# Patient Record
Sex: Female | Born: 1980 | Race: White | Hispanic: No | Marital: Married | Smoking: Never smoker
Health system: Southern US, Community
[De-identification: ages and names within clinical notes are randomized; demographics above are authoritative.]

## PROBLEM LIST (undated history)

## (undated) DIAGNOSIS — F32A Depression, unspecified: Secondary | ICD-10-CM

## (undated) DIAGNOSIS — Z1589 Genetic susceptibility to other disease: Secondary | ICD-10-CM

## (undated) DIAGNOSIS — R112 Nausea with vomiting, unspecified: Secondary | ICD-10-CM

## (undated) DIAGNOSIS — O321XX0 Maternal care for breech presentation, not applicable or unspecified: Secondary | ICD-10-CM

## (undated) DIAGNOSIS — Z9889 Other specified postprocedural states: Secondary | ICD-10-CM

## (undated) DIAGNOSIS — F329 Major depressive disorder, single episode, unspecified: Secondary | ICD-10-CM

## (undated) DIAGNOSIS — K649 Unspecified hemorrhoids: Secondary | ICD-10-CM

## (undated) DIAGNOSIS — J45909 Unspecified asthma, uncomplicated: Secondary | ICD-10-CM

## (undated) DIAGNOSIS — Z8619 Personal history of other infectious and parasitic diseases: Secondary | ICD-10-CM

## (undated) DIAGNOSIS — F419 Anxiety disorder, unspecified: Secondary | ICD-10-CM

## (undated) DIAGNOSIS — Z803 Family history of malignant neoplasm of breast: Secondary | ICD-10-CM

## (undated) HISTORY — DX: Family history of malignant neoplasm of breast: Z80.3

## (undated) HISTORY — PX: WISDOM TOOTH EXTRACTION: SHX21

## (undated) HISTORY — PX: COLONOSCOPY: SHX174

## (undated) HISTORY — DX: Genetic susceptibility to other disease: Z15.89

## (undated) HISTORY — PX: MOLE REMOVAL: SHX2046

---

## 2000-08-06 HISTORY — PX: CYSTECTOMY: SUR359

## 2001-06-27 ENCOUNTER — Encounter: Payer: Self-pay | Admitting: Emergency Medicine

## 2001-06-28 ENCOUNTER — Inpatient Hospital Stay (HOSPITAL_COMMUNITY): Admission: EM | Admit: 2001-06-28 | Discharge: 2001-06-29 | Payer: Self-pay | Admitting: Emergency Medicine

## 2001-07-04 ENCOUNTER — Encounter: Payer: Self-pay | Admitting: Gastroenterology

## 2001-07-04 ENCOUNTER — Ambulatory Visit (HOSPITAL_COMMUNITY): Admission: RE | Admit: 2001-07-04 | Discharge: 2001-07-04 | Payer: Self-pay | Admitting: Gastroenterology

## 2001-10-13 ENCOUNTER — Ambulatory Visit (HOSPITAL_COMMUNITY): Admission: EM | Admit: 2001-10-13 | Discharge: 2001-10-13 | Payer: Self-pay | Admitting: General Surgery

## 2005-01-28 ENCOUNTER — Ambulatory Visit (HOSPITAL_COMMUNITY): Admission: RE | Admit: 2005-01-28 | Discharge: 2005-01-28 | Payer: Self-pay | Admitting: Family Medicine

## 2005-01-28 IMAGING — CT CT ABDOMEN W/ CM
1 of 4 series · 14 of 32 positions shown, 19 images · IV contrast (APPLIED)
Comparison: none

CLINICAL DATA: abdominal and pelvic pain
 ABDOMEN CT WITH CONTRAST:
TECHNIQUE: Multidetector CT imaging of the abdomen was performed following the standard protocol during bolus administration of intravenous contrast.
 Contrast:  125 cc Omnipaque 300
TECHNIQUE: Multidetector CT imaging of the pelvis was performed following the standard protocol during bolus administration of intravenous contrast.
 The appendix is seen on images # 50 through # 55 and is normal.  A small amount of free fluid is seen layering in the pelvis.  The uterus and ovaries are within normal limits.  The bladder is within normal limits.  Negative abnormal adenopathy.

[Series 2: abd_pel 5.0 b40f st · axial · 0.62mm/px · z∈[-464,-114]mm · 14 of 80 slices shown, 19 images]
[im 5/80  soft-tissue]
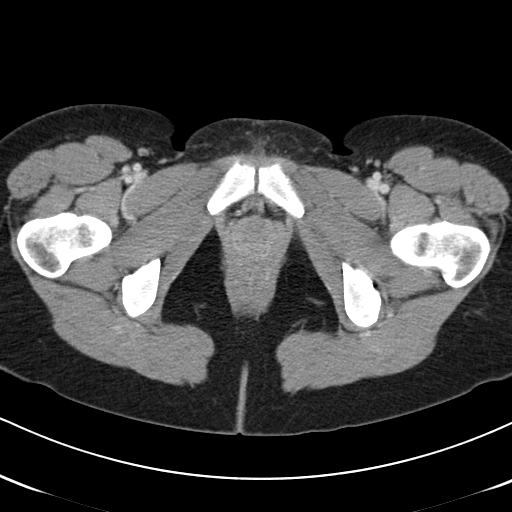
[im 5/80  bone]
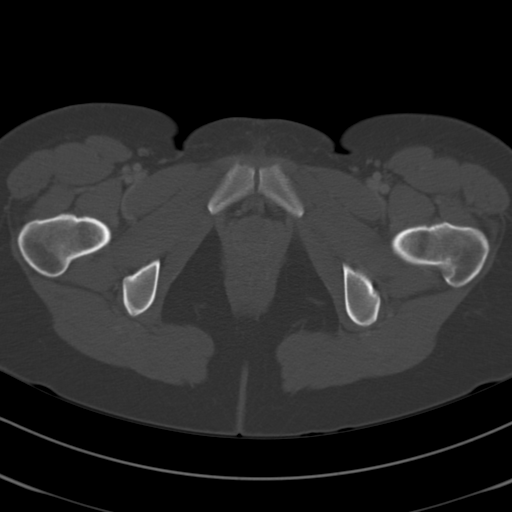
[im 9/80  soft-tissue]
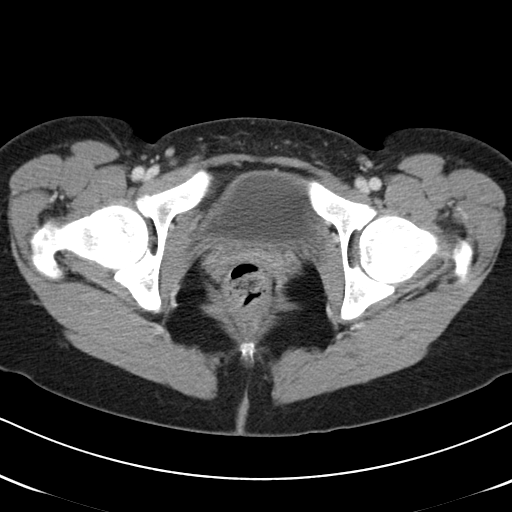
[im 18/80  soft-tissue]
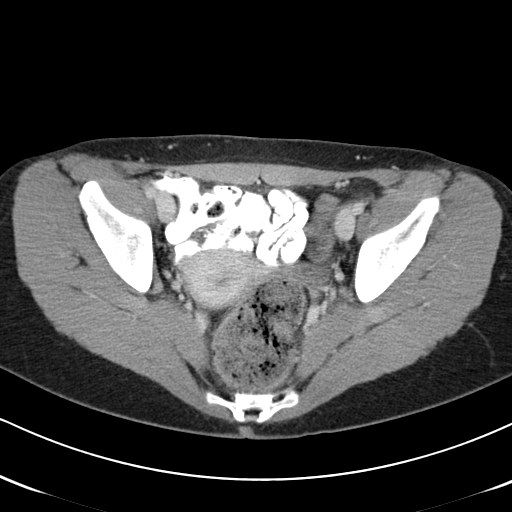
[im 22/80  soft-tissue]
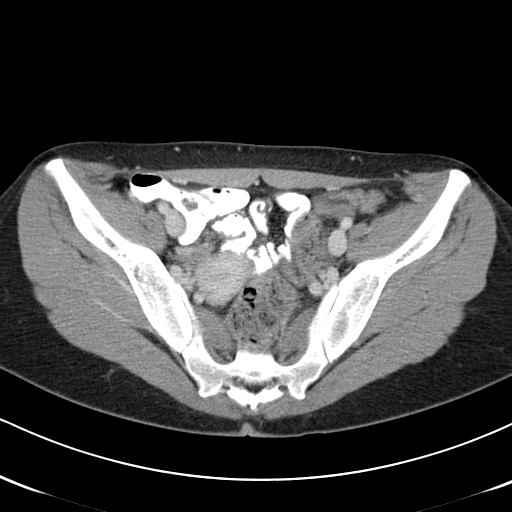
[im 27/80  soft-tissue]
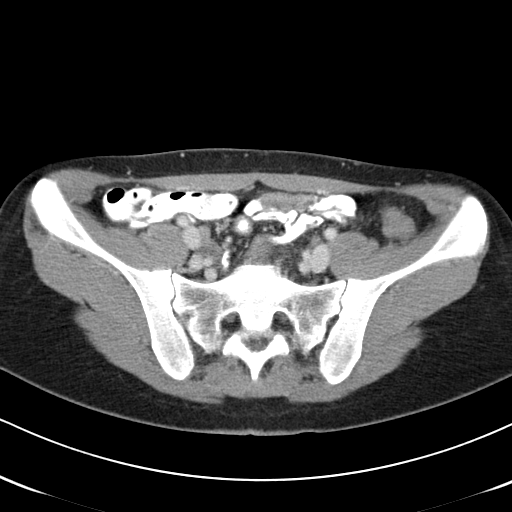
[im 36/80  soft-tissue]
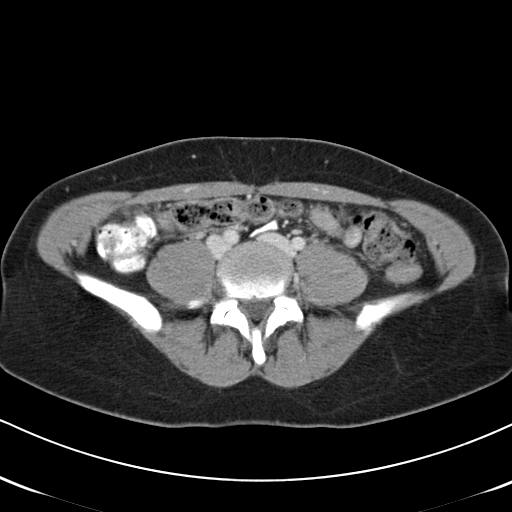
[im 40/80  soft-tissue]
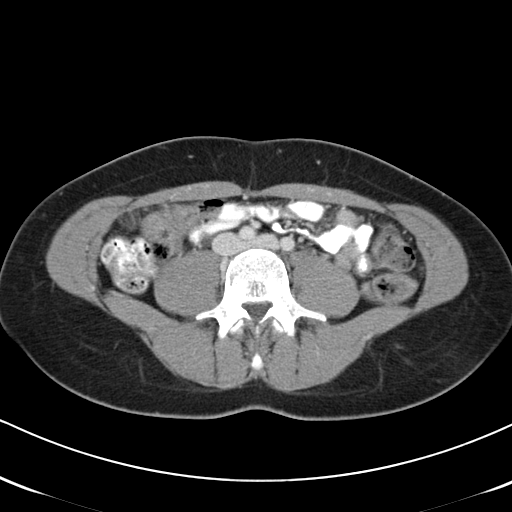
[im 44/80  soft-tissue]
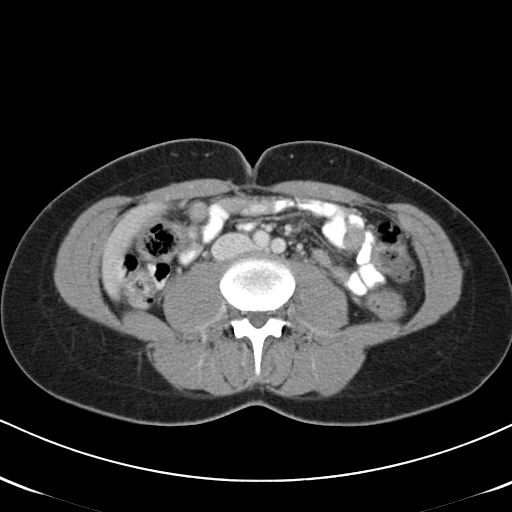
[im 53/80  soft-tissue]
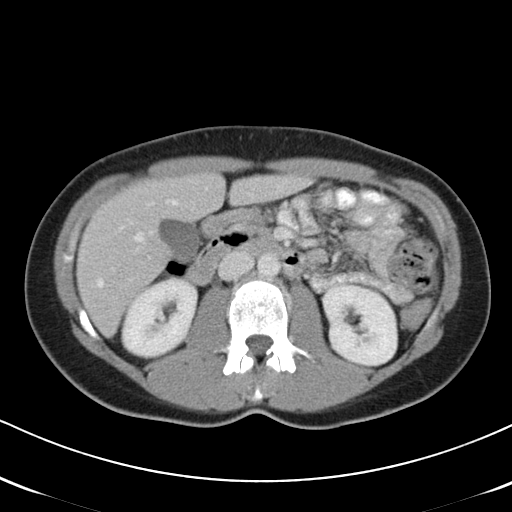
[im 53/80  bone]
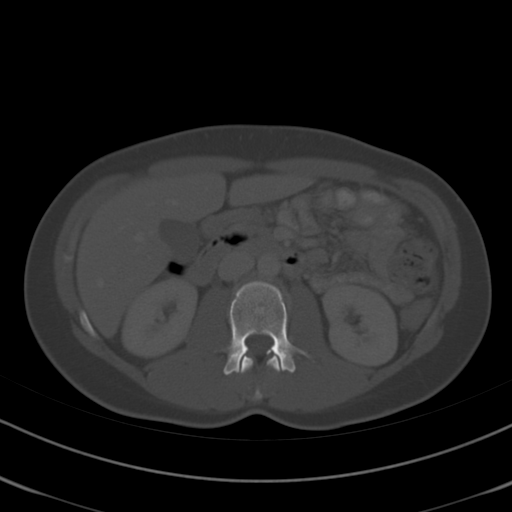
[im 58/80  soft-tissue]
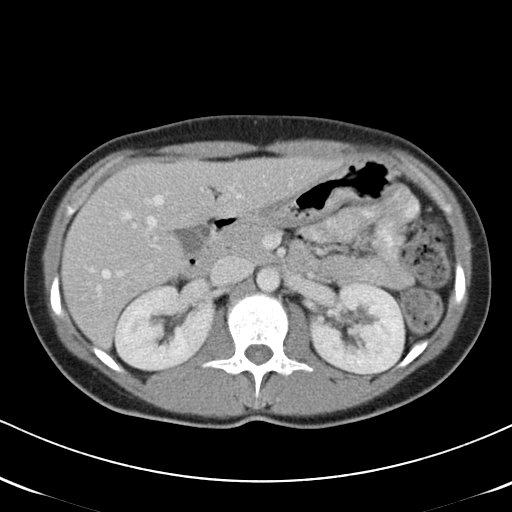
[im 62/80  soft-tissue]
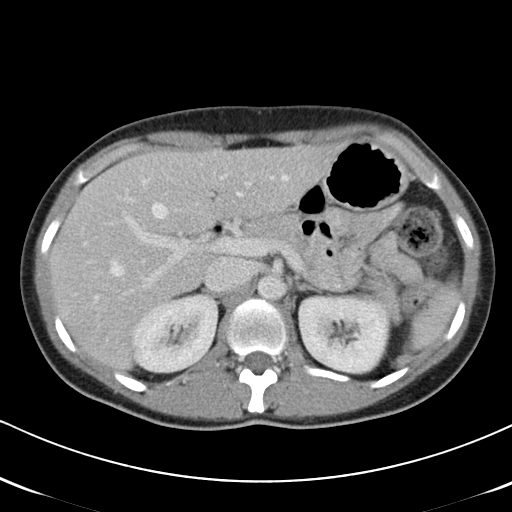
[im 62/80  lung]
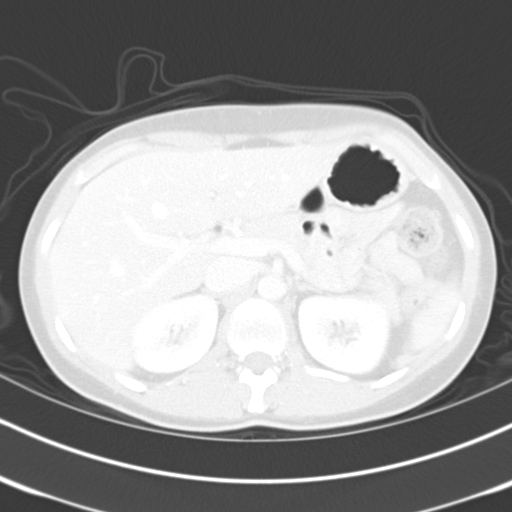
[im 66/80  lung]
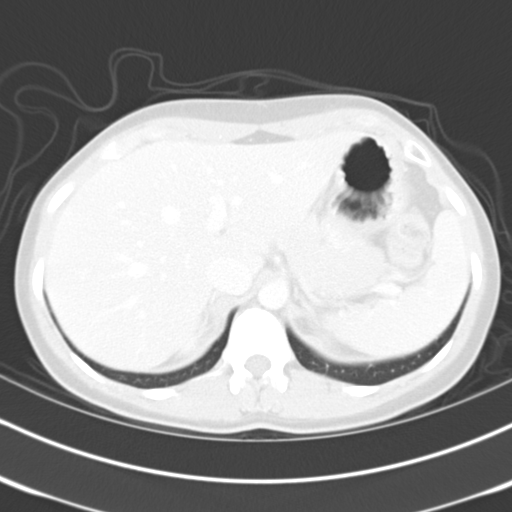
[im 71/80  soft-tissue]
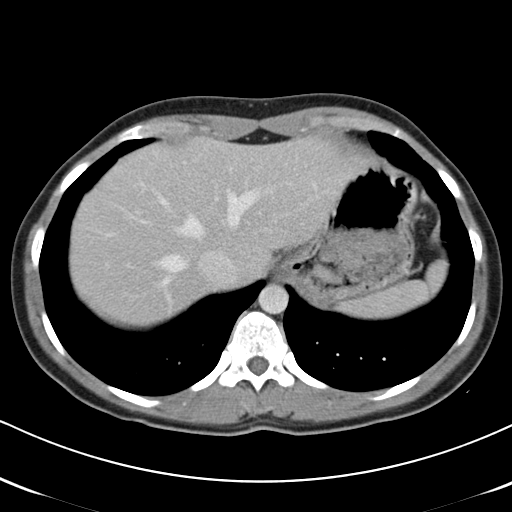
[im 71/80  lung]
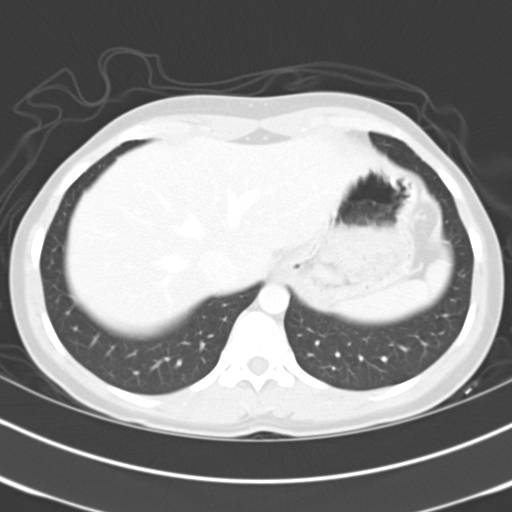
[im 75/80  soft-tissue]
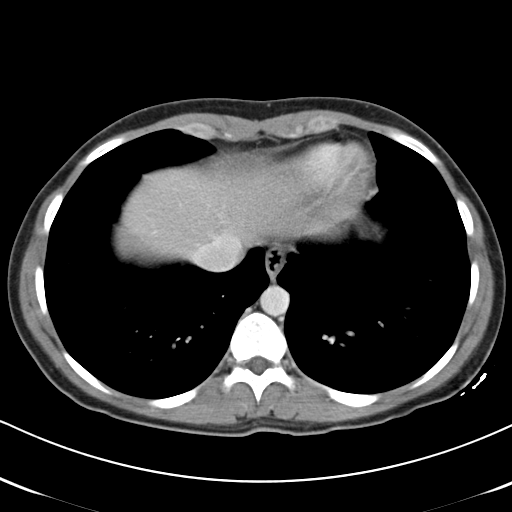
[im 75/80  lung]
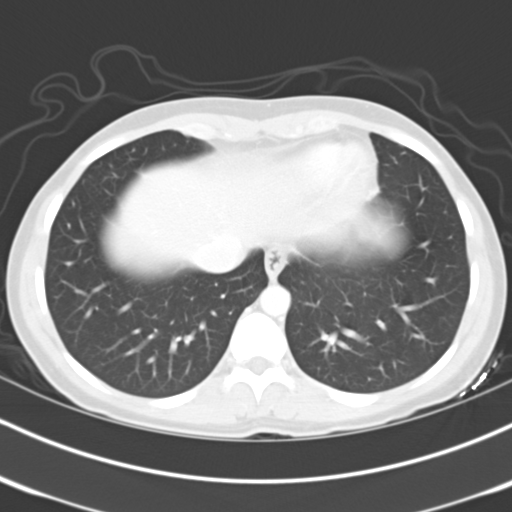

[14 of 32 positions shown; findings below may reference images not displayed]

FINDINGS: The liver, gallbladder, spleen, pancreas, adrenal glands, and kidneys are within normal limits.  Negative free fluid.  Negative abnormal adenopathy.  There is stranding in the fat adjacent to a segment of the transverse colon in the right side of the abdomen.  There is also a focal area of wall thickening.  See images # 41, # 42 and # 43.
IMPRESSION: Focal inflammation of a segment of the proximal transverse colon.  Consider infectious or inflammatory etiology such as infectious colitis, or inflammatory bowel disease.  Less likely would be ischemia and neoplasm.  Because there is a possibility of neoplasm, followup CT is recommended once symptoms resolve.   An additional consideration is gastroepiploic appendagitis. 
 PELVIS CT WITH CONTRAST:
IMPRESSION: Normal appendix.  Trace amount of free fluid which may simply be physiologic.

## 2005-02-13 ENCOUNTER — Other Ambulatory Visit: Admission: RE | Admit: 2005-02-13 | Discharge: 2005-02-13 | Payer: Self-pay | Admitting: *Deleted

## 2006-03-05 ENCOUNTER — Other Ambulatory Visit: Admission: RE | Admit: 2006-03-05 | Discharge: 2006-03-05 | Payer: Self-pay | Admitting: *Deleted

## 2006-10-18 IMAGING — CT CT HEAD W/O CM
1 of 2 series · 13 of 30 positions shown, 17 images · non-contrast
Comparison: NONE

CLINICAL DATA: Frequent headaches times 6 weeks. 

CT HEAD WITHOUT AND WITH INTRAVENOUS CONTRAST
TECHNIQUE: Axial 5-mm slice thicknesses were obtained through 
the head before the administration of intravenous contrast.  Axial 
5-mm thick slices were obtained through the posterior fossa and 
dual 8s were obtained through the remaining portion of the head 
following the administration of contrast.

[Series 2: without contrast · axial · non-contrast · 0.44mm/px · z∈[+96,+233]mm · 13 of 32 slices shown, 17 images]
[im 3/32  brain]
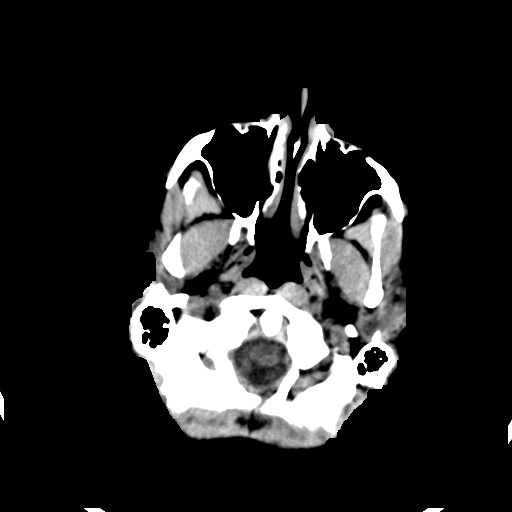
[im 3/32  bone]
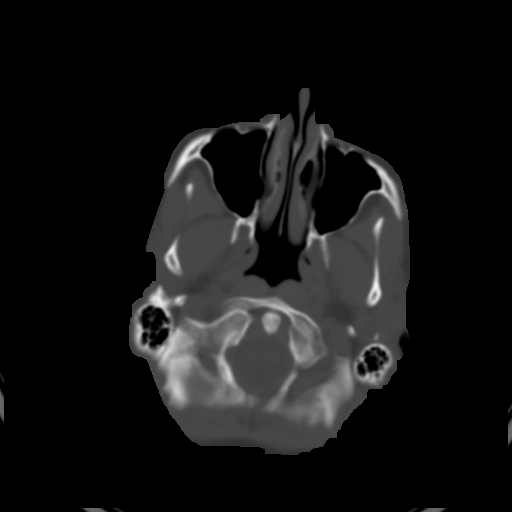
[im 5/32  brain]
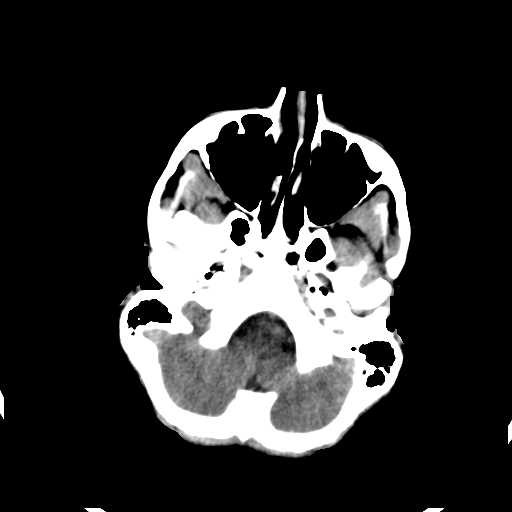
[im 7/32  brain]
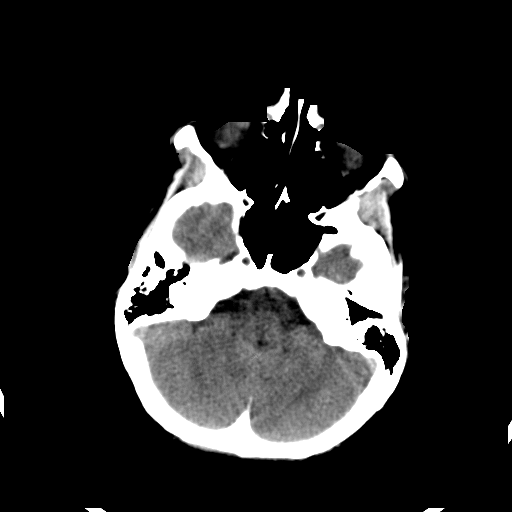
[im 9/32  brain]
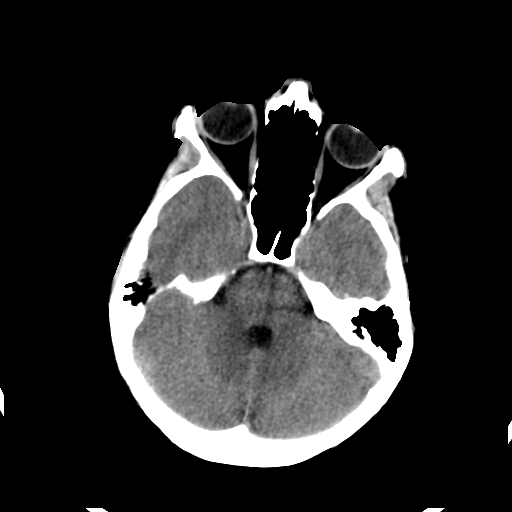
[im 12/32  brain]
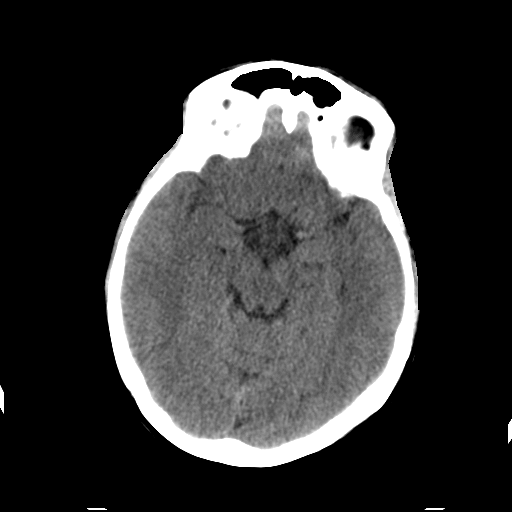
[im 12/32  bone]
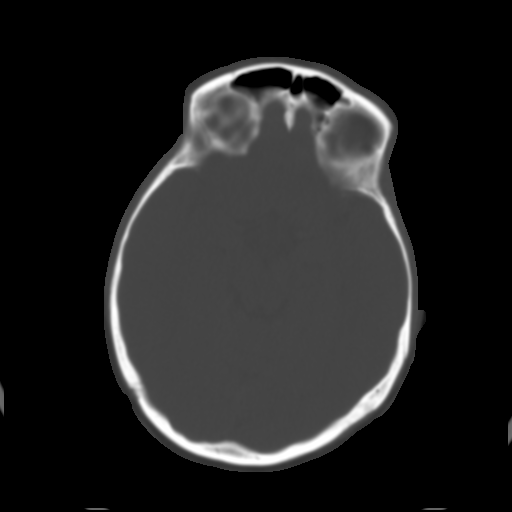
[im 14/32  brain]
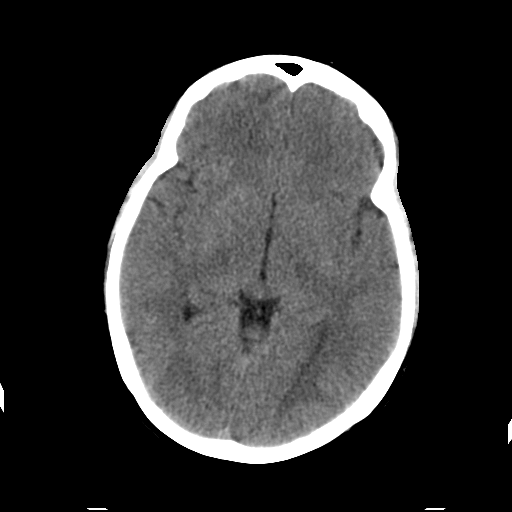
[im 16/32  brain]
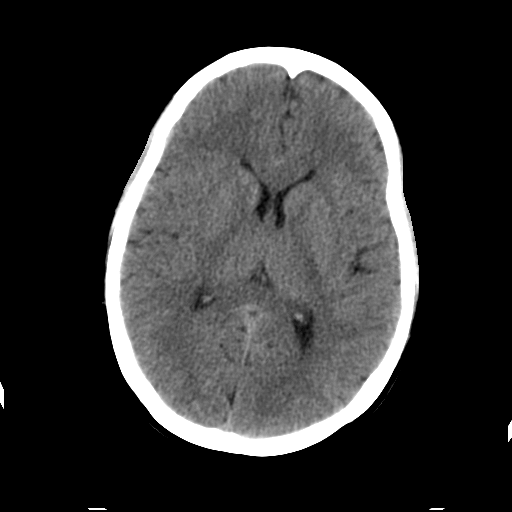
[im 18/32  brain]
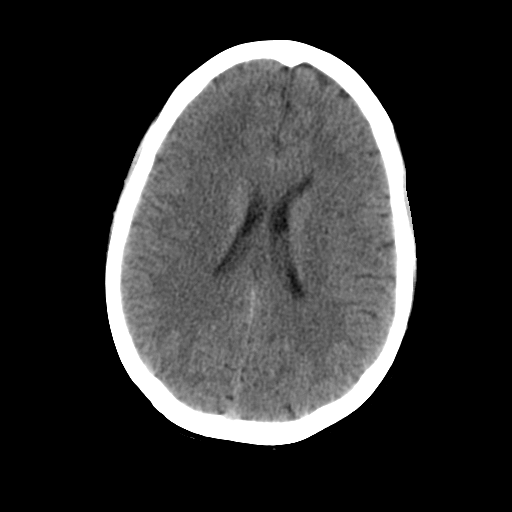
[im 20/32  brain]
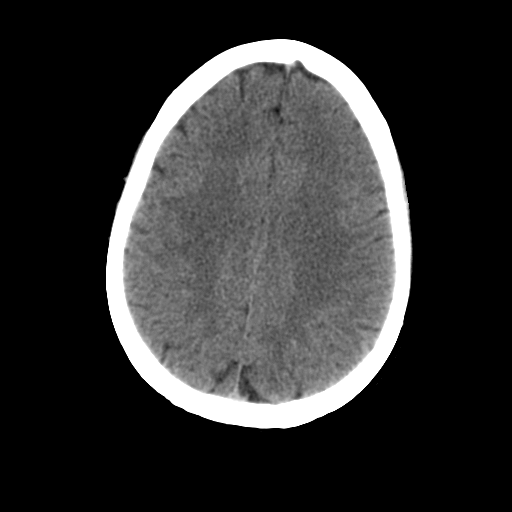
[im 20/32  bone]
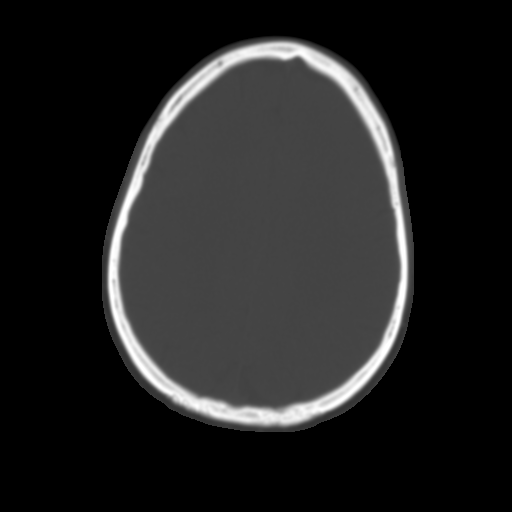
[im 23/32  brain]
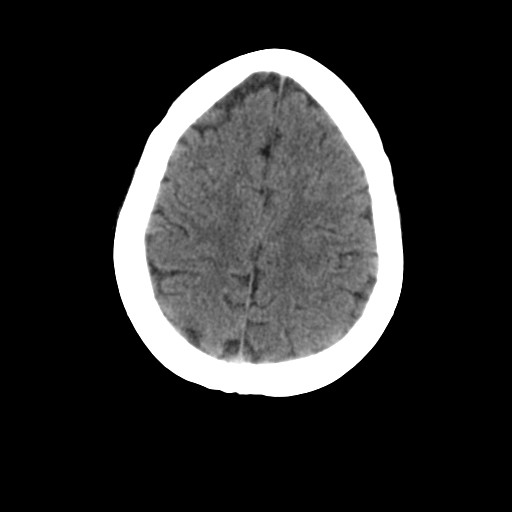
[im 25/32  brain]
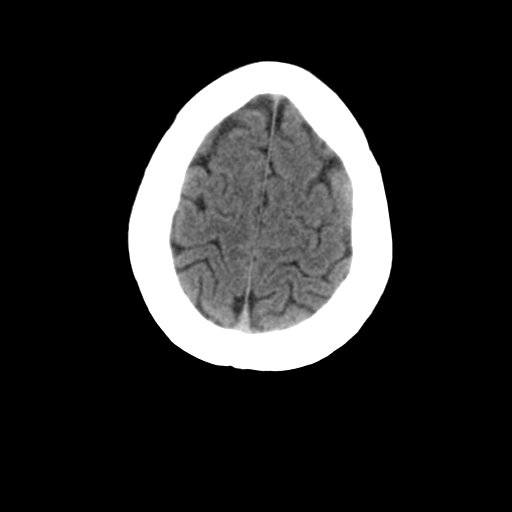
[im 27/32  brain]
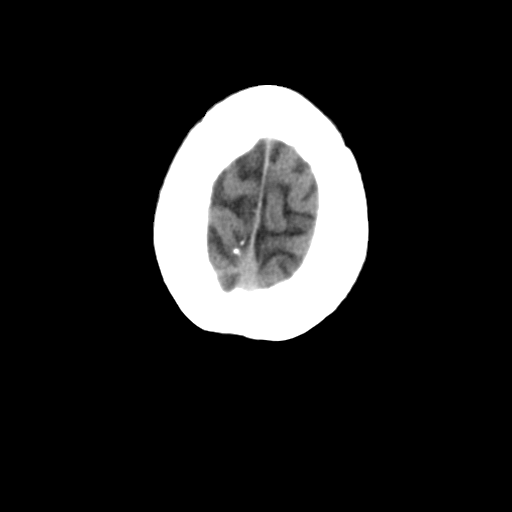
[im 29/32  brain]
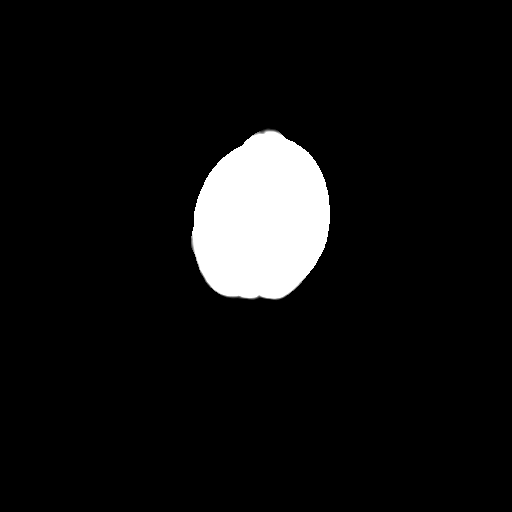
[im 29/32  bone]
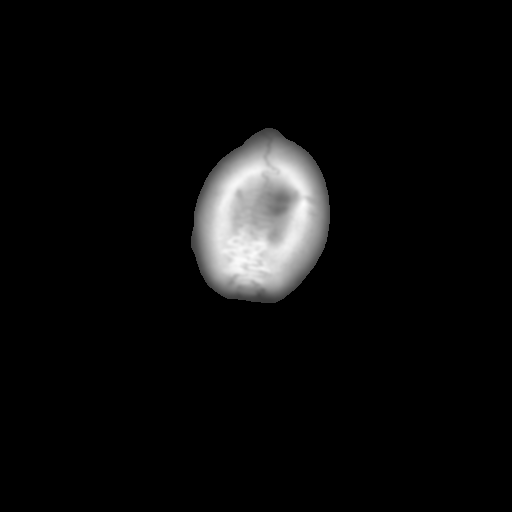

[13 of 30 positions shown; findings below may reference images not displayed]

FINDINGS: The visualized portions of the paranasal sinuses, 
orbits and mastoids are unremarkable in appearance. The fourth, 
third and both lateral ventricles are identified.  There is no 
evidence of midline shift or mass effect infratentorially or 
supratentorially.  No areas of abnormal radiolucency or 
radiodensity are identified. No areas of abnormal enhancement are 
noted following contrast infusion. 

electronically reviewed on [DATE] Dict Date: [DATE]  Tran 
Date: [DATE] DAS  JLM

## 2011-05-30 ENCOUNTER — Ambulatory Visit: Payer: Self-pay | Attending: Family Medicine | Admitting: Physical Therapy

## 2011-05-30 DIAGNOSIS — M2560 Stiffness of unspecified joint, not elsewhere classified: Secondary | ICD-10-CM | POA: Insufficient documentation

## 2011-05-30 DIAGNOSIS — M545 Low back pain, unspecified: Secondary | ICD-10-CM | POA: Insufficient documentation

## 2011-05-30 DIAGNOSIS — M542 Cervicalgia: Secondary | ICD-10-CM | POA: Insufficient documentation

## 2011-05-30 DIAGNOSIS — IMO0001 Reserved for inherently not codable concepts without codable children: Secondary | ICD-10-CM | POA: Insufficient documentation

## 2011-06-04 ENCOUNTER — Ambulatory Visit: Payer: Self-pay | Admitting: Physical Therapy

## 2011-06-05 ENCOUNTER — Encounter: Payer: Self-pay | Admitting: Physical Therapy

## 2011-06-05 ENCOUNTER — Ambulatory Visit: Payer: Self-pay | Admitting: Physical Therapy

## 2011-06-07 ENCOUNTER — Ambulatory Visit: Payer: BC Managed Care – PPO | Attending: Family Medicine | Admitting: Physical Therapy

## 2011-06-07 DIAGNOSIS — M545 Low back pain, unspecified: Secondary | ICD-10-CM | POA: Insufficient documentation

## 2011-06-07 DIAGNOSIS — M542 Cervicalgia: Secondary | ICD-10-CM | POA: Insufficient documentation

## 2011-06-07 DIAGNOSIS — M2560 Stiffness of unspecified joint, not elsewhere classified: Secondary | ICD-10-CM | POA: Insufficient documentation

## 2011-06-07 DIAGNOSIS — IMO0001 Reserved for inherently not codable concepts without codable children: Secondary | ICD-10-CM | POA: Insufficient documentation

## 2011-06-11 ENCOUNTER — Ambulatory Visit: Payer: BC Managed Care – PPO | Admitting: Physical Therapy

## 2011-06-12 ENCOUNTER — Ambulatory Visit: Payer: BC Managed Care – PPO | Admitting: Physical Therapy

## 2011-06-14 ENCOUNTER — Ambulatory Visit: Payer: BC Managed Care – PPO | Admitting: Physical Therapy

## 2011-06-18 ENCOUNTER — Ambulatory Visit: Payer: BC Managed Care – PPO | Admitting: Physical Therapy

## 2011-06-19 ENCOUNTER — Ambulatory Visit: Payer: BC Managed Care – PPO | Admitting: Physical Therapy

## 2011-06-21 ENCOUNTER — Ambulatory Visit: Payer: BC Managed Care – PPO | Admitting: Physical Therapy

## 2011-06-25 ENCOUNTER — Ambulatory Visit: Payer: BC Managed Care – PPO | Admitting: Physical Therapy

## 2011-06-26 ENCOUNTER — Ambulatory Visit: Payer: BC Managed Care – PPO | Admitting: Physical Therapy

## 2011-07-04 ENCOUNTER — Ambulatory Visit: Payer: BC Managed Care – PPO | Admitting: Physical Therapy

## 2011-07-06 ENCOUNTER — Encounter: Payer: BC Managed Care – PPO | Admitting: Physical Therapy

## 2011-07-09 ENCOUNTER — Ambulatory Visit: Payer: BC Managed Care – PPO | Attending: Family Medicine | Admitting: Physical Therapy

## 2011-07-09 DIAGNOSIS — IMO0001 Reserved for inherently not codable concepts without codable children: Secondary | ICD-10-CM | POA: Insufficient documentation

## 2011-07-09 DIAGNOSIS — M545 Low back pain, unspecified: Secondary | ICD-10-CM | POA: Insufficient documentation

## 2011-07-09 DIAGNOSIS — M542 Cervicalgia: Secondary | ICD-10-CM | POA: Insufficient documentation

## 2011-07-09 DIAGNOSIS — M2560 Stiffness of unspecified joint, not elsewhere classified: Secondary | ICD-10-CM | POA: Insufficient documentation

## 2011-07-11 ENCOUNTER — Ambulatory Visit: Payer: BC Managed Care – PPO | Admitting: Physical Therapy

## 2011-09-03 ENCOUNTER — Ambulatory Visit: Payer: BC Managed Care – PPO | Attending: Family Medicine | Admitting: Physical Therapy

## 2011-09-03 DIAGNOSIS — M545 Low back pain, unspecified: Secondary | ICD-10-CM | POA: Insufficient documentation

## 2011-09-03 DIAGNOSIS — M542 Cervicalgia: Secondary | ICD-10-CM | POA: Insufficient documentation

## 2011-09-03 DIAGNOSIS — IMO0001 Reserved for inherently not codable concepts without codable children: Secondary | ICD-10-CM | POA: Insufficient documentation

## 2011-09-03 DIAGNOSIS — M2560 Stiffness of unspecified joint, not elsewhere classified: Secondary | ICD-10-CM | POA: Insufficient documentation

## 2011-09-06 ENCOUNTER — Ambulatory Visit: Payer: BC Managed Care – PPO | Admitting: Physical Therapy

## 2011-09-11 ENCOUNTER — Ambulatory Visit: Payer: BC Managed Care – PPO | Attending: Family Medicine | Admitting: Physical Therapy

## 2011-09-11 DIAGNOSIS — M2560 Stiffness of unspecified joint, not elsewhere classified: Secondary | ICD-10-CM | POA: Insufficient documentation

## 2011-09-11 DIAGNOSIS — M542 Cervicalgia: Secondary | ICD-10-CM | POA: Insufficient documentation

## 2011-09-11 DIAGNOSIS — M545 Low back pain, unspecified: Secondary | ICD-10-CM | POA: Insufficient documentation

## 2011-09-11 DIAGNOSIS — IMO0001 Reserved for inherently not codable concepts without codable children: Secondary | ICD-10-CM | POA: Insufficient documentation

## 2011-09-13 ENCOUNTER — Encounter: Payer: BC Managed Care – PPO | Admitting: Physical Therapy

## 2011-09-14 ENCOUNTER — Ambulatory Visit: Payer: BC Managed Care – PPO | Admitting: Physical Therapy

## 2011-09-18 ENCOUNTER — Ambulatory Visit: Payer: BC Managed Care – PPO | Admitting: Physical Therapy

## 2011-09-20 ENCOUNTER — Ambulatory Visit: Payer: BC Managed Care – PPO | Admitting: Physical Therapy

## 2011-09-25 ENCOUNTER — Ambulatory Visit: Payer: BC Managed Care – PPO | Admitting: Physical Therapy

## 2011-09-27 ENCOUNTER — Ambulatory Visit: Payer: BC Managed Care – PPO | Admitting: Physical Therapy

## 2012-02-06 ENCOUNTER — Other Ambulatory Visit: Payer: Self-pay | Admitting: Family Medicine

## 2012-02-06 ENCOUNTER — Other Ambulatory Visit (HOSPITAL_COMMUNITY)
Admission: RE | Admit: 2012-02-06 | Discharge: 2012-02-06 | Disposition: A | Discharge status: Home / Self Care | Payer: BC Managed Care – PPO | Source: Ambulatory Visit | Attending: Family Medicine | Admitting: Family Medicine

## 2012-02-06 DIAGNOSIS — Z1159 Encounter for screening for other viral diseases: Secondary | ICD-10-CM | POA: Insufficient documentation

## 2012-02-06 DIAGNOSIS — Z124 Encounter for screening for malignant neoplasm of cervix: Secondary | ICD-10-CM | POA: Insufficient documentation

## 2014-06-16 ENCOUNTER — Ambulatory Visit: Payer: BC Managed Care – PPO | Attending: Family Medicine

## 2014-06-16 DIAGNOSIS — R5381 Other malaise: Secondary | ICD-10-CM | POA: Diagnosis not present

## 2014-06-16 DIAGNOSIS — M542 Cervicalgia: Secondary | ICD-10-CM | POA: Insufficient documentation

## 2014-06-16 DIAGNOSIS — M797 Fibromyalgia: Secondary | ICD-10-CM | POA: Insufficient documentation

## 2014-06-18 ENCOUNTER — Ambulatory Visit: Payer: BC Managed Care – PPO

## 2014-06-18 DIAGNOSIS — M797 Fibromyalgia: Secondary | ICD-10-CM | POA: Diagnosis not present

## 2014-06-21 ENCOUNTER — Ambulatory Visit: Payer: BC Managed Care – PPO

## 2014-06-21 DIAGNOSIS — M797 Fibromyalgia: Secondary | ICD-10-CM | POA: Diagnosis not present

## 2014-06-23 ENCOUNTER — Ambulatory Visit: Payer: BC Managed Care – PPO | Admitting: Physical Therapy

## 2014-06-23 DIAGNOSIS — M797 Fibromyalgia: Secondary | ICD-10-CM | POA: Diagnosis not present

## 2014-06-28 ENCOUNTER — Ambulatory Visit: Payer: BC Managed Care – PPO

## 2014-06-28 DIAGNOSIS — M797 Fibromyalgia: Secondary | ICD-10-CM | POA: Diagnosis not present

## 2014-07-05 ENCOUNTER — Ambulatory Visit: Payer: BC Managed Care – PPO

## 2014-07-05 DIAGNOSIS — M797 Fibromyalgia: Secondary | ICD-10-CM | POA: Diagnosis not present

## 2014-07-07 ENCOUNTER — Ambulatory Visit: Payer: BC Managed Care – PPO | Attending: Family Medicine

## 2014-07-07 DIAGNOSIS — M542 Cervicalgia: Secondary | ICD-10-CM | POA: Insufficient documentation

## 2014-07-07 DIAGNOSIS — R5381 Other malaise: Secondary | ICD-10-CM | POA: Diagnosis not present

## 2014-07-07 DIAGNOSIS — M797 Fibromyalgia: Secondary | ICD-10-CM | POA: Insufficient documentation

## 2014-07-12 ENCOUNTER — Ambulatory Visit: Payer: BC Managed Care – PPO | Admitting: Physical Therapy

## 2014-07-12 DIAGNOSIS — M797 Fibromyalgia: Secondary | ICD-10-CM | POA: Diagnosis not present

## 2014-07-14 ENCOUNTER — Ambulatory Visit: Payer: BC Managed Care – PPO

## 2014-07-14 DIAGNOSIS — M797 Fibromyalgia: Secondary | ICD-10-CM | POA: Diagnosis not present

## 2014-07-19 ENCOUNTER — Ambulatory Visit: Payer: BC Managed Care – PPO | Admitting: Physical Therapy

## 2014-07-19 DIAGNOSIS — M797 Fibromyalgia: Secondary | ICD-10-CM | POA: Diagnosis not present

## 2014-07-21 ENCOUNTER — Ambulatory Visit: Payer: BC Managed Care – PPO

## 2014-07-21 DIAGNOSIS — M797 Fibromyalgia: Secondary | ICD-10-CM | POA: Diagnosis not present

## 2014-07-26 ENCOUNTER — Ambulatory Visit: Payer: BC Managed Care – PPO

## 2014-07-26 DIAGNOSIS — M797 Fibromyalgia: Secondary | ICD-10-CM | POA: Diagnosis not present

## 2014-07-28 ENCOUNTER — Ambulatory Visit: Payer: BC Managed Care – PPO

## 2014-07-28 DIAGNOSIS — M797 Fibromyalgia: Secondary | ICD-10-CM | POA: Diagnosis not present

## 2014-08-02 ENCOUNTER — Ambulatory Visit: Payer: BC Managed Care – PPO | Admitting: Physical Therapy

## 2014-08-02 DIAGNOSIS — M797 Fibromyalgia: Secondary | ICD-10-CM | POA: Diagnosis not present

## 2014-08-04 ENCOUNTER — Ambulatory Visit: Payer: BC Managed Care – PPO

## 2014-08-04 DIAGNOSIS — M797 Fibromyalgia: Secondary | ICD-10-CM | POA: Diagnosis not present

## 2015-07-06 LAB — OB RESULTS CONSOLE HEPATITIS B SURFACE ANTIGEN: Hepatitis B Surface Ag: NEGATIVE

## 2015-07-06 LAB — OB RESULTS CONSOLE ANTIBODY SCREEN: ANTIBODY SCREEN: NEGATIVE

## 2015-07-06 LAB — OB RESULTS CONSOLE ABO/RH: RH TYPE: NEGATIVE

## 2015-07-06 LAB — OB RESULTS CONSOLE GC/CHLAMYDIA
Chlamydia: NEGATIVE
GC PROBE AMP, GENITAL: NEGATIVE

## 2015-07-06 LAB — OB RESULTS CONSOLE RUBELLA ANTIBODY, IGM: Rubella: IMMUNE

## 2015-07-06 LAB — OB RESULTS CONSOLE HIV ANTIBODY (ROUTINE TESTING): HIV: NONREACTIVE

## 2015-07-06 LAB — OB RESULTS CONSOLE RPR: RPR: NONREACTIVE

## 2015-12-28 LAB — OB RESULTS CONSOLE GBS: GBS: NEGATIVE

## 2016-01-13 ENCOUNTER — Encounter (HOSPITAL_COMMUNITY): Payer: Self-pay

## 2016-01-16 ENCOUNTER — Other Ambulatory Visit: Payer: Self-pay | Admitting: Obstetrics & Gynecology

## 2016-01-17 ENCOUNTER — Encounter (HOSPITAL_COMMUNITY)
Admission: RE | Admit: 2016-01-17 | Discharge: 2016-01-17 | Disposition: A | Discharge status: Home / Self Care | Payer: BLUE CROSS/BLUE SHIELD | Source: Ambulatory Visit | Attending: Obstetrics & Gynecology | Admitting: Obstetrics & Gynecology

## 2016-01-17 ENCOUNTER — Encounter (INDEPENDENT_AMBULATORY_CARE_PROVIDER_SITE_OTHER): Payer: Self-pay

## 2016-01-17 HISTORY — DX: Unspecified asthma, uncomplicated: J45.909

## 2016-01-17 HISTORY — DX: Personal history of other infectious and parasitic diseases: Z86.19

## 2016-01-17 HISTORY — DX: Major depressive disorder, single episode, unspecified: F32.9

## 2016-01-17 HISTORY — DX: Depression, unspecified: F32.A

## 2016-01-17 LAB — TYPE AND SCREEN
ABO/RH(D): O NEG
Antibody Screen: NEGATIVE

## 2016-01-17 LAB — CBC
HEMATOCRIT: 35.6 % — AB (ref 36.0–46.0)
HEMOGLOBIN: 12.3 g/dL (ref 12.0–15.0)
MCH: 32.1 pg (ref 26.0–34.0)
MCHC: 34.6 g/dL (ref 30.0–36.0)
MCV: 93 fL (ref 78.0–100.0)
PLATELETS: 300 10*3/uL (ref 150–400)
RBC: 3.83 MIL/uL — ABNORMAL LOW (ref 3.87–5.11)
RDW: 14.2 % (ref 11.5–15.5)
WBC: 15 10*3/uL — ABNORMAL HIGH (ref 4.0–10.5)

## 2016-01-17 LAB — ABO/RH: ABO/RH(D): O NEG

## 2016-01-17 NOTE — Patient Instructions (Addendum)
20 Roe CoombsJennifer R Santoni  01/17/2016   Your procedure is scheduled on:  01/18/2016  [facility]30 [facility].  Pick up the phone at the desk and dial [phone].   Call this number if you have problems the morning of surgery: [phone]   Remember:   Do not eat food:After Midnight.  Do not drink clear liquids: 4 Hours before arrival.  Take these medicines the morning of surgery with A SIP OF WATER: Singulair, Please bring your inhaler with you.   Do not wear jewelry, make-up or nail polish.  Do not wear lotions, powders, or perfumes. You may wear deodorant.  Do not shave 48 hours prior to surgery.  Do not bring valuables to the hospital.  [facility] not   responsible for any belongings or valuables brought to the hospital.  Contacts, dentures or bridgework may not be worn into surgery.  Leave suitcase in the car. After surgery it may be brought to your room.  For patients admitted to the hospital, checkout time is 11:00 AM the day of              discharge.   Patients discharged the day of surgery will not be allowed to drive             home.  Name and phone number of your driver: na  Special Instructions:   Shower using CHG 2 nights before surgery and the night before surgery.  If you shower the day of surgery use CHG.  Use special wash - you have one bottle of CHG for all showers.  You should use approximately 1/3 of the bottle for each shower.   Please read over the following fact sheets that you were given:   Surgical Site Infection Prevention

## 2016-01-18 ENCOUNTER — Inpatient Hospital Stay (HOSPITAL_COMMUNITY): Payer: BLUE CROSS/BLUE SHIELD | Admitting: Anesthesiology

## 2016-01-18 ENCOUNTER — Encounter (HOSPITAL_COMMUNITY)
Admission: AD | Disposition: A | Discharge status: Home / Self Care | Payer: Self-pay | Source: Ambulatory Visit | Attending: Obstetrics & Gynecology

## 2016-01-18 ENCOUNTER — Encounter (HOSPITAL_COMMUNITY): Payer: Self-pay

## 2016-01-18 ENCOUNTER — Inpatient Hospital Stay (HOSPITAL_COMMUNITY)
Admission: AD | Admit: 2016-01-18 | Discharge: 2016-01-21 | DRG: 765 | Disposition: A | Discharge status: Home / Self Care | Payer: BLUE CROSS/BLUE SHIELD | Source: Ambulatory Visit | Attending: Obstetrics & Gynecology | Admitting: Obstetrics & Gynecology

## 2016-01-18 DIAGNOSIS — O26893 Other specified pregnancy related conditions, third trimester: Secondary | ICD-10-CM | POA: Diagnosis present

## 2016-01-18 DIAGNOSIS — Z6791 Unspecified blood type, Rh negative: Secondary | ICD-10-CM

## 2016-01-18 DIAGNOSIS — O321XX0 Maternal care for breech presentation, not applicable or unspecified: Principal | ICD-10-CM | POA: Diagnosis present

## 2016-01-18 DIAGNOSIS — O9081 Anemia of the puerperium: Secondary | ICD-10-CM | POA: Diagnosis not present

## 2016-01-18 DIAGNOSIS — F329 Major depressive disorder, single episode, unspecified: Secondary | ICD-10-CM | POA: Diagnosis present

## 2016-01-18 DIAGNOSIS — D62 Acute posthemorrhagic anemia: Secondary | ICD-10-CM | POA: Diagnosis not present

## 2016-01-18 DIAGNOSIS — J45909 Unspecified asthma, uncomplicated: Secondary | ICD-10-CM | POA: Diagnosis present

## 2016-01-18 DIAGNOSIS — Z8249 Family history of ischemic heart disease and other diseases of the circulatory system: Secondary | ICD-10-CM | POA: Diagnosis not present

## 2016-01-18 DIAGNOSIS — Z9889 Other specified postprocedural states: Secondary | ICD-10-CM

## 2016-01-18 DIAGNOSIS — O9952 Diseases of the respiratory system complicating childbirth: Secondary | ICD-10-CM | POA: Diagnosis present

## 2016-01-18 DIAGNOSIS — Z3A39 39 weeks gestation of pregnancy: Secondary | ICD-10-CM | POA: Diagnosis not present

## 2016-01-18 HISTORY — DX: Other specified postprocedural states: R11.2

## 2016-01-18 HISTORY — DX: Other specified postprocedural states: Z98.890

## 2016-01-18 LAB — RPR: RPR: NONREACTIVE

## 2016-01-18 SURGERY — Surgical Case
Anesthesia: Spinal

## 2016-01-18 MED ORDER — MONTELUKAST SODIUM 10 MG PO TABS
10.0000 mg | ORAL_TABLET | Freq: Every day | ORAL | Status: DC
  Administered 2016-01-19 – 2016-01-20 (×3): 10 mg via ORAL
  Filled 2016-01-18 (×4): qty 1

## 2016-01-18 MED ORDER — SOD CITRATE-CITRIC ACID 500-334 MG/5ML PO SOLN
ORAL | Status: AC
  Filled 2016-01-18: qty 15

## 2016-01-18 MED ORDER — LORATADINE 10 MG PO TABS
10.0000 mg | ORAL_TABLET | Freq: Every day | ORAL | Status: DC
  Administered 2016-01-19 – 2016-01-20 (×3): 10 mg via ORAL
  Filled 2016-01-18 (×4): qty 1

## 2016-01-18 MED ORDER — FLUTICASONE PROPIONATE 50 MCG/ACT NA SUSP
2.0000 | Freq: Every day | NASAL | Status: DC
  Administered 2016-01-19 – 2016-01-20 (×3): 2 via NASAL
  Filled 2016-01-18: qty 16

## 2016-01-18 MED ORDER — KETOROLAC TROMETHAMINE 30 MG/ML IJ SOLN
INTRAMUSCULAR | Status: AC
  Filled 2016-01-18: qty 1

## 2016-01-18 MED ORDER — MOMETASONE FURO-FORMOTEROL FUM 100-5 MCG/ACT IN AERO
2.0000 | INHALATION_SPRAY | Freq: Two times a day (BID) | RESPIRATORY_TRACT | Status: DC
  Administered 2016-01-19 – 2016-01-21 (×6): 2 via RESPIRATORY_TRACT
  Filled 2016-01-18: qty 8.8

## 2016-01-18 MED ORDER — ONDANSETRON HCL 4 MG/2ML IJ SOLN
INTRAMUSCULAR | Status: AC
  Filled 2016-01-18: qty 2

## 2016-01-18 MED ORDER — WITCH HAZEL-GLYCERIN EX PADS: 1.0000 "application " | MEDICATED_PAD | CUTANEOUS | Status: DC | PRN

## 2016-01-18 MED ORDER — MORPHINE SULFATE (PF) 0.5 MG/ML IJ SOLN
INTRAMUSCULAR | Status: AC
  Filled 2016-01-18: qty 10

## 2016-01-18 MED ORDER — BUPIVACAINE HCL 0.25 % IJ SOLN
INTRAMUSCULAR | Status: DC | PRN
  Administered 2016-01-18: 10 mL

## 2016-01-18 MED ORDER — ZOLPIDEM TARTRATE 5 MG PO TABS: 5.0000 mg | ORAL_TABLET | Freq: Every evening | ORAL | Status: DC | PRN

## 2016-01-18 MED ORDER — LACTATED RINGERS IV SOLN
INTRAVENOUS | Status: DC | PRN
  Administered 2016-01-18: 15:00:00 via INTRAVENOUS

## 2016-01-18 MED ORDER — PHENYLEPHRINE 40 MCG/ML (10ML) SYRINGE FOR IV PUSH (FOR BLOOD PRESSURE SUPPORT)
PREFILLED_SYRINGE | INTRAVENOUS | Status: AC
  Filled 2016-01-18: qty 10

## 2016-01-18 MED ORDER — NALOXONE HCL 0.4 MG/ML IJ SOLN: 0.4000 mg | INTRAMUSCULAR | Status: DC | PRN

## 2016-01-18 MED ORDER — TETANUS-DIPHTH-ACELL PERTUSSIS 5-2.5-18.5 LF-MCG/0.5 IM SUSP: 0.5000 mL | Freq: Once | INTRAMUSCULAR | Status: DC

## 2016-01-18 MED ORDER — PROMETHAZINE HCL 25 MG/ML IJ SOLN: 6.2500 mg | INTRAMUSCULAR | Status: DC | PRN

## 2016-01-18 MED ORDER — OXYTOCIN 10 UNIT/ML IJ SOLN
INTRAMUSCULAR | Status: AC
  Filled 2016-01-18: qty 4

## 2016-01-18 MED ORDER — SENNOSIDES-DOCUSATE SODIUM 8.6-50 MG PO TABS
2.0000 | ORAL_TABLET | ORAL | Status: DC
  Administered 2016-01-18 – 2016-01-21 (×3): 2 via ORAL
  Filled 2016-01-18 (×3): qty 2

## 2016-01-18 MED ORDER — SCOPOLAMINE 1 MG/3DAYS TD PT72
1.0000 | MEDICATED_PATCH | Freq: Once | TRANSDERMAL | Status: DC
  Administered 2016-01-18: 1.5 mg via TRANSDERMAL

## 2016-01-18 MED ORDER — MEPERIDINE HCL 25 MG/ML IJ SOLN: 6.2500 mg | INTRAMUSCULAR | Status: DC | PRN

## 2016-01-18 MED ORDER — MENTHOL 3 MG MT LOZG: 1.0000 | LOZENGE | OROMUCOSAL | Status: DC | PRN

## 2016-01-18 MED ORDER — BUPIVACAINE IN DEXTROSE 0.75-8.25 % IT SOLN
INTRATHECAL | Status: DC | PRN
  Administered 2016-01-18: 1.6 mL via INTRATHECAL

## 2016-01-18 MED ORDER — FENTANYL CITRATE (PF) 100 MCG/2ML IJ SOLN
INTRAMUSCULAR | Status: AC
  Filled 2016-01-18: qty 2

## 2016-01-18 MED ORDER — ONDANSETRON HCL 4 MG/2ML IJ SOLN: 4.0000 mg | Freq: Three times a day (TID) | INTRAMUSCULAR | Status: DC | PRN

## 2016-01-18 MED ORDER — ACETAMINOPHEN 325 MG PO TABS
650.0000 mg | ORAL_TABLET | ORAL | Status: DC | PRN
  Administered 2016-01-19 – 2016-01-21 (×8): 650 mg via ORAL
  Filled 2016-01-18 (×8): qty 2

## 2016-01-18 MED ORDER — MAGNESIUM HYDROXIDE 400 MG/5ML PO SUSP: 30.0000 mL | ORAL | Status: DC | PRN

## 2016-01-18 MED ORDER — NALBUPHINE HCL 10 MG/ML IJ SOLN: 5.0000 mg | INTRAMUSCULAR | Status: DC | PRN

## 2016-01-18 MED ORDER — DEXAMETHASONE SODIUM PHOSPHATE 4 MG/ML IJ SOLN
INTRAMUSCULAR | Status: AC
  Filled 2016-01-18: qty 1

## 2016-01-18 MED ORDER — FENTANYL CITRATE (PF) 100 MCG/2ML IJ SOLN
25.0000 ug | INTRAMUSCULAR | Status: DC | PRN
  Administered 2016-01-18: 25 ug via INTRAVENOUS

## 2016-01-18 MED ORDER — EPHEDRINE SULFATE 50 MG/ML IJ SOLN
INTRAMUSCULAR | Status: DC | PRN
  Administered 2016-01-18 (×2): 10 mg via INTRAVENOUS

## 2016-01-18 MED ORDER — NALBUPHINE HCL 10 MG/ML IJ SOLN: 5.0000 mg | Freq: Once | INTRAMUSCULAR | Status: DC | PRN

## 2016-01-18 MED ORDER — OXYCODONE HCL 5 MG PO TABS
5.0000 mg | ORAL_TABLET | ORAL | Status: DC | PRN
  Administered 2016-01-19 (×2): 5 mg via ORAL
  Filled 2016-01-18 (×2): qty 1

## 2016-01-18 MED ORDER — DIBUCAINE 1 % RE OINT: 1.0000 "application " | TOPICAL_OINTMENT | RECTAL | Status: DC | PRN

## 2016-01-18 MED ORDER — METOCLOPRAMIDE HCL 5 MG/ML IJ SOLN
INTRAMUSCULAR | Status: DC | PRN
  Administered 2016-01-18: 10 mg via INTRAVENOUS

## 2016-01-18 MED ORDER — ACETAMINOPHEN 500 MG PO TABS
1000.0000 mg | ORAL_TABLET | Freq: Four times a day (QID) | ORAL | Status: AC
  Administered 2016-01-18 – 2016-01-19 (×3): 1000 mg via ORAL
  Filled 2016-01-18 (×3): qty 2

## 2016-01-18 MED ORDER — PHENYLEPHRINE HCL 10 MG/ML IJ SOLN
INTRAMUSCULAR | Status: DC | PRN
  Administered 2016-01-18 (×2): 120 ug via INTRAVENOUS
  Administered 2016-01-18: 40 ug via INTRAVENOUS
  Administered 2016-01-18 (×3): 80 ug via INTRAVENOUS

## 2016-01-18 MED ORDER — SOD CITRATE-CITRIC ACID 500-334 MG/5ML PO SOLN
30.0000 mL | Freq: Once | ORAL | Status: AC
  Administered 2016-01-18: 30 mL via ORAL

## 2016-01-18 MED ORDER — LACTATED RINGERS IV SOLN
INTRAVENOUS | Status: DC
  Administered 2016-01-18: 23:00:00 via INTRAVENOUS

## 2016-01-18 MED ORDER — KETOROLAC TROMETHAMINE 30 MG/ML IJ SOLN
30.0000 mg | Freq: Four times a day (QID) | INTRAMUSCULAR | Status: DC | PRN
  Administered 2016-01-18: 30 mg via INTRAMUSCULAR

## 2016-01-18 MED ORDER — SIMETHICONE 80 MG PO CHEW: 80.0000 mg | CHEWABLE_TABLET | ORAL | Status: DC | PRN

## 2016-01-18 MED ORDER — SIMETHICONE 80 MG PO CHEW
80.0000 mg | CHEWABLE_TABLET | ORAL | Status: DC
  Administered 2016-01-18 – 2016-01-21 (×3): 80 mg via ORAL
  Filled 2016-01-18 (×3): qty 1

## 2016-01-18 MED ORDER — KETOROLAC TROMETHAMINE 30 MG/ML IJ SOLN: 30.0000 mg | Freq: Four times a day (QID) | INTRAMUSCULAR | Status: DC | PRN

## 2016-01-18 MED ORDER — MORPHINE SULFATE (PF) 0.5 MG/ML IJ SOLN
INTRAMUSCULAR | Status: DC | PRN
  Administered 2016-01-18: .2 mg via INTRATHECAL

## 2016-01-18 MED ORDER — OXYTOCIN 40 UNITS IN LACTATED RINGERS INFUSION - SIMPLE MED: 2.5000 [IU]/h | INTRAVENOUS | Status: DC

## 2016-01-18 MED ORDER — SCOPOLAMINE 1 MG/3DAYS TD PT72
MEDICATED_PATCH | TRANSDERMAL | Status: AC
  Filled 2016-01-18: qty 1

## 2016-01-18 MED ORDER — BUPIVACAINE HCL (PF) 0.25 % IJ SOLN
INTRAMUSCULAR | Status: AC
  Filled 2016-01-18: qty 20

## 2016-01-18 MED ORDER — OXYTOCIN 40 UNITS IN LACTATED RINGERS INFUSION - SIMPLE MED
INTRAVENOUS | Status: DC | PRN
  Administered 2016-01-18: 40 [IU] via INTRAVENOUS

## 2016-01-18 MED ORDER — PHENYLEPHRINE 8 MG IN D5W 100 ML (0.08MG/ML) PREMIX OPTIME
INJECTION | INTRAVENOUS | Status: AC
  Filled 2016-01-18: qty 100

## 2016-01-18 MED ORDER — IBUPROFEN 600 MG PO TABS
600.0000 mg | ORAL_TABLET | Freq: Four times a day (QID) | ORAL | Status: DC
  Administered 2016-01-18 – 2016-01-19 (×2): 600 mg via ORAL
  Filled 2016-01-18 (×2): qty 1

## 2016-01-18 MED ORDER — SIMETHICONE 80 MG PO CHEW
80.0000 mg | CHEWABLE_TABLET | Freq: Three times a day (TID) | ORAL | Status: DC
  Administered 2016-01-19 – 2016-01-21 (×8): 80 mg via ORAL
  Filled 2016-01-18 (×8): qty 1

## 2016-01-18 MED ORDER — ONDANSETRON HCL 4 MG/2ML IJ SOLN
INTRAMUSCULAR | Status: DC | PRN
  Administered 2016-01-18: 4 mg via INTRAVENOUS

## 2016-01-18 MED ORDER — CEFAZOLIN SODIUM-DEXTROSE 2-4 GM/100ML-% IV SOLN
2.0000 g | INTRAVENOUS | Status: AC
  Administered 2016-01-18: 2 g via INTRAVENOUS

## 2016-01-18 MED ORDER — NALOXONE HCL 2 MG/2ML IJ SOSY
1.0000 ug/kg/h | PREFILLED_SYRINGE | INTRAVENOUS | Status: DC | PRN
  Filled 2016-01-18: qty 2

## 2016-01-18 MED ORDER — PRENATAL MULTIVITAMIN CH
1.0000 | ORAL_TABLET | Freq: Every day | ORAL | Status: DC
  Administered 2016-01-19 – 2016-01-21 (×3): 1 via ORAL
  Filled 2016-01-18 (×3): qty 1

## 2016-01-18 MED ORDER — COCONUT OIL OIL
1.0000 "application " | TOPICAL_OIL | Status: DC | PRN
  Administered 2016-01-21: 1 via TOPICAL
  Filled 2016-01-18: qty 120

## 2016-01-18 MED ORDER — DIPHENHYDRAMINE HCL 25 MG PO CAPS: 25.0000 mg | ORAL_CAPSULE | Freq: Four times a day (QID) | ORAL | Status: DC | PRN

## 2016-01-18 MED ORDER — FENTANYL CITRATE (PF) 100 MCG/2ML IJ SOLN
INTRAMUSCULAR | Status: DC | PRN
  Administered 2016-01-18: 10 ug via INTRATHECAL

## 2016-01-18 MED ORDER — FERROUS SULFATE 325 (65 FE) MG PO TABS
325.0000 mg | ORAL_TABLET | Freq: Two times a day (BID) | ORAL | Status: DC
  Administered 2016-01-19: 325 mg via ORAL
  Filled 2016-01-18: qty 1

## 2016-01-18 MED ORDER — DIPHENHYDRAMINE HCL 50 MG/ML IJ SOLN: 12.5000 mg | INTRAMUSCULAR | Status: DC | PRN

## 2016-01-18 MED ORDER — DEXAMETHASONE SODIUM PHOSPHATE 4 MG/ML IJ SOLN
INTRAMUSCULAR | Status: DC | PRN
  Administered 2016-01-18: 4 mg via INTRAVENOUS

## 2016-01-18 MED ORDER — LACTATED RINGERS IV SOLN
INTRAVENOUS | Status: DC
  Administered 2016-01-18 (×3): via INTRAVENOUS

## 2016-01-18 MED ORDER — PHENYLEPHRINE 8 MG IN D5W 100 ML (0.08MG/ML) PREMIX OPTIME
INJECTION | INTRAVENOUS | Status: DC | PRN
  Administered 2016-01-18: 60 ug/min via INTRAVENOUS

## 2016-01-18 MED ORDER — DIPHENHYDRAMINE HCL 25 MG PO CAPS: 25.0000 mg | ORAL_CAPSULE | ORAL | Status: DC | PRN

## 2016-01-18 MED ORDER — SODIUM CHLORIDE 0.9% FLUSH: 3.0000 mL | INTRAVENOUS | Status: DC | PRN

## 2016-01-18 MED ORDER — METOCLOPRAMIDE HCL 5 MG/ML IJ SOLN
INTRAMUSCULAR | Status: AC
  Filled 2016-01-18: qty 2

## 2016-01-18 SURGICAL SUPPLY — 42 items
ADH SKN CLS LQ APL DERMABOND (GAUZE/BANDAGES/DRESSINGS) ×1
CLAMP CORD UMBIL (MISCELLANEOUS) IMPLANT
CLOTH BEACON ORANGE TIMEOUT ST (SAFETY) ×3 IMPLANT
CONTAINER PREFILL 10% NBF 15ML (MISCELLANEOUS) IMPLANT
DERMABOND ADHESIVE PROPEN (GAUZE/BANDAGES/DRESSINGS) ×2
DERMABOND ADVANCED .7 DNX6 (GAUZE/BANDAGES/DRESSINGS) IMPLANT
DRSG OPSITE POSTOP 4X10 (GAUZE/BANDAGES/DRESSINGS) ×3 IMPLANT
DURAPREP 26ML APPLICATOR (WOUND CARE) ×3 IMPLANT
ELECT REM PT RETURN 9FT ADLT (ELECTROSURGICAL) ×3
ELECTRODE REM PT RTRN 9FT ADLT (ELECTROSURGICAL) ×1 IMPLANT
EXTRACTOR VACUUM M CUP 4 TUBE (SUCTIONS) IMPLANT
EXTRACTOR VACUUM M CUP 4' TUBE (SUCTIONS)
GLOVE BIO SURGEON STRL SZ 6.5 (GLOVE) ×2 IMPLANT
GLOVE BIO SURGEONS STRL SZ 6.5 (GLOVE) ×1
GLOVE BIOGEL PI IND STRL 7.0 (GLOVE) ×2 IMPLANT
GLOVE BIOGEL PI INDICATOR 7.0 (GLOVE) ×4
GOWN STRL REUS W/TWL LRG LVL3 (GOWN DISPOSABLE) ×6 IMPLANT
KIT ABG SYR 3ML LUER SLIP (SYRINGE) IMPLANT
LIQUID BAND (GAUZE/BANDAGES/DRESSINGS) IMPLANT
NDL HYPO 25X5/8 SAFETYGLIDE (NEEDLE) IMPLANT
NEEDLE HYPO 22GX1.5 SAFETY (NEEDLE) ×3 IMPLANT
NEEDLE HYPO 25X5/8 SAFETYGLIDE (NEEDLE) IMPLANT
PACK C SECTION WH (CUSTOM PROCEDURE TRAY) ×3 IMPLANT
PAD OB MATERNITY 4.3X12.25 (PERSONAL CARE ITEMS) ×3 IMPLANT
RTRCTR C-SECT PINK 25CM LRG (MISCELLANEOUS) ×3 IMPLANT
SUT MNCRL AB 3-0 PS2 27 (SUTURE) IMPLANT
SUT MON AB 4-0 PS1 27 (SUTURE) IMPLANT
SUT PLAIN 0 NONE (SUTURE) IMPLANT
SUT PLAIN 2 0 (SUTURE) ×3
SUT PLAIN ABS 2-0 CT1 27XMFL (SUTURE) ×1 IMPLANT
SUT VIC AB 0 CT1 27 (SUTURE) ×6
SUT VIC AB 0 CT1 27XBRD ANBCTR (SUTURE) ×2 IMPLANT
SUT VIC AB 0 CTX 36 (SUTURE) ×6
SUT VIC AB 0 CTX36XBRD ANBCTRL (SUTURE) ×2 IMPLANT
SUT VIC AB 2-0 CT1 27 (SUTURE) ×3
SUT VIC AB 2-0 CT1 TAPERPNT 27 (SUTURE) ×1 IMPLANT
SUT VIC AB 3-0 SH 27 (SUTURE)
SUT VIC AB 3-0 SH 27X BRD (SUTURE) IMPLANT
SUT VIC AB 4-0 PS2 27 (SUTURE) IMPLANT
SYR CONTROL 10ML LL (SYRINGE) ×3 IMPLANT
TOWEL OR 17X24 6PK STRL BLUE (TOWEL DISPOSABLE) ×3 IMPLANT
TRAY FOLEY CATH SILVER 14FR (SET/KITS/TRAYS/PACK) ×3 IMPLANT

## 2016-01-18 NOTE — Op Note (Signed)
Preoperative diagnosis: Intrauterine pregnancy at 39 weeks                                            Breech presentation  Post operative diagnosis: Same  Anesthesia: Spinal  Anesthesiologist: Dr. Collene SchlichterStephen E. Turk  Procedure: Primary low transverse cesarean section  Surgeon: Dr. Genia DelMarie-Lyne Shamyah Stantz  Assistant: Denton Meekolita Dawson   Estimated blood loss: 700 cc  Procedure:  After being informed of the planned procedure and possible complications including bleeding, infection, injury to other organs, informed consent is obtained. The patient is taken to OR #7 and given spinal anesthesia without complication. She is placed in the dorsal decubitus position with the pelvis tilted to the left. She is then prepped and draped in a sterile fashion. A Foley catheter is inserted in her bladder.  After assessing adequate level of anesthesia, we infiltrate the suprapubic area with 20 cc of Marcaine 0.25 and perform a Pfannenstiel incision which is brought down sharply to the fascia. The fascia is entered in a low transverse fashion. Linea alba is dissected. Peritoneum is entered in a midline fashion. An [name] retractor is easily positioned. Visceral peritoneum is entered in a low transverse fashion allowing us to safely retract bladder by developing a bladder flap.  The myometrium is then entered in a low transverse fashion; first with knife and then extended bluntly. Amniotic fluid is clear. We assist the birth of a female  infant in complete breech presentation. Mouth and nose are suctioned. The baby is delivered. The cord is clamped and sectioned. The baby is given to the neonatologist present in the room.  10 cc of blood is drawn from the umbilical vein.The placenta is allowed to deliver spontaneously. It is complete and the cord has 3 vessels. Uterine revision is negative.  We proceed with closure of the myometrium in 2 layers: First with a running locked suture of 0 Vicryl, then with a Lembert suture of  0 Vicryl imbricating the first one. Hemostasis is completed with cauterization on peritoneal edges.  Both paracolic gutters are cleaned. Both tubes and ovaries are assessed and normal. The pelvis is profusely irrigated with warm saline to confirm a satisfactory hemostasis.  Retractors and sponges are removed. Under fascia hemostasis is completed with cauterization.  The parietal peritoneum is closed with a running suture of Vicryl 2-0.  The fascia is then closed with 2 running sutures of 0 Vicryl meeting midline. The wound is irrigated with warm saline and hemostasis is completed with cauterization.  The adipose tissue is approximated with Plain 2-0. The skin is closed with a subcuticular suture of 3-0 Monocryl and Dermabond.  A Honeycomb dressing is applied.  Instrument and sponge count is complete x2. Estimated blood loss is 700 cc.  The procedure is well tolerated by the patient who is taken to recovery room in a well and stable condition.  female baby was born at 15:13 and received an Apgar of 8  at 1 minute and 9 at 5 minutes. Weight was pending.   Specimen: Placenta sent to [facility]   Kathleene Bergemann,MARIE-LYNE MD 6/14/20174:01 PM

## 2016-01-18 NOTE — Progress Notes (Signed)
Mom states she was taking medications for allergies nightly at home.  No home meds ordered.  Notified Dr. Seymour BarsLavoie and was told to "reorder all home meds."

## 2016-01-18 NOTE — Anesthesia Preprocedure Evaluation (Addendum)
Anesthesia Evaluation  Patient identified by MRN, date of birth, ID band Patient awake    Reviewed: Allergy & Precautions, NPO status , Patient's Chart, lab work & pertinent test results  History of Anesthesia Complications (+) PONV and history of anesthetic complications  Airway Mallampati: II  TM Distance: >3 FB Neck ROM: Full    Dental  (+) Teeth Intact, Dental Advisory Given   Pulmonary asthma ,    Pulmonary exam normal breath sounds clear to auscultation       Cardiovascular Exercise Tolerance: Good negative cardio ROS Normal cardiovascular exam Rhythm:Regular Rate:Normal     Neuro/Psych PSYCHIATRIC DISORDERS Depression negative neurological ROS     GI/Hepatic negative GI ROS, Neg liver ROS,   Endo/Other  negative endocrine ROSObesity   Renal/GU negative Renal ROS     Musculoskeletal negative musculoskeletal ROS (+)   Abdominal   Peds  Hematology negative hematology ROS (+) Plt 300k   Anesthesia Other Findings Day of surgery medications reviewed with the patient.  Reproductive/Obstetrics (+) Pregnancy                            Anesthesia Physical Anesthesia Plan  ASA: II  Anesthesia Plan: Spinal   Post-op Pain Management:    Induction:   Airway Management Planned:   Additional Equipment:   Intra-op Plan:   Post-operative Plan:   Informed Consent: I have reviewed the patients History and Physical, chart, labs and discussed the procedure including the risks, benefits and alternatives for the proposed anesthesia with the patient or authorized representative who has indicated his/her understanding and acceptance.   Dental advisory given  Plan Discussed with: CRNA, Anesthesiologist and Surgeon  Anesthesia Plan Comments: (Discussed risks and benefits of and differences between spinal and general. Discussed risks of spinal including headache, backache, failure, bleeding,  infection, and nerve damage. Patient consents to spinal. Questions answered. Coagulation studies and platelet count acceptable.)        Anesthesia Quick Evaluation

## 2016-01-18 NOTE — H&P (Signed)
Roe CoombsJennifer R Fitzmaurice is an [age] y.o. female G1P0  Term pregnancy with breech presentation  Pertinent Gynecological History:  Blood transfusions: none Sexually transmitted diseases: no past history Last pap: normal  OB History: G1P0   Menstrual History:  Patient's last menstrual period was 04/20/2015.    Past Medical History  Diagnosis Date  . Depression   . Asthma   . Hx of varicella   . PONV (postoperative nausea and vomiting)     "little nauseated"    Past Surgical History  Procedure Laterality Date  . Wisdom tooth extraction    . Cystectomy  2002    Family History  Problem Relation Age of Onset  . Cancer Maternal Grandmother     stomach  . Hypothyroidism Paternal Grandmother   . Atrial fibrillation Paternal Grandmother   . Angina Paternal Grandfather   . Hypertension Paternal Grandfather   . Hyperlipidemia Paternal Grandfather     Social History:  reports that she has never smoked. She has never used smokeless tobacco. She reports that she does not drink alcohol or use illicit drugs.  Allergies: No Known Allergies  Prescriptions prior to admission  Medication Sig Dispense Refill Last Dose  . acetaminophen (TYLENOL) 500 MG tablet Take 1,000 mg by mouth every 6 (six) hours as needed for moderate pain.   01/18/2016 at 0800  . budesonide (RHINOCORT ALLERGY) 32 MCG/ACT nasal spray Place 2 sprays into both nostrils daily.   01/17/2016 at Unknown time  . cetirizine (ZYRTEC) 10 MG tablet Take 10 mg by mouth daily.   01/17/2016 at Unknown time  . docusate sodium (COLACE) 100 MG capsule Take 100-200 mg by mouth daily as needed for mild constipation.   01/17/2016 at Unknown time  . doxylamine, Sleep, (UNISOM) 25 MG tablet Take 25 mg by mouth at bedtime as needed for sleep.   01/17/2016 at Unknown time  . lansoprazole (PREVACID) 15 MG capsule Take 15 mg by mouth daily at 12 noon.   01/18/2016 at 0900  . levalbuterol (XOPENEX HFA) 45 MCG/ACT inhaler Inhale 2 puffs into the lungs  every 6 (six) hours as needed for wheezing.   Past Month at Unknown time  . mometasone-formoterol (DULERA) 100-5 MCG/ACT AERO Inhale 2 puffs into the lungs 2 (two) times daily.   01/18/2016 at 1000  . montelukast (SINGULAIR) 10 MG tablet Take 10 mg by mouth at bedtime.   01/17/2016 at Unknown time  . NON FORMULARY Allergy shots once every 2 weeks   Past Week at Unknown time  . Prenatal Vit-Fe Fumarate-FA (PRENATAL MULTIVITAMIN) TABS tablet Take 3 tablets by mouth daily at 12 noon.   01/17/2016 at Unknown time  . hydrOXYzine (ATARAX/VISTARIL) 25 MG tablet Take 1-3 tablets by mouth daily as needed for itching.   3 Unknown at Unknown time    ROS Neg except a pubic sebaceous gland abcess.  Culture done.  Blood pressure 141/82, pulse 95, temperature 98.2 F (36.8 C), temperature source Oral, resp. rate 16, last menstrual period 04/20/2015, SpO2 100 %. Physical Exam   Breech presentation    Assessment/Plan: Term pregnancy with breech presentation for primary C/S.  Surgery and risks reviewed. Pubic sebaceous gland abcess.  Culture done.  Keileigh Vahey,MARIE-LYNE 01/18/2016, 2:29 PM

## 2016-01-18 NOTE — Anesthesia Procedure Notes (Signed)
Spinal Patient location during procedure: OR Staffing Anesthesiologist: Cecile HearingURK, Andi Mahaffy EDWARD Performed by: anesthesiologist  Preanesthetic Checklist Completed: patient identified, surgical consent, pre-op evaluation, timeout performed, IV checked, risks and benefits discussed and monitors and equipment checked Spinal Block Patient position: sitting Prep: site prepped and draped and DuraPrep Patient monitoring: continuous pulse ox and blood pressure Approach: midline Location: L3-4 Needle Needle type: Pencan  Needle gauge: 25 G Assessment Sensory level: T4 Additional Notes Functioning IV was confirmed and monitors were applied. Sterile prep and drape, including hand hygiene, mask and sterile gloves were used. The patient was positioned and the spine was prepped. The skin was anesthetized with lidocaine.  Free flow of clear CSF was obtained prior to injecting local anesthetic into the CSF.  The spinal needle aspirated freely following injection.  The needle was carefully withdrawn.  The patient tolerated the procedure well. Consent was obtained prior to procedure with all questions answered and concerns addressed. Risks including but not limited to bleeding, infection, nerve damage, paralysis, failed block, inadequate analgesia, allergic reaction, high spinal, itching and headache were discussed and the patient wished to proceed.   Arrie AranStephen Delton Stelle, MD

## 2016-01-18 NOTE — Transfer of Care (Signed)
Immediate Anesthesia Transfer of Care Note  Patient: Adrienne CoombsJennifer R Knoth  Procedure(s) Performed: Procedure(s) with comments: Primary CESAREAN SECTION (N/A) - EDD: 01/25/16  Patient Location: PACU  Anesthesia Type:Spinal  Level of Consciousness: awake, alert  and oriented  Airway & Oxygen Therapy: Patient Spontanous Breathing  Post-op Assessment: Report given to RN and Post -op Vital signs reviewed and stable  Post vital signs: Reviewed and stable  Last Vitals:  Filed Vitals:   01/18/16 1344  BP: 141/82  Pulse: 95  Temp: 36.8 C  Resp: 16    Last Pain: There were no vitals filed for this visit.    Patients Stated Pain Goal: 3 (01/18/16 1344)  Complications: No apparent anesthesia complications

## 2016-01-19 LAB — CBC
HEMATOCRIT: 28.1 % — AB (ref 36.0–46.0)
Hemoglobin: 9.7 g/dL — ABNORMAL LOW (ref 12.0–15.0)
MCH: 32 pg (ref 26.0–34.0)
MCHC: 34.5 g/dL (ref 30.0–36.0)
MCV: 92.7 fL (ref 78.0–100.0)
Platelets: 272 10*3/uL (ref 150–400)
RBC: 3.03 MIL/uL — ABNORMAL LOW (ref 3.87–5.11)
RDW: 14.2 % (ref 11.5–15.5)
WBC: 16.1 10*3/uL — ABNORMAL HIGH (ref 4.0–10.5)

## 2016-01-19 MED ORDER — OXYCODONE HCL 5 MG PO TABS
5.0000 mg | ORAL_TABLET | ORAL | Status: DC | PRN
  Administered 2016-01-19 – 2016-01-21 (×17): 5 mg via ORAL
  Filled 2016-01-19 (×17): qty 1

## 2016-01-19 MED ORDER — MAGNESIUM OXIDE 400 (241.3 MG) MG PO TABS
400.0000 mg | ORAL_TABLET | Freq: Every day | ORAL | Status: DC
  Administered 2016-01-20 – 2016-01-21 (×2): 400 mg via ORAL
  Filled 2016-01-19 (×4): qty 1

## 2016-01-19 MED ORDER — IBUPROFEN 800 MG PO TABS
800.0000 mg | ORAL_TABLET | Freq: Three times a day (TID) | ORAL | Status: DC
  Administered 2016-01-19 – 2016-01-21 (×7): 800 mg via ORAL
  Filled 2016-01-19 (×7): qty 1

## 2016-01-19 MED ORDER — FERROUS SULFATE 325 (65 FE) MG PO TABS
325.0000 mg | ORAL_TABLET | Freq: Every day | ORAL | Status: DC
  Administered 2016-01-20 – 2016-01-21 (×2): 325 mg via ORAL
  Filled 2016-01-19 (×2): qty 1

## 2016-01-19 NOTE — Progress Notes (Signed)
POSTOPERATIVE DAY # 1 S/P CS - scheduled for breech presentation  S:         Reports feeling really sore with incision pain today             Tolerating po intake / no nausea / no vomiting / + flatus / no BM             Bleeding is light             Pain controlled with motrin and percocet - wearing off prior to next dose             Up ad lib / ambulatory/ voiding QS  Newborn breast feeding  / female  O:  VS: BP 122/72 mmHg  Pulse 93  Temp(Src) 98.2 F (36.8 C) (Oral)  Resp 18  SpO2 96%  LMP 04/20/2015  Breastfeeding? Unknown   LABS:               Recent Labs  01/17/16 1145 01/19/16 0516  WBC 15.0* 16.1*  HGB 12.3 9.7*  PLT 300 272               Bloodtype: --/--/O NEG, O NEG (06/13 1145) / newborn RH negative - Rhophylac not indicated  Rubella: Immune (11/30 0000)                                             I&O: Intake/Output      06/14 0701 - 06/15 0700 06/15 0701 - 06/16 0700   P.O. 600    I.V. 3400    Total Intake 4000     Urine 3400 600   Blood 700    Total Output 4100 600   Net -100 -600                     Physical Exam:             Alert and Oriented X3  Lungs: Clear and unlabored  Heart: regular rate and rhythm / no mumurs  Abdomen: soft, non-tender, non-distended             Fundus: firm, non-tender, U-1             Dressing intact honeycomb              Incision:  approximated with suture / no erythema / no ecchymosis / no drainage  Perineum: intact  Lochia: light  Extremities: trace edema, no calf pain or tenderness / SCD in place  A:        POD # 1 S/P CS-breech             Mild ABL anemia            RH negative mother and infant  P:        Routine postoperative care              Rhophylac NOT indicated             Advance activity today with shower planned / adjust analgesia             Marlinda MikeBAILEY, TANYA CNM, MSN, [facility]ACNM 01/19/2016, 9:04 AM

## 2016-01-19 NOTE — Anesthesia Postprocedure Evaluation (Signed)
Anesthesia Post Note  Patient: Adrienne Williams  Procedure(s) Performed: Procedure(s) (LRB): Primary CESAREAN SECTION (N/A)  Patient location during evaluation: Mother Baby Anesthesia Type: Spinal Level of consciousness: awake, awake and alert, oriented and patient cooperative Pain management: pain level controlled Vital Signs Assessment: post-procedure vital signs reviewed and stable Respiratory status: spontaneous breathing, nonlabored ventilation and respiratory function stable Cardiovascular status: stable Postop Assessment: no headache, no backache, patient able to bend at knees and no signs of nausea or vomiting Anesthetic complications: no     Last Vitals:  Filed Vitals:   01/19/16 0030 01/19/16 0449  BP: 109/66 122/72  Pulse: 92 93  Temp: 37 C 36.8 C  Resp: 18 18    Last Pain:  Filed Vitals:   01/19/16 0738  PainSc: 6    Pain Goal: Patients Stated Pain Goal: 0 (01/18/16 1630)               Adrienne Williams

## 2016-01-19 NOTE — Progress Notes (Signed)
LCSW attempted to see MOB for consult: hx of depression/anxiety per her report. Unable to see due to MOB and baby being attended to by provider.  Will attempt to see later today and if not will see prior to DC. No concerns or psycho-social stresses at this time noted or seen in chart review.  Chart has been reviewed.  Will follow up.  Willetta York LCSW, MSW Clinical Social Work: [facility] [employer] [phone] 

## 2016-01-19 NOTE — Clinical Social Work Maternal (Signed)
CLINICAL SOCIAL WORK MATERNAL/CHILD NOTE  Patient Details  Name: Adrienne Williams MRN: [id number] Date of Birth: 03/31/1981  Date: 01/19/2016  Clinical Social Worker Initiating Note:  N , LCSW MSWDate/ Time Initiated: 01/19/16/1026   Child's Name: Adrienne Williams   Legal Guardian: Mother   Need for Interpreter: None   Date of Referral: 01/19/16   Reason for Referral: Other (Comment) (Self report: hx of anxiety/depression)   Referral Source: Physician   Address:    Phone number:     Household Members: Spouse   Natural Supports (not living in the home): Community, Extended Family, Immediate Family   Professional Supports:  None reported  Employment:Part-time   Type of Work:   did not disclose  Education: High school graduate   Financial Resources:Private Insurance   Other Resources:   will give education regarding PPD and mood stability along with support groups and community involvement  Cultural/Religious Considerations Which May Impact Care: None   Strengths: Ability to meet basic needs , Compliance with medical plan , Home prepared for child    Risk Factors/Current Problems: Mental Health Concerns  (anxiety/depression )   Cognitive State: Alert , Linear Thinking , Insightful , Goal Oriented    Mood/Affect: Happy , Calm    CSW Assessment:LCSW met with MOB and FOB at the bedside. Explained role and reason for consult. MOB was receptive and understanding and reports yes she does have hx of anxiety/depression, but well controlled. Reports she was doing really well and was not on any medications. Patient reports her mood has been stable throughout pregnancy. She does have a Psychiatrist who she is active and has an appointment with in 5 weeks. FOB is very supportive and attentive. Reports he has three weeks off from work on paternity leave in which he gets to be with mom and baby.   LCSW discussed risk  factors and education given to MOB regarding signs and symptoms of PPD along with mood and red flags for dad to watch for. Mom processed her attachment with baby and doing better than she expected. LCSW normalized feelings and discussed possibility of good and Williams days while adjusting to this new way of life and time with newborn. MOB reports she is very tired today, but excited about her new baby and family of three.   No concerns at this time for MOB and PPD. She will be given education information regarding PPD and support group information. Discussed positive self care with sleep, eating, and support along with excise once she is medically released and able to participate. MOB voiced no questions or concerns at this time. Will follow back up with MOB prior to DC to check in and give resources and answer any other questions that she may have or concerns.  CSW Plan/Description: No Further Intervention Required/No Barriers to Discharge  Education material regarding PPD Support groups in community offered as well.   ,  N, LCSW 01/19/2016, 10:39 AM                CLINICAL SOCIAL WORK MATERNAL/CHILD NOTE  Patient Details  Name: [name] MRN: [id number] Date of Birth: [birth year]  Date:  01/19/2016  Clinical Social Worker Initiating Note:  Lilly Cove, LCSW MSW Date/ Time Initiated:  01/19/16/1026     Child's Name:  Adrienne Williams   Legal Guardian:  Mother   Need for Interpreter:  None   Date of Referral:  01/19/16     Reason for Referral:  Other (Comment) (Self report: hx of anxiety/depression)   Referral Source:  Physician   Address:     Phone number:      Household Members:  Spouse   Natural Supports (not living in the home):  Community, Extended Family, Immediate Family   Professional Supports:   None reported  Employment: Part-time   Type of Work:   did not disclose  EducationConsulting civil engineer Resources:  Multimedia programmer   Other Resources:    will give education regarding PPD and mood stability along with support groups and community involvement  Cultural/Religious Considerations Which May Impact Care:  None   Strengths:  Ability to meet basic needs , Compliance with medical plan , Home prepared for child    Risk Factors/Current Problems:  Mental Health Concerns  (anxiety/depression )   Cognitive State:  Alert , Linear Thinking , Insightful , Goal Oriented    Mood/Affect:  Happy , Calm    CSW Assessment: LCSW met with MOB and FOB at the bedside. Explained role and reason for consult.  MOB was receptive and understanding and reports yes she does have hx of anxiety/depression, but well controlled.  Reports she was doing really well and was not on any medications. Patient reports her mood has been stable throughout pregnancy. She does have a Psychiatrist who she is active and has an appointment with in 5 weeks.  FOB is very supportive and attentive. Reports he has three weeks off from work on paternity leave in which he gets to be with mom and baby.   LCSW discussed risk factors  and education given to [facility] regarding signs and symptoms of PPD along with mood and red flags for dad to watch for. Mom processed her attachment with baby and doing better than she expected. LCSW normalized feelings and discussed possibility of good and Williams days while adjusting to this new way of life and time with newborn.  MOB reports she is very tired today, but excited about her new baby and family of three.   No concerns at this time for MOB and PPD. She will be given education information regarding PPD and support group information.  Discussed positive self care with sleep, eating, and support along with excise once she is medically released and able to participate. MOB voiced no questions or concerns at this time. Will follow back up with MOB prior to DC to check in and give resources and answer any other questions that she may have or concerns.  CSW Plan/Description:  No Further Intervention Required/No Barriers to Discharge  Education material regarding PPD Support groups in community offered as well.   Lilly Cove, LCSW 01/19/2016, 10:39 AM

## 2016-01-19 NOTE — Lactation Note (Signed)
This note was copied from a baby's chart. Lactation Consultation Note  Patient Name: Girl Adrienne KaufmannJennifer Byrnes ZOXWR'UToday's Date: 01/19/2016 Reason for consult: Follow-up assessment Baby at 25 hr of life. Parents are concerned because baby is sleepy. Mom has large pendulous breast that are compressible. Mom prefers football position. Demonstrated how to "tea cup" the areola and guide baby to breast. Baby has a wide open mouth and strong suck. Mom was complaining of pinching while baby was latched. Had her take baby off and the nipple appeared normal. Baby was able to suck on a gloved finger well, extended tongue past gum line, had nice peristolic tongue movement, and good lateralization of tongue. Encouraged parents to keep practicing and call for support as needed.   Maternal Data    Feeding Feeding Type: Breast Fed  LATCH Score/Interventions Latch: Repeated attempts needed to sustain latch, nipple held in mouth throughout feeding, stimulation needed to elicit sucking reflex. Intervention(s): Adjust position;Assist with latch;Breast compression  Audible Swallowing: A few with stimulation Intervention(s): Skin to skin;Hand expression;Alternate breast massage  Type of Nipple: Everted at rest and after stimulation  Comfort (Breast/Nipple): Filling, red/small blisters or bruises, mild/mod discomfort  Problem noted: Mild/Moderate discomfort Interventions (Mild/moderate discomfort): Hand expression  Hold (Positioning): Full assist, staff holds infant at breast Intervention(s): Support Pillows;Position options  LATCH Score: 5  Lactation Tools Discussed/Used     Consult Status Consult Status: Follow-up Date: 01/20/16 Follow-up type: In-patient    Rulon Eisenmengerlizabeth E Dontrail Blackwell 01/19/2016, 5:03 PM

## 2016-01-19 NOTE — Lactation Note (Signed)
This note was copied from a baby's chart. Lactation Consultation Note New mom c/s, nose very stuffy and swollen in appearance. Mom in some pain, breathing slightly fast d/dt pain. Mom has received pain medication. Mom holding baby swaddled. States last BF at 2230. Hasn't been interested since. Baby has been very spitty. FOB attentive at bedside. Mom has DEBP at home. Plans to BF for 8 weeks then return to work, and pump/bottle/breast feed. Mom encouraged to feed baby 8-12 times/24 hours and with feeding cues. Hand expression taught w/colostrum noted. Referred to Baby and Me Book in Breastfeeding section Pg. 22-23 for position options and Proper latch demonstration. Educated about newborn behavior, STS, I&O. Cluster feeding, supply and demand. WH/LC brochure given w/resources, support groups and LC services. Patient Name: Adrienne Maryelizabeth KaufmannJennifer Williams BJYNW'GToday's Date: 01/19/2016 Reason for consult: Initial assessment   Maternal Data Has patient been taught Hand Expression?: Yes Does the patient have breastfeeding experience prior to this delivery?: No  Feeding    LATCH Score/Interventions       Type of Nipple: Everted at rest and after stimulation  Comfort (Breast/Nipple): Soft / non-tender     Intervention(s): Breastfeeding basics reviewed;Support Pillows;Position options;Skin to skin     Lactation Tools Discussed/Used     Consult Status Consult Status: Follow-up Date: 01/19/16 Follow-up type: In-patient    Adrienne DancerCARVER, Adrienne Williams 01/19/2016, 3:54 AM

## 2016-01-19 NOTE — Anesthesia Postprocedure Evaluation (Signed)
Anesthesia Post Note  Patient: Adrienne Williams  Procedure(s) Performed: Procedure(s) (LRB): Primary CESAREAN SECTION (N/A)  Patient location during evaluation: PACU Anesthesia Type: Spinal Level of consciousness: oriented and awake and alert Pain management: pain level controlled Vital Signs Assessment: post-procedure vital signs reviewed and stable Respiratory status: spontaneous breathing, respiratory function stable and patient connected to nasal cannula oxygen Cardiovascular status: blood pressure returned to baseline and stable Postop Assessment: no headache, no backache, patient able to bend at knees, spinal receding, adequate PO intake and no signs of nausea or vomiting Anesthetic complications: no    Last Vitals:  Filed Vitals:   01/19/16 0030 01/19/16 0449  BP: 109/66 122/72  Pulse: 92 93  Temp: 37 C 36.8 C  Resp: 18 18    Last Pain:  Filed Vitals:   01/19/16 0613  PainSc: 5                  Cecile HearingStephen Edward Martice Doty

## 2016-01-19 NOTE — Addendum Note (Signed)
Addendum  created 01/19/16 0804 by Yolonda KidaAlison L Laurissa Cowper, CRNA   Modules edited: Clinical Notes   Clinical Notes:  File: [id number]

## 2016-01-20 LAB — AEROBIC CULTURE W GRAM STAIN (SUPERFICIAL SPECIMEN)

## 2016-01-20 NOTE — Lactation Note (Signed)
This note was copied from a baby's chart. Lactation Consultation Note Follow up visit at 51 hours of age. Baby has had 10 feedings since midnight last night.  MOm reports some minimal soreness on left nipple without trauma noted.  Baby is latched on right breast, but has stopped sucking.  Baby fell asleep and was placed on moms chest.  Babys arms are relaxed and baby is content, helped mom identify this as a good feeding.  MOm to call for assist as needed to make sure baby has wide open mouth with deep latch to prevent further soreness on left nipple.    Patient Name: Adrienne Maryelizabeth KaufmannJennifer Castrejon UXLKG'MToday's Date: 01/20/2016 Reason for consult: Follow-up assessment   Maternal Data    Feeding Feeding Type: Breast Fed Length of feed: 10 min  LATCH Score/Interventions                      Lactation Tools Discussed/Used     Consult Status Consult Status: Follow-up Date: 01/21/16 Follow-up type: In-patient    Adrienne Williams, Adrienne Williams 01/20/2016, 6:35 PM

## 2016-01-20 NOTE — Progress Notes (Signed)
Patient ID: Roe CoombsJennifer R Williams, female   DOB: 09-25-1980, 35 y.o.   MRN: [id number] POD # 1  Subjective: Pt reports feeling tired, but well, and sore / Pain controlled with ibuprofen and percocet Tolerating po/ Foley d/c'ed and voiding without problems/ No n/v/Flatus pos Activity: change activity as tolerated Bleeding is light Newborn info: Female Feeding: breast   Objective: VS: Blood pressure 119/75, pulse 95, temperature 98.4 F (36.9 C), temperature source Oral, resp. rate 18, last menstrual period 04/20/2015, SpO2 96 %, unknown if currently breastfeeding.    Intake/Output Summary (Last 24 hours) at 01/20/16 1032 Last data filed at 01/19/16 1211  Gross per 24 hour  Intake      0 ml  Output    350 ml  Net   -350 ml      Recent Labs  01/17/16 1145 01/19/16 0516  WBC 15.0* 16.1*  HGB 12.3 9.7*  HCT 35.6* 28.1*  PLT 300 272    Blood type: O NEG / Infant RH Neg Rubella: Immune (11/30 0000)    Physical Exam:  General: alert, cooperative and no distress CV: Regular rate and rhythm Resp: clear Abdomen: soft, nontender, normal bowel sounds Incision: Covered with Tegaderm and honeycomb dressing; well approximated. Uterine Fundus: firm, below umbilicus, nontender Lochia: minimal Ext: edema +1 and Homans sign is negative, no sign of DVT    A/P: POD # 1 G1P1001 S/P Primary C/Section d/t breech Doing well Continue routine post op orders   Signed: Demetrius RevelFISHER,Nashla Althoff K, MSN, [facility]NP 01/20/2016, 10:32 AM

## 2016-01-20 NOTE — Progress Notes (Signed)
Pt sleeping when I took over for [facility] RN on M[facility]. Pt had ambulated at 1500. Pt called out for Oxy IR at 1600. Had last dose at 1245. Was in another pt room. Took Oxy IR at 1615. Order was for q3hr. PRN.  Pt crying and writhing in pain and demanded to know why dose was not given at 3:45. I told her she was asleep when I rounded. I was in another pt room. Offered to call MD and ask for 10mg  oxy IR for pain levels of 7. Pt husband said he did not want me to do this. I instructed patient only she could rate her pain not anyone else. She then said she felt 10mg  oxy IR would be too much even q4hrs. I wrote on board when she could have pain meds and scheduled motrin. Asked pt to let me know if she wanted me to call for higher dose. She has kpad for incision.

## 2016-01-21 MED ORDER — MAGNESIUM OXIDE 400 (241.3 MG) MG PO TABS: 400.0000 mg | ORAL_TABLET | Freq: Every day | ORAL | Status: AC

## 2016-01-21 MED ORDER — CEFUROXIME AXETIL 500 MG PO TABS
500.0000 mg | ORAL_TABLET | Freq: Two times a day (BID) | ORAL | Status: DC
Start: 2016-01-21 — End: 2018-05-09

## 2016-01-21 MED ORDER — IBUPROFEN 800 MG PO TABS: 800.0000 mg | ORAL_TABLET | Freq: Three times a day (TID) | ORAL | Status: DC

## 2016-01-21 MED ORDER — OXYCODONE HCL 5 MG PO TABS
5.0000 mg | ORAL_TABLET | ORAL | Status: DC | PRN
Start: 2016-01-21 — End: 2018-05-09

## 2016-01-21 NOTE — Progress Notes (Signed)
POSTOPERATIVE DAY # 4 S/P scheduled CS breech   S:         Reports feeling sore             Tolerating po intake / no nausea / no vomiting / + flatus / no BM             Bleeding is light             Pain controlled with Motrin, Tylenol, Oxycodone             Up ad lib / ambulatory/ voiding QS  Newborn breast feeding   O:  VS: BP 116/76 mmHg  Pulse 87  Temp(Src) 98 F (36.7 C) (Oral)  Resp 18  SpO2 97%  LMP 04/20/2015  Breastfeeding? Unknown   LABS:               Recent Labs  01/19/16 0516  WBC 16.1*  HGB 9.7*  PLT 272                    Physical Exam:             Alert and Oriented X3  Lungs: Clear and unlabored  Heart: regular rate and rhythm / no mumurs  Abdomen: soft, non-tender, non-distended             Fundus: firm, non-tender, U-1             Dressing intact honeycomb              Incision:  approximated with suture / no erythema / no ecchymosis / no drainage  Perineum: labial abscess - (+) staph culture positive  Lochia: light  Extremities: 1+ pedal edema, no calf pain or tenderness, negative Homans  A:        POD # 3 S/P CS            Labial abscess  P:        Routine postoperative care              DC home             ABX course for abscess   Marlinda MikeBAILEY, Vernadette Stutsman CNM, MSN, [facility] 01/21/2016, 8:29 AM

## 2016-01-21 NOTE — Discharge Summary (Signed)
POSTOPERATIVE DISCHARGE SUMMARY:  Patient ID: Adrienne Williams MRN: [id number] DOB/AGE: Dec 16, 1980 34 y.o.  Admit date: 01/18/2016 Admission Diagnoses: breech presentation  Discharge date:  01/21/2016 Discharge Diagnoses: POD 3 s/p primary cesarean section for breech presentation  Prenatal history: G1P1001   EDC : 01/25/2016, by Last Menstrual Period  [facility]  Primary provider : Arita Missawson CNM Prenatal course complicated by breech  Prenatal Labs: ABO, Rh: --/--/O NEG, O NEG (06/13 1145) / Rhophylac given Antibody: NEG (06/13 1145) Rubella: Immune (11/30 0000)   RPR: Non Reactive (06/13 1145)  HBsAg: Negative (11/30 0000)  HIV: Non-reactive (11/30 0000)  GBS: Negative (05/24 0000)   Medical / Surgical History :  Past medical history:  Past Medical History  Diagnosis Date  . Depression   . Asthma   . Hx of varicella   . PONV (postoperative nausea and vomiting)     "little nauseated"  . Postpartum care following cesarean delivery (6/14) 01/18/2016    Past surgical history:  Past Surgical History  Procedure Laterality Date  . Wisdom tooth extraction    . Cystectomy  2002  . Cesarean section N/A 01/18/2016    Procedure: Primary CESAREAN SECTION;  Surgeon: Genia DelMarie-Lyne Lavoie, MD;  [facility];  Service: Obstetrics;  Laterality: N/A;  EDD: 01/25/16    Family History:  Family History  Problem Relation Age of Onset  . Cancer Maternal Grandmother     stomach  . Hypothyroidism Paternal Grandmother   . Atrial fibrillation Paternal Grandmother   . Angina Paternal Grandfather   . Hypertension Paternal Grandfather   . Hyperlipidemia Paternal Grandfather     Social History:  reports that she has never smoked. She has never used smokeless tobacco. She reports that she does not drink alcohol or use illicit drugs.  Allergies: Review of patient's allergies indicates no known allergies.   Current Medications at time of  admission:  Prior to Admission medications   Medication Sig Start Date End Date Taking? Authorizing Provider  acetaminophen (TYLENOL) 500 MG tablet Take 1,000 mg by mouth every 6 (six) hours as needed for moderate pain.   Yes Historical Provider, MD  budesonide (RHINOCORT ALLERGY) 32 MCG/ACT nasal spray Place 2 sprays into both nostrils daily.   Yes Historical Provider, MD  cetirizine (ZYRTEC) 10 MG tablet Take 10 mg by mouth daily.   Yes Historical Provider, MD  docusate sodium (COLACE) 100 MG capsule Take 100-200 mg by mouth daily as needed for mild constipation.   Yes Historical Provider, MD  doxylamine, Sleep, (UNISOM) 25 MG tablet Take 25 mg by mouth at bedtime as needed for sleep.   Yes Historical Provider, MD  lansoprazole (PREVACID) 15 MG capsule Take 15 mg by mouth daily at 12 noon.   Yes Historical Provider, MD  levalbuterol [facility](XOPENEX HFA) 45 MCG/ACT inhaler Inhale 2 puffs into the lungs every 6 (six) hours as needed for wheezing.   Yes Historical Provider, MD  mometasone-formoterol (DULERA) 100-5 MCG/ACT AERO Inhale 2 puffs into the lungs 2 (two) times daily.   Yes Historical Provider, MD  montelukast (SINGULAIR) 10 MG tablet Take 10 mg by mouth at bedtime.   Yes Historical Provider, MD  NON FORMULARY Allergy shots once every 2 weeks   Yes Historical Provider, MD  Prenatal Vit-Fe Fumarate-FA (PRENATAL MULTIVITAMIN) TABS tablet Take 3 tablets by mouth daily at 12 noon.   Yes Historical Provider, MD   Procedures: Cesarean section delivery on 01/17/2016 with delivery of  viable  newborn by Dr Seymour Bars   See operative report for further details APGAR (1 MIN): 8   APGAR (5 MINS): 9    Postoperative / postpartum course:  Uncomplicated with discharge on POD 3  Discharge Instructions:  Discharged Condition: stable  Activity: pelvic rest and postoperative restrictions x 2   Diet: routine  Medications:    Medication List    TAKE these medications        acetaminophen 500 MG tablet   Commonly known as:  TYLENOL  Take 1,000 mg by mouth every 6 (six) hours as needed for moderate pain.     cefUROXime 500 MG tablet  Commonly known as:  CEFTIN  Take 1 tablet (500 mg total) by mouth 2 (two) times daily with a meal.     cetirizine 10 MG tablet  Commonly known as:  ZYRTEC  Take 10 mg by mouth daily.     docusate sodium 100 MG capsule  Commonly known as:  COLACE  Take 100-200 mg by mouth daily as needed for mild constipation.     doxylamine (Sleep) 25 MG tablet  Commonly known as:  UNISOM  Take 25 mg by mouth at bedtime as needed for sleep.     hydrOXYzine 25 MG tablet  Commonly known as:  ATARAX/VISTARIL  Take 1-3 tablets by mouth daily as needed for itching.     ibuprofen 800 MG tablet  Commonly known as:  ADVIL,MOTRIN  Take 1 tablet (800 mg total) by mouth every 8 (eight) hours.     lansoprazole 15 MG capsule  Commonly known as:  PREVACID  Take 15 mg by mouth daily at 12 noon.     levalbuterol 45 MCG/ACT inhaler  Commonly known as:  XOPENEX HFA  Inhale 2 puffs into the lungs every 6 (six) hours as needed for wheezing.     magnesium oxide 400 (241.3 Mg) MG tablet  Commonly known as:  MAG-OX  Take 1 tablet (400 mg total) by mouth daily.     mometasone-formoterol 100-5 MCG/ACT Aero  Commonly known as:  DULERA  Inhale 2 puffs into the lungs 2 (two) times daily.     montelukast 10 MG tablet  Commonly known as:  SINGULAIR  Take 10 mg by mouth at bedtime.     NON FORMULARY  Allergy shots once every 2 weeks     oxyCODONE 5 MG immediate release tablet  Commonly known as:  Oxy IR/ROXICODONE  Take 1 tablet (5 mg total) by mouth every 3 (three) hours as needed (pain scale 4-7).     prenatal multivitamin Tabs tablet  Take 3 tablets by mouth daily at 12 noon.     RHINOCORT ALLERGY 32 MCG/ACT nasal spray  Generic drug:  budesonide  Place 2 sprays into both nostrils daily.        Wound Care: keep clean and dry / remove honeycomb POD 5 Postpartum  Instructions: [facility] discharge booklet - instructions reviewed  Discharge to: Home  Follow up :  [facility] in 6 weeks for routine postpartum visit with Raelyn Mora CNM                Signed: Marlinda Mike CNM, MSN, [facility] 01/21/2016, 8:47 AM

## 2016-01-21 NOTE — Lactation Note (Signed)
This note was copied from a baby's chart. Lactation Consultation Note  10.5% weight loss. Mother hand expressed good flow of colostrum and then latched baby in football hold on R side. Assisted with achieving a deeper latch and compressing breast.  Mother's nipples are tender. Occasionally hearing clicking w/ feeding.  Increased depth and clicking stopped.  Did note w/ oral assessment posterior frenulum tightness. Told Peds MD about clicking when feeding. Encouraged applying ebm after feedings and requested [name] RN to bring mother coconut oil. Baby sustained latch for 15 min.  Demonstrated how to massage breast to keep baby active. Mother had recently pumped 7 ml of colostrum.  She hand expressed an additional 1 ml. Demonstrated how to finger syringe feed with teachback.  Baby took 8 ml. Recommend mother prepump on L side before latching to help evert nipple more.  Mother latched baby on L side with minimal positioning assistance. Encouraged post pumping 4-6x day for 10-15 with DEBP and giving pumped breastmilk to baby after next feeding. Suggested cup feeding or slow flow bottle feeding when volume increases.  Parents stated they would supplement w/ slow flow wide based nipple. Stools are green and seedy.  Baby is sleepy during feeding. OP appointment 6/21. Reviewed engorgement care and monitoring voids/stools. Mom encouraged to feed baby 8-12 times/24 hours and with feeding cues.  Continue to supplement with breastmilk until OP appointment.    Patient Name: Adrienne Williams YNWGN'FToday's Date: 01/21/2016 Reason for consult: Infant weight loss   Maternal Data    Feeding Feeding Type: Breast Fed Length of feed: 15 min  LATCH Score/Interventions Latch: Repeated attempts needed to sustain latch, nipple held in mouth throughout feeding, stimulation needed to elicit sucking reflex.  Audible Swallowing: A few with stimulation  Type of Nipple: Everted at rest and after  stimulation  Comfort (Breast/Nipple): Filling, red/small blisters or bruises, mild/mod discomfort  Problem noted: Mild/Moderate discomfort Interventions (Mild/moderate discomfort): Hand expression  Hold (Positioning): No assistance needed to correctly position infant at breast.  LATCH Score: 7  Lactation Tools Discussed/Used     Consult Status Consult Status: Follow-up Date: 01/25/16 Follow-up type: Out-patient    Dahlia ByesBerkelhammer, [name] 01/21/2016, 10:43 AM

## 2016-01-25 ENCOUNTER — Ambulatory Visit (HOSPITAL_COMMUNITY)
Admit: 2016-01-25 | Discharge: 2016-01-25 | Disposition: A | Discharge status: Home / Self Care | Payer: BLUE CROSS/BLUE SHIELD | Attending: Certified Nurse Midwife | Admitting: Certified Nurse Midwife

## 2016-01-25 NOTE — Lactation Note (Signed)
Lactation Consult  Mother's reason for visit: per mom , latch help , help w/ sleepy baby . Baby making occasional clicking noise w/ latch. Visit Type: feeding assessment  Appointment Notes:  Per LC making the LC O/P appt. Mother working on the latch depth, clicking heard on and off when breast feeding  Posterior frenulum tightness. Pedis aware, wt loss 10.5 %, supplementing with moms milk  Consult:  Initial Lactation Consultant:  Adrienne Williams, Adrienne Williams  ________________________________________________________________________ Baby's Name: Adrienne Williams Date of Birth: 01/18/2016 Pediatrician:Dr. Dahlia ByesElizabeth Williams  Gender: female Gestational Age: 3089w0d (At Birth) Birth Weight: 7 lb 10.9 oz (3485 g) Weight at Discharge: Weight: 6 lb 14.1 oz (3120 g)Date of Discharge: [facility] Nussa[facility]iled Weights   01/18/16 2305 01/19/16 2322 01/21/16 0140  Weight: 7 lb 9.5 oz (3445 g) 7 lb 2.8 oz (3255 g) 6 lb 14.1 oz (3120 g)   Last weight taken from location outside of [facility]: 6/18 - 6-14.5 oz . wt. location outside of [facility]:[id number]} Weight today:3270 g , 7.3.4 oz   ____________________________________________________________________  Mother's Name: Adrienne Williams Type of delivery:  C/section  Breastfeeding Experience:1st baby  Maternal Medical Conditions:  Asthma, anxiety, depression, no risk for milk supply  Maternal Medications:  Per mom - motrin, Tylenol , oxycodone, antibiotics ( almost finished for a cyst near the incision)  Ceftin , Singulair, Rhinocort, inhaler , stool softener.   ________________________________________________________________________  Breastfeeding History (Post Discharge)  Frequency of breastfeeding: try every 3 hours  Duration of feeding:  15 -20 mins , and baby  Gets sleepy   Supplementing: per mom @ 1st when D/c due to weight loss , stopped when weight increased and milk came in   Pumping: per mom Spectra - DEBP , per mom  works great , and also a hand pump  Every 3 hours if needed - 3=4 oz   Infant Intake and Output Assessment  Voids: 5-6 24 hrs.  Color:  Clear yellow Stools: 6 n 24 hrs.  Color:  Green and Yellow  ________________________________________________________________________  Maternal Breast Assessment  Breast:  Full Nipple:  Erect Pain level:  0 Pain interventions:  Expressed breast milk  _______________________________________________________________________ Feeding Assessment/Evaluation  Initial feeding assessment:  Infant's oral assessment:  Variance  Positioning:  Football Right breast  LATCH documentation:  Latch:  2 = Grasps breast easily, tongue down, lips flanged, rhythmical sucking.  Audible swallowing:  2 = Spontaneous and intermittent  Type of nipple:  2 = Everted at rest and after stimulation  Comfort (Breast/Nipple):  1 = Filling, red/small blisters or bruises, mild/mod discomfort  Hold (Positioning):  1 = Assistance needed to correctly position infant at breast and maintain latch  LATCH score: 8   Attached assessment:  Shallow  Lips flanged:  Yes.    Lips untucked:  No.  Suck assessment:  Nutritive  Tools:  None  Instructed on use and cleaning of tool:  No.  Pre-feed weight: 3270 g , 7-3.4 oz  Post-feed weight:  3330 g , 7-5.4 oz  Amount transferred: 60 ml  Amount supplemented: none needed   Additional Feeding Assessment -   Infant's oral assessment:  Variance  Positioning:  Cross cradle Left breast  LATCH documentation:  Latch:  1 = Repeated attempts needed to sustain latch, nipple held in mouth throughout feeding, stimulation needed to elicit sucking reflex.  Audible swallowing:  2 = Spontaneous and intermittent  Type of nipple:  2 = Everted at rest and after stimulation  Comfort (Breast/Nipple):  1 = Filling, red/small blisters or bruises, mild/mod discomfort  Hold (Positioning):  1 = Assistance needed to correctly position infant at breast and  maintain latch  LATCH score:  7   Attached assessment:  Shallow @ 1st , re- latched and improved to 9   Lips flanged:  Yes.    Lips untucked:  No., eased chin down   Suck assessment:  Nutritive  Tools:  None  Instructed on use and cleaning of tool:  No.   Wet diaper changed by dad . And re-weight   Pre-feed weight:  3312 g , 7-4.8 oz  Post-feed weight:  3346 g , 7-6.0 oz  Amount transferred:  34 ml  Amount supplemented: none needed    Total amount pumped post feed: none needed , baby softened both breast, mom comfortable   Total amount transferred: 94 ml - small amount of spitting  Total supplement given:  None   Lactation Impression: Mom is very motivated to make breast feeding work for baby Adrienne Williams  And has been working hard with dad's support.  Baby weight today was a gain - 7-3.4 oz  Baby transferred 94 ml off the breast ( fed on both breast )  LC assisted mom to obtain the depth , no clicking noted. Baby fed 10 -15 mins both breast , and sustained latch well  Once the depth at the breast was obtained. Upper lip stretched well at the [facility], and the posterior frenulum didn't seem to poss a problem  With transferring milk at this time.  Dimpling in cheek noted. Once the depth achieved no dimpling in cheek noted.  Per mom dad gives occasional bottle of EBM and baby takes greater than 2 oz - Dr. Manson Williams slow flow premie  LC recommended due to baby  already being introduced to a bottle - make sure if giving one mom pumps and  When baby receives a bottle - tickle upper lip and wait for wide open mouth.  Lactation Plan of Care:  Praised mom for her efforts and dad being so supportive  Feedings- with cues - every 2 1/2 - 3 hours  Growth spurts 7-10 days , 3 weeks , 6 weeks, etc, clustering is normal  Reminders:  If breast are Full - hand express or use hand pump - express 20 ml off 1st breast  Latch - work on getting Adrienne Williams to open wide  Breast compressions w/ latch until  swallows / then intermittent  Full Breast = Good Sign  If breast greater than full heading towards tight - firm or hard =Engorgement Ice  15 -20 mins  And then express off - latch baby.

## 2017-11-14 LAB — OB RESULTS CONSOLE ABO/RH: RH Type: NEGATIVE

## 2017-11-14 LAB — OB RESULTS CONSOLE HEPATITIS B SURFACE ANTIGEN: Hepatitis B Surface Ag: NEGATIVE

## 2017-11-14 LAB — OB RESULTS CONSOLE RUBELLA ANTIBODY, IGM: Rubella: IMMUNE

## 2017-11-14 LAB — OB RESULTS CONSOLE ANTIBODY SCREEN: ANTIBODY SCREEN: NEGATIVE

## 2017-11-14 LAB — OB RESULTS CONSOLE RPR: RPR: NONREACTIVE

## 2017-11-14 LAB — OB RESULTS CONSOLE GC/CHLAMYDIA
CHLAMYDIA, DNA PROBE: NEGATIVE
GC PROBE AMP, GENITAL: NEGATIVE

## 2017-11-14 LAB — OB RESULTS CONSOLE HIV ANTIBODY (ROUTINE TESTING): HIV: NONREACTIVE

## 2018-02-27 ENCOUNTER — Other Ambulatory Visit: Payer: Self-pay | Admitting: Obstetrics and Gynecology

## 2018-02-27 DIAGNOSIS — R1013 Epigastric pain: Secondary | ICD-10-CM

## 2018-02-28 ENCOUNTER — Ambulatory Visit (HOSPITAL_COMMUNITY)
Admission: RE | Admit: 2018-02-28 | Discharge: 2018-02-28 | Disposition: A | Discharge status: Home / Self Care | Payer: Managed Care, Other (non HMO) | Source: Ambulatory Visit | Attending: Obstetrics and Gynecology | Admitting: Obstetrics and Gynecology

## 2018-02-28 DIAGNOSIS — R1013 Epigastric pain: Secondary | ICD-10-CM | POA: Diagnosis present

## 2018-02-28 IMAGING — US US ABDOMEN LIMITED
1 series · 14 of 25 positions shown · non-contrast
Comparison: CT abdomen and pelvis [DATE]

CLINICAL DATA: Epigastric region pain

EXAM:
ULTRASOUND ABDOMEN LIMITED RIGHT UPPER QUADRANT

[Series 1: us abdomen limited · 14 of 66 slices shown]
[im 1/66]
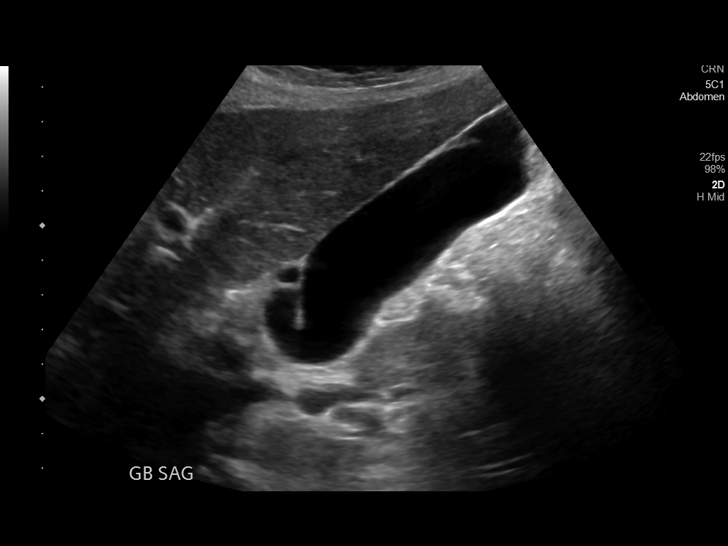
[im 6/66]
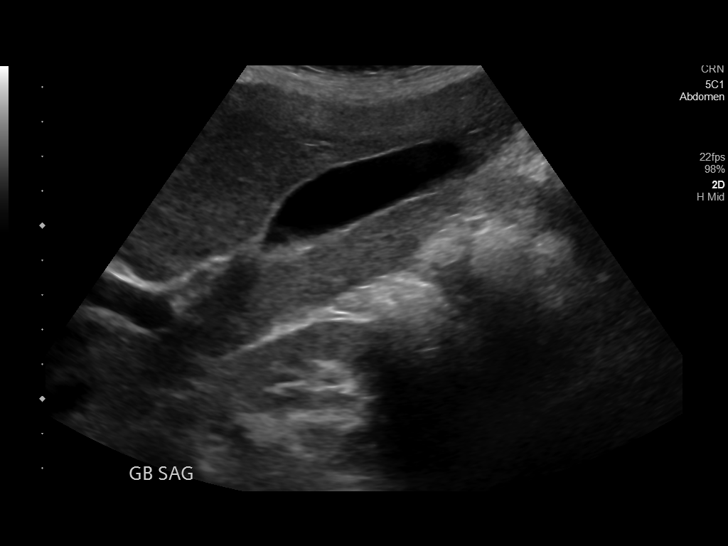
[im 11/66]
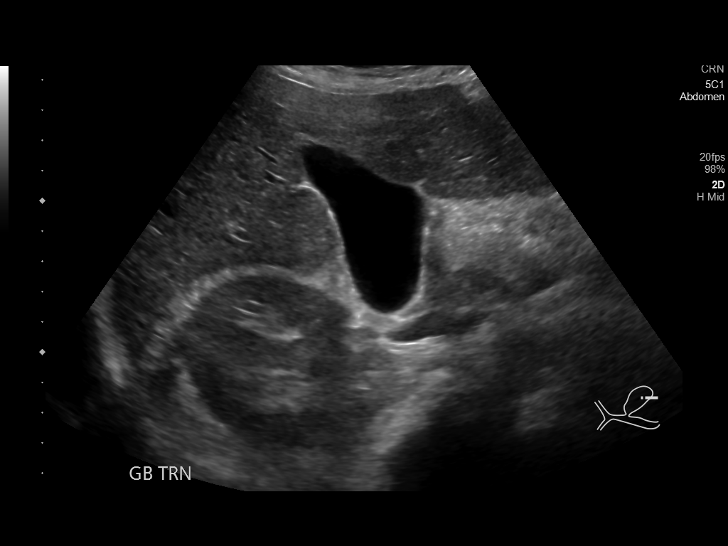
[im 17/66]
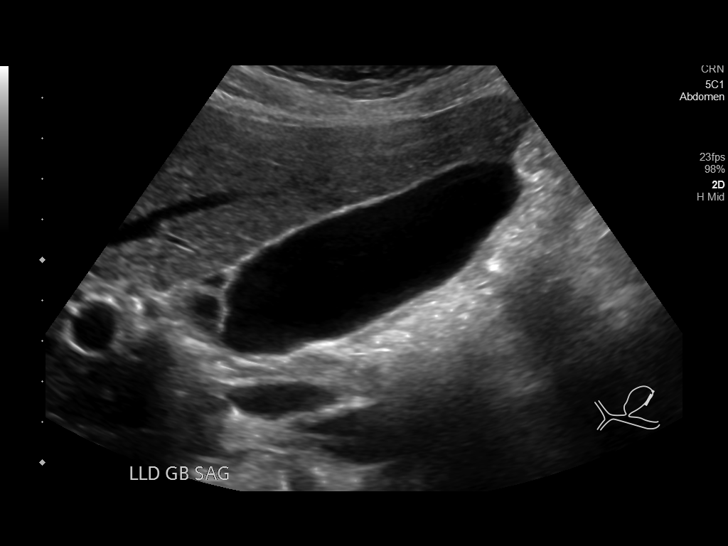
[im 22/66]
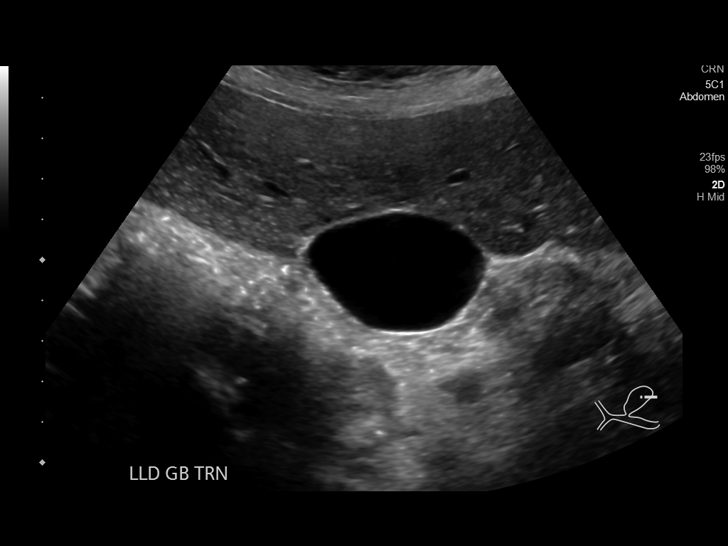
[im 25/66]
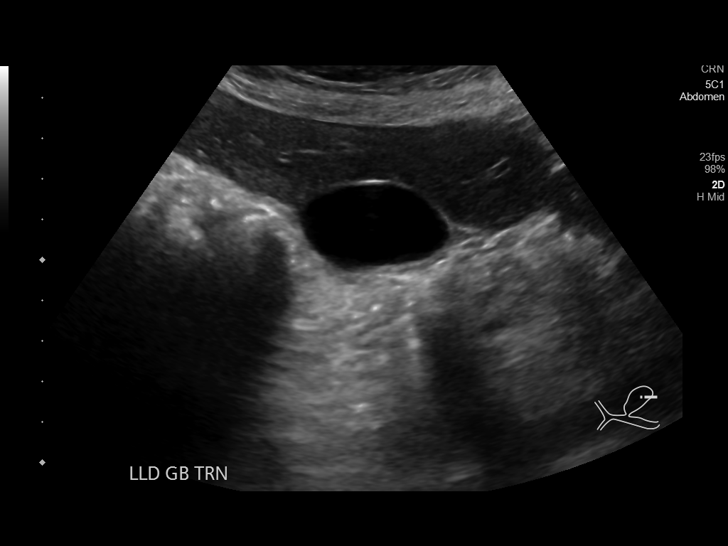
[im 30/66]
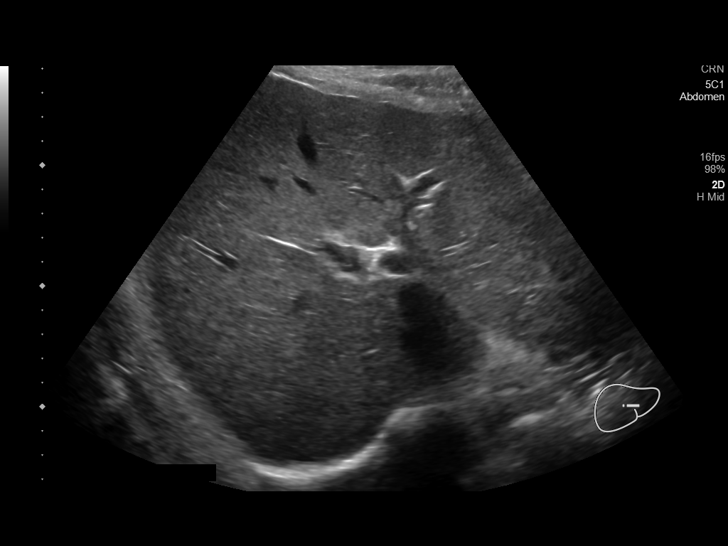
[im 36/66]
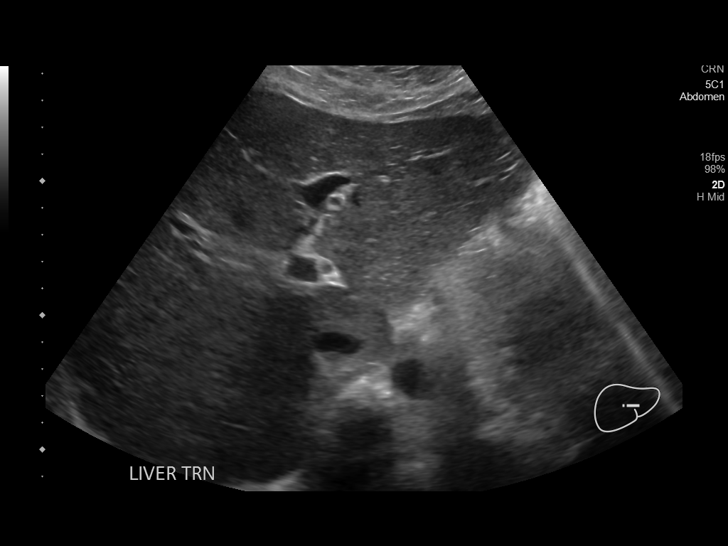
[im 41/66]
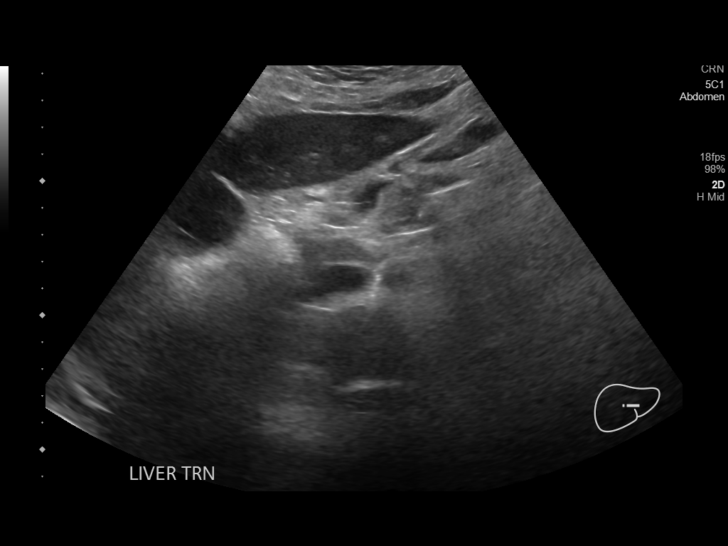
[im 44/66]
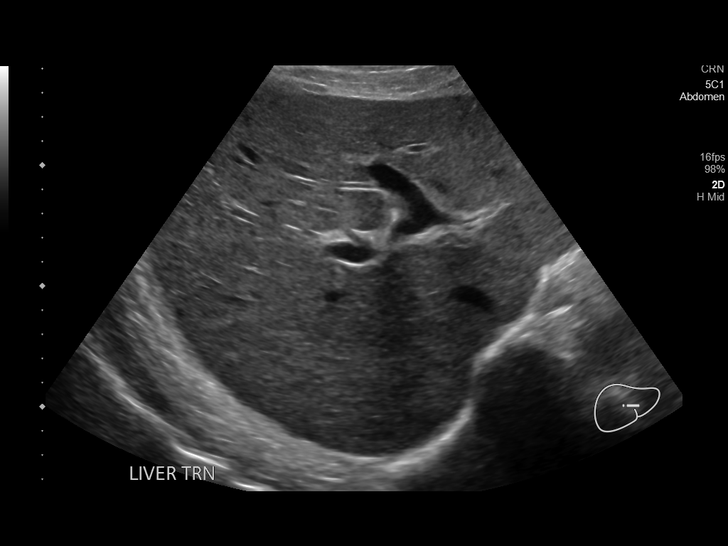
[im 49/66]
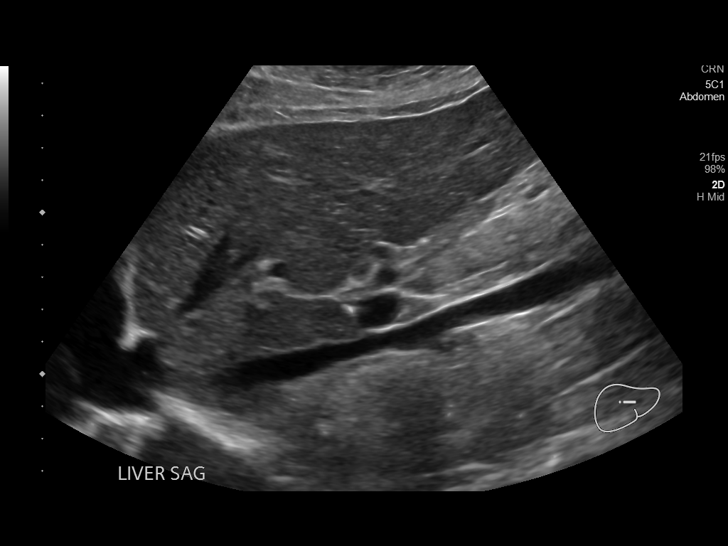
[im 55/66]
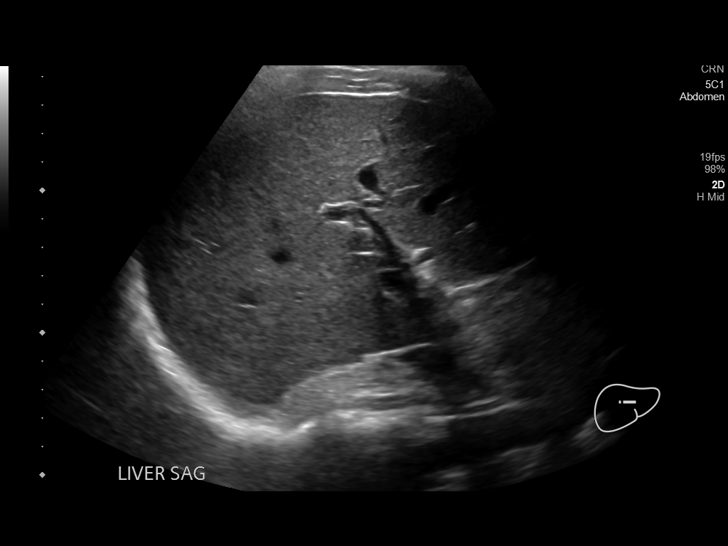
[im 60/66]
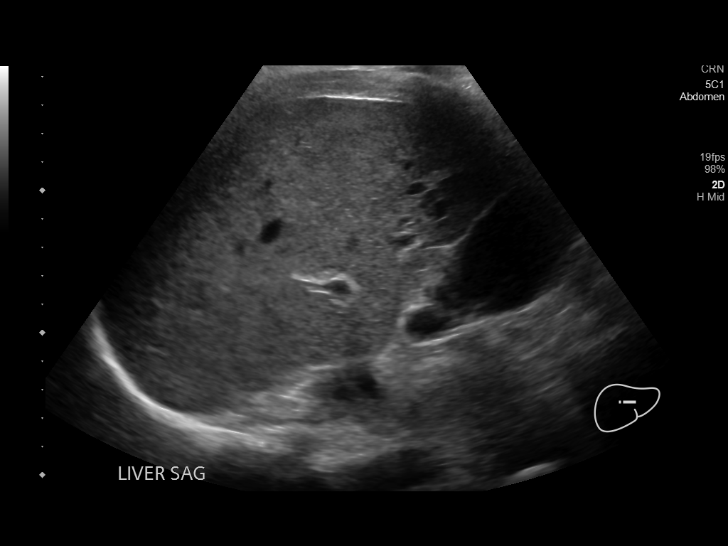
[im 66/66]
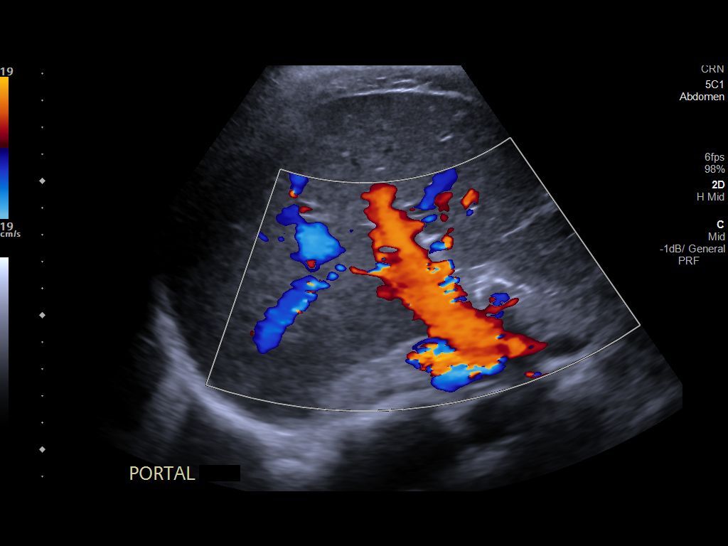

[14 of 25 positions shown; findings below may reference images not displayed]

FINDINGS: Gallbladder:

No gallstones or wall thickening visualized. There is no
pericholecystic fluid. No sonographic Murphy sign noted by
sonographer.

Common bile duct:

Diameter: 2 mm. No intrahepatic or extrahepatic biliary duct
dilatation.

Liver:

No focal lesion identified. Within normal limits in parenchymal
echogenicity. Portal vein is patent on color Doppler imaging with
normal direction of blood flow towards the liver.
IMPRESSION: Study within normal limits.

## 2018-05-09 ENCOUNTER — Encounter (HOSPITAL_COMMUNITY): Payer: Self-pay | Admitting: *Deleted

## 2018-05-09 ENCOUNTER — Inpatient Hospital Stay (HOSPITAL_COMMUNITY)
Admission: AD | Admit: 2018-05-09 | Discharge: 2018-05-09 | Disposition: A | Discharge status: Home / Self Care | Payer: Managed Care, Other (non HMO) | Source: Ambulatory Visit | Attending: Obstetrics | Admitting: Obstetrics

## 2018-05-09 DIAGNOSIS — F329 Major depressive disorder, single episode, unspecified: Secondary | ICD-10-CM | POA: Diagnosis not present

## 2018-05-09 DIAGNOSIS — O99343 Other mental disorders complicating pregnancy, third trimester: Secondary | ICD-10-CM | POA: Diagnosis not present

## 2018-05-09 DIAGNOSIS — J45909 Unspecified asthma, uncomplicated: Secondary | ICD-10-CM | POA: Insufficient documentation

## 2018-05-09 DIAGNOSIS — O26893 Other specified pregnancy related conditions, third trimester: Secondary | ICD-10-CM | POA: Insufficient documentation

## 2018-05-09 DIAGNOSIS — Z8249 Family history of ischemic heart disease and other diseases of the circulatory system: Secondary | ICD-10-CM | POA: Insufficient documentation

## 2018-05-09 DIAGNOSIS — O99513 Diseases of the respiratory system complicating pregnancy, third trimester: Secondary | ICD-10-CM | POA: Diagnosis not present

## 2018-05-09 DIAGNOSIS — R030 Elevated blood-pressure reading, without diagnosis of hypertension: Secondary | ICD-10-CM | POA: Insufficient documentation

## 2018-05-09 DIAGNOSIS — Z3A35 35 weeks gestation of pregnancy: Secondary | ICD-10-CM | POA: Diagnosis not present

## 2018-05-09 DIAGNOSIS — Z791 Long term (current) use of non-steroidal anti-inflammatories (NSAID): Secondary | ICD-10-CM | POA: Insufficient documentation

## 2018-05-09 DIAGNOSIS — Z79899 Other long term (current) drug therapy: Secondary | ICD-10-CM | POA: Insufficient documentation

## 2018-05-09 LAB — CBC
HCT: 34.5 % — ABNORMAL LOW (ref 36.0–46.0)
HEMOGLOBIN: 12 g/dL (ref 12.0–15.0)
MCH: 32.5 pg (ref 26.0–34.0)
MCHC: 34.8 g/dL (ref 30.0–36.0)
MCV: 93.5 fL (ref 78.0–100.0)
PLATELETS: 293 10*3/uL (ref 150–400)
RBC: 3.69 MIL/uL — AB (ref 3.87–5.11)
RDW: 13.5 % (ref 11.5–15.5)
WBC: 13.2 10*3/uL — AB (ref 4.0–10.5)

## 2018-05-09 LAB — COMPREHENSIVE METABOLIC PANEL
ALK PHOS: 102 U/L (ref 38–126)
ALT: 22 U/L (ref 0–44)
ANION GAP: 8 (ref 5–15)
AST: 19 U/L (ref 15–41)
Albumin: 2.8 g/dL — ABNORMAL LOW (ref 3.5–5.0)
BUN: 13 mg/dL (ref 6–20)
CALCIUM: 8.7 mg/dL — AB (ref 8.9–10.3)
CO2: 21 mmol/L — ABNORMAL LOW (ref 22–32)
Chloride: 105 mmol/L (ref 98–111)
Creatinine, Ser: 0.37 mg/dL — ABNORMAL LOW (ref 0.44–1.00)
GFR calc Af Amer: >60 mL/min (ref >60)
GFR calc non Af Amer: >60 mL/min (ref >60)
Glucose, Bld: 104 mg/dL — ABNORMAL HIGH (ref 70–99)
Potassium: 4.2 mmol/L (ref 3.5–5.1)
Sodium: 134 mmol/L — ABNORMAL LOW (ref 135–145)
Total Bilirubin: 0.4 mg/dL (ref 0.3–1.2)
Total Protein: 6.7 g/dL (ref 6.5–8.1)

## 2018-05-09 LAB — PROTEIN / CREATININE RATIO, URINE
Creatinine, Urine: 21 mg/dL
Total Protein, Urine: <6 mg/dL

## 2018-05-09 LAB — URINALYSIS, ROUTINE W REFLEX MICROSCOPIC
BILIRUBIN URINE: NEGATIVE
Glucose, UA: NEGATIVE mg/dL
Hgb urine dipstick: NEGATIVE
KETONES UR: NEGATIVE mg/dL
LEUKOCYTES UA: NEGATIVE
NITRITE: NEGATIVE
Protein, ur: NEGATIVE mg/dL
Specific Gravity, Urine: 1.006 (ref 1.005–1.030)
pH: 6 (ref 5.0–8.0)

## 2018-05-09 NOTE — MAU Note (Signed)
Pt reports she usually has low B/P 115/75. Today she stared having a headache and felt her feet were swelling. Took her B/P 130/80. Waited a few hours felt a little better and headache was gone relaxed and b/p was up more 141/87.

## 2018-05-09 NOTE — Discharge Instructions (Signed)
How to Take Your Blood Pressure You can take your blood pressure at home with a machine. You may need to check your blood pressure at home:  To check if you have high blood pressure (hypertension).  To check your blood pressure over time.  To make sure your blood pressure medicine is working.  Supplies needed: You will need a blood pressure machine, or monitor. You can buy one at a drugstore or online. When choosing one:  Choose one with an arm cuff.  Choose one that wraps around your upper arm. Only one finger should fit between your arm and the cuff.  Do not choose one that measures your blood pressure from your wrist or finger.  Your doctor can suggest a monitor. How to prepare Avoid these things for 30 minutes before checking your blood pressure:  Drinking caffeine.  Drinking alcohol.  Eating.  Smoking.  Exercising.  Five minutes before checking your blood pressure:  Pee.  Sit in a dining chair. Avoid sitting in a soft couch or armchair.  Be quiet. Do not talk.  How to take your blood pressure Follow the instructions that came with your machine. If you have a digital blood pressure monitor, these may be the instructions: 1. Sit up straight. 2. Place your feet on the floor. Do not cross your ankles or legs. 3. Rest your left arm at the level of your heart. You may rest it on a table, desk, or chair. 4. Pull up your shirt sleeve. 5. Wrap the blood pressure cuff around the upper part of your left arm. The cuff should be 1 inch (2.5 cm) above your elbow. It is best to wrap the cuff around bare skin. 6. Fit the cuff snugly around your arm. You should be able to place only one finger between the cuff and your arm. 7. Put the cord inside the groove of your elbow. 8. Press the power button. 9. Sit quietly while the cuff fills with air and loses air. 10. Write down the numbers on the screen. 11. Wait 2-3 minutes and then repeat steps 1-10.  What do the numbers  mean? Two numbers make up your blood pressure. The first number is called systolic pressure. The second is called diastolic pressure. An example of a blood pressure reading is "120 over 80" (or 120/80). If you are an adult and do not have a medical condition, use this guide to find out if your blood pressure is normal: Normal  First number: below 120.  Second number: below 80. Elevated  First number: 120-129.  Second number: below 80. Hypertension stage 1  First number: 130-139.  Second number: 80-89. Hypertension stage 2  First number: 140 or above.  Second number: 90 or above. Your blood pressure is above normal even if only the top or bottom number is above normal. Follow these instructions at home:  Check your blood pressure as often as your doctor tells you to.  Take your monitor to your next doctor's appointment. Your doctor will: ? Make sure you are using it correctly. ? Make sure it is working right.  Make sure you understand what your blood pressure numbers should be.  Tell your doctor if your medicines are causing side effects. Contact a doctor if:  Your blood pressure keeps being high. Get help right away if:  Your first blood pressure number is higher than 180.  Your second blood pressure number is higher than 120. This information is not intended to replace advice given   to you by your health care provider. Make sure you discuss any questions you have with your health care provider. Document Released: 07/05/2008 Document Revised: 06/20/2016 Document Reviewed: 12/30/2015 Elsevier Interactive Patient Education  2018 Elsevier Inc.  

## 2018-05-09 NOTE — MAU Provider Note (Addendum)
Patient Adrienne Williams is a 37 y.o. G2P1001 At [redacted]w[redacted]d here with complaints because her blood pressure was elevated when she was at work today. She denies decreased fetal movements, LOF,  RUQ pain, severe headache, vaginal bleeding or other ob-gyn complaints.  History     CSN: [id number]  Arrival date and time: 05/09/18 2057   None     No chief complaint on file.  Hypertension  This is a new problem. The current episode started today. Pertinent negatives include no blurred vision or shortness of breath.  Patient checked her blood pressure today at work at 245 pm and it was 132/83. Her normal blood pressure is usually 110/70s. She then checked it at 724 pm at work again and it was 141/87. At that point, she decided to call her provider at [facility], who told her that she should come in to be seen. She denies any other signs of pre-eclampsia. She says that she had elevated pressures in her last pregnancy (126/82, which was higher than normal for her), but did not meet the criteria for gestational hypertension or pre-e. She has not had any high blood pressures between pregnancies.   OB History    Gravida  2   Para  1   Term  1   Preterm      AB      Living  1     SAB      TAB      Ectopic      Multiple  0   Live Births  1           Past Medical History:  Diagnosis Date  . Asthma   . Depression   . Hx of varicella   . PONV (postoperative nausea and vomiting)    "little nauseated"  . Postpartum care following cesarean delivery (6/14) 01/18/2016    Past Surgical History:  Procedure Laterality Date  . CESAREAN SECTION N/A 01/18/2016   Procedure: Primary CESAREAN SECTION;  Surgeon: Genia Del, MD;  [facility]: [facility];  Service: Obstetrics;  Laterality: N/A;  EDD: 01/25/16  . CYSTECTOMY  2002  . WISDOM TOOTH EXTRACTION      Family History  Problem Relation Age of Onset  . Cancer Maternal Grandmother        stomach  . Hypothyroidism Paternal  Grandmother   . Atrial fibrillation Paternal Grandmother   . Angina Paternal Grandfather   . Hypertension Paternal Grandfather   . Hyperlipidemia Paternal Grandfather     Social History   Tobacco Use  . Smoking status: Never Smoker  . Smokeless tobacco: Never Used  Substance Use Topics  . Alcohol use: No  . Drug use: No    Allergies: No Known Allergies  Medications Prior to Admission  Medication Sig Dispense Refill Last Dose  . acetaminophen (TYLENOL) 500 MG tablet Take 1,000 mg by mouth every 6 (six) hours as needed for moderate pain.   01/18/2016 at 0800  . budesonide (RHINOCORT ALLERGY) 32 MCG/ACT nasal spray Place 2 sprays into both nostrils daily.   01/17/2016 at Unknown time  . cefUROXime (CEFTIN) 500 MG tablet Take 1 tablet (500 mg total) by mouth 2 (two) times daily with a meal. 14 tablet 0   . cetirizine (ZYRTEC) 10 MG tablet Take 10 mg by mouth daily.   01/17/2016 at Unknown time  . docusate sodium (COLACE) 100 MG capsule Take 100-200 mg by mouth daily as needed for mild constipation.   01/17/2016 at Unknown time  .  doxylamine, Sleep, (UNISOM) 25 MG tablet Take 25 mg by mouth at bedtime as needed for sleep.   01/17/2016 at Unknown time  . hydrOXYzine (ATARAX/VISTARIL) 25 MG tablet Take 1-3 tablets by mouth daily as needed for itching.   3 Unknown at Unknown time  . ibuprofen (ADVIL,MOTRIN) 800 MG tablet Take 1 tablet (800 mg total) by mouth every 8 (eight) hours. 30 tablet 0   . lansoprazole (PREVACID) 15 MG capsule Take 15 mg by mouth daily at 12 noon.   01/18/2016 at 0900  . levalbuterol (XOPENEX HFA) 45 MCG/ACT inhaler Inhale 2 puffs into the lungs every 6 (six) hours as needed for wheezing.   Past Month at Unknown time  . magnesium oxide (MAG-OX) 400 (241.3 Mg) MG tablet Take 1 tablet (400 mg total) by mouth daily. 30 tablet 4   . mometasone-formoterol (DULERA) 100-5 MCG/ACT AERO Inhale 2 puffs into the lungs 2 (two) times daily.   01/18/2016 at 1000  . montelukast (SINGULAIR)  10 MG tablet Take 10 mg by mouth at bedtime.   01/17/2016 at Unknown time  . NON FORMULARY Allergy shots once every 2 weeks   Past Week at Unknown time  . oxyCODONE (OXY IR/ROXICODONE) 5 MG immediate release tablet Take 1 tablet (5 mg total) by mouth every 3 (three) hours as needed (pain scale 4-7). 30 tablet 0   . Prenatal Vit-Fe Fumarate-FA (PRENATAL MULTIVITAMIN) TABS tablet Take 3 tablets by mouth daily at 12 noon.   01/17/2016 at Unknown time    Review of Systems  Constitutional: Negative.   HENT: Negative.   Eyes: Negative.  Negative for blurred vision.  Respiratory: Negative for shortness of breath.   Genitourinary: Negative.   Musculoskeletal: Negative.   Neurological: Negative.   Hematological: Negative.   Psychiatric/Behavioral: Negative.    Physical Exam   Blood pressure 119/74, pulse 90, temperature 98.1 F (36.7 C), temperature source Oral, resp. rate 18, height 5\' 3"  (1.6 m), weight 74.8 kg, unknown if currently breastfeeding.  Physical Exam  Constitutional: She is oriented to person, place, and time. She appears well-developed and well-nourished.  HENT:  Head: Normocephalic.  Neck: Normal range of motion.  Respiratory: Effort normal.  GI: Soft.  Musculoskeletal: Normal range of motion.  Neurological: She is alert and oriented to person, place, and time.  Skin: Skin is warm and dry.  Psychiatric: She has a normal mood and affect.    MAU Course  Procedures  MDM -NST: 130 bpm, mod var, present acel, negative decels.  -labs reviewed, no s/s or evidence of pre-e.  -BP have been normal in MAU Assessment and Plan   1. Transient elevated blood pressure    2. Patient stable for discharge; reviewed warning signs and when to return to MAU.   3. Patient verbalized understanding; will keep ob appt on Monday, October 7.   Charlesetta Garibaldi Khyrin Trevathan 05/09/2018, 11:05 PM

## 2018-05-23 ENCOUNTER — Other Ambulatory Visit: Payer: Self-pay | Admitting: Obstetrics and Gynecology

## 2018-05-26 ENCOUNTER — Encounter (HOSPITAL_COMMUNITY): Payer: Self-pay

## 2018-05-29 ENCOUNTER — Inpatient Hospital Stay (HOSPITAL_COMMUNITY)
Admission: AD | Admit: 2018-05-29 | Discharge: 2018-05-31 | DRG: 788 | Disposition: A | Discharge status: Home / Self Care | Payer: Managed Care, Other (non HMO) | Attending: Obstetrics & Gynecology | Admitting: Obstetrics & Gynecology

## 2018-05-29 ENCOUNTER — Encounter (HOSPITAL_COMMUNITY): Payer: Self-pay | Admitting: *Deleted

## 2018-05-29 ENCOUNTER — Inpatient Hospital Stay (HOSPITAL_COMMUNITY): Payer: Managed Care, Other (non HMO) | Admitting: Anesthesiology

## 2018-05-29 ENCOUNTER — Encounter (HOSPITAL_COMMUNITY)
Admission: AD | Disposition: A | Discharge status: Home / Self Care | Payer: Self-pay | Source: Home / Self Care | Attending: Obstetrics & Gynecology

## 2018-05-29 ENCOUNTER — Other Ambulatory Visit: Payer: Self-pay

## 2018-05-29 DIAGNOSIS — F329 Major depressive disorder, single episode, unspecified: Secondary | ICD-10-CM | POA: Diagnosis present

## 2018-05-29 DIAGNOSIS — F419 Anxiety disorder, unspecified: Secondary | ICD-10-CM | POA: Diagnosis present

## 2018-05-29 DIAGNOSIS — O9952 Diseases of the respiratory system complicating childbirth: Secondary | ICD-10-CM | POA: Diagnosis present

## 2018-05-29 DIAGNOSIS — Z3A38 38 weeks gestation of pregnancy: Secondary | ICD-10-CM

## 2018-05-29 DIAGNOSIS — O34219 Maternal care for unspecified type scar from previous cesarean delivery: Secondary | ICD-10-CM | POA: Diagnosis present

## 2018-05-29 DIAGNOSIS — O321XX0 Maternal care for breech presentation, not applicable or unspecified: Principal | ICD-10-CM | POA: Diagnosis present

## 2018-05-29 DIAGNOSIS — O26899 Other specified pregnancy related conditions, unspecified trimester: Secondary | ICD-10-CM

## 2018-05-29 DIAGNOSIS — Z6791 Unspecified blood type, Rh negative: Secondary | ICD-10-CM

## 2018-05-29 DIAGNOSIS — O26893 Other specified pregnancy related conditions, third trimester: Secondary | ICD-10-CM | POA: Diagnosis present

## 2018-05-29 DIAGNOSIS — O34211 Maternal care for low transverse scar from previous cesarean delivery: Secondary | ICD-10-CM | POA: Diagnosis present

## 2018-05-29 DIAGNOSIS — J45909 Unspecified asthma, uncomplicated: Secondary | ICD-10-CM | POA: Diagnosis present

## 2018-05-29 DIAGNOSIS — Z98891 History of uterine scar from previous surgery: Secondary | ICD-10-CM

## 2018-05-29 DIAGNOSIS — O43123 Velamentous insertion of umbilical cord, third trimester: Secondary | ICD-10-CM | POA: Diagnosis present

## 2018-05-29 DIAGNOSIS — O99344 Other mental disorders complicating childbirth: Secondary | ICD-10-CM | POA: Diagnosis present

## 2018-05-29 DIAGNOSIS — O9081 Anemia of the puerperium: Secondary | ICD-10-CM | POA: Diagnosis not present

## 2018-05-29 DIAGNOSIS — F32A Depression, unspecified: Secondary | ICD-10-CM | POA: Diagnosis present

## 2018-05-29 HISTORY — DX: Maternal care for breech presentation, not applicable or unspecified: O32.1XX0

## 2018-05-29 LAB — CBC WITH DIFFERENTIAL/PLATELET
BASOS ABS: 0 10*3/uL (ref 0.0–0.1)
BASOS PCT: 0 %
EOS ABS: 0.1 10*3/uL (ref 0.0–0.5)
EOS PCT: 1 %
HCT: 35 % — ABNORMAL LOW (ref 36.0–46.0)
Hemoglobin: 12.1 g/dL (ref 12.0–15.0)
LYMPHS ABS: 3.4 10*3/uL (ref 0.7–4.0)
Lymphocytes Relative: 26 %
MCH: 32.4 pg (ref 26.0–34.0)
MCHC: 34.6 g/dL (ref 30.0–36.0)
MCV: 93.8 fL (ref 80.0–100.0)
Monocytes Absolute: 0.6 10*3/uL (ref 0.1–1.0)
Monocytes Relative: 4 %
NRBC: 0 % (ref 0.0–0.2)
Neutro Abs: 9.1 10*3/uL — ABNORMAL HIGH (ref 1.7–7.7)
Neutrophils Relative %: 69 %
PLATELETS: 272 10*3/uL (ref 150–400)
RBC: 3.73 MIL/uL — ABNORMAL LOW (ref 3.87–5.11)
RDW: 14 % (ref 11.5–15.5)
WBC: 13.1 10*3/uL — ABNORMAL HIGH (ref 4.0–10.5)

## 2018-05-29 LAB — CBC
HEMATOCRIT: 32.8 % — AB (ref 36.0–46.0)
Hemoglobin: 11.2 g/dL — ABNORMAL LOW (ref 12.0–15.0)
MCH: 32.1 pg (ref 26.0–34.0)
MCHC: 34.1 g/dL (ref 30.0–36.0)
MCV: 94 fL (ref 80.0–100.0)
Platelets: 252 10*3/uL (ref 150–400)
RBC: 3.49 MIL/uL — ABNORMAL LOW (ref 3.87–5.11)
RDW: 13.9 % (ref 11.5–15.5)
WBC: 18.5 10*3/uL — ABNORMAL HIGH (ref 4.0–10.5)
nRBC: 0 % (ref 0.0–0.2)

## 2018-05-29 LAB — POCT FERN TEST: POCT Fern Test: POSITIVE

## 2018-05-29 LAB — RPR: RPR: NONREACTIVE

## 2018-05-29 SURGERY — Surgical Case
Anesthesia: Spinal

## 2018-05-29 MED ORDER — ACETAMINOPHEN 325 MG PO TABS: 650.0000 mg | ORAL_TABLET | ORAL | Status: DC | PRN

## 2018-05-29 MED ORDER — PRENATAL MULTIVITAMIN CH
1.0000 | ORAL_TABLET | Freq: Every day | ORAL | Status: DC
  Administered 2018-05-29 – 2018-05-31 (×3): 1 via ORAL
  Filled 2018-05-29 (×3): qty 1

## 2018-05-29 MED ORDER — OXYCODONE HCL 5 MG PO TABS
10.0000 mg | ORAL_TABLET | ORAL | Status: DC | PRN
  Filled 2018-05-29: qty 2

## 2018-05-29 MED ORDER — TETANUS-DIPHTH-ACELL PERTUSSIS 5-2.5-18.5 LF-MCG/0.5 IM SUSP: 0.5000 mL | Freq: Once | INTRAMUSCULAR | Status: DC

## 2018-05-29 MED ORDER — NALOXONE HCL 4 MG/10ML IJ SOLN
1.0000 ug/kg/h | INTRAVENOUS | Status: DC | PRN
  Filled 2018-05-29: qty 5

## 2018-05-29 MED ORDER — SCOPOLAMINE 1 MG/3DAYS TD PT72
MEDICATED_PATCH | TRANSDERMAL | Status: AC
  Filled 2018-05-29: qty 1

## 2018-05-29 MED ORDER — SCOPOLAMINE 1 MG/3DAYS TD PT72
MEDICATED_PATCH | TRANSDERMAL | Status: DC | PRN
  Administered 2018-05-29: 1 via TRANSDERMAL

## 2018-05-29 MED ORDER — COCONUT OIL OIL: 1.0000 "application " | TOPICAL_OIL | Status: DC | PRN

## 2018-05-29 MED ORDER — KETOROLAC TROMETHAMINE 30 MG/ML IJ SOLN
INTRAMUSCULAR | Status: AC
  Filled 2018-05-29: qty 1

## 2018-05-29 MED ORDER — OXYCODONE HCL 5 MG PO TABS
5.0000 mg | ORAL_TABLET | ORAL | Status: DC | PRN
  Administered 2018-05-29: 5 mg via ORAL
  Filled 2018-05-29: qty 1

## 2018-05-29 MED ORDER — DIBUCAINE 1 % RE OINT: 1.0000 "application " | TOPICAL_OINTMENT | RECTAL | Status: DC | PRN

## 2018-05-29 MED ORDER — SENNOSIDES-DOCUSATE SODIUM 8.6-50 MG PO TABS
2.0000 | ORAL_TABLET | ORAL | Status: DC
  Administered 2018-05-30 (×2): 2 via ORAL
  Filled 2018-05-29 (×2): qty 2

## 2018-05-29 MED ORDER — HYDROMORPHONE HCL 1 MG/ML IJ SOLN
0.2500 mg | INTRAMUSCULAR | Status: DC | PRN
  Administered 2018-05-29: 0.5 mg via INTRAVENOUS

## 2018-05-29 MED ORDER — BUPIVACAINE IN DEXTROSE 0.75-8.25 % IT SOLN
INTRATHECAL | Status: DC | PRN
  Administered 2018-05-29: 1.5 mL via INTRATHECAL

## 2018-05-29 MED ORDER — NALOXONE HCL 0.4 MG/ML IJ SOLN: 0.4000 mg | INTRAMUSCULAR | Status: DC | PRN

## 2018-05-29 MED ORDER — OXYCODONE HCL 5 MG PO TABS
ORAL_TABLET | ORAL | Status: AC
  Filled 2018-05-29: qty 1

## 2018-05-29 MED ORDER — MORPHINE SULFATE (PF) 0.5 MG/ML IJ SOLN
INTRAMUSCULAR | Status: DC | PRN
  Administered 2018-05-29: .15 mg via INTRATHECAL

## 2018-05-29 MED ORDER — LORATADINE 10 MG PO TABS
10.0000 mg | ORAL_TABLET | Freq: Every day | ORAL | Status: DC
  Administered 2018-05-29 – 2018-05-30 (×2): 10 mg via ORAL
  Filled 2018-05-29 (×2): qty 1

## 2018-05-29 MED ORDER — MEPERIDINE HCL 25 MG/ML IJ SOLN: 6.2500 mg | INTRAMUSCULAR | Status: DC | PRN

## 2018-05-29 MED ORDER — PHENYLEPHRINE 8 MG IN D5W 100 ML (0.08MG/ML) PREMIX OPTIME
INJECTION | INTRAVENOUS | Status: DC | PRN
  Administered 2018-05-29: 60 ug/min via INTRAVENOUS

## 2018-05-29 MED ORDER — BUPROPION HCL ER (XL) 300 MG PO TB24
300.0000 mg | ORAL_TABLET | Freq: Every day | ORAL | Status: DC
  Administered 2018-05-29 – 2018-05-31 (×3): 300 mg via ORAL
  Filled 2018-05-29 (×4): qty 1

## 2018-05-29 MED ORDER — IBUPROFEN 600 MG PO TABS
600.0000 mg | ORAL_TABLET | Freq: Four times a day (QID) | ORAL | Status: DC
  Administered 2018-05-29: 600 mg via ORAL
  Filled 2018-05-29: qty 1

## 2018-05-29 MED ORDER — SIMETHICONE 80 MG PO CHEW
80.0000 mg | CHEWABLE_TABLET | Freq: Three times a day (TID) | ORAL | Status: DC
  Administered 2018-05-29 – 2018-05-31 (×6): 80 mg via ORAL
  Filled 2018-05-29 (×6): qty 1

## 2018-05-29 MED ORDER — PANTOPRAZOLE SODIUM 20 MG PO TBEC
20.0000 mg | DELAYED_RELEASE_TABLET | Freq: Every day | ORAL | Status: DC
  Filled 2018-05-29 (×4): qty 1

## 2018-05-29 MED ORDER — SCOPOLAMINE 1 MG/3DAYS TD PT72: 1.0000 | MEDICATED_PATCH | Freq: Once | TRANSDERMAL | Status: DC

## 2018-05-29 MED ORDER — SODIUM CHLORIDE 0.9 % IR SOLN
Status: DC | PRN
  Administered 2018-05-29: 1

## 2018-05-29 MED ORDER — SIMETHICONE 80 MG PO CHEW
80.0000 mg | CHEWABLE_TABLET | ORAL | Status: DC
  Administered 2018-05-30 (×2): 80 mg via ORAL
  Filled 2018-05-29 (×2): qty 1

## 2018-05-29 MED ORDER — OXYCODONE HCL 5 MG PO TABS
5.0000 mg | ORAL_TABLET | Freq: Once | ORAL | Status: AC | PRN
  Administered 2018-05-29: 5 mg via ORAL

## 2018-05-29 MED ORDER — MORPHINE SULFATE (PF) 0.5 MG/ML IJ SOLN
INTRAMUSCULAR | Status: AC
  Filled 2018-05-29: qty 10

## 2018-05-29 MED ORDER — SOD CITRATE-CITRIC ACID 500-334 MG/5ML PO SOLN
30.0000 mL | Freq: Once | ORAL | Status: AC
  Administered 2018-05-29: 30 mL via ORAL
  Filled 2018-05-29: qty 15

## 2018-05-29 MED ORDER — OXYCODONE HCL 5 MG/5ML PO SOLN: 5.0000 mg | Freq: Once | ORAL | Status: AC | PRN

## 2018-05-29 MED ORDER — SIMETHICONE 80 MG PO CHEW: 80.0000 mg | CHEWABLE_TABLET | ORAL | Status: DC | PRN

## 2018-05-29 MED ORDER — FENTANYL CITRATE (PF) 100 MCG/2ML IJ SOLN
INTRAMUSCULAR | Status: DC | PRN
  Administered 2018-05-29: 15 ug via INTRATHECAL

## 2018-05-29 MED ORDER — IBUPROFEN 800 MG PO TABS
800.0000 mg | ORAL_TABLET | Freq: Four times a day (QID) | ORAL | Status: DC
  Administered 2018-05-29 – 2018-05-31 (×8): 800 mg via ORAL
  Filled 2018-05-29 (×8): qty 1

## 2018-05-29 MED ORDER — ACETAMINOPHEN 500 MG PO TABS
1000.0000 mg | ORAL_TABLET | Freq: Four times a day (QID) | ORAL | Status: DC | PRN
  Administered 2018-05-29 – 2018-05-31 (×8): 1000 mg via ORAL
  Filled 2018-05-29 (×8): qty 2

## 2018-05-29 MED ORDER — FENTANYL CITRATE (PF) 100 MCG/2ML IJ SOLN
INTRAMUSCULAR | Status: AC
  Filled 2018-05-29: qty 2

## 2018-05-29 MED ORDER — POLYETHYLENE GLYCOL 3350 17 G PO PACK
17.0000 g | PACK | Freq: Every day | ORAL | Status: DC | PRN
  Filled 2018-05-29: qty 1

## 2018-05-29 MED ORDER — PHENYLEPHRINE 8 MG IN D5W 100 ML (0.08MG/ML) PREMIX OPTIME
INJECTION | INTRAVENOUS | Status: AC
  Filled 2018-05-29: qty 100

## 2018-05-29 MED ORDER — DEXAMETHASONE SODIUM PHOSPHATE 10 MG/ML IJ SOLN
INTRAMUSCULAR | Status: DC | PRN
  Administered 2018-05-29: 10 mg via INTRAVENOUS

## 2018-05-29 MED ORDER — DOCUSATE SODIUM 100 MG PO CAPS
200.0000 mg | ORAL_CAPSULE | Freq: Every day | ORAL | Status: DC
  Administered 2018-05-29 – 2018-05-31 (×3): 200 mg via ORAL
  Filled 2018-05-29 (×3): qty 2

## 2018-05-29 MED ORDER — DEXAMETHASONE SODIUM PHOSPHATE 10 MG/ML IJ SOLN
INTRAMUSCULAR | Status: AC
  Filled 2018-05-29: qty 1

## 2018-05-29 MED ORDER — NALBUPHINE HCL 10 MG/ML IJ SOLN: 5.0000 mg | INTRAMUSCULAR | Status: DC | PRN

## 2018-05-29 MED ORDER — LACTATED RINGERS IV SOLN
INTRAVENOUS | Status: DC
  Administered 2018-05-29: 13:00:00 via INTRAVENOUS

## 2018-05-29 MED ORDER — MENTHOL 3 MG MT LOZG: 1.0000 | LOZENGE | OROMUCOSAL | Status: DC | PRN

## 2018-05-29 MED ORDER — FAMOTIDINE IN NACL 20-0.9 MG/50ML-% IV SOLN
20.0000 mg | Freq: Once | INTRAVENOUS | Status: AC
  Administered 2018-05-29: 20 mg via INTRAVENOUS
  Filled 2018-05-29: qty 50

## 2018-05-29 MED ORDER — ONDANSETRON HCL 4 MG/2ML IJ SOLN
INTRAMUSCULAR | Status: AC
  Filled 2018-05-29: qty 2

## 2018-05-29 MED ORDER — MONTELUKAST SODIUM 10 MG PO TABS
10.0000 mg | ORAL_TABLET | Freq: Every day | ORAL | Status: DC
  Administered 2018-05-29 – 2018-05-30 (×2): 10 mg via ORAL
  Filled 2018-05-29 (×3): qty 1

## 2018-05-29 MED ORDER — ZOLPIDEM TARTRATE 5 MG PO TABS: 5.0000 mg | ORAL_TABLET | Freq: Every evening | ORAL | Status: DC | PRN

## 2018-05-29 MED ORDER — SODIUM CHLORIDE 0.9% FLUSH: 3.0000 mL | INTRAVENOUS | Status: DC | PRN

## 2018-05-29 MED ORDER — NALBUPHINE HCL 10 MG/ML IJ SOLN: 5.0000 mg | Freq: Once | INTRAMUSCULAR | Status: DC | PRN

## 2018-05-29 MED ORDER — KETOROLAC TROMETHAMINE 30 MG/ML IJ SOLN
30.0000 mg | Freq: Once | INTRAMUSCULAR | Status: AC
  Administered 2018-05-29: 30 mg via INTRAVENOUS

## 2018-05-29 MED ORDER — LACTATED RINGERS IV SOLN
INTRAVENOUS | Status: DC
  Administered 2018-05-29 (×3): via INTRAVENOUS

## 2018-05-29 MED ORDER — OXYTOCIN 40 UNITS IN LACTATED RINGERS INFUSION - SIMPLE MED
2.5000 [IU]/h | INTRAVENOUS | Status: AC
  Administered 2018-05-29: 2.5 [IU]/h via INTRAVENOUS

## 2018-05-29 MED ORDER — LEVALBUTEROL HCL 1.25 MG/0.5ML IN NEBU
1.2500 mg | INHALATION_SOLUTION | Freq: Four times a day (QID) | RESPIRATORY_TRACT | Status: DC | PRN
  Filled 2018-05-29: qty 0.5

## 2018-05-29 MED ORDER — DOXYLAMINE SUCCINATE (SLEEP) 25 MG PO TABS
12.5000 mg | ORAL_TABLET | Freq: Every evening | ORAL | Status: DC | PRN
  Administered 2018-05-29: 12.5 mg via ORAL
  Filled 2018-05-29 (×3): qty 1

## 2018-05-29 MED ORDER — OXYCODONE HCL 5 MG PO TABS
5.0000 mg | ORAL_TABLET | ORAL | Status: DC | PRN
  Administered 2018-05-29 – 2018-05-31 (×9): 5 mg via ORAL
  Filled 2018-05-29 (×9): qty 1

## 2018-05-29 MED ORDER — HYDROMORPHONE HCL 1 MG/ML IJ SOLN
INTRAMUSCULAR | Status: AC
  Filled 2018-05-29: qty 0.5

## 2018-05-29 MED ORDER — WITCH HAZEL-GLYCERIN EX PADS: 1.0000 "application " | MEDICATED_PAD | CUTANEOUS | Status: DC | PRN

## 2018-05-29 MED ORDER — ONDANSETRON HCL 4 MG/2ML IJ SOLN
INTRAMUSCULAR | Status: DC | PRN
  Administered 2018-05-29: 4 mg via INTRAVENOUS

## 2018-05-29 MED ORDER — OXYTOCIN 10 UNIT/ML IJ SOLN
INTRAVENOUS | Status: DC | PRN
  Administered 2018-05-29: 40 [IU] via INTRAVENOUS

## 2018-05-29 MED ORDER — PROMETHAZINE HCL 25 MG/ML IJ SOLN: 6.2500 mg | INTRAMUSCULAR | Status: DC | PRN

## 2018-05-29 MED ORDER — BUDESONIDE-FORMOTEROL FUMARATE 160-4.5 MCG/ACT IN AERO
2.0000 | INHALATION_SPRAY | Freq: Two times a day (BID) | RESPIRATORY_TRACT | Status: DC
  Administered 2018-05-29 – 2018-05-31 (×5): 2 via RESPIRATORY_TRACT

## 2018-05-29 MED ORDER — OXYTOCIN 40 UNITS IN LACTATED RINGERS INFUSION - SIMPLE MED
INTRAVENOUS | Status: AC
  Filled 2018-05-29: qty 1000

## 2018-05-29 MED ORDER — MAGNESIUM OXIDE 400 (241.3 MG) MG PO TABS
400.0000 mg | ORAL_TABLET | Freq: Every day | ORAL | Status: DC
  Administered 2018-05-29 – 2018-05-31 (×3): 400 mg via ORAL
  Filled 2018-05-29 (×4): qty 1

## 2018-05-29 MED ORDER — ONDANSETRON HCL 4 MG/2ML IJ SOLN: 4.0000 mg | Freq: Three times a day (TID) | INTRAMUSCULAR | Status: DC | PRN

## 2018-05-29 MED ORDER — ACETAMINOPHEN 10 MG/ML IV SOLN: 1000.0000 mg | Freq: Once | INTRAVENOUS | Status: DC | PRN

## 2018-05-29 MED ORDER — FLUTICASONE PROPIONATE 50 MCG/ACT NA SUSP
2.0000 | Freq: Every day | NASAL | Status: DC
  Administered 2018-05-29 – 2018-05-30 (×2): 2 via NASAL
  Filled 2018-05-29: qty 16

## 2018-05-29 MED ORDER — OXYTOCIN 10 UNIT/ML IJ SOLN
INTRAMUSCULAR | Status: AC
  Filled 2018-05-29: qty 4

## 2018-05-29 MED ORDER — DIPHENHYDRAMINE HCL 25 MG PO CAPS: 25.0000 mg | ORAL_CAPSULE | Freq: Four times a day (QID) | ORAL | Status: DC | PRN

## 2018-05-29 MED ORDER — PRENATAL MULTIVITAMIN CH: 3.0000 | ORAL_TABLET | Freq: Every day | ORAL | Status: DC

## 2018-05-29 MED ORDER — CEFAZOLIN SODIUM-DEXTROSE 2-4 GM/100ML-% IV SOLN
2.0000 g | INTRAVENOUS | Status: AC
  Administered 2018-05-29: 2 g via INTRAVENOUS
  Filled 2018-05-29: qty 100

## 2018-05-29 SURGICAL SUPPLY — 39 items
APL SKNCLS STERI-STRIP NONHPOA (GAUZE/BANDAGES/DRESSINGS) ×1
BENZOIN TINCTURE PRP APPL 2/3 (GAUZE/BANDAGES/DRESSINGS) ×3 IMPLANT
CHLORAPREP W/TINT 26ML (MISCELLANEOUS) ×3 IMPLANT
CLAMP CORD UMBIL (MISCELLANEOUS) IMPLANT
CLOSURE STERI STRIP 1/2 X4 (GAUZE/BANDAGES/DRESSINGS) ×2 IMPLANT
CLOSURE WOUND 1/2 X4 (GAUZE/BANDAGES/DRESSINGS)
CLOTH BEACON ORANGE TIMEOUT ST (SAFETY) ×3 IMPLANT
DRSG OPSITE POSTOP 4X10 (GAUZE/BANDAGES/DRESSINGS) ×3 IMPLANT
ELECT REM PT RETURN 9FT ADLT (ELECTROSURGICAL) ×3
ELECTRODE REM PT RTRN 9FT ADLT (ELECTROSURGICAL) ×1 IMPLANT
EXTRACTOR VACUUM KIWI (MISCELLANEOUS) ×3 IMPLANT
GLOVE BIOGEL PI IND STRL 6.5 (GLOVE) ×1 IMPLANT
GLOVE BIOGEL PI IND STRL 7.0 (GLOVE) ×2 IMPLANT
GLOVE BIOGEL PI INDICATOR 6.5 (GLOVE) ×2
GLOVE BIOGEL PI INDICATOR 7.0 (GLOVE) ×4
GLOVE ECLIPSE 6.5 STRL STRAW (GLOVE) ×3 IMPLANT
GOWN STRL REUS W/TWL LRG LVL3 (GOWN DISPOSABLE) ×6 IMPLANT
KIT ABG SYR 3ML LUER SLIP (SYRINGE) IMPLANT
NDL HYPO 25X5/8 SAFETYGLIDE (NEEDLE) IMPLANT
NEEDLE HYPO 25X5/8 SAFETYGLIDE (NEEDLE) IMPLANT
NS IRRIG 1000ML POUR BTL (IV SOLUTION) ×3 IMPLANT
PACK C SECTION WH (CUSTOM PROCEDURE TRAY) ×3 IMPLANT
PAD ABD 7.5X8 STRL (GAUZE/BANDAGES/DRESSINGS) ×2 IMPLANT
PAD OB MATERNITY 4.3X12.25 (PERSONAL CARE ITEMS) ×3 IMPLANT
PENCIL SMOKE EVAC W/HOLSTER (ELECTROSURGICAL) ×3 IMPLANT
SPONGE GAUZE 4X4 12PLY STER LF (GAUZE/BANDAGES/DRESSINGS) ×2 IMPLANT
SPONGE LAP 18X18 RF (DISPOSABLE) ×9 IMPLANT
STRIP CLOSURE SKIN 1/2X4 (GAUZE/BANDAGES/DRESSINGS) IMPLANT
SUT MNCRL 0 VIOLET CTX 36 (SUTURE) ×2 IMPLANT
SUT MONOCRYL 0 CTX 36 (SUTURE) ×4
SUT PDS AB 0 CTX 36 PDP370T (SUTURE) IMPLANT
SUT PLAIN 2 0 XLH (SUTURE) IMPLANT
SUT VIC AB 0 CT1 36 (SUTURE) IMPLANT
SUT VIC AB 2-0 CT1 27 (SUTURE) ×3
SUT VIC AB 2-0 CT1 TAPERPNT 27 (SUTURE) IMPLANT
SUT VIC AB 4-0 PS2 27 (SUTURE) ×3 IMPLANT
TOWEL OR 17X24 6PK STRL BLUE (TOWEL DISPOSABLE) ×3 IMPLANT
TRAY FOLEY W/BAG SLVR 14FR LF (SET/KITS/TRAYS/PACK) IMPLANT
WATER STERILE IRR 1000ML POUR (IV SOLUTION) ×2 IMPLANT

## 2018-05-29 NOTE — MAU Note (Signed)
Had a large gush of fluid around 0015, clear fluid.  No VB.  Scheduled csection for 10/29. +FM.

## 2018-05-29 NOTE — Lactation Note (Signed)
This note was copied from a baby's chart. Lactation Consultation Note; Initial visit. Experienced BF mom. Reports baby has just fed for 15 min and is getting ready to put her to the other breast. Baby latched well and mom reports no pain with latch. Encouraged her to hold her breast throughout the feeding. Nipple round when baby came off the breast. BF brochure given. Reviewed our phone number, OP appointments and BFSG as resources for support after DC. No questions at present. To call for assist prn  Patient Name: Adrienne Williams [id number] Date: 05/29/2018 Reason for consult: Initial assessment   Maternal Data Formula Feeding for Exclusion: No Has patient been taught Hand Expression?: Yes Does the patient have breastfeeding experience prior to this delivery?: Yes  Feeding Feeding Type: Breast Fed  LATCH Score Latch: Grasps breast easily, tongue down, lips flanged, rhythmical sucking.  Audible Swallowing: A few with stimulation  Type of Nipple: Everted at rest and after stimulation  Comfort (Breast/Nipple): Soft / non-tender  Hold (Positioning): Assistance needed to correctly position infant at breast and maintain latch.  LATCH Score: 8  Interventions Interventions: Breast feeding basics reviewed;Support pillows  Lactation Tools Discussed/Used     Consult Status Consult Status: Follow-up Date: 05/30/18 Follow-up type: In-patient    Pamelia Hoit 05/29/2018, 10:57 AM

## 2018-05-29 NOTE — H&P (Signed)
OB ADMISSION/ HISTORY & PHYSICAL:  Admission Date: 05/29/2018  1:10 AM  Admit Diagnosis: 38+2 weeks spontaneous rupture of membranes, breech presentation  Adrienne Williams is a 37 y.o. female G3P1 at 38+2 weeks presenting for spontaneous rupture of membranes and is known breech presentation. Confirmed by [place] in MAU by MAU provider.   Prenatal History: G3P1011   EDC : 06/10/2018, Date entered prior to episode creation  Prenatal care at [facility] since 10 weeks Primary Ob Provider: CNM Management Prenatal course complicated by  - Marginal cord insertion  - Anxiety and Depression on Wellbutrin 300mg XL - Previous LTCS for breech  - Asthma   Prenatal Labs: ABO, Rh: O/Negative/-- (04/11 0000) Antibody: n (04/11 0000) Rubella: Immune (04/11 0000)  RPR: Nonreactive (04/11 0000)  HBsAg: Negative (04/11 0000)  HIV: Non-reactive (04/11 0000)  GTT: 118 GBS:   Negative Informaseq WNL AFP-1 WNL Normal CUS, marginal cord insertion, posterior placenta Last [place] @35  weeks: AGA 6#3, AFI  15.3cm, Homero Fellers breech Medical / Surgical History :  Past medical history:  Past Medical History:  Diagnosis Date  . Anxiety   . Asthma   . Depression   . Hemorrhoids   . Hx of varicella   . PONV (postoperative nausea and vomiting)    "little nauseated"  . Postpartum care following cesarean delivery (6/14) 01/18/2016     Past surgical history:  Past Surgical History:  Procedure Laterality Date  . CESAREAN SECTION N/A 01/18/2016   Procedure: Primary CESAREAN SECTION;  Surgeon: Genia Del, MD;  Location: [facility];  Service: Obstetrics;  Laterality: N/A;  EDD: 01/25/16  . CYSTECTOMY  2002   pilonidal region  . MOLE REMOVAL    . WISDOM TOOTH EXTRACTION      Family History:  Family History  Problem Relation Age of Onset  . Cancer Maternal Grandmother        stomach  . Hypothyroidism Paternal Grandmother   . Atrial fibrillation Paternal Grandmother   . Angina Paternal  Grandfather   . Hypertension Paternal Grandfather   . Hyperlipidemia Paternal Grandfather      Social History:  reports that she has never smoked. She has never used smokeless tobacco. She reports that she does not drink alcohol or use drugs.   Allergies: Benadryl [diphenhydramine] and Prednisone    Current Medications at time of admission:  Prior to Admission medications   Medication Sig Start Date End Date Taking? Authorizing Provider  acetaminophen (TYLENOL) 500 MG tablet Take 1,000 mg by mouth every 6 (six) hours as needed for moderate pain.   Yes [provider]  budesonide-formoterol (SYMBICORT) 160-4.5 MCG/ACT inhaler Inhale 2 puffs into the lungs 2 (two) times daily.   Yes [provider]  buPROPion (WELLBUTRIN XL) 300 MG 24 hr tablet Take 300 mg by mouth daily.   Yes [provider]  cetirizine (ZYRTEC) 10 MG tablet Take 10 mg by mouth daily.   Yes [provider]  docusate sodium (COLACE) 100 MG capsule Take 200 mg by mouth daily.    Yes [provider]  doxylamine, Sleep, (UNISOM) 25 MG tablet Take 12.5 mg by mouth at bedtime as needed for sleep.    Yes [provider]  fluticasone (FLONASE) 50 MCG/ACT nasal spray Place 2 sprays into both nostrils daily.   Yes [provider]  lansoprazole (PREVACID) 15 MG capsule Take 15 mg by mouth daily.    Yes [provider]  levalbuterol (XOPENEX HFA) 45 MCG/ACT inhaler Inhale 2 puffs  into the lungs every 6 (six) hours as needed for wheezing.   Yes [provider]  magnesium oxide (MAG-OX) 400 (241.3 Mg) MG tablet Take 1 tablet (400 mg total) by mouth daily. Patient taking differently: Take 800 mg by mouth daily.  01/21/16  Yes Marlinda Mike, CNM  montelukast (SINGULAIR) 10 MG tablet Take 10 mg by mouth at bedtime.   Yes [provider]  NON FORMULARY Allergy shots once every 2 weeks   Yes [provider]  Omega-3 Fatty Acids (FISH OIL) 1000  MG CAPS Take 1 capsule by mouth daily.   Yes [provider]  polyethylene glycol (MIRALAX / GLYCOLAX) packet Take 17 g by mouth daily as needed for moderate constipation.   Yes [provider]  Prenatal Vit-Fe Fumarate-FA (PRENATAL MULTIVITAMIN) TABS tablet Take 3 tablets by mouth daily.    Yes [provider]     Review of Systems: Active FM No painful contractions, occasional BH contractions  LOF  / SROM @ 12:30am clear fluid No bloody show    Physical Exam:  VS: Blood pressure 120/83, pulse 93, temperature 98.2 F (36.8 C), resp. rate 19, height 5\' 3"  (1.6 m), weight 77.3 kg, SpO2 98 %, unknown if currently breastfeeding.  General: alert and oriented, appears calm Heart: RRR Lungs: Clear lung fields Abdomen: Gravid, soft and non-tender, non-distended / uterus: gravid, nontender; confirmed breech by sono by MAU provider  Extremities: no edema  Genitalia / VE:  deferred Fern +  FHR: baseline rate 140 bpm / variability moderate / accelerations + / no decelerations TOCO: occasional contraction  Assessment: 38+[redacted] weeks gestation SROM GBS Negative Breech presentation Depression and Anxiety  FHR category 1   Plan:  1. Admit to OR for repeat LTCS for Breech   - Routine pre-op orders   - Consent signed   - Risks of the procedure reviewed including risks of bleeding, infection, and injury to surrounding organs  2. GBS Negative 3. Postpartum:   - Breast feeding   Dr Juliene Pina notified of admission / plan of care - in route for repeat LTCS   Carlean Jews, MSN, CNM [facility]

## 2018-05-29 NOTE — MAU Provider Note (Signed)
Pt informed that the ultrasound is considered a limited OB ultrasound and is not intended to be a complete ultrasound exam.  Patient also informed that the ultrasound is not being completed with the intent of assessing for fetal or placental anomalies or any pelvic abnormalities.  Explained that the purpose of today's ultrasound is to assess for presentation of fetus.  Patient acknowledges the purpose of the exam and the limitations of the study.    Fetus is noted to be in the complete breech position, with head toward fundus in center.  Pockets of fluid visualized but not measured

## 2018-05-29 NOTE — Op Note (Signed)
Cesarean Section Procedure Note   Adrienne Williams  05/29/2018  Indications: Breech Presentation, ruptured membranes, 38.2 weeks   Pre-operative Diagnosis: Repeat Cesarean Section: breach presentation with SROM  Post-operative Diagnosis: Same   Surgeon: Shea Evans, MD  Assistants: Carlean Jews CNM  Anesthesia: spinal   Procedure Details:  The patient was seen in the Holding Room. The risks, benefits, complications, treatment options, and expected outcomes were discussed with the patient. The patient concurred with the proposed plan, giving informed consent. identified as [name] and the procedure verified as C-Section Delivery. A Time Out was held and the above information confirmed. 2 gm Ancef given.  After induction of anesthesia, the patient was draped and prepped in the usual sterile manner. A Pfannenstiel Incision was made and carried down through the subcutaneous tissue to the fascia. Fascial incision was made and extended transversely. The fascia was separated from the underlying rectus tissue superiorly and inferiorly. The peritoneum was identified and entered. Peritoneal incision was extended longitudinally. [name] retractor placed. Lower segment scar area was noted to be very thin and fetal buttocks could be seen noting partial dehiscence.  A low transverse uterine incision was made. Small amount of clear amniotic fluid drained. Baby was [employer]. Buttocks delivered and then legs by flexion of knees followed by body, then sweeping arms over the chest and then head by flexion as complete breech extraction. Apgar scores of 8 at one minute and 9 at five minutes. Delayed cord clamping at 1 minute and infant handed to NICU team. Cord ph was not sent. Cord blood was obtained for evaluation. The placenta was removed Intact and appeared normal. The uterine cavity and outline, tubes and ovaries appeared normal. The uterine incision was closed with running locked sutures  of followed by another imbricating layer. Hemostasis was observed. [name] removed. Peritoneal closure and muscles approximated with 2-0 Vicryl. The fascia was then reapproximated with running sutures of 0Vicryl. The subcuticular closure was performed using 2-0plain gut. The skin was closed with 4-0Vicryl. Sterile dressings placed.   Instrument, sponge, and needle counts were correct prior the abdominal closure and were correct at the conclusion of the case.   Findings: Thin lower segment with partial dehiscence of scar. Baby delivered at 3.35 AM by complete breech extraction from low transverse hysterotomy. 2 layer closure done. Apgars 8, 9.    Estimated Blood Loss: 300 mL   Total IV Fluids: 2500 cc LR   Urine Output: 200CC OF clear urine  Specimens: cord blood    Complications: no complications  Disposition: PACU - hemodynamically stable.   Maternal Condition: stable   Baby condition / location:  Couplet care / Skin to Skin  Attending Attestation: I performed the procedure.   Signed: Surgeon(s): Shea Evans, MD

## 2018-05-29 NOTE — Plan of Care (Signed)
Today we are working on pain control, ambulation and breast feeding.  Mom is slowly progressing

## 2018-05-29 NOTE — MAU Note (Addendum)
OR care coordinator notified. Anesthesiologist notified.

## 2018-05-29 NOTE — Anesthesia Preprocedure Evaluation (Signed)
Anesthesia Evaluation  Patient identified by MRN, date of birth, ID band Patient awake    Reviewed: Allergy & Precautions, NPO status , Patient's Chart, lab work & pertinent test results  History of Anesthesia Complications (+) PONV and history of anesthetic complications  Airway Mallampati: II  TM Distance: >3 FB Neck ROM: Full    Dental no notable dental hx.    Pulmonary asthma ,    Pulmonary exam normal breath sounds clear to auscultation       Cardiovascular negative cardio ROS Normal cardiovascular exam Rhythm:Regular Rate:Normal     Neuro/Psych PSYCHIATRIC DISORDERS Anxiety Depression negative neurological ROS     GI/Hepatic Neg liver ROS, GERD  Medicated,  Endo/Other  negative endocrine ROS  Renal/GU negative Renal ROS     Musculoskeletal negative musculoskeletal ROS (+)   Abdominal (+) + obese,   Peds  Hematology negative hematology ROS (+)   Anesthesia Other Findings Previous Cesarean Section Breech Ruptured membranes  Reproductive/Obstetrics (+) Pregnancy                             Anesthesia Physical Anesthesia Plan  ASA: II and emergent  Anesthesia Plan: Spinal   Post-op Pain Management:    Induction: Intravenous  PONV Risk Score and Plan: 3 and Scopolamine patch - Pre-op, Dexamethasone, Ondansetron and Treatment may vary due to age or medical condition  Airway Management Planned: Natural Airway  Additional Equipment:   Intra-op Plan:   Post-operative Plan:   Informed Consent: I have reviewed the patients History and Physical, chart, labs and discussed the procedure including the risks, benefits and alternatives for the proposed anesthesia with the patient or authorized representative who has indicated his/her understanding and acceptance.   Dental advisory given  Plan Discussed with: CRNA  Anesthesia Plan Comments:         Anesthesia Quick  Evaluation

## 2018-05-29 NOTE — Progress Notes (Signed)
MOB crying and wants to sleep but has been unable to fall asleep.  Asked if she needed pain medication.  Pt denied pain medication and would request that baby go to nursery tonight for several hours to allow pt to rest

## 2018-05-29 NOTE — Anesthesia Postprocedure Evaluation (Signed)
Anesthesia Post Note  Patient: Adrienne Williams  Procedure(s) Performed: Repeat CESAREAN SECTION (N/A )     Patient location during evaluation: Mother Baby Anesthesia Type: Spinal Level of consciousness: awake and alert and oriented Pain management: satisfactory to patient Vital Signs Assessment: post-procedure vital signs reviewed and stable Respiratory status: respiratory function stable and spontaneous breathing Cardiovascular status: blood pressure returned to baseline Postop Assessment: no headache, no backache, spinal receding, patient able to bend at knees and adequate PO intake Anesthetic complications: no    Last Vitals:  Vitals:   05/29/18 0644 05/29/18 0803  BP: 115/73 (!) 140/92  Pulse: 98 (!) 112  Resp: 18 18  Temp: 37.3 C 36.9 C  SpO2: 96% 96%    Last Pain:  Vitals:   05/29/18 0647  TempSrc:   PainSc: 3    Pain Goal:                 Abdiel Blackerby

## 2018-05-29 NOTE — Anesthesia Procedure Notes (Signed)
Spinal  Patient location during procedure: OR Start time: 05/29/2018 3:05 AM End time: 05/29/2018 3:10 AM Staffing Anesthesiologist: Leonides Grills, MD Performed: anesthesiologist  Preanesthetic Checklist Completed: patient identified, surgical consent, pre-op evaluation, timeout performed, IV checked, risks and benefits discussed and monitors and equipment checked Spinal Block Patient position: sitting Prep: DuraPrep Patient monitoring: cardiac monitor, continuous pulse ox and blood pressure Approach: midline Location: L4-5 Injection technique: single-shot Needle Needle type: Pencan  Needle gauge: 24 G Needle length: 9 cm Assessment Sensory level: T10 Additional Notes Functioning IV was confirmed and monitors were applied. Sterile prep and drape, including hand hygiene and sterile gloves were used. The patient was positioned and the spine was prepped. The skin was anesthetized with lidocaine.  Free flow of clear CSF was obtained prior to injecting local anesthetic into the CSF.  The spinal needle aspirated freely following injection.  The needle was carefully withdrawn.  The patient tolerated the procedure well.

## 2018-05-29 NOTE — Transfer of Care (Signed)
Immediate Anesthesia Transfer of Care Note  Patient: Adrienne Williams  Procedure(s) Performed: Repeat CESAREAN SECTION (N/A )  Patient Location: PACU  Anesthesia Type:Spinal  Level of Consciousness: awake, alert , oriented and patient cooperative  Airway & Oxygen Therapy: Patient Spontanous Breathing  Post-op Assessment: Report given to RN, Post -op Vital signs reviewed and stable and Patient moving all extremities X 4  Post vital signs: Reviewed and stable  Last Vitals:  Vitals Value Taken Time  BP    Temp    Pulse    Resp    SpO2      Last Pain:  Vitals:   05/29/18 0118  PainSc: 0-No pain         Complications: No apparent anesthesia complications

## 2018-05-29 NOTE — Progress Notes (Signed)
POSTOPERATIVE DAY # 0 S/P CS-breech Interval note   S:         Reports feeling tired and exhausted             Tolerating po intake / no nausea / no vomiting              Bleeding is light             Pain controlled          O:  VS: BP (!) 140/92 Comment: will recheck  Pulse (!) 112   Temp 98.5 F (36.9 C)   Resp 18   Ht 5\' 3"  (1.6 m)   Wt 77.3 kg   SpO2 96%   BMI 30.20 kg/m    Physical Exam:             Alert and Oriented X3             Fundus: firm, non-tender, Ueven             Dressing intact    Lochia: light  Extremities: SCD in place  A:        POD # 0 S/P CS-breech / previous c-section-repeat             P:        Routine postoperative care                Marlinda Mike CNM, MSN, [facility] 05/29/2018, 9:14 AM

## 2018-05-29 NOTE — Progress Notes (Signed)
MOB was referred for history of depression/anxiety. * Referral screened out by Clinical Social Worker because none of the following criteria appear to apply: ~ History of anxiety/depression during this pregnancy, or of post-partum depression following prior delivery. ~ Diagnosis of anxiety and/or depression within last 3 years OR * MOB's symptoms currently being treated with medication and/or therapy. Please contact the Clinical Social Worker if needs arise, by MOB request, or if MOB scores greater than 9/yes to question 10 on Edinburgh Postpartum Depression Screen.  Delyla Sandeen, LCSW Clinical Social Worker  [facility] [employer]  [phone]  

## 2018-05-30 NOTE — Progress Notes (Signed)
Doing well, was able to sleep and feeling much better No c/o, pain controlled, no n/v, tol po; ambulating; +flatus; voiding w/o difficulty  Patient Vitals for the past 24 hrs:  BP Temp Temp src Pulse Resp SpO2  05/30/18 0700 111/78 98.7 F (37.1 C) - 95 18 95 %  05/30/18 0530 - - - - - 98 %  05/30/18 0330 - - - - - 98 %  05/30/18 0130 131/81 98.3 F (36.8 C) Oral 96 20 99 %  05/29/18 2142 107/85 (!) 97.4 F (36.3 C) Oral 80 18 99 %  05/29/18 2013 - - - - - 98 %  05/29/18 1805 103/73 98.4 F (36.9 C) Oral 86 18 99 %  05/29/18 1548 105/69 99 F (37.2 C) Oral 93 18 99 %  05/29/18 1321 104/69 99.2 F (37.3 C) - 89 18 97 %   A&ox3 rrr ctab Abd: soft, nt, nd; fundus firm and 1cm below umb; +bs Dressing c/d/i LE: no edema, nt bilat  CBC Latest Ref Rng & Units 05/29/2018 05/29/2018 05/09/2018  WBC 4.0 - 10.5 K/uL 18.5(H) 13.1(H) 13.2(H)  Hemoglobin 12.0 - 15.0 g/dL 11.2(L) 12.1 12.0  Hematocrit 36.0 - 46.0 % 32.8(L) 35.0(L) 34.5(L)  Platelets 150 - 400 K/uL 252 272 293   A/p: pod 1 s/p rltcs for breech 1. Doing well, contin current care 2. Acute anemia - plan iron rich foods pp 3. H/o depression and h/o pp depression - stable, follow closely 4. Rh neg, rubella immune,

## 2018-05-31 ENCOUNTER — Encounter (HOSPITAL_COMMUNITY): Payer: Self-pay | Admitting: *Deleted

## 2018-05-31 DIAGNOSIS — O26899 Other specified pregnancy related conditions, unspecified trimester: Secondary | ICD-10-CM

## 2018-05-31 DIAGNOSIS — Z6791 Unspecified blood type, Rh negative: Secondary | ICD-10-CM

## 2018-05-31 DIAGNOSIS — F329 Major depressive disorder, single episode, unspecified: Secondary | ICD-10-CM | POA: Diagnosis present

## 2018-05-31 DIAGNOSIS — F419 Anxiety disorder, unspecified: Secondary | ICD-10-CM

## 2018-05-31 MED ORDER — IBUPROFEN 800 MG PO TABS: 800.0000 mg | ORAL_TABLET | Freq: Four times a day (QID) | ORAL | 0 refills | Status: DC

## 2018-05-31 MED ORDER — COCONUT OIL OIL: 1.0000 "application " | TOPICAL_OIL | 0 refills | Status: DC | PRN

## 2018-05-31 MED ORDER — OXYCODONE HCL 10 MG PO TABS: 10.0000 mg | ORAL_TABLET | ORAL | 0 refills | Status: DC | PRN

## 2018-05-31 NOTE — Progress Notes (Signed)
Honeycomb dressing changed using sterile technique.

## 2018-05-31 NOTE — Discharge Summary (Signed)
OB Discharge Summary  Patient Name: Adrienne Williams DOB: 1981-01-01 MRN: [id number]  Date of admission: 05/29/2018 Delivering provider: MODY, VAISHALI   Date of discharge: 05/31/2018  Admitting diagnosis: 38wks water broke Intrauterine pregnancy: [redacted]w[redacted]d     Secondary diagnosis:Principal Problem:   Postpartum care following cesarean delivery (10/24) Active Problems:   Homero Fellers breech presentation   Status post repeat low transverse cesarean section   Anxiety and depression   Rh negative, maternal  - newborn Rh negative, no prophylaxis indicated    Discharge diagnosis:  Patient Active Problem List   Diagnosis Date Noted  . Anxiety and depression 05/31/2018  . Rh negative, maternal 05/31/2018  . Frank breech presentation 05/29/2018  . Status post repeat low transverse cesarean section 05/29/2018  . Postpartum care following cesarean delivery (10/24) 05/29/2018                                                                   Post partum procedures:none  Augmentation: NA Pain control: Spinal  Laceration:None  Episiotomy:None  Complications: None  Hospital course:  Sceduled C/S   Adrienne Williams at [redacted]w[redacted]d was admitted to the hospital 05/29/2018 for scheduled cesarean section with the following indication:Elective Repeat, Malpresentation and spontaneous rupture of membranes.  Membrane Rupture Time/Date: 05/29/2018 at 0015 Patient delivered a Viable infant.05/29/2018  Details of operation can be found in separate operative note.  Pateint had an uncomplicated postpartum course.  She is ambulating, tolerating a regular diet, passing flatus, and urinating well. Patient is discharged home in stable condition on  05/31/18         Physical exam  Vitals:   05/30/18 0700 05/30/18 2014 05/30/18 2136 05/31/18 0523  BP: 111/78 107/74 115/75 106/77  Pulse: 95 96 90 79  Resp: 18 19 18 16   Temp: 98.7 F (Adrienne.1 C) 97.9 F (36.6 C) 98.2 F (36.8 C) 97.7 F (36.5 C)  TempSrc:   Oral  Oral  SpO2: 95% 99%    Weight:      Height:       General: alert, cooperative and no distress Lochia: appropriate Uterine Fundus: firm Incision: Healing well with no significant drainage DVT Evaluation: No cords or calf tenderness. No significant calf/ankle edema. Labs: Lab Results  Component Value Date   WBC 18.5 (H) 05/29/2018   HGB 11.2 (L) 05/29/2018   HCT 32.8 (L) 05/29/2018   MCV 94.0 05/29/2018   PLT 252 05/29/2018   CMP Latest Ref Rng & Units 05/09/2018  Glucose 70 - 99 mg/dL 782(N)  BUN 6 - 20 mg/dL 13  Creatinine 5.62 - 1.30 mg/dL 8.65(H)  Sodium 846 - 962 mmol/L 134(L)  Potassium 3.5 - 5.1 mmol/L 4.2  Chloride 98 - 111 mmol/L 105  CO2 22 - 32 mmol/L 21(L)  Calcium 8.9 - 10.3 mg/dL 9.5(M)  Total Protein 6.5 - 8.1 g/dL 6.7  Total Bilirubin 0.3 - 1.2 mg/dL 0.4  Alkaline Phos 38 - 126 U/L 102  AST 15 - 41 U/L 19  ALT 0 - 44 U/L 22    Discharge instruction: per After Visit Summary and "Baby and Me Booklet".  After Visit Meds:  Allergies as of 05/31/2018      Reactions   Benadryl [diphenhydramine] Other (See Comments)   Makes skin  crawl - pt prefers not to take    Prednisone    Pt can not sleep - prefers not to take      Medication List    TAKE these medications   acetaminophen 500 MG tablet Commonly known as:  TYLENOL Take 1,000 mg by mouth every 6 (six) hours as needed for moderate pain.   budesonide-formoterol 160-4.5 MCG/ACT inhaler Commonly known as:  SYMBICORT Inhale 2 puffs into the lungs 2 (two) times daily.   buPROPion 300 MG 24 hr tablet Commonly known as:  WELLBUTRIN XL Take 300 mg by mouth daily.   cetirizine 10 MG tablet Commonly known as:  ZYRTEC Take 10 mg by mouth daily.   coconut oil Oil Apply 1 application topically as needed.   docusate sodium 100 MG capsule Commonly known as:  COLACE Take 200 mg by mouth daily.   doxylamine (Sleep) 25 MG tablet Commonly known as:  UNISOM Take 12.5 mg by mouth at bedtime as needed for  sleep.   Fish Oil 1000 MG Caps Take 1 capsule by mouth daily.   fluticasone 50 MCG/ACT nasal spray Commonly known as:  FLONASE Place 2 sprays into both nostrils daily.   ibuprofen 800 MG tablet Commonly known as:  ADVIL,MOTRIN Take 1 tablet (800 mg total) by mouth every 6 (six) hours.   lansoprazole 15 MG capsule Commonly known as:  PREVACID Take 15 mg by mouth daily.   levalbuterol 45 MCG/ACT inhaler Commonly known as:  XOPENEX HFA Inhale 2 puffs into the lungs every 6 (six) hours as needed for wheezing.   magnesium oxide 400 (241.3 Mg) MG tablet Commonly known as:  MAG-OX Take 1 tablet (400 mg total) by mouth daily. What changed:  how much to take   montelukast 10 MG tablet Commonly known as:  SINGULAIR Take 10 mg by mouth at bedtime.   NON FORMULARY Allergy shots once every 2 weeks   Oxycodone HCl 10 MG Tabs Take 1 tablet (10 mg total) by mouth every 4 (four) hours as needed (pain scale > 7).   polyethylene glycol packet Commonly known as:  MIRALAX / GLYCOLAX Take 17 g by mouth daily as needed for moderate constipation.   prenatal multivitamin Tabs tablet Take 3 tablets by mouth daily.   triamcinolone cream 0.1 % Commonly known as:  KENALOG Apply 1 application topically 2 (two) times daily as needed (For eczema.).       Diet: routine diet  Activity: Advance as tolerated. Pelvic rest for 6 weeks.   Postpartum contraception: desires IUD Paragard  Newborn Data: Live born female  Birth Weight: 7 lb 2.5 oz (3245 g) APGAR: 9, 9  Newborn Delivery   Birth date/time:  05/29/2018 03:35:00 Delivery type:  C-Section, Low Transverse Trial of labor:  No C-section categorization:  Repeat     named [name] Baby Feeding: Breast Disposition:home with mother   Delivery Report:  Review the Delivery Report for details.    Follow up: Follow-up Information    Marlinda Mike, CNM. Schedule an appointment as soon as possible for a visit in 6 week(s).    Specialty:  Obstetrics and Gynecology Contact information: [address] [place] [address] [phone]             Signed: Cipriano Mile, MSN 05/31/2018, 9:23 AM

## 2018-05-31 NOTE — Lactation Note (Signed)
This note was copied from a baby's chart. Lactation Consultation Note; Experienced BF mom reports breast feeding is going better today. Reports nipples are a little tender but intact. Baby waking. Mom easily latched baby by herself. Baby sleepy and off to sleep after 5 min. No questions at present. Reviewed our phone number, OP appointments and BFSG as resources for support after DC. To call prn Patient Name: Adrienne Williams [id number] Date: 05/31/2018 Reason for consult: Follow-up assessment   Maternal Data Formula Feeding for Exclusion: No Has patient been taught Hand Expression?: Yes Does the patient have breastfeeding experience prior to this delivery?: Yes  Feeding Feeding Type: Breast Fed  LATCH Score Latch: Grasps breast easily, tongue down, lips flanged, rhythmical sucking.  Audible Swallowing: A few with stimulation  Type of Nipple: Everted at rest and after stimulation  Comfort (Breast/Nipple): Soft / non-tender  Hold (Positioning): No assistance needed to correctly position infant at breast.  LATCH Score: 9  Interventions Interventions: Breast feeding basics reviewed  Lactation Tools Discussed/Used WIC Program: No   Consult Status Consult Status: Complete    Pamelia Hoit 05/31/2018, 11:09 AM

## 2018-05-31 NOTE — Progress Notes (Signed)
Patient requested not to be disturbed, do not disturb sign placed on door. Patient states she will call if she needs anything.

## 2018-06-02 ENCOUNTER — Encounter (HOSPITAL_COMMUNITY)
Admission: RE | Admit: 2018-06-02 | Discharge: 2018-06-02 | Disposition: A | Discharge status: Home / Self Care | Payer: Managed Care, Other (non HMO) | Source: Ambulatory Visit

## 2018-06-02 HISTORY — DX: Anxiety disorder, unspecified: F41.9

## 2018-06-02 HISTORY — DX: Unspecified hemorrhoids: K64.9

## 2018-06-03 ENCOUNTER — Inpatient Hospital Stay (HOSPITAL_COMMUNITY): Admit: 2018-06-03 | Payer: Self-pay | Admitting: Obstetrics and Gynecology

## 2019-01-27 ENCOUNTER — Telehealth: Payer: Managed Care, Other (non HMO) | Admitting: Physician Assistant

## 2019-01-27 DIAGNOSIS — J029 Acute pharyngitis, unspecified: Secondary | ICD-10-CM

## 2019-01-27 DIAGNOSIS — R197 Diarrhea, unspecified: Secondary | ICD-10-CM

## 2019-01-27 NOTE — Progress Notes (Signed)
E-Visit for Corona Virus Screening   Your current symptoms could be consistent with the coronavirus.  Call your health care provider or local health department to request and arrange formal testing. Many health care providers can now test patients at their office but not all are.  Please quarantine yourself while awaiting your test results.  [place] [phone], [place], [place] or visit [email or web address]     COVID-19 is a respiratory illness with symptoms that aresimilar to the flu. Symptoms are typically mild to moderate, but there have been cases of severe illness and death due to the virus. The following symptoms may appear 2-14 days after exposure: Fever Cough Shortness of breath or difficulty breathing Chills Repeated shaking with chills Muscle pain Headache Sore throat New loss of taste or smell Fatigue Congestion or runny nose Nausea or vomiting Diarrhea  It is vitally important that if you feel that you have an infection such as this virus or any other virus that you stay home and away from places where you may spread it to others.  You should self-quarantine for 14 days if you have symptoms that could potentially be coronavirus or have been in close contact a with a person diagnosed with COVID-19 within the last 2 weeks. You should avoid contact with people age 64 and older.   You should wear a mask or cloth face covering over your nose and mouth if you must be around other people or animals, including pets (even at home). Try to stay at least 6 feet away from other people. This will protect the people around you.    You may also take acetaminophen (Tylenol) as needed for fever.   Reduce your risk of any infection by using the same precautions used for avoiding the common cold or flu: Wash your hands often with  soap and warm water for at least 20 seconds.  If soap and water are not readily available, use an alcohol-based hand sanitizer with at least 60% alcohol.  If coughing or sneezing, cover your mouth and nose by coughing or sneezing into the elbow areas of your shirt or coat, into a tissue or into your sleeve (not your hands). Avoid shaking hands with others and consider head nods or verbal greetings only.  Avoid touching your eyes,nose, or mouth with unwashed hands. Avoid close contact with people who are sick. Avoid places or events with large numbers of people in one location, like concerts or sporting events. Carefully consider travel plans you have or are making. If you are planning any travel outside or inside the [place], visit the CDC'sTravelers' Health webpagefor the latest health notices. If you have some symptoms but not all symptoms, continue to monitor at home and seek medical attention if your symptoms worsen. If you are having a medical emergency, call 911.  HOME CARE Only take medications as instructed by your medical team. Drink plenty of fluids and get plenty of rest. A steam or ultrasonic humidifier can help if you have congestion.   GET HELP RIGHT AWAY IF YOU HAVE EMERGENCY WARNING SIGNS** FOR COVID-19. If you or someone is showing any of these signs seek emergency medical care immediately. Call 911 or proceed to your closest emergency facility if: You develop worsening high fever. Trouble breathing Bluish lips or face Persistent pain or pressure in the chest New confusion Inability to wake or stay awake You cough up blood. Your symptoms become more severe  **This list  is not all possible symptoms. Contact your medical provider for any symptoms that are sever or concerning to you.   MAKE SURE YOU  Understand these instructions. Will watch your condition. Will get help right away if you are not doing well or get worse.  Your e-visit answers were reviewed by a board  certified advanced clinical practitioner to complete your personal care plan.  Depending on the condition, your plan could have included both over the counter or prescription medications.  If there is a problem please reply once you have received a response from your provider.  Your safety is important to [place].  If you have drug allergies check your prescription carefully.    You can use MyChart to ask questions about today's visit, request a non-urgent call back, or ask for a work or school excuse for 24 hours related to this e-Visit. If it has been greater than 24 hours you will need to follow up with your provider, or enter a new e-Visit to address those concerns. You will get an e-mail in the next two days asking about your experience.  I hope that your e-visit has been valuable and will speed your recovery. Thank you for using e-visits.    ===View-only below this line===   ----- Message -----    From: Mosie Lukes    Sent: 01/27/2019  3:09 PM EDT      To: E-Visit Mailing List Subject: CoronaVirus (HFWYO-37) Screening  CoronaVirus (CHYIF-02) Screening --------------------------------  Question: Do you have any of the following?  Answer:   Shortness of breath            Cough  Question: If you are experiencing trouble breathing please select the severity of this:  Answer:   I have mild trouble breathing but not very often  Question: Do you have any of the following additional symptoms?  Answer:   Sore throat            Diarrhea  Question: Have you had a fever? Answer:   No  Question: Have others in your home or workplace had similar symptoms? Answer:   Yes  Question: When did your symptoms start? Answer:   End of May / beginning of June  Question: Have you recently visited any of the following countries? Answer:   None of these  Question: If you have traveled anywhere in the last  2 months please document where you have visited: Answer:     Question: Have you  recently been around others from these countries or visited these countries who have had coughing or fever? Answer:   No  Question: Have you recently been around anyone who has been diagnosed with Corona virus? Answer:   Yes  Question: Have you been taking any medications? Answer:   Yes  Question: If taking medications for these symptoms, please list the names and whether they are helping or not Answer:     Question: Are you treated for any of the following conditions: Asthma, COPD, Diabetes, Renal Failure (on Dialysis), AIDS, any Neuromuscular disease that effects the clearing of secretions, Heart Failure, or Heart Disease? Answer:   Yes  Question: Please enter a phone number where you can be reached if we have additional questions about your symptoms Answer:   [phone]  Question: Please list your medication allergies that you may have ? (If 'none' , please list as 'none') Answer:   None  Question: Please list any additional comments  Answer:  Question: Are you pregnant? Answer:   I am confident that I am not pregnant  Question: Are you breastfeeding? Answer:   Yes  A total of 5-10 minutes was spent evaluating this patients questionnaire and formulating a plan of care.

## 2019-11-12 ENCOUNTER — Encounter: Payer: Self-pay | Admitting: Physical Therapy

## 2019-11-12 ENCOUNTER — Ambulatory Visit: Payer: Managed Care, Other (non HMO) | Attending: Family Medicine | Admitting: Physical Therapy

## 2019-11-12 ENCOUNTER — Other Ambulatory Visit: Payer: Self-pay

## 2019-11-12 DIAGNOSIS — M6281 Muscle weakness (generalized): Secondary | ICD-10-CM

## 2019-11-12 DIAGNOSIS — M25511 Pain in right shoulder: Secondary | ICD-10-CM | POA: Insufficient documentation

## 2019-11-12 DIAGNOSIS — R293 Abnormal posture: Secondary | ICD-10-CM

## 2019-11-12 DIAGNOSIS — M25512 Pain in left shoulder: Secondary | ICD-10-CM | POA: Insufficient documentation

## 2019-11-12 DIAGNOSIS — M542 Cervicalgia: Secondary | ICD-10-CM | POA: Diagnosis present

## 2019-11-12 DIAGNOSIS — G8929 Other chronic pain: Secondary | ICD-10-CM

## 2019-11-12 NOTE — Patient Instructions (Signed)
Access Code: [email or web address] URL: [email or web address] Date: 11/12/2019 Prepared by: Rosana Hoes  Exercises Seated Cervical Retraction - 2 x daily - 7 x weekly - 10 reps - 3 seconds hold Standing Bilateral Low Shoulder Row with Anchored Resistance - 1 x daily - 7 x weekly - 3 sets - 10 reps Prone Scapular Slide with Shoulder Extension - 1 x daily - 7 x weekly - 2 sets - 10 reps - 3 seconds hold Prone T - 1 x daily - 7 x weekly - 2 sets - 10 reps - 3 seconds hold Gentle Upper Trap Stretch - 2 x daily - 7 x weekly - 2 reps - 15 seconds hold Gentle Levator Scapulae Stretch - 2 x daily - 7 x weekly - 2 reps - 15 seconds hold Doorway Pec Stretch at 90 Degrees Abduction - 2 x daily - 7 x weekly - 2 reps - 15 seconds hold

## 2019-11-12 NOTE — Therapy (Signed)
[facility] [address] [place], [place], [address] Phone: [phone]   Fax:  [phone]  Physical Therapy Evaluation  Patient Details  Name: Adrienne Williams MRN: [id number] Date of Birth: 11-22-1980 Referring Provider (PT): Juluis Rainier, MD   Encounter Date: 11/12/2019  PT End of Session - 11/12/19 1444    Visit Number  1    Number of Visits  4   patient scheduled 1st follow-up 2 weeks out   Date for PT Re-Evaluation  12/24/19    Authorization Type  [employer]    PT Start Time  1445    PT Stop Time  1530    PT Time Calculation (min)  45 min    Activity Tolerance  Patient tolerated treatment well    Behavior During Therapy  [facility] for tasks assessed/performed       Past Medical History:  Diagnosis Date  . Anxiety   . Asthma   . Depression   . Frank breech presentation 05/29/2018  . Hemorrhoids   . Hx of varicella   . PONV (postoperative nausea and vomiting)    "little nauseated"  . Postpartum care following cesarean delivery (6/14) 01/18/2016    Past Surgical History:  Procedure Laterality Date  . CESAREAN SECTION N/A 01/18/2016   Procedure: Primary CESAREAN SECTION;  Surgeon: Genia Del, MD;  Location: [facility];  Service: Obstetrics;  Laterality: N/A;  EDD: 01/25/16  . CESAREAN SECTION N/A 05/29/2018   Procedure: Repeat CESAREAN SECTION;  Surgeon: Shea Evans, MD;  Location: [facility] BIRTHING SUIT[facility];  Service: Obstetrics;  Laterality: N/A;  EDD: 06/10/18  . CYSTECTOMY  2002   pilonidal region  . MOLE REMOVAL    . WISDOM TOOTH EXTRACTION      There were no vitals filed for this visit.   Subjective Assessment - 11/12/19 1444    Subjective  Patient reports neck and shoulder pain from a lot of computer work with poor posutre. She feels she doesn't have great strength for her postural muscles. States that it is chronic pain and feels like she has trouble relaxing with a lot of tension. Has been going  on for years. She feels when the weather changes she feels more squeezing.    Pertinent History  Chronic pain    Limitations  Sitting;House hold activities   Working on computer   How long can you sit comfortably?  No limitation    How long can you stand comfortably?  No limitation    How long can you walk comfortably?  No limitation    Diagnostic tests  None    Patient Stated Goals  Patient wants to feel better and have tools to relieve pain    Currently in Pain?  Yes    Pain Score  5     Pain Location  Neck    Pain Orientation  Right;Left    Pain Descriptors / Indicators  --   Tension   Pain Type  Chronic pain    Pain Radiating Towards  Bilateral neck, upper trap region, upper back    Pain Onset  More than a month ago    Pain Frequency  Constant    Aggravating Factors   Working on computer for extended periods of time    Pain Relieving Factors  Yoga    Effect of Pain on Daily Activities  Patient feels limited in how long she can work on Animator.         [facility] PT Assessment -  11/12/19 0001      Assessment   Medical Diagnosis  Muscle pain in neck and shoulders    Referring Provider (PT)  Leighton Ruff, MD    Onset Date/Surgical Date  --   patient reports years of chronic pain   Hand Dominance  Right    Prior Therapy  Yes - for hip and LBP      Precautions   Precautions  None      Restrictions   Weight Bearing Restrictions  No      Balance Screen   Has the patient fallen in the past 6 months  No    Has the patient had a decrease in activity level because of a fear of falling?   No    Is the patient reluctant to leave their home because of a fear of falling?   No      Home Film/video editor residence    Living Arrangements  Spouse/significant other;Children      Prior Function   Level of Independence  Independent    Vocation  Full time employment    Licensed conveyancer      Cognition   Overall Cognitive Status  Within  Functional Limits for tasks assessed      Observation/Other Assessments   Observations  Patient appears in no apparent distress    Focus on Therapeutic Outcomes (FOTO)   39% limitation   predicted 29% limitation     Sensation   Light Touch  Appears Intact      Posture/Postural Control   Posture Comments  Patient exhibits rounded shoulder and forward head posture, relatively flat thoracic spine      ROM / Strength   AROM / PROM / Strength  AROM;Strength      AROM   Overall AROM Comments  Shoulder AROM grossly WFL and non-painful    AROM Assessment Site  Cervical;Shoulder    Cervical Flexion  45   patient reports posterior neck/shoulder tightness   Cervical Extension  45    Cervical - Right Side Bend  30   tightness opposite side   Cervical - Left Side Bend  30   tightness opposite side   Cervical - Right Rotation  65    Cervical - Left Rotation  65      Strength   Overall Strength Comments  Periscapular strength grossly 4-/5 MMT bilaterally    Strength Assessment Site  Shoulder    Right/Left Shoulder  Right;Left    Right Shoulder Flexion  5/5    Right Shoulder Extension  4+/5    Right Shoulder ABduction  5/5    Right Shoulder Internal Rotation  5/5    Right Shoulder External Rotation  5/5    Right Shoulder Horizontal ABduction  4/5    Left Shoulder Flexion  5/5    Left Shoulder Extension  4+/5    Left Shoulder ABduction  5/5    Left Shoulder Internal Rotation  5/5    Left Shoulder External Rotation  5/5    Left Shoulder Horizontal ABduction  4/5      Flexibility   Soft Tissue Assessment /Muscle Length  yes   Upper trap and levator scap tightness     Palpation   Palpation comment  TTP bilat cervical paraspinals and upper trap/levator scap region      Special Tests   Other special tests  DNF endurance deficit noted, increased difficulty maintaining chin tuck  Transfers   Transfers  Independent with all Transfers                Objective  measurements completed on examination: See above findings.      [facility] Adult PT Treatment/Exercise - 11/12/19 0001      Exercises   Exercises  Neck      Neck Exercises: Theraband   Rows  10 reps;Red      Neck Exercises: Seated   Neck Retraction  10 reps;3 secs      Neck Exercises: Prone   Other Prone Exercise  Prone I and T with 3 sec hold x 10      Neck Exercises: Stretches   Upper Trapezius Stretch  2 reps;20 seconds    Levator Stretch  2 reps;20 seconds    Chest Stretch  2 reps;20 seconds    Chest Stretch Limitations  doorway             PT Education - 11/12/19 1443    Education Details  Exam findings, POC, HEP    Person(s) Educated  Patient    Methods  Explanation;Demonstration;Tactile cues;Verbal cues;Handout    Comprehension  Verbalized understanding;Returned demonstration;Verbal cues required;Tactile cues required;Need further instruction       PT Short Term Goals - 11/12/19 1550      PT SHORT TERM GOAL #1   Title  Patient will be I with initial HEP to progress in PT    Time  6    Period  Weeks    Status  New    Target Date  12/24/19      PT SHORT TERM GOAL #2   Title  Patient will report no tightness or pain with cervical AROM    Time  6    Period  Weeks    Status  New    Target Date  12/24/19      PT SHORT TERM GOAL #3   Title  Patient will exhibit improved periscapular strength of >/= 4+/5 MMT so she can work at a computer for > 1 hour without increase in pain level or tension    Time  6    Period  Weeks    Status  New    Target Date  12/24/19      PT SHORT TERM GOAL #4   Title  Patient will report improved functional level of </= 29% limitation on FOTO    Time  6    Period  Weeks    Status  New    Target Date  12/24/19        PT Long Term Goals - 11/12/19 1552      PT LONG TERM GOAL #1   Title  STG=LTG             Plan - 11/12/19 1456    Clinical Impression Statement  Patient presents to PT with report of chronic neck,  upper trap, shoulder pain and tension when she has been sitting or working at a computer for extended periods of time. Her symptoms seem musculoskeletal in nature. She exhibit postural deficits, tight upper trap and levator musculature, impaired strength and endurance of periscapular musculature and DNF. She would benefit from continued skilled PT to progress postural control to reduce pain and tension when she is working on the computer for extended periods of time.    Personal Factors and Comorbidities  Time since onset of injury/illness/exacerbation;Past/Current Experience    Examination-Activity Limitations  Sit    Examination-Participation  Restrictions  Other   Working at Occupational hygienist  Stable/Uncomplicated    Clinical Decision Making  Low    Rehab Potential  Good    PT Frequency  1x / week    PT Duration  6 weeks   1st follow-up scheduled in 2 weeks   PT Treatment/Interventions  ADLs/Self Care Home Management;Cryotherapy;Electrical Stimulation;Moist Heat;Neuromuscular re-education;Therapeutic activities;Therapeutic exercise;Patient/family education;Manual techniques;Dry needling;Passive range of motion;Taping;Spinal Manipulations;Joint Manipulations    PT Next Visit Plan  Assess HEP and progress PRN, manual and/or dry needling PRN, progress postural control and periscapular strengthening    PT Home Exercise Plan  Chin tuck, row with green, prone I and T, upper trap and levator stretch, door chest stretch    Consulted and Agree with Plan of Care  Patient       Patient will benefit from skilled therapeutic intervention in order to improve the following deficits and impairments:  Postural dysfunction, Decreased strength, Decreased range of motion, Impaired flexibility, Pain, Decreased activity tolerance  Visit Diagnosis: Cervicalgia  Chronic left shoulder pain  Chronic right shoulder pain  Abnormal posture  Muscle weakness (generalized)     Problem  List Patient Active Problem List   Diagnosis Date Noted  . Anxiety and depression 05/31/2018  . Rh negative, maternal 05/31/2018  . Frank breech presentation 05/29/2018  . Status post repeat low transverse cesarean section 05/29/2018  . Postpartum care following cesarean delivery (10/24) 05/29/2018    Rosana Hoes, PT, DPT, LAT, ATC 11/12/19  4:26 PM Phone: [phone] Fax: [phone]   [facility] Outpatient Rehabilitation [facility] [address] [place], [place], [address] Phone: [phone]   Fax:  [phone]  Name: Adrienne Williams MRN: [id number] Date of Birth: 1980-11-20

## 2019-12-03 ENCOUNTER — Other Ambulatory Visit: Payer: Self-pay

## 2019-12-03 ENCOUNTER — Encounter: Payer: Self-pay | Admitting: Physical Therapy

## 2019-12-03 ENCOUNTER — Ambulatory Visit: Payer: Managed Care, Other (non HMO) | Admitting: Physical Therapy

## 2019-12-03 DIAGNOSIS — M542 Cervicalgia: Secondary | ICD-10-CM

## 2019-12-03 DIAGNOSIS — M25511 Pain in right shoulder: Secondary | ICD-10-CM

## 2019-12-03 DIAGNOSIS — R293 Abnormal posture: Secondary | ICD-10-CM

## 2019-12-03 DIAGNOSIS — M6281 Muscle weakness (generalized): Secondary | ICD-10-CM

## 2019-12-03 DIAGNOSIS — G8929 Other chronic pain: Secondary | ICD-10-CM

## 2019-12-03 DIAGNOSIS — M25512 Pain in left shoulder: Secondary | ICD-10-CM

## 2019-12-03 NOTE — Therapy (Signed)
[facility] Outpatient Rehabilitation [facility] [address] [place], [place], [address] Phone: [phone]   Fax:  [phone]  Physical Therapy Treatment  Patient Details  Name: Adrienne Williams MRN: [id number] Date of Birth: 1980-11-01 Referring Provider (PT): Juluis Rainier, MD   Encounter Date: 12/03/2019  PT End of Session - 12/03/19 1406    Visit Number  2    Number of Visits  4    Date for PT Re-Evaluation  12/24/19    Authorization Type  [employer]    PT Start Time  1400    PT Stop Time  1440    PT Time Calculation (min)  40 min    Activity Tolerance  Patient tolerated treatment well    Behavior During Therapy  [facility] for tasks assessed/performed       Past Medical History:  Diagnosis Date  . Anxiety   . Asthma   . Depression   . Frank breech presentation 05/29/2018  . Hemorrhoids   . Hx of varicella   . PONV (postoperative nausea and vomiting)    "little nauseated"  . Postpartum care following cesarean delivery (6/14) 01/18/2016    Past Surgical History:  Procedure Laterality Date  . CESAREAN SECTION N/A 01/18/2016   Procedure: Primary CESAREAN SECTION;  Surgeon: Genia Del, MD;  Location: [facility];  Service: Obstetrics;  Laterality: N/A;  EDD: 01/25/16  . CESAREAN SECTION N/A 05/29/2018   Procedure: Repeat CESAREAN SECTION;  Surgeon: Shea Evans, MD;  Location: [facility] BIRTHING SUITES;  Service: Obstetrics;  Laterality: N/A;  EDD: 06/10/18  . CYSTECTOMY  2002   pilonidal region  . MOLE REMOVAL    . WISDOM TOOTH EXTRACTION      There were no vitals filed for this visit.  Subjective Assessment - 12/03/19 1403    Subjective  Patient reports she has been feeling better. She has been consistent with her exercises. Today she reports tightness greater on the right side of the neck that seems to be behind her ear.    Patient Stated Goals  Patient wants to feel better and have tools to relieve pain    Currently in Pain?  Yes    Pain  Score  5     Pain Location  Neck    Pain Orientation  Right;Left    Pain Descriptors / Indicators  Tightness   Tension   Pain Type  Chronic pain    Pain Radiating Towards  Bilateral neck, upper trap region, upper back    Pain Onset  More than a month ago    Pain Frequency  Intermittent    Aggravating Factors   Working on computer for extended periods of time         [facility] PT Assessment - 12/03/19 0001      Assessment   Medical Diagnosis  Muscle pain in neck and shoulders    Referring Provider (PT)  Juluis Rainier, MD      Flexibility   Soft Tissue Assessment /Muscle Length  yes   Continued upper trap and levator scap tightness (R > L)                  [facility] Adult PT Treatment/Exercise - 12/03/19 0001      Exercises   Exercises  Neck      Neck Exercises: Theraband   Shoulder Extension  10 reps;Green    Shoulder Extension Limitations  with scap retraction    Rows  10 reps;Blue   2 sets  Shoulder External Rotation  10 reps;Green    Shoulder External Rotation Limitations  double ER with scap retraction      Manual Therapy   Manual Therapy  Manual Traction;Passive ROM    Passive ROM  Supine upper trap and levator scap stretch    Manual Traction  Gentle manual cervical traction combined with suboccipital release      Neck Exercises: Stretches   Upper Trapezius Stretch  20 seconds    Levator Stretch  20 seconds    Chest Stretch  60 seconds    Chest Stretch Limitations  supine on foam roller with arms >90 deg abd    Other Neck Stretches  Supine self suboocipital release with peanut and chin tucks             PT Education - 12/03/19 1405    Education Details  HEP    Person(s) Educated  Patient    Methods  Explanation;Demonstration;Tactile cues;Verbal cues    Comprehension  Verbalized understanding;Returned demonstration;Verbal cues required;Need further instruction;Tactile cues required       PT Short Term Goals - 11/12/19 1550      PT SHORT  TERM GOAL #1   Title  Patient will be I with initial HEP to progress in PT    Time  6    Period  Weeks    Status  New    Target Date  12/24/19      PT SHORT TERM GOAL #2   Title  Patient will report no tightness or pain with cervical AROM    Time  6    Period  Weeks    Status  New    Target Date  12/24/19      PT SHORT TERM GOAL #3   Title  Patient will exhibit improved periscapular strength of >/= 4+/5 MMT so she can work at a computer for > 1 hour without increase in pain level or tension    Time  6    Period  Weeks    Status  New    Target Date  12/24/19      PT SHORT TERM GOAL #4   Title  Patient will report improved functional level of </= 29% limitation on FOTO    Time  6    Period  Weeks    Status  New    Target Date  12/24/19        PT Long Term Goals - 11/12/19 1552      PT LONG TERM GOAL #1   Title  STG=LTG            Plan - 12/03/19 1406    Clinical Impression Statement  Patient tolerated therapy well with no adverse effects. She reported improved in neck/shoulder tightness following manual therapy and responded well to use of peanut (2 tennis balls) for supine self suboccipital release. She reported that she would prefer not to perform exercises on her stomach so she was provided with new standing banded exercises to progress postural strengthening with good tolerance. She also stated that the doorway pec stretch was not effective so modified to performing supine on foam roller with better benefit. She did not report any increase in pain this visit. She would benefit from continued skilled PT to progress postural control to reduce pain and tension when she is working on the computer for extended periods of time.    PT Treatment/Interventions  ADLs/Self Care Home Management;Cryotherapy;Electrical Stimulation;Moist Heat;Neuromuscular re-education;Therapeutic activities;Therapeutic exercise;Patient/family education;Manual techniques;Dry needling;Passive range  of  motion;Taping;Spinal Manipulations;Joint Manipulations    PT Next Visit Plan  Assess HEP and progress PRN, manual and/or dry needling PRN for suboccipitals/upper trap/levator scap, progress postural control and periscapular strengthening    PT Home Exercise Plan  [email or web address]: Chin tuck, row with blue, extension with scap retraction with green, double ER with scap retraction with green, upper trap and levator stretch, pec stretch supine on foam roller, suboccipital release with peanut    Consulted and Agree with Plan of Care  Patient       Patient will benefit from skilled therapeutic intervention in order to improve the following deficits and impairments:  Postural dysfunction, Decreased strength, Decreased range of motion, Impaired flexibility, Pain, Decreased activity tolerance  Visit Diagnosis: Cervicalgia  Chronic left shoulder pain  Chronic right shoulder pain  Abnormal posture  Muscle weakness (generalized)     Problem List Patient Active Problem List   Diagnosis Date Noted  . Anxiety and depression 05/31/2018  . Rh negative, maternal 05/31/2018  . Frank breech presentation 05/29/2018  . Status post repeat low transverse cesarean section 05/29/2018  . Postpartum care following cesarean delivery (10/24) 05/29/2018    Hilda Blades, PT, DPT, LAT, ATC 12/03/19  3:55 PM Phone: [phone] Fax: [facility] [address] [place], [place], [address] Phone: [phone]   Fax:  [phone]  Name: Adrienne Williams MRN: [id number] Date of Birth: 27-Jan-1981

## 2019-12-10 ENCOUNTER — Encounter: Payer: Self-pay | Admitting: Physical Therapy

## 2019-12-10 ENCOUNTER — Ambulatory Visit: Payer: Managed Care, Other (non HMO) | Attending: Family Medicine | Admitting: Physical Therapy

## 2019-12-10 ENCOUNTER — Other Ambulatory Visit: Payer: Self-pay

## 2019-12-10 DIAGNOSIS — M6281 Muscle weakness (generalized): Secondary | ICD-10-CM | POA: Insufficient documentation

## 2019-12-10 DIAGNOSIS — M542 Cervicalgia: Secondary | ICD-10-CM | POA: Diagnosis not present

## 2019-12-10 DIAGNOSIS — M25512 Pain in left shoulder: Secondary | ICD-10-CM | POA: Diagnosis present

## 2019-12-10 DIAGNOSIS — R293 Abnormal posture: Secondary | ICD-10-CM | POA: Diagnosis present

## 2019-12-10 DIAGNOSIS — M25511 Pain in right shoulder: Secondary | ICD-10-CM | POA: Diagnosis present

## 2019-12-10 DIAGNOSIS — G8929 Other chronic pain: Secondary | ICD-10-CM | POA: Diagnosis present

## 2019-12-10 NOTE — Therapy (Signed)
[facility] Outpatient Rehabilitation [facility] [address] [place], [place], [address] Phone: [phone]   Fax:  [phone]  Physical Therapy Treatment  Patient Details  Name: Adrienne Williams MRN: [id number] Date of Birth: 1981/03/12 Referring Provider (PT): Juluis Rainier, MD   Encounter Date: 12/10/2019  PT End of Session - 12/10/19 1059    Visit Number  3    Number of Visits  4    Date for PT Re-Evaluation  12/24/19    Authorization Type  [employer]    PT Start Time  1055    PT Stop Time  1145    PT Time Calculation (min)  50 min    Activity Tolerance  Patient tolerated treatment well    Behavior During Therapy  [facility] for tasks assessed/performed       Past Medical History:  Diagnosis Date  . Anxiety   . Asthma   . Depression   . Frank breech presentation 05/29/2018  . Hemorrhoids   . Hx of varicella   . PONV (postoperative nausea and vomiting)    "little nauseated"  . Postpartum care following cesarean delivery (6/14) 01/18/2016    Past Surgical History:  Procedure Laterality Date  . CESAREAN SECTION N/A 01/18/2016   Procedure: Primary CESAREAN SECTION;  Surgeon: Genia Del, MD;  Location: [facility];  Service: Obstetrics;  Laterality: N/A;  EDD: 01/25/16  . CESAREAN SECTION N/A 05/29/2018   Procedure: Repeat CESAREAN SECTION;  Surgeon: Shea Evans, MD;  Location: [facility] BIRTHING SUITES;  Service: Obstetrics;  Laterality: N/A;  EDD: 06/10/18  . CYSTECTOMY  2002   pilonidal region  . MOLE REMOVAL    . WISDOM TOOTH EXTRACTION      There were no vitals filed for this visit.  Subjective Assessment - 12/10/19 1059    Subjective  Pt. 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 If bra trap is too loose maintaining good posture becomes more difficult.    Pertinent History  Chronic pain    Currently in Pain?  Yes    Pain Score  4     Pain Location  Neck    Pain  Orientation  Right    Pain Descriptors / Indicators  Tightness    Pain Type  Chronic pain    Pain Radiating Towards  right masseter region    Pain Onset  More than a month ago    Pain Frequency  Intermittent    Aggravating Factors   work activities/computer    Pain Relieving Factors  yoga    Effect of Pain on Daily Activities  limits tolerance work activities                       [facility] Adult PT Treatment/Exercise - 12/10/19 0001      Neck Exercises: Theraband   Rows Limitations  see under standing exercises    Horizontal ABduction  20 reps;Green    Horizontal ABduction Limitations  supine      Neck Exercises: Standing   Other Standing Exercises  TRX row 2x10      Manual Therapy   Manual Therapy  Soft tissue mobilization    Soft tissue mobilization  bilateral upper trapezius and levator region    Manual Traction  gentle manual traction and suboccipital release      Neck Exercises: Stretches   Upper Trapezius Stretch  Right;Left;3 reps;20 seconds    Levator Stretch  Right;Left;3 reps;20 seconds  Trigger Point Dry Needling - 12/10/19 0001    Consent Given?  Yes    Education Handout Provided  Yes    Muscles Treated Head and Neck  Upper trapezius;Levator scapulae;Masseter   masseter on right only otherwise needling bilat.   Dry Needling Comments  needling to right masseter left in situ during manual traction, needling to upper traps and levator in prone bilat., 30 gauge 30 mm needles used    Electrical Stimulation Performed with Dry Needling  Yes    E-stim with Dry Needling Details  TENS 20 pps x 10 minutes to bilat. upper trapezius and levator region           PT Education - 12/10/19 1141    Education Details  dry needling, exercises    Person(s) Educated  Patient    Methods  Explanation    Comprehension  Verbalized understanding       PT Short Term Goals - 11/12/19 1550      PT SHORT TERM GOAL #1   Title  Patient will be I with initial HEP  to progress in PT    Time  6    Period  Weeks    Status  New    Target Date  12/24/19      PT SHORT TERM GOAL #2   Title  Patient will report no tightness or pain with cervical AROM    Time  6    Period  Weeks    Status  New    Target Date  12/24/19      PT SHORT TERM GOAL #3   Title  Patient will exhibit improved periscapular strength of >/= 4+/5 MMT so she can work at a computer for > 1 hour without increase in pain level or tension    Time  6    Period  Weeks    Status  New    Target Date  12/24/19      PT SHORT TERM GOAL #4   Title  Patient will report improved functional level of </= 29% limitation on FOTO    Time  6    Period  Weeks    Status  New    Target Date  12/24/19        PT Long Term Goals - 11/12/19 1552      PT LONG TERM GOAL #1   Title  STG=LTG            Plan - 12/10/19 1142    Clinical Impression Statement  Trial dry needling today with tx. as noted per flowsheet with good tolerance. Otherwise continued stretches and postural strengthening with use TRX for rows for added strengthening (pt. shown as option for potential home gym equipment). Still with limitations but pt. improving from baseline with positional tolerance and improved "recovery" from symptom exacerbations.    Personal Factors and Comorbidities  Time since onset of injury/illness/exacerbation;Past/Current Experience    Examination-Activity Limitations  Sit    Examination-Participation Restrictions  Other    Stability/Clinical Decision Making  Stable/Uncomplicated    Clinical Decision Making  Low    Rehab Potential  Good    PT Frequency  1x / week    PT Duration  6 weeks    PT Treatment/Interventions  ADLs/Self Care Home Management;Cryotherapy;Electrical Stimulation;Moist Heat;Neuromuscular re-education;Therapeutic activities;Therapeutic exercise;Patient/family education;Manual techniques;Dry needling;Passive range of motion;Taping;Spinal Manipulations;Joint Manipulations    PT Next  Visit Plan  Assess HEP and progress PRN, manual and/or dry needling PRN for suboccipitals/upper trap/levator scap,  progress postural control and periscapular strengthening    PT Home Exercise Plan  [email or web address]: Chin tuck, row with blue, extension with scap retraction with green, double ER with scap retraction with green, upper trap and levator stretch, pec stretch supine on foam roller, suboccipital release with peanut    Consulted and Agree with Plan of Care  Patient       Patient will benefit from skilled therapeutic intervention in order to improve the following deficits and impairments:  Postural dysfunction, Decreased strength, Decreased range of motion, Impaired flexibility, Pain, Decreased activity tolerance  Visit Diagnosis: Cervicalgia  Chronic left shoulder pain  Chronic right shoulder pain  Abnormal posture  Muscle weakness (generalized)     Problem List Patient Active Problem List   Diagnosis Date Noted  . Anxiety and depression 05/31/2018  . Rh negative, maternal 05/31/2018  . Frank breech presentation 05/29/2018  . Status post repeat low transverse cesarean section 05/29/2018  . Postpartum care following cesarean delivery (10/24) 05/29/2018   Lazarus Gowda, PT, DPT 12/10/19 11:45 AM  [facility] Outpatient Rehabilitation [facility] [address] [place], [place], [address] Phone: [phone]   Fax:  [phone]  Name: Adrienne Williams MRN: [id number] Date of Birth: Sep 17, 1980

## 2019-12-10 NOTE — Patient Instructions (Signed)

## 2019-12-17 ENCOUNTER — Ambulatory Visit: Payer: Managed Care, Other (non HMO) | Admitting: Physical Therapy

## 2019-12-17 ENCOUNTER — Other Ambulatory Visit: Payer: Self-pay

## 2019-12-17 DIAGNOSIS — M542 Cervicalgia: Secondary | ICD-10-CM | POA: Diagnosis not present

## 2019-12-17 DIAGNOSIS — R293 Abnormal posture: Secondary | ICD-10-CM

## 2019-12-17 DIAGNOSIS — M6281 Muscle weakness (generalized): Secondary | ICD-10-CM

## 2019-12-17 DIAGNOSIS — M25512 Pain in left shoulder: Secondary | ICD-10-CM

## 2019-12-17 DIAGNOSIS — G8929 Other chronic pain: Secondary | ICD-10-CM

## 2019-12-17 NOTE — Therapy (Signed)
[facility] Outpatient Rehabilitation [facility] [address] [place], [place], [address] Phone: [phone]   Fax:  [phone]  Physical Therapy Treatment  Patient Details  Name: Adrienne Williams MRN: [id number] Date of Birth: [birth year] Referring Provider (PT): Juluis Rainier, MD   Encounter Date: 12/17/2019  PT End of Session - 12/17/19 1149    Visit Number  4    Number of Visits  4    Date for PT Re-Evaluation  12/24/19    Authorization Type  [employer]    PT Start Time  1058    PT Stop Time  1154   time spent for dry needling and estim not included in direct time minutes   PT Time Calculation (min)  56 min    Activity Tolerance  Patient tolerated treatment well    Behavior During Therapy  [facility] for tasks assessed/performed       Past Medical History:  Diagnosis Date  . Anxiety   . Asthma   . Depression   . Frank breech presentation 05/29/2018  . Hemorrhoids   . Hx of varicella   . PONV (postoperative nausea and vomiting)    "little nauseated"  . Postpartum care following cesarean delivery (6/14) 01/18/2016    Past Surgical History:  Procedure Laterality Date  . CESAREAN SECTION N/A 01/18/2016   Procedure: Primary CESAREAN SECTION;  Surgeon: Genia Del, MD;  Location: [facility];  Service: Obstetrics;  Laterality: N/A;  EDD: 01/25/16  . CESAREAN SECTION N/A 05/29/2018   Procedure: Repeat CESAREAN SECTION;  Surgeon: Shea Evans, MD;  Location: [facility] BIRTHING SUITES;  Service: Obstetrics;  Laterality: N/A;  EDD: 06/10/18  . CYSTECTOMY  2002   pilonidal region  . MOLE REMOVAL    . WISDOM TOOTH EXTRACTION      There were no vitals filed for this visit.  Subjective Assessment - 12/17/19 1150    Subjective  Still with some tendency symptom exacerbation with posture at work but reports symptoms more manageable. Pain today mostly on right side in upper trapezius region and also noting some suboccipital region tightness.    Pertinent History   Chronic pain    Patient Stated Goals  Patient wants to feel better and have tools to relieve pain    Currently in Pain?  Yes    Pain Score  --   3-4/10   Pain Location  Neck    Pain Orientation  Right    Pain Descriptors / Indicators  Tightness    Pain Type  Chronic pain    Pain Onset  More than a month ago    Pain Frequency  Intermittent    Aggravating Factors   work activities, computer work    Pain Relieving Factors  yoga, stretches    Effect of Pain on Daily Activities  limits positional tolerance for work activities         [facility] PT Assessment - 12/17/19 0001      AROM   Cervical - Right Rotation  75    Cervical - Left Rotation  65                    [facility] Adult PT Treatment/Exercise - 12/17/19 0001      Manual Therapy   Soft tissue mobilization  bilateral upper trapezius and levator region    Manual Traction  gentle manual traction and suboccipital release      Neck Exercises: Stretches   Upper Trapezius Stretch  Right;Left;30 seconds   3x  on right side, 2x on left side   Levator Stretch  Right;Left;30 seconds   3x on right side, 2x on left side   Chest Stretch  3 reps;30 seconds    Chest Stretch Limitations  supine manual pec minor stretch 3x30 sec    Other Neck Stretches  HEP instruction pec stretch variation with arms reaching behind back       Trigger Point Dry Needling - 12/17/19 0001    Consent Given?  Yes    Muscles Treated Head and Neck  Upper trapezius;Oblique capitus;Levator scapulae    Dry Needling Comments  needling to bilat sides with 30 gauge 30 mm needles    Electrical Stimulation Performed with Dry Needling  Yes    E-stim with Dry Needling Details  TENS 20 pps x 10 minutes to bilat. upper trapezius and levator region           PT Education - 12/17/19 1149    Education Details  HEP, posture    Person(s) Educated  Patient    Methods  Explanation;Demonstration;Verbal cues    Comprehension  Verbalized understanding        PT Short Term Goals - 11/12/19 1550      PT SHORT TERM GOAL #1   Title  Patient will be I with initial HEP to progress in PT    Time  6    Period  Weeks    Status  New    Target Date  12/24/19      PT SHORT TERM GOAL #2   Title  Patient will report no tightness or pain with cervical AROM    Time  6    Period  Weeks    Status  New    Target Date  12/24/19      PT SHORT TERM GOAL #3   Title  Patient will exhibit improved periscapular strength of >/= 4+/5 MMT so she can work at a computer for > 1 hour without increase in pain level or tension    Time  6    Period  Weeks    Status  New    Target Date  12/24/19      PT SHORT TERM GOAL #4   Title  Patient will report improved functional level of </= 29% limitation on FOTO    Time  6    Period  Weeks    Status  New    Target Date  12/24/19        PT Long Term Goals - 11/12/19 1552      PT LONG TERM GOAL #1   Title  STG=LTG            Plan - 12/17/19 1152    Clinical Impression Statement  Still with muscle tightness/trigger points right>left upper trapezius region but overall responding well to tx. with dry needling, manual, stretches and postural strengthening. Suspect symptoms continue to be exacerbated with posture at work so continued review strategies to help address/improve posture with computer work.    Personal Factors and Comorbidities  Time since onset of injury/illness/exacerbation;Past/Current Experience    Examination-Activity Limitations  Sit    Examination-Participation Restrictions  Other    Stability/Clinical Decision Making  Stable/Uncomplicated    Clinical Decision Making  Low    Rehab Potential  Good    PT Frequency  1x / week    PT Duration  6 weeks    PT Treatment/Interventions  ADLs/Self Care Home Management;Cryotherapy;Electrical Stimulation;Moist Heat;Neuromuscular re-education;Therapeutic activities;Therapeutic exercise;Patient/family education;Manual  techniques;Dry needling;Passive range  of motion;Taping;Spinal Manipulations;Joint Manipulations    PT Next Visit Plan  Assess HEP and progress PRN, manual and/or dry needling PRN for suboccipitals/upper trap/levator scap, progress postural control and periscapular strengthening    PT Home Exercise Plan  [email or web address]: Chin tuck, row with blue, extension with scap retraction with green, double ER with scap retraction with green, upper trap and levator stretch, pec stretch supine on foam roller, suboccipital release with peanut    Consulted and Agree with Plan of Care  Patient       Patient will benefit from skilled therapeutic intervention in order to improve the following deficits and impairments:  Postural dysfunction, Decreased strength, Decreased range of motion, Impaired flexibility, Pain, Decreased activity tolerance  Visit Diagnosis: Cervicalgia  Chronic left shoulder pain  Chronic right shoulder pain  Abnormal posture  Muscle weakness (generalized)     Problem List Patient Active Problem List   Diagnosis Date Noted  . Anxiety and depression 05/31/2018  . Rh negative, maternal 05/31/2018  . Frank breech presentation 05/29/2018  . Status post repeat low transverse cesarean section 05/29/2018  . Postpartum care following cesarean delivery (10/24) 05/29/2018    Beaulah Dinning, PT, DPT 12/17/19 11:57 AM  [facility] [place], [place], [address] Phone: [phone]   Fax:  [phone]  Name: Adrienne Williams MRN: [id number] Date of Birth: February 02, 1981

## 2019-12-24 ENCOUNTER — Ambulatory Visit: Payer: Managed Care, Other (non HMO) | Admitting: Physical Therapy

## 2020-01-07 ENCOUNTER — Encounter: Payer: Self-pay | Admitting: Physical Therapy

## 2020-01-07 ENCOUNTER — Other Ambulatory Visit: Payer: Self-pay

## 2020-01-07 ENCOUNTER — Ambulatory Visit: Payer: Managed Care, Other (non HMO) | Attending: Family Medicine | Admitting: Physical Therapy

## 2020-01-07 DIAGNOSIS — M25512 Pain in left shoulder: Secondary | ICD-10-CM | POA: Diagnosis present

## 2020-01-07 DIAGNOSIS — R293 Abnormal posture: Secondary | ICD-10-CM | POA: Diagnosis present

## 2020-01-07 DIAGNOSIS — M542 Cervicalgia: Secondary | ICD-10-CM | POA: Insufficient documentation

## 2020-01-07 DIAGNOSIS — G8929 Other chronic pain: Secondary | ICD-10-CM | POA: Insufficient documentation

## 2020-01-07 DIAGNOSIS — M25511 Pain in right shoulder: Secondary | ICD-10-CM | POA: Insufficient documentation

## 2020-01-07 DIAGNOSIS — M6281 Muscle weakness (generalized): Secondary | ICD-10-CM | POA: Insufficient documentation

## 2020-01-07 NOTE — Patient Instructions (Signed)
Access Code: [email or web address] URL: [email or web address] Date: 12/03/2019 Prepared by: Rosana Hoes  Exercises Seated Cervical Retraction - 2 x daily - 7 x weekly - 10 reps - 3 seconds hold Standing Bilateral Low Shoulder Row with Anchored Resistance - 1 x daily - 7 x weekly - 2 sets - 15-20 reps Scapular Retraction with Resistance Advanced - 1 x daily - 7 x weekly - 2 sets - 15-20 reps Shoulder External Rotation and Scapular Retraction with Resistance - 1 x daily - 7 x weekly - 2 sets - 15-20 reps Standing Shoulder Horizontal Abduction with Resistance - 1 x daily - 7 x weekly - 2 sets - 15-20 reps Gentle Upper Trap Stretch - 2 x daily - 7 x weekly - 2 reps - 15 seconds hold Gentle Levator Scapulae Stretch - 2 x daily - 7 x weekly - 2 reps - 15 seconds hold Supine Chest Stretch on Foam Roll - 2 x daily - 7 x weekly - 30-60 seconds hold Supine Suboccipital Release with Tennis Balls - 2 x daily - 7 x weekly - 10 reps Step Back Shoulder Stretch with Chair - 2 x daily - 7 x weekly - 5 reps - 5-10 seconds hold

## 2020-01-07 NOTE — Therapy (Signed)
[facility] [address] [place], [place], [address] Phone: [phone]   Fax:  [phone]  Physical Therapy Treatment    Progress Note Reporting Period 11/12/2019 to 01/07/2020  See note below for Objective Data and Assessment of Progress/Goals.    Patient Details  Name: Adrienne Williams MRN: [id number] Date of Birth: [birth year] Referring Provider (PT): Juluis Rainier, MD   Encounter Date: 01/07/2020  PT End of Session - 01/07/20 1212    Visit Number  5    Number of Visits  11    Date for PT Re-Evaluation  02/18/20    Authorization Type  [employer]    PT Start Time  1215    PT Stop Time  1255    PT Time Calculation (min)  40 min    Activity Tolerance  Patient tolerated treatment well    Behavior During Therapy  [facility] for tasks assessed/performed       Past Medical History:  Diagnosis Date   Anxiety    Asthma    Depression    Frank breech presentation 05/29/2018   Hemorrhoids    Hx of varicella    PONV (postoperative nausea and vomiting)    "little nauseated"   Postpartum care following cesarean delivery (6/14) 01/18/2016    Past Surgical History:  Procedure Laterality Date   CESAREAN SECTION N/A 01/18/2016   Procedure: Primary CESAREAN SECTION;  Surgeon: Genia Del, MD;  Location: [facility];  Service: Obstetrics;  Laterality: N/A;  EDD: 01/25/16   CESAREAN SECTION N/A 05/29/2018   Procedure: Repeat CESAREAN SECTION;  Surgeon: Shea Evans, MD;  Location: [facility]IRTHING SUIT[facility];  Service: Obstetrics;  Laterality: N/A;  EDD: 06/10/18   CYSTECTOMY  2002   pilonidal region   MOLE REMOVAL     WISDOM TOOTH EXTRACTION      There were no vitals filed for this visit.  Subjective Assessment - 01/07/20 1212    Subjective  Patient reports shoulders are feeling better but would like some contiued improvement with her neck. She feels she is not getting as back as she was and she feels like her  exercises are definitely helping.    Patient Stated Goals  Patient wants to feel better and have tools to relieve pain    Currently in Pain?  Yes    Pain Score  3     Pain Location  Neck    Pain Descriptors / Indicators  Tightness    Pain Type  Chronic pain    Pain Radiating Towards  bilateral upper trap region    Pain Onset  More than a month ago    Pain Frequency  Intermittent         [facility] PT Assessment - 01/07/20 0001      Assessment   Medical Diagnosis  Muscle pain in neck and shoulders    Referring Provider (PT)  Juluis Rainier, MD      Precautions   Precautions  None      Restrictions   Weight Bearing Restrictions  No      Balance Screen   Has the patient fallen in the past 6 months  No      Prior Function   Level of Independence  Independent      Observation/Other Assessments   Observations  Patient appears in no apparent distress      Posture/Postural Control   Posture Comments  Patient exhibits mild rounded shoulder and forward head posture  AROM   Overall AROM Comments  Patient reports right sided upper trap and neck tightness    Cervical - Right Side Bend  35    Cervical - Left Side Bend  40    Cervical - Right Rotation  75    Cervical - Left Rotation  70      Strength   Overall Strength Comments  Periscapular strength grossly 4/5 MMT bilaterally                    [facility] Adult PT Treatment/Exercise - 01/07/20 0001      Exercises   Exercises  Neck      Neck Exercises: Machines for Strengthening   UBE (Upper Arm Bike)  L3 x 4 min (fwd/bwd)      Neck Exercises: Theraband   Shoulder Extension  15 reps;Blue   2 sets   Rows  20 reps   2 sets with black   Horizontal ABduction  15 reps;Red   2 sets     Neck Exercises: Stretches   Upper Trapezius Stretch  2 reps;30 seconds    Levator Stretch  2 reps;30 seconds    Other Neck Stretches  Thoracic extension step-back stretch at counter x5 10 sec hold    Other Neck Stretches  Self  TPR using thera cane to right upper trap, scalenes, cervical paraspinals             PT Education - 01/07/20 1212    Education Details  HEP progression    Person(s) Educated  Patient    Methods  Explanation;Demonstration;Verbal cues    Comprehension  Verbalized understanding;Returned demonstration;Verbal cues required;Need further instruction       PT Short Term Goals - 01/07/20 1259      PT SHORT TERM GOAL #1   Title  Patient will be I with initial HEP to progress in PT    Time  6    Period  Weeks    Status  On-going    Target Date  02/18/20      PT SHORT TERM GOAL #2   Title  Patient will report no tightness or pain with cervical AROM    Time  6    Period  Weeks    Status  On-going    Target Date  02/18/20      PT SHORT TERM GOAL #3   Title  Patient will exhibit improved periscapular strength of >/= 4+/5 MMT so she can work at a computer for > 1 hour without increase in pain level or tension    Time  6    Period  Weeks    Status  On-going    Target Date  02/18/20      PT SHORT TERM GOAL #4   Title  Patient will report improved functional level of </= 29% limitation on FOTO    Time  6    Period  Weeks    Status  On-going    Target Date  02/18/20        PT Long Term Goals - 11/12/19 1552      PT LONG TERM GOAL #1   Title  STG=LTG            Plan - 01/07/20 1213    Clinical Impression Statement  Patient tolerated therapy well with no adverse effects. Patient is progressing well with stretching and strengthening to improve postural control and tolerance for working at a computer for extended periods. She does continue  to report mainly neck tightness/discomfort at work and she does exhibit continue periscapular strength and endurance deficit. Her HEP was progressed this visit with good tolerance and she would benefit from continued skilled PT to progress postural control to reduce pain and tension when she is working on the computer for extended periods of  time.    PT Frequency  1x / week    PT Duration  6 weeks    PT Treatment/Interventions  ADLs/Self Care Home Management;Cryotherapy;Electrical Stimulation;Moist Heat;Neuromuscular re-education;Therapeutic activities;Therapeutic exercise;Patient/family education;Manual techniques;Dry needling;Passive range of motion;Taping;Spinal Manipulations;Joint Manipulations    PT Next Visit Plan  Assess HEP and progress PRN, manual and/or dry needling PRN for suboccipitals/upper trap/levator scap, progress postural control and periscapular strengthening    PT Home Exercise Plan  [email or web address]: Chin tuck, row with blue, extension with scap retraction with green, double ER with scap retraction with green, horiz abduction with red, upper trap and levator stretch, pec stretch supine on foam roller, suboccipital release with peanut, thoracic extension step-back stretch    Consulted and Agree with Plan of Care  Patient       Patient will benefit from skilled therapeutic intervention in order to improve the following deficits and impairments:  Postural dysfunction, Decreased strength, Decreased range of motion, Impaired flexibility, Pain, Decreased activity tolerance  Visit Diagnosis: Cervicalgia  Chronic left shoulder pain  Chronic right shoulder pain  Abnormal posture  Muscle weakness (generalized)     Problem List Patient Active Problem List   Diagnosis Date Noted   Anxiety and depression 05/31/2018   Rh negative, maternal 05/31/2018   Homero Fellers breech presentation 05/29/2018   Status post repeat low transverse cesarean section 05/29/2018   Postpartum care following cesarean delivery (10/24) 05/29/2018    Rosana Hoes, PT, DPT, LAT, ATC 01/07/20  1:07 PM Phone: [phone] Fax: [phone]   [facility] Outpatient Rehabilitation [facility] [address] [place], [place], [address] Phone: [phone]   Fax:  [phone]  Name: Adrienne Williams MRN: [id number] Date of  Birth: 1980/10/24

## 2020-01-14 ENCOUNTER — Other Ambulatory Visit: Payer: Self-pay

## 2020-01-14 ENCOUNTER — Encounter: Payer: Self-pay | Admitting: Physical Therapy

## 2020-01-14 ENCOUNTER — Ambulatory Visit: Payer: Managed Care, Other (non HMO) | Admitting: Physical Therapy

## 2020-01-14 DIAGNOSIS — M542 Cervicalgia: Secondary | ICD-10-CM

## 2020-01-14 DIAGNOSIS — M6281 Muscle weakness (generalized): Secondary | ICD-10-CM

## 2020-01-14 DIAGNOSIS — M25511 Pain in right shoulder: Secondary | ICD-10-CM

## 2020-01-14 DIAGNOSIS — M25512 Pain in left shoulder: Secondary | ICD-10-CM

## 2020-01-14 DIAGNOSIS — R293 Abnormal posture: Secondary | ICD-10-CM

## 2020-01-14 NOTE — Therapy (Signed)
[facility] Outpatient Rehabilitation [facility] [address] [place], [place], [address] Phone: [phone]   Fax:  [phone]  Physical Therapy Treatment  Patient Details  Name: Adrienne Williams MRN: [id number] Date of Birth: 1981-04-26 Referring Provider (PT): Juluis Rainier, MD   Encounter Date: 01/14/2020   PT End of Session - 01/14/20 1038    Visit Number 6    Number of Visits 11    Date for PT Re-Evaluation 02/18/20    Authorization Type [employer]    PT Start Time 1045    PT Stop Time 1125    PT Time Calculation (min) 40 min    Activity Tolerance Patient tolerated treatment well    Behavior During Therapy [facility] for tasks assessed/performed           Past Medical History:  Diagnosis Date  . Anxiety   . Asthma   . Depression   . Frank breech presentation 05/29/2018  . Hemorrhoids   . Hx of varicella   . PONV (postoperative nausea and vomiting)    "little nauseated"  . Postpartum care following cesarean delivery (6/14) 01/18/2016    Past Surgical History:  Procedure Laterality Date  . CESAREAN SECTION N/A 01/18/2016   Procedure: Primary CESAREAN SECTION;  Surgeon: Genia Del, MD;  Location: [facility];  Service: Obstetrics;  Laterality: N/A;  EDD: 01/25/16  . CESAREAN SECTION N/A 05/29/2018   Procedure: Repeat CESAREAN SECTION;  Surgeon: Shea Evans, MD;  Location: [facility] BIRTHING SUIT[facility];  Service: Obstetrics;  Laterality: N/A;  EDD: 06/10/18  . CYSTECTOMY  2002   pilonidal region  . MOLE REMOVAL    . WISDOM TOOTH EXTRACTION      There were no vitals filed for this visit.   Subjective Assessment - 01/14/20 1048    Subjective Patient reports neck is feeling alright, everything is a little tighter due to the rain.    Patient Stated Goals Patient wants to feel better and have tools to relieve pain    Currently in Pain? Yes    Pain Score 3     Pain Location Neck    Pain Orientation Right;Left   L>R   Pain Descriptors / Indicators  Tightness    Pain Type Chronic pain    Pain Onset More than a month ago    Pain Frequency Intermittent                             [facility] Adult PT Treatment/Exercise - 01/14/20 0001      Exercises   Exercises Neck      Neck Exercises: Machines for Strengthening   UBE (Upper Arm Bike) L3 x 4 min (fwd/bwd)      Neck Exercises: Theraband   Shoulder Extension 20 reps;Blue   2 sets   Rows 20 reps   2 sets   Rows Limitations black    Shoulder External Rotation 20 reps;Green   2 sets   Shoulder External Rotation Limitations double    Horizontal ABduction 20 reps;Red   2 sets   Horizontal ABduction Limitations standing      Neck Exercises: Standing   Other Standing Exercises Standing shoulder shrug with 10# 2x15      Neck Exercises: Supine   Other Supine Exercise DNF endurance chin tuck with lift 3x5 10 sec hold      Neck Exercises: Stretches   Upper Trapezius Stretch 2 reps;30 seconds    Levator Stretch 2 reps;30 seconds  Other Neck Stretches Thoracic extension step-back stretch at counter x5 10 sec hold    Other Neck Stretches Sidelying thoracic rotation x5 10 sec hold                  PT Education - 01/14/20 1037    Education Details HEP    Person(s) Educated Patient    Methods Explanation;Demonstration;Verbal cues    Comprehension Verbalized understanding;Returned demonstration;Verbal cues required;Need further instruction            PT Short Term Goals - 01/07/20 1259      PT SHORT TERM GOAL #1   Title Patient will be I with initial HEP to progress in PT    Time 6    Period Weeks    Status On-going    Target Date 02/18/20      PT SHORT TERM GOAL #2   Title Patient will report no tightness or pain with cervical AROM    Time 6    Period Weeks    Status On-going    Target Date 02/18/20      PT SHORT TERM GOAL #3   Title Patient will exhibit improved periscapular strength of >/= 4+/5 MMT so she can work at a computer for > 1 hour  without increase in pain level or tension    Time 6    Period Weeks    Status On-going    Target Date 02/18/20      PT SHORT TERM GOAL #4   Title Patient will report improved functional level of </= 29% limitation on FOTO    Time 6    Period Weeks    Status On-going    Target Date 02/18/20             PT Long Term Goals - 11/12/19 1552      PT LONG TERM GOAL #1   Title STG=LTG                 Plan - 01/14/20 1038    Clinical Impression Statement Patient tolerated therapy well with no adverse effects. Continued progression of periscapular strengthening and incorporated DNF endurance training. She does exhibit limitation in DNF endurance but did not report any increased pain. She noted the left upper trap felt tighter following banded exercises so encouraged to stretch following her strengthening. No change to HEP this visit. She would benefit from continued skilled PT to progress postural control to reduce pain and tension when she is working on the computer for extended periods of time.    PT Treatment/Interventions ADLs/Self Care Home Management;Cryotherapy;Electrical Stimulation;Moist Heat;Neuromuscular re-education;Therapeutic activities;Therapeutic exercise;Patient/family education;Manual techniques;Dry needling;Passive range of motion;Taping;Spinal Manipulations;Joint Manipulations    PT Next Visit Plan Assess HEP and progress PRN, manual and/or dry needling PRN for suboccipitals/upper trap/levator scap, progress postural control and periscapular strengthening    PT Home Exercise Plan [email or web address]: Chin tuck, row with blue, extension with scap retraction with green, double ER with scap retraction with green, horiz abduction with red, upper trap and levator stretch, pec stretch supine on foam roller, suboccipital release with peanut, thoracic extension step-back stretch    Consulted and Agree with Plan of Care Patient           Patient will benefit from skilled therapeutic  intervention in order to improve the following deficits and impairments:  Postural dysfunction, Decreased strength, Decreased range of motion, Impaired flexibility, Pain, Decreased activity tolerance  Visit Diagnosis: Cervicalgia  Chronic left shoulder pain  Chronic right shoulder  pain  Abnormal posture  Muscle weakness (generalized)     Problem List Patient Active Problem List   Diagnosis Date Noted  . Anxiety and depression 05/31/2018  . Rh negative, maternal 05/31/2018  . Frank breech presentation 05/29/2018  . Status post repeat low transverse cesarean section 05/29/2018  . Postpartum care following cesarean delivery (10/24) 05/29/2018    Hilda Blades, PT, DPT, LAT, ATC 01/14/20  11:29 AM Phone: [phone] Fax: [facility] [address] [place], [place], [address] Phone: [phone]   Fax:  [phone]  Name: Adrienne Williams MRN: [id number] Date of Birth: 04-07-1981

## 2020-01-21 ENCOUNTER — Encounter: Payer: Self-pay | Admitting: Physical Therapy

## 2020-01-21 ENCOUNTER — Other Ambulatory Visit: Payer: Self-pay

## 2020-01-21 ENCOUNTER — Ambulatory Visit: Payer: Managed Care, Other (non HMO) | Admitting: Physical Therapy

## 2020-01-21 DIAGNOSIS — R293 Abnormal posture: Secondary | ICD-10-CM

## 2020-01-21 DIAGNOSIS — M542 Cervicalgia: Secondary | ICD-10-CM | POA: Diagnosis not present

## 2020-01-21 DIAGNOSIS — G8929 Other chronic pain: Secondary | ICD-10-CM

## 2020-01-21 DIAGNOSIS — M25512 Pain in left shoulder: Secondary | ICD-10-CM

## 2020-01-21 DIAGNOSIS — M6281 Muscle weakness (generalized): Secondary | ICD-10-CM

## 2020-01-21 NOTE — Patient Instructions (Signed)
Access Code: [email or web address] URL: [email or web address] Date: 01/21/2020 Prepared by: Rosana Hoes  Exercises Seated Cervical Retraction - 2 x daily - 7 x weekly - 10 reps - 3 seconds hold Standing Bilateral Low Shoulder Row with Anchored Resistance - 1 x daily - 7 x weekly - 2 sets - 15-20 reps Scapular Retraction with Resistance Advanced - 1 x daily - 7 x weekly - 2 sets - 15-20 reps Shoulder External Rotation and Scapular Retraction with Resistance - 1 x daily - 7 x weekly - 2 sets - 15-20 reps Standing Shoulder Horizontal Abduction with Resistance - 1 x daily - 7 x weekly - 2 sets - 15-20 reps Gentle Upper Trap Stretch - 2 x daily - 7 x weekly - 2 reps - 15 seconds hold Gentle Levator Scapulae Stretch - 2 x daily - 7 x weekly - 2 reps - 15 seconds hold Supine Suboccipital Release with Tennis Balls - 2 x daily - 7 x weekly - 10 reps Supine Chest Stretch on Foam Roll - 2 x daily - 7 x weekly - 30-60 seconds hold Step Back Shoulder Stretch with Chair - 2 x daily - 7 x weekly - 5 reps - 5-10 seconds hold Quadruped Full Range Thoracic Rotation with Reach - 2 x daily - 7 x weekly - 10 reps

## 2020-01-21 NOTE — Therapy (Signed)
[facility] Outpatient Rehabilitation [facility] [address] [place], [place], [address] Phone: [phone]   Fax:  [phone]  Physical Therapy Treatment  Patient Details  Name: Adrienne Williams MRN: [id number] Date of Birth: 1981/03/08 Referring Provider (PT): Juluis Rainier, MD   Encounter Date: 01/21/2020   PT End of Session - 01/21/20 1401    Visit Number 7    Number of Visits 11    Date for PT Re-Evaluation 02/18/20    Authorization Type [employer]    PT Start Time 1358    PT Stop Time 1437    PT Time Calculation (min) 39 min    Activity Tolerance Patient tolerated treatment well    Behavior During Therapy [facility] for tasks assessed/performed           Past Medical History:  Diagnosis Date  . Anxiety   . Asthma   . Depression   . Frank breech presentation 05/29/2018  . Hemorrhoids   . Hx of varicella   . PONV (postoperative nausea and vomiting)    "little nauseated"  . Postpartum care following cesarean delivery (6/14) 01/18/2016    Past Surgical History:  Procedure Laterality Date  . CESAREAN SECTION N/A 01/18/2016   Procedure: Primary CESAREAN SECTION;  Surgeon: Genia Del, MD;  Location: [facility];  Service: Obstetrics;  Laterality: N/A;  EDD: 01/25/16  . CESAREAN SECTION N/A 05/29/2018   Procedure: Repeat CESAREAN SECTION;  Surgeon: Shea Evans, MD;  Location: [facility] BIRTHING SUIT[facility];  Service: Obstetrics;  Laterality: N/A;  EDD: 06/10/18  . CYSTECTOMY  2002   pilonidal region  . MOLE REMOVAL    . WISDOM TOOTH EXTRACTION      There were no vitals filed for this visit.   Subjective Assessment - 01/21/20 1356    Subjective Patient reports she is doing well, she has had more pain recently due to working a lot. She feels like it is still an endurance thing, pain is more back of neck and head.    Patient Stated Goals Patient wants to feel better and have tools to relieve pain    Currently in Pain? Yes    Pain Score 4     Pain  Location Neck    Pain Orientation Right;Left    Pain Descriptors / Indicators Tightness    Pain Type Chronic pain    Pain Onset More than a month ago    Pain Frequency Intermittent    Aggravating Factors  Work activities              [facility] PT Assessment - 01/21/20 0001      Assessment   Medical Diagnosis Muscle pain in neck and shoulders    Referring Provider (PT) Juluis Rainier, MD      Precautions   Precautions None      Restrictions   Weight Bearing Restrictions No                         [facility] Adult PT Treatment/Exercise - 01/21/20 0001      Exercises   Exercises Neck      Neck Exercises: Machines for Strengthening   UBE (Upper Arm Bike) L3 x 4 min (fwd/bwd)      Neck Exercises: Theraband   Shoulder Extension 15 reps;Blue   2 sets   Rows 15 reps;Blue   2 sets     Neck Exercises: Supine   Other Supine Exercise DNF endurance chin tuck into towel x10 3  sec hold, with lift 3x5 10 sec hold      Neck Exercises: Prone   Other Prone Exercise Quadruped thread the needle / upward reach thoracic rotation x10 each      Neck Exercises: Stretches   Upper Trapezius Stretch 2 reps;30 seconds    Levator Stretch 2 reps;30 seconds    Other Neck Stretches Thoracic extension stretch in child's pose with foam roller x5 10 sec hold    Other Neck Stretches Sidelying thoracic rotation x 10, addded yellow band for 2nd set                  PT Education - 01/21/20 1400    Education Details HEP    Person(s) Educated Patient    Methods Explanation;Demonstration;Verbal cues    Comprehension Verbalized understanding;Returned demonstration;Verbal cues required;Need further instruction            PT Short Term Goals - 01/07/20 1259      PT SHORT TERM GOAL #1   Title Patient will be I with initial HEP to progress in PT    Time 6    Period Weeks    Status On-going    Target Date 02/18/20      PT SHORT TERM GOAL #2   Title Patient will report no  tightness or pain with cervical AROM    Time 6    Period Weeks    Status On-going    Target Date 02/18/20      PT SHORT TERM GOAL #3   Title Patient will exhibit improved periscapular strength of >/= 4+/5 MMT so she can work at a computer for > 1 hour without increase in pain level or tension    Time 6    Period Weeks    Status On-going    Target Date 02/18/20      PT SHORT TERM GOAL #4   Title Patient will report improved functional level of </= 29% limitation on FOTO    Time 6    Period Weeks    Status On-going    Target Date 02/18/20             PT Long Term Goals - 11/12/19 1552      PT LONG TERM GOAL #1   Title STG=LTG                 Plan - 01/21/20 1402    Clinical Impression Statement Patient tolerated therapy well with no adverse effects. Incorporated more thoracic mobility exercises and stretching in this visit and continued DNF endurance training with good tolerance. She seems to be improving overall but continues to report pain increase with longer durations of work. Her tightness seemed to be more levator and suboccipital related this visit. She would benefit from continued skilled PT to progress postural control to reduce pain and tension when she is working on the computer for extended periods of time.    PT Treatment/Interventions ADLs/Self Care Home Management;Cryotherapy;Electrical Stimulation;Moist Heat;Neuromuscular re-education;Therapeutic activities;Therapeutic exercise;Patient/family education;Manual techniques;Dry needling;Passive range of motion;Taping;Spinal Manipulations;Joint Manipulations    PT Next Visit Plan Assess HEP and progress PRN, manual and/or dry needling PRN for suboccipitals/upper trap/levator scap, progress postural control and periscapular strengthening    PT Home Exercise Plan [email or web address]: Chin tuck, row with blue, extension with scap retraction with green, double ER with scap retraction with green, horiz abduction with red, upper  trap and levator stretch, pec stretch supine on foam roller, suboccipital release with peanut, thoracic extension step-back stretch,  quadruped thoracic rotation    Consulted and Agree with Plan of Care Patient           Patient will benefit from skilled therapeutic intervention in order to improve the following deficits and impairments:  Postural dysfunction, Decreased strength, Decreased range of motion, Impaired flexibility, Pain, Decreased activity tolerance  Visit Diagnosis: Cervicalgia  Chronic left shoulder pain  Chronic right shoulder pain  Abnormal posture  Muscle weakness (generalized)     Problem List Patient Active Problem List   Diagnosis Date Noted  . Anxiety and depression 05/31/2018  . Rh negative, maternal 05/31/2018  . Frank breech presentation 05/29/2018  . Status post repeat low transverse cesarean section 05/29/2018  . Postpartum care following cesarean delivery (10/24) 05/29/2018    Rosana Hoes, PT, DPT, LAT, ATC 01/21/20  2:45 PM Phone: [phone] Fax: [phone]   [facility] Outpatient Rehabilitation [facility] [address] [place], [place], [address] Phone: [phone]   Fax:  [phone]  Name: Adrienne Williams MRN: [id number] Date of Birth: February 08, 1981

## 2020-01-29 ENCOUNTER — Ambulatory Visit: Payer: Managed Care, Other (non HMO) | Admitting: Physical Therapy

## 2020-01-29 ENCOUNTER — Other Ambulatory Visit: Payer: Self-pay

## 2020-01-29 DIAGNOSIS — M542 Cervicalgia: Secondary | ICD-10-CM

## 2020-01-29 DIAGNOSIS — M6281 Muscle weakness (generalized): Secondary | ICD-10-CM

## 2020-01-29 DIAGNOSIS — R293 Abnormal posture: Secondary | ICD-10-CM

## 2020-01-29 DIAGNOSIS — M25512 Pain in left shoulder: Secondary | ICD-10-CM

## 2020-01-29 DIAGNOSIS — M25511 Pain in right shoulder: Secondary | ICD-10-CM

## 2020-01-29 NOTE — Therapy (Signed)
[facility] [address] [place], [place], [address] Phone: [phone]   Fax:  [phone]  Physical Therapy Treatment  Patient Details  Name: Adrienne Williams MRN: [id number] Date of Birth: [birth year] Referring Provider (PT): Juluis Rainier, MD   Encounter Date: 01/29/2020   PT End of Session - 01/29/20 1417    Visit Number 8    Number of Visits 11    Date for PT Re-Evaluation 02/18/20    Authorization Type [employer]    PT Start Time 1100    PT Stop Time 1146    PT Time Calculation (min) 46 min    Activity Tolerance Patient tolerated treatment well    Behavior During Therapy [facility] for tasks assessed/performed           Past Medical History:  Diagnosis Date  . Anxiety   . Asthma   . Depression   . Frank breech presentation 05/29/2018  . Hemorrhoids   . Hx of varicella   . PONV (postoperative nausea and vomiting)    "little nauseated"  . Postpartum care following cesarean delivery (6/14) 01/18/2016    Past Surgical History:  Procedure Laterality Date  . CESAREAN SECTION N/A 01/18/2016   Procedure: Primary CESAREAN SECTION;  Surgeon: Genia Del, MD;  Location: [facility];  Service: Obstetrics;  Laterality: N/A;  EDD: 01/25/16  . CESAREAN SECTION N/A 05/29/2018   Procedure: Repeat CESAREAN SECTION;  Surgeon: Shea Evans, MD;  Location: [facility] BIRTHING SUITES;  Service: Obstetrics;  Laterality: N/A;  EDD: 06/10/18  . CYSTECTOMY  2002   pilonidal region  . MOLE REMOVAL    . WISDOM TOOTH EXTRACTION      There were no vitals filed for this visit.   Subjective Assessment - 01/29/20 1414    Subjective Improved thoracic/periscapular region pain but noting upper suboccipital and upper trapezius region discomfort.    Pertinent History Chronic pain    Currently in Pain? Yes    Pain Score 3     Pain Location Neck    Pain Orientation Right;Left    Pain Descriptors / Indicators Tightness    Pain Type Chronic  pain    Pain Onset More than a month ago    Pain Frequency Intermittent    Aggravating Factors  work activities    Pain Relieving Factors yoga, stretches    Effect of Pain on Daily Activities limits positional and activty tolerance.                             [facility] Adult PT Treatment/Exercise - 01/29/20 0001      Manual Therapy   Manual Therapy Joint mobilization    Joint Mobilization cervical lateral glides grade I-III    Manual Traction cervical manual traction and suboccipital release      Neck Exercises: Stretches   Upper Trapezius Stretch Right;Left;3 reps;30 seconds    Levator Stretch Right;Left;3 reps;30 seconds            Trigger Point Dry Needling - 01/29/20 0001    Consent Given? Yes    Muscles Treated Head and Neck Upper trapezius;Oblique capitus;Levator scapulae    Dry Needling Comments needling to bilat. sides in prone with 32 gauge 30 mm needles    Electrical Stimulation Performed with Dry Needling Yes    E-stim with Dry Needling Details TENS 20 pps x 10 minutes to bilat. upper trapezius and levator region  PT Short Term Goals - 01/07/20 1259      PT SHORT TERM GOAL #1   Title Patient will be I with initial HEP to progress in PT    Time 6    Period Weeks    Status On-going    Target Date 02/18/20      PT SHORT TERM GOAL #2   Title Patient will report no tightness or pain with cervical AROM    Time 6    Period Weeks    Status On-going    Target Date 02/18/20      PT SHORT TERM GOAL #3   Title Patient will exhibit improved periscapular strength of >/= 4+/5 MMT so she can work at a computer for > 1 hour without increase in pain level or tension    Time 6    Period Weeks    Status On-going    Target Date 02/18/20      PT SHORT TERM GOAL #4   Title Patient will report improved functional level of </= 29% limitation on FOTO    Time 6    Period Weeks    Status On-going    Target Date 02/18/20              PT Long Term Goals - 11/12/19 1552      PT LONG TERM GOAL #1   Title STG=LTG                 Plan - 01/29/20 1417    Clinical Impression Statement Focus manual therapy to decrease suboccipital tension and performed dry needling to suboccipital and levator/upper trap region for associated trigger points/myofascial pain. Still improving from baseline but continues with tendency symptom exacerbation from work activities.    Personal Factors and Comorbidities Time since onset of injury/illness/exacerbation;Past/Current Experience    Examination-Activity Limitations Sit    Examination-Participation Restrictions Other    Stability/Clinical Decision Making Stable/Uncomplicated    Clinical Decision Making Low    Rehab Potential Good    PT Frequency 1x / week    PT Duration 6 weeks    PT Treatment/Interventions ADLs/Self Care Home Management;Cryotherapy;Electrical Stimulation;Moist Heat;Neuromuscular re-education;Therapeutic activities;Therapeutic exercise;Patient/family education;Manual techniques;Dry needling;Passive range of motion;Taping;Spinal Manipulations;Joint Manipulations    PT Next Visit Plan Assess HEP and progress PRN, manual and/or dry needling PRN for suboccipitals/upper trap/levator scap, progress postural control and periscapular strengthening    PT Home Exercise Plan [id number]: Chin tuck, row with blue, extension with scap retraction with green, double ER with scap retraction with green, horiz abduction with red, upper trap and levator stretch, pec stretch supine on foam roller, suboccipital release with peanut, thoracic extension step-back stretch, quadruped thoracic rotation    Consulted and Agree with Plan of Care Patient           Patient will benefit from skilled therapeutic intervention in order to improve the following deficits and impairments:  Postural dysfunction, Decreased strength, Decreased range of motion, Impaired flexibility, Pain, Decreased activity  tolerance  Visit Diagnosis: Cervicalgia  Chronic left shoulder pain  Chronic right shoulder pain  Abnormal posture  Muscle weakness (generalized)     Problem List Patient Active Problem List   Diagnosis Date Noted  . Anxiety and depression 05/31/2018  . Rh negative, maternal 05/31/2018  . Frank breech presentation 05/29/2018  . Status post repeat low transverse cesarean section 05/29/2018  . Postpartum care following cesarean delivery (10/24) 05/29/2018    Lazarus Gowda, PT, DPT 01/29/20 2:21 PM  [place] [facility] [address] [place], [place], [address] Phone: [phone]   Fax:  [phone]  Name: Adrienne Williams MRN: [id number] Date of Birth: 02-14-1981

## 2020-02-04 ENCOUNTER — Ambulatory Visit: Payer: Managed Care, Other (non HMO) | Attending: Family Medicine | Admitting: Physical Therapy

## 2020-02-04 ENCOUNTER — Other Ambulatory Visit: Payer: Self-pay

## 2020-02-04 ENCOUNTER — Encounter: Payer: Self-pay | Admitting: Physical Therapy

## 2020-02-04 DIAGNOSIS — M542 Cervicalgia: Secondary | ICD-10-CM | POA: Diagnosis present

## 2020-02-04 DIAGNOSIS — G8929 Other chronic pain: Secondary | ICD-10-CM | POA: Diagnosis present

## 2020-02-04 DIAGNOSIS — M6281 Muscle weakness (generalized): Secondary | ICD-10-CM | POA: Diagnosis present

## 2020-02-04 DIAGNOSIS — M25512 Pain in left shoulder: Secondary | ICD-10-CM | POA: Insufficient documentation

## 2020-02-04 DIAGNOSIS — R293 Abnormal posture: Secondary | ICD-10-CM | POA: Diagnosis present

## 2020-02-04 DIAGNOSIS — M25511 Pain in right shoulder: Secondary | ICD-10-CM | POA: Diagnosis present

## 2020-02-04 NOTE — Therapy (Signed)
[facility] Outpatient Rehabilitation [facility] [address] [place], [place], [address] Phone: [phone]   Fax:  [phone]  Physical Therapy Treatment  Patient Details  Name: Adrienne Williams MRN: [id number] Date of Birth: [birth year] Referring Provider (PT): Juluis Rainier, MD   Encounter Date: 02/04/2020   PT End of Session - 02/04/20 1424    Visit Number 9    Number of Visits 11    Date for PT Re-Evaluation 02/18/20    Authorization Type [employer]    PT Start Time 1328    PT Stop Time 1415    PT Time Calculation (min) 47 min    Activity Tolerance Patient tolerated treatment well    Behavior During Therapy [facility] for tasks assessed/performed           Past Medical History:  Diagnosis Date  . Anxiety   . Asthma   . Depression   . Frank breech presentation 05/29/2018  . Hemorrhoids   . Hx of varicella   . PONV (postoperative nausea and vomiting)    "little nauseated"  . Postpartum care following cesarean delivery (6/14) 01/18/2016    Past Surgical History:  Procedure Laterality Date  . CESAREAN SECTION N/A 01/18/2016   Procedure: Primary CESAREAN SECTION;  Surgeon: Genia Del, MD;  Location: [facility];  Service: Obstetrics;  Laterality: N/A;  EDD: 01/25/16  . CESAREAN SECTION N/A 05/29/2018   Procedure: Repeat CESAREAN SECTION;  Surgeon: Shea Evans, MD;  Location: [facility]IRTHING SUIT[facility];  Service: Obstetrics;  Laterality: N/A;  EDD: 06/10/18  . CYSTECTOMY  2002   pilonidal region  . MOLE REMOVAL    . WISDOM TOOTH EXTRACTION      There were no vitals filed for this visit.   Subjective Assessment - 02/04/20 1419    Subjective Last session helped ease upper trap region tension. Primary complaint today is more lateral/anterior neck pain in SCM region with associated tightness.    Pertinent History Chronic pain    Currently in Pain? Yes    Pain Score 4     Pain Location Neck    Pain Orientation Right;Left    Pain Descriptors /  Indicators Tightness    Pain Type Chronic pain    Pain Onset More than a month ago    Pain Frequency Intermittent    Aggravating Factors  work activities    Pain Relieving Factors yoga, stretches    Effect of Pain on Daily Activities limits positional and activity tolerance              [facility] PT Assessment - 02/04/20 0001      Observation/Other Assessments   Focus on Therapeutic Outcomes (FOTO)  30% limited      AROM   Cervical - Right Rotation 75    Cervical - Left Rotation 70                         [facility] Adult PT Treatment/Exercise - 02/04/20 0001      Manual Therapy   Joint Mobilization 1st ribs mobs grade I-III bilat. in sitting, brief thoracic PAs grade I-III    Soft tissue mobilization gentle STM bilat. SCM    Manual Traction cervical manual traction and suboccipital release      Neck Exercises: Stretches   Other Neck Stretches SCM/scalene stretches: supine manual stretch 20-30 sec holds x 3 ea. bilat., seated stretch for HEP with strap practice ea. side bilat.  Trigger Point Dry Needling - 02/04/20 0001    Consent Given? Yes    Muscles Treated Head and Neck Sternocleidomastoid;Upper trapezius;Levator scapulae    Dry Needling Comments SCM needled in supine, upper trap and levator needling in prone, both sides needles with 32 gauge 30 mm needles    Electrical Stimulation Performed with Dry Needling Yes    E-stim with Dry Needling Details TENS 20 pps x 10 minutes to bilat. upper trapezius and levator region   to bilat. upper trapezius and levator                PT Education - 02/04/20 1424    Education Details HEP updates    Person(s) Educated Patient    Methods Explanation;Demonstration;Verbal cues;Handout    Comprehension Returned demonstration;Verbalized understanding            PT Short Term Goals - 01/07/20 1259      PT SHORT TERM GOAL #1   Title Patient will be I with initial HEP to progress in PT    Time 6     Period Weeks    Status On-going    Target Date 02/18/20      PT SHORT TERM GOAL #2   Title Patient will report no tightness or pain with cervical AROM    Time 6    Period Weeks    Status On-going    Target Date 02/18/20      PT SHORT TERM GOAL #3   Title Patient will exhibit improved periscapular strength of >/= 4+/5 MMT so she can work at a computer for > 1 hour without increase in pain level or tension    Time 6    Period Weeks    Status On-going    Target Date 02/18/20      PT SHORT TERM GOAL #4   Title Patient will report improved functional level of </= 29% limitation on FOTO    Time 6    Period Weeks    Status On-going    Target Date 02/18/20             PT Long Term Goals - 11/12/19 1552      PT LONG TERM GOAL #1   Title STG=LTG                 Plan - 02/04/20 1424    Clinical Impression Statement More focus SCM region bilat. with stretches, manual and dry needling to address muscle tightness/spasm. Cervical rotation ROM consistent with last measures but pt. making functional gains as evidenced by 9% improve in FOTO outcome measure from last status.    Personal Factors and Comorbidities Time since onset of injury/illness/exacerbation;Past/Current Experience    Examination-Activity Limitations Sit    Examination-Participation Restrictions Other    Stability/Clinical Decision Making Stable/Uncomplicated    Clinical Decision Making Low    Rehab Potential Good    PT Frequency 1x / week    PT Duration 6 weeks    PT Treatment/Interventions ADLs/Self Care Home Management;Cryotherapy;Electrical Stimulation;Moist Heat;Neuromuscular re-education;Therapeutic activities;Therapeutic exercise;Patient/family education;Manual techniques;Dry needling;Passive range of motion;Taping;Spinal Manipulations;Joint Manipulations    PT Next Visit Plan Assess HEP and progress PRN, manual and/or dry needling PRN for suboccipitals/upper trap/levator scap, progress postural control  and periscapular strengthening    PT Home Exercise Plan [id number]: SCM/scalene stretche  and 1st rib mobs, chin tuck, row with blue, extension with scap retraction with green, double ER with scap retraction with green, horiz abduction with red, upper trap and levator  stretch, pec stretch supine on foam roller, suboccipital release with peanut, thoracic extension step-back stretch, quadruped thoracic rotation    Consulted and Agree with Plan of Care Patient           Patient will benefit from skilled therapeutic intervention in order to improve the following deficits and impairments:  Postural dysfunction, Decreased strength, Decreased range of motion, Impaired flexibility, Pain, Decreased activity tolerance  Visit Diagnosis: Cervicalgia  Chronic left shoulder pain  Chronic right shoulder pain  Abnormal posture  Muscle weakness (generalized)     Problem List Patient Active Problem List   Diagnosis Date Noted  . Anxiety and depression 05/31/2018  . Rh negative, maternal 05/31/2018  . Frank breech presentation 05/29/2018  . Status post repeat low transverse cesarean section 05/29/2018  . Postpartum care following cesarean delivery (10/24) 05/29/2018   Lazarus Gowda, PT, DPT 02/04/20 2:28 PM  [facility] [address] [place], [place], [address] Phone: [phone]   Fax:  [phone]  Name: Adrienne Williams MRN: [id number] Date of Birth: 1980-08-30

## 2020-02-11 ENCOUNTER — Encounter: Payer: Self-pay | Admitting: Physical Therapy

## 2020-02-11 ENCOUNTER — Other Ambulatory Visit: Payer: Self-pay

## 2020-02-11 ENCOUNTER — Ambulatory Visit: Payer: Managed Care, Other (non HMO) | Admitting: Physical Therapy

## 2020-02-11 DIAGNOSIS — G8929 Other chronic pain: Secondary | ICD-10-CM

## 2020-02-11 DIAGNOSIS — M542 Cervicalgia: Secondary | ICD-10-CM | POA: Diagnosis not present

## 2020-02-11 DIAGNOSIS — R293 Abnormal posture: Secondary | ICD-10-CM

## 2020-02-11 DIAGNOSIS — M6281 Muscle weakness (generalized): Secondary | ICD-10-CM

## 2020-02-11 NOTE — Therapy (Signed)
[facility] Outpatient Rehabilitation [facility] [address] [place], [place], [address] Phone: [phone]   Fax:  [phone]  Physical Therapy Treatment  Patient Details  Name: Adrienne Williams MRN: [id number] Date of Birth: September 17, 1980 Referring Provider (PT): Juluis Rainier, MD   Encounter Date: 02/11/2020   PT End of Session - 02/11/20 1440    Visit Number 10    Number of Visits 11    Date for PT Re-Evaluation 02/18/20    Authorization Type [employer]    PT Start Time 1332    PT Stop Time 1421    PT Time Calculation (min) 49 min    Activity Tolerance Patient tolerated treatment well    Behavior During Therapy [facility] for tasks assessed/performed           Past Medical History:  Diagnosis Date  . Anxiety   . Asthma   . Depression   . Frank breech presentation 05/29/2018  . Hemorrhoids   . Hx of varicella   . PONV (postoperative nausea and vomiting)    "little nauseated"  . Postpartum care following cesarean delivery (6/14) 01/18/2016    Past Surgical History:  Procedure Laterality Date  . CESAREAN SECTION N/A 01/18/2016   Procedure: Primary CESAREAN SECTION;  Surgeon: Genia Del, MD;  Location: [facility];  Service: Obstetrics;  Laterality: N/A;  EDD: 01/25/16  . CESAREAN SECTION N/A 05/29/2018   Procedure: Repeat CESAREAN SECTION;  Surgeon: Shea Evans, MD;  Location: [facility] BIRTHING SUITES;  Service: Obstetrics;  Laterality: N/A;  EDD: 06/10/18  . CYSTECTOMY  2002   pilonidal region  . MOLE REMOVAL    . WISDOM TOOTH EXTRACTION      There were no vitals filed for this visit.   Subjective Assessment - 02/11/20 1430    Subjective Primary complaint is tightness in right upper trapezius region. See plan/assessment.    Currently in Pain? Yes    Pain Score 3     Pain Location Neck    Pain Orientation Right    Pain Descriptors / Indicators Tightness    Pain Type Chronic pain    Pain Onset More than a month ago    Pain Frequency  Intermittent    Aggravating Factors  work activities    Pain Relieving Factors yoga, stretches    Effect of Pain on Daily Activities limits positional and activity tolerance                             [facility] Adult PT Treatment/Exercise - 02/11/20 0001      Manual Therapy   Joint Mobilization thoracic PAs grade I-IV    Soft tissue mobilization STM in prone and sitting right>left upper trapezius region    Manual Traction cervical manual traction and suboccipital release      Neck Exercises: Stretches   Upper Trapezius Stretch Right;Left;3 reps;30 seconds    Levator Stretch Right;Left;3 reps;30 seconds            Trigger Point Dry Needling - 02/11/20 0001    Consent Given? Yes    Muscles Treated Head and Neck Upper trapezius;Levator scapulae    Dry Needling Comments needling bilat. in prone with 32 gauge 30 mm needles    Electrical Stimulation Performed with Dry Needling Yes    E-stim with Dry Needling Details TENS 20 pps x 10 minutes                PT Education -  02/11/20 1440    Education Details POC    Person(s) Educated Patient    Methods Explanation    Comprehension Verbalized understanding            PT Short Term Goals - 01/07/20 1259      PT SHORT TERM GOAL #1   Title Patient will be I with initial HEP to progress in PT    Time 6    Period Weeks    Status On-going    Target Date 02/18/20      PT SHORT TERM GOAL #2   Title Patient will report no tightness or pain with cervical AROM    Time 6    Period Weeks    Status On-going    Target Date 02/18/20      PT SHORT TERM GOAL #3   Title Patient will exhibit improved periscapular strength of >/= 4+/5 MMT so she can work at a computer for > 1 hour without increase in pain level or tension    Time 6    Period Weeks    Status On-going    Target Date 02/18/20      PT SHORT TERM GOAL #4   Title Patient will report improved functional level of </= 29% limitation on FOTO    Time 6     Period Weeks    Status On-going    Target Date 02/18/20             PT Long Term Goals - 11/12/19 1552      PT LONG TERM GOAL #1   Title STG=LTG                 Plan - 02/11/20 1440    Clinical Impression Statement Still improved from baseline but tendency ongoing chronic muscle tension issues in upper trap and cervical region. Plan at least one more remaining scheduled visit next week but pending status possible d/c to HEP. If pt. needs continued dry needling she may consider regular maintenance visits with acupuncturist to help more long term with symptom management.    Personal Factors and Comorbidities Time since onset of injury/illness/exacerbation;Past/Current Experience    Examination-Activity Limitations Sit    Examination-Participation Restrictions Other    Stability/Clinical Decision Making Stable/Uncomplicated    Clinical Decision Making Low    Rehab Potential Good    PT Frequency 1x / week    PT Duration 6 weeks    PT Treatment/Interventions ADLs/Self Care Home Management;Cryotherapy;Electrical Stimulation;Moist Heat;Neuromuscular re-education;Therapeutic activities;Therapeutic exercise;Patient/family education;Manual techniques;Dry needling;Passive range of motion;Taping;Spinal Manipulations;Joint Manipulations    PT Next Visit Plan possible d/c to HEP, review/update HEP as needed, continue exercise progression for postural strengthening and DNF endurance    PT Home Exercise Plan [id number]: SCM/scalene stretche  and 1st rib mobs, chin tuck, row with blue, extension with scap retraction with green, double ER with scap retraction with green, horiz abduction with red, upper trap and levator stretch, pec stretch supine on foam roller, suboccipital release with peanut, thoracic extension step-back stretch, quadruped thoracic rotation    Consulted and Agree with Plan of Care Patient           Patient will benefit from skilled therapeutic intervention in order to  improve the following deficits and impairments:  Postural dysfunction, Decreased strength, Decreased range of motion, Impaired flexibility, Pain, Decreased activity tolerance  Visit Diagnosis: Cervicalgia  Chronic left shoulder pain  Chronic right shoulder pain  Abnormal posture  Muscle weakness (generalized)     Problem  List Patient Active Problem List   Diagnosis Date Noted  . Anxiety and depression 05/31/2018  . Rh negative, maternal 05/31/2018  . Frank breech presentation 05/29/2018  . Status post repeat low transverse cesarean section 05/29/2018  . Postpartum care following cesarean delivery (10/24) 05/29/2018    Lazarus Gowda, PT, DPT 02/11/20 2:44 PM  [facility] [address] [place], [place], [address] Phone: [phone]   Fax:  [phone]  Name: Adrienne Williams MRN: [id number] Date of Birth: [birth year]

## 2020-02-18 ENCOUNTER — Ambulatory Visit: Payer: Managed Care, Other (non HMO) | Admitting: Physical Therapy

## 2020-03-03 ENCOUNTER — Ambulatory Visit: Payer: Managed Care, Other (non HMO) | Admitting: Physical Therapy

## 2020-03-03 ENCOUNTER — Other Ambulatory Visit: Payer: Self-pay

## 2020-03-03 ENCOUNTER — Encounter: Payer: Self-pay | Admitting: Physical Therapy

## 2020-03-03 DIAGNOSIS — M542 Cervicalgia: Secondary | ICD-10-CM

## 2020-03-03 DIAGNOSIS — G8929 Other chronic pain: Secondary | ICD-10-CM

## 2020-03-03 DIAGNOSIS — R293 Abnormal posture: Secondary | ICD-10-CM

## 2020-03-03 DIAGNOSIS — M6281 Muscle weakness (generalized): Secondary | ICD-10-CM

## 2020-03-03 NOTE — Therapy (Signed)
[place], [place], [address] Phone: [phone]   Fax:  [phone]  Physical Therapy Treatment / Discharge  Patient Details  Name: NYASHIA RANEY MRN: [id number] Date of Birth: Oct 07, 1980 Referring Provider (PT): Leighton Ruff, MD   Encounter Date: 03/03/2020   PT End of Session - 03/03/20 1613    Visit Number 11    Number of Visits 11    Authorization Type [employer]    PT Start Time 5364    PT Stop Time 1650    PT Time Calculation (min) 40 min    Activity Tolerance Patient tolerated treatment well    Behavior During Therapy [facility] for tasks assessed/performed           Past Medical History:  Diagnosis Date  . Anxiety   . Asthma   . Depression   . Frank breech presentation 05/29/2018  . Hemorrhoids   . Hx of varicella   . PONV (postoperative nausea and vomiting)    "little nauseated"  . Postpartum care following cesarean delivery (6/14) 01/18/2016    Past Surgical History:  Procedure Laterality Date  . CESAREAN SECTION N/A 01/18/2016   Procedure: Primary CESAREAN SECTION;  Surgeon: Princess Bruins, MD;  Location: [place];  Service: Obstetrics;  Laterality: N/A;  EDD: 01/25/16  . CESAREAN SECTION N/A 05/29/2018   Procedure: Repeat CESAREAN SECTION;  Surgeon: Azucena Fallen, MD;  Location: [place];  Service: Obstetrics;  Laterality: N/A;  EDD: 06/10/18  . CYSTECTOMY  2002   pilonidal region  . MOLE REMOVAL    . WISDOM TOOTH EXTRACTION      There were no vitals filed for this visit.   Subjective Assessment - 03/03/20 1646    Subjective Patient reports left sided upper trap and neck tightness. She worked 12 hours yesterday and her daughter has been home so she has had to hold her daughter more which has contributed to increased tightness today. She is independent with HEP and feels if she remains consistent she can continue to improve.    How long can you sit  comfortably? No limitation    How long can you stand comfortably? No limitation    How long can you walk comfortably? No limitation    Patient Stated Goals Patient wants to feel better and have tools to relieve pain    Currently in Pain? Yes    Pain Score 3     Pain Location Neck    Pain Orientation Left    Pain Descriptors / Indicators Tightness    Pain Type Chronic pain    Pain Onset More than a month ago    Pain Frequency Intermittent    Aggravating Factors  work, posture    Pain Relieving Factors exercises              [facility] PT Assessment - 03/03/20 0001      Assessment   Medical Diagnosis Muscle pain in neck and shoulders    Referring Provider (PT) Leighton Ruff, MD    Next MD Visit Not scheduled      Precautions   Precautions None      Restrictions   Weight Bearing Restrictions No      Balance Screen   Has the patient fallen in the past 6 months No    Has the patient had a decrease in activity level because of a fear of falling?  No    Is the patient reluctant to leave their  home because of a fear of falling?  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      Cognition   Overall Cognitive Status Within Functional Limits for tasks assessed      Observation/Other Assessments   Observations Patient appears in no apparent distress    Focus on Therapeutic Outcomes (FOTO)  26% limitation      Posture/Postural Control   Posture Comments Patient exhibits improved posture, mild forward head      AROM   Overall AROM Comments Tightness reported left upper trap and neck    Cervical Flexion 48    Cervical Extension 50    Cervical - Right Side Bend 42    Cervical - Left Side Bend 45    Cervical - Right Rotation 75    Cervical - Left Rotation 70      Strength   Overall Strength Comments Periscapular strength grossly 4+/5 MMT bilaterally                          [facility] Adult PT Treatment/Exercise - 03/03/20 0001      Exercises   Exercises Neck   reviewed current HEP     Neck Exercises: Machines for Strengthening   UBE (Upper Arm Bike) L3 x 4 min (fwd/bwd)      Neck Exercises: Theraband   Shoulder Extension 20 reps;Blue    Rows 20 reps    Rows Limitations black    Shoulder External Rotation 20 reps;Blue    Horizontal ABduction 20 reps;Blue      Neck Exercises: Stretches   Upper Trapezius Stretch 20 seconds    Levator Stretch 20 seconds                  PT Education - 03/03/20 1648    Education Details HEP finalized, POC, FOTO, seeking continued dry needling as needed    Person(s) Educated Patient    Methods Explanation;Demonstration    Comprehension Verbalized understanding;Returned demonstration            PT Short Term Goals - 03/03/20 1621      PT SHORT TERM GOAL #1   Title Patient will be I with initial HEP to progress in PT    Time 6    Period Weeks    Status Achieved      PT SHORT TERM GOAL #2   Title Patient will report no tightness or pain with cervical AROM    Baseline Patient exhibits AROM grossly WFL but reports left sided tightness    Time 6    Period Weeks    Status Partially Met      PT SHORT TERM GOAL #3   Title Patient will exhibit improved periscapular strength of >/= 4+/5 MMT so she can work at a computer for > 1 hour without increase in pain level or tension    Time 6    Period Weeks    Status Achieved      PT SHORT TERM GOAL #4   Title Patient will report improved functional level of </= 29% limitation on FOTO    Time 6    Period Weeks    Status Achieved             PT Long Term Goals - 11/12/19 1552      PT LONG TERM GOAL #1  Title STG=LTG                 Plan - 03/03/20 1614    Clinical Impression Statement Patient reports continued upper back and neck tightness. She reports the past couple of weeks have been difficulty due to having to  hold her daughter more and work which contributes to poor posture and tightness. Overall, she has improved since baseline and is independent with her HEP. She has met almost all goals but does continue to report tightness with longer durations of sitting at work. Patient will be discharged from PT because she is doing well and will be able to use current HEP to progress and she was provided information for outside resource to continue dry needling treatment as needed.    PT Treatment/Interventions --    PT Next Visit Plan NA - discharge    PT Home Exercise Plan W_0 [id number]: SCM/scalene stretches and 1st rib mobs, chin tuck, row with blue, extension with scap retraction with green, double ER with scap retraction with green, horiz abduction with red, upper trap and levator stretch, pec stretch supine on foam roller, suboccipital release with peanut, thoracic extension step-back stretch, quadruped thoracic rotation    Consulted and Agree with Plan of Care Patient           Patient will benefit from skilled therapeutic intervention in order to improve the following deficits and impairments:  Postural dysfunction, Decreased strength, Decreased range of motion, Impaired flexibility, Pain, Decreased activity tolerance  Visit Diagnosis: Cervicalgia  Chronic left shoulder pain  Chronic right shoulder pain  Abnormal posture  Muscle weakness (generalized)     Problem List Patient Active Problem List   Diagnosis Date Noted  . Anxiety and depression 05/31/2018  . Rh negative, maternal 05/31/2018  . Frank breech presentation 05/29/2018  . Status post repeat low transverse cesarean section 05/29/2018  . Postpartum care following cesarean delivery (10/24) 05/29/2018    PHYSICAL THERAPY DISCHARGE SUMMARY  Visits from Start of Care: 11  Current functional level related to goals / functional outcomes: See above   Remaining deficits: See above   Education / Equipment: HEP Plan: Patient  agrees to discharge.  Patient goals were partially met. Patient is being discharged due to being pleased with the current functional level.  ?????     Hilda Blades, PT, DPT, LAT, ATC 03/03/20  4:54 PM Phone: [phone] Fax: [facility], [place] [address] [place], [place], [address] Phone: [phone]   Fax:  [phone]  Name: UMI MAINOR MRN: [id number] Date of Birth: Jul 27, 1981

## 2020-05-23 ENCOUNTER — Encounter (HOSPITAL_COMMUNITY): Payer: Self-pay | Admitting: Obstetrics

## 2020-05-23 ENCOUNTER — Inpatient Hospital Stay (HOSPITAL_COMMUNITY)
Admission: AD | Admit: 2020-05-23 | Discharge: 2020-05-23 | Disposition: A | Discharge status: Home / Self Care | Payer: Managed Care, Other (non HMO) | Attending: Obstetrics | Admitting: Obstetrics

## 2020-05-23 ENCOUNTER — Other Ambulatory Visit: Payer: Self-pay

## 2020-05-23 ENCOUNTER — Other Ambulatory Visit: Payer: Self-pay | Admitting: Obstetrics

## 2020-05-23 DIAGNOSIS — O00101 Right tubal pregnancy without intrauterine pregnancy: Secondary | ICD-10-CM

## 2020-05-23 DIAGNOSIS — O009 Unspecified ectopic pregnancy without intrauterine pregnancy: Secondary | ICD-10-CM | POA: Insufficient documentation

## 2020-05-23 LAB — COMPREHENSIVE METABOLIC PANEL
ALT: 16 U/L (ref 0–44)
AST: 15 U/L (ref 15–41)
Albumin: 4 g/dL (ref 3.5–5.0)
Alkaline Phosphatase: 47 U/L (ref 38–126)
Anion gap: 9 (ref 5–15)
BUN: 9 mg/dL (ref 6–20)
CO2: 24 mmol/L (ref 22–32)
Calcium: 9.5 mg/dL (ref 8.9–10.3)
Chloride: 102 mmol/L (ref 98–111)
Creatinine, Ser: 0.75 mg/dL (ref 0.44–1.00)
GFR, Estimated: >60 mL/min (ref >60)
Glucose, Bld: 131 mg/dL — ABNORMAL HIGH (ref 70–99)
Potassium: 3.6 mmol/L (ref 3.5–5.1)
Sodium: 135 mmol/L (ref 135–145)
Total Bilirubin: 0.4 mg/dL (ref 0.3–1.2)
Total Protein: 6.9 g/dL (ref 6.5–8.1)

## 2020-05-23 LAB — CBC WITH DIFFERENTIAL/PLATELET
Abs Immature Granulocytes: 0.03 10*3/uL (ref 0.00–0.07)
Basophils Absolute: 0 10*3/uL (ref 0.0–0.1)
Basophils Relative: 0 %
Eosinophils Absolute: 0.1 10*3/uL (ref 0.0–0.5)
Eosinophils Relative: 0 %
HCT: 39 % (ref 36.0–46.0)
Hemoglobin: 12.8 g/dL (ref 12.0–15.0)
Immature Granulocytes: 0 %
Lymphocytes Relative: 20 %
Lymphs Abs: 2.2 10*3/uL (ref 0.7–4.0)
MCH: 30.8 pg (ref 26.0–34.0)
MCHC: 32.8 g/dL (ref 30.0–36.0)
MCV: 94 fL (ref 80.0–100.0)
Monocytes Absolute: 0.5 10*3/uL (ref 0.1–1.0)
Monocytes Relative: 4 %
Neutro Abs: 8.4 10*3/uL — ABNORMAL HIGH (ref 1.7–7.7)
Neutrophils Relative %: 76 %
Platelets: 320 10*3/uL (ref 150–400)
RBC: 4.15 MIL/uL (ref 3.87–5.11)
RDW: 12.7 % (ref 11.5–15.5)
WBC: 11.2 10*3/uL — ABNORMAL HIGH (ref 4.0–10.5)
nRBC: 0 % (ref 0.0–0.2)

## 2020-05-23 LAB — HCG, QUANTITATIVE, PREGNANCY: hCG, Beta Chain, Quant, S: 8629 m[IU]/mL — ABNORMAL HIGH (ref <5)

## 2020-05-23 MED ORDER — RHO D IMMUNE GLOBULIN 1500 UNIT/2ML IJ SOSY
300.0000 ug | PREFILLED_SYRINGE | Freq: Once | INTRAMUSCULAR | Status: DC
Start: 2020-05-23 — End: 2020-05-23
  Filled 2020-05-23: qty 2

## 2020-05-23 MED ORDER — METHOTREXATE SODIUM CHEMO INJECTION 50 MG/2ML
50.0000 mg/m2 | Freq: Once | INTRAMUSCULAR | Status: AC
  Administered 2020-05-23: 85 mg via INTRAMUSCULAR
  Filled 2020-05-23: qty 3.4

## 2020-05-23 MED ORDER — METHOTREXATE FOR ECTOPIC PREGNANCY: 50.0000 mg/m2 | Freq: Once | INTRAMUSCULAR | Status: DC

## 2020-05-23 NOTE — MAU Note (Signed)
Lab results called to Dr Conni Elliot, orders received

## 2020-05-23 NOTE — Discharge Instructions (Signed)
Ectopic Pregnancy  An ectopic pregnancy happens when a fertilized egg grows outside the womb (uterus). The fertilized egg cannot stay alive outside of the womb. This problem often happens in a fallopian tube. It is often caused by damage to the tube. If this problem is found early, you may be treated with medicine that stops the egg from growing. If your tube tears or bursts open (ruptures), you will bleed inside. Often, there is very bad pain in the lower belly. This is an emergency. You will need surgery. Get help right away. Follow these instructions at home: After being treated with medicine or surgery:  Rest and limit your activity for as long as told by your doctor.  Until your doctor says that it is safe: ? Do not lift anything that is heavier than 10 lb (4.5 kg) or the limit that your doctor tells you. ? Avoid exercise and any movement that takes a lot of effort.  To prevent problems when pooping (constipation): ? Eat a healthy diet. This includes:  Fruits.  Vegetables.  Whole grains. ? Drink 6-8 glasses of water a day. Contact a doctor if: Get help right away if:  You have sudden and very bad pain in your belly.  You have very bad pain in your shoulders or neck.  You have pain that gets worse and is not helped by medicine.  You have: ? A fever or chills. ? Vaginal bleeding. ? Redness or swelling at the site of a surgical cut (incision).  You feel sick to your stomach (nauseous) or you throw up (vomit).  You feel dizzy or weak.  You feel light-headed or you pass out (faint). Summary  An ectopic pregnancy happens when a fertilized egg grows outside the womb (uterus).  If this problem is found early, you may be treated with medicine that stops the egg from growing.  If your tube tears or bursts open (ruptures), you will need surgery. This is an emergency. Get help right away. This information is not intended to replace advice given to you by your health care  provider. Make sure you discuss any questions you have with your health care provider. Document Revised: 07/05/2017 Document Reviewed: 08/16/2016 Elsevier Patient Education  2020 Elsevier Inc[employer]   Methotrexate Treatment for an Ectopic Pregnancy, Care After This sheet gives you information about how to care for yourself after your procedure. Your health care provider may also give you more specific instructions. If you have problems or questions, contact your health care provider. What can I expect after the procedure? After the procedure, it is common to have:  Abdominal cramping.  Vaginal bleeding.  Fatigue.  Nausea.  Vomiting.  Diarrhea. Blood tests will be taken at timed intervals for several days or weeks to check your pregnancy hormone levels. The blood tests will be done until the pregnancy hormone can no longer be detected in the blood. Follow these instructions at home: Activity  Do not have sex until your health care provider approves.  Limit activities that take a lot of effort as told by your health care provider. Medicines  Take over the counter and prescription medicines only as told by your health care provider.  Do not take aspirin, ibuprofen, naproxen, or any other NSAIDs.  Do not take folic acid, prenatal vitamins, or other vitamins that contain folic acid. General instructions   Do not drink alcohol.  Follow instructions from your health care provider on how and when to report any symptoms that may indicate   a ruptured ectopic pregnancy.  Keep all follow-up visits as told by your health care provider. This is important. Contact a health care provider if:  You have persistent nausea and vomiting.  You have persistent diarrhea.  You are having a reaction to the medicine, such as: ? Tiredness. ? Skin rash. ? Hair loss. Get help right away if:  Your abdominal or pelvic pain gets worse.  You have more vaginal bleeding.  You feel light-headed or  you faint.  You have shortness of breath.  Your heart rate increases.  You develop a cough.  You have chills.  You have a fever. Summary  After the procedure, it is common to have symptoms of abdominal cramping, vaginal bleeding and fatigue. You may also experience other symptoms.  Blood tests will be taken at timed intervals for several days or weeks to check your pregnancy hormone levels. The blood tests will be done until the pregnancy hormone can no longer be detected in the blood.  Limit strenuous activity as told by your health care provider.  Follow instructions from your health care provider on how and when to report any symptoms that may indicate a ruptured ectopic pregnancy. This information is not intended to replace advice given to you by your health care provider. Make sure you discuss any questions you have with your health care provider. Document Revised: 07/05/2017 Document Reviewed: 09/11/2016 Elsevier Patient Education  2020 Elsevier Inc.   Ruptured Ectopic Pregnancy  An ectopic pregnancy is when a fertilized egg attaches (implants) outside of the uterus, usually in a fallopian tube. A ruptured ectopic pregnancy is when the fallopian tube tears or bursts. This results in internal bleeding, intense abdominal pain, and sometimes, vaginal bleeding. Most ectopic pregnancies occur in the fallopian tube. In rare cases, it may occur on the ovary, intestine, pelvis, or cervix. An ectopic pregnancy does not have the ability to develop into a normal, healthy baby. A ruptured ectopic pregnancy can affect your ability to have children (fertility), depending on damage it causes to your reproductive organs. Ruptured ectopic pregnancy is a medical emergency. If not treated immediately, it can lead to blood loss, shock, or even death. What are the causes? Most ectopic pregnancies are caused by damage to the fallopian tubes. The damage prevents the fertilized egg from implanting in  the uterus. In some cases, the cause may not be known. What increases the risk? You are at increased risk for an ectopic pregnancy if:  You have had a previous ectopic pregnancy.  You have had previous fallopian tube surgery.  You have had previous surgery to have the fallopian tubes tied (tubal ligation).  You have had infertility treatments or have a history of infertility.  You have been exposed to DES. DES is a medicine that was used until 1971 and had effects on babies whose mothers took the medicine.  You use an IUD (intrauterine device) for birth control.  You use progestin-only oral contraception for birth control.  You have a history of pelvic inflammatory disease (PID).  You have a history of endometriosis.  You smoke.  You became sexually active before 39 years of age.  You have multiple sexual partners. What are the signs or symptoms? Symptoms of a ruptured ectopic pregnancy and internal bleeding may include:  Sudden, severe pain in the abdomen and pelvis.  Dizziness or fainting.  Pain in the shoulder area.  Vaginal bleeding. How is this diagnosed? This condition is diagnosed based on your medical history, symptoms, a  physical exam, and tests, which may include:  A pregnancy test.  An ultrasound.  Measuring the levels of the pregnancy hormone in the bloodstream.  Taking a sample of tissue from the uterus (dilation and curettage, D&C).  Surgery to visually examine the inside of the abdomen using a lighted tube (laparoscopy). How is this treated? This condition is treated with IV fluids and emergency surgery to remove the ectopic pregnancy and repair the area where the rupture occured. If you have lost a lot of blood, you may need a blood transfusion. If you are Rh negative and your baby's father is Rh positive, or the Rh type of the father is unknown, you may receive a Rho (D) immune globulin shot. This is to prevent Rh problems in future  pregnancies. Additional medicines may be given. Get help right away if:  You are taking medicines to treat an ectopic pregnancy and you develop symptoms of a rupture. These include: ? Fever or chills. ? Shoulder pain. ? Vaginal bleeding. ? Nausea and vomiting. ? Severe abdominal pain or cramping. ? Feeling light-headed or fainting. Summary  An ectopic pregnancy is when a fertilized egg attaches (implants) outside of the uterus, usually in a fallopian tube. A ruptured ectopic pregnancy is when the fallopian tube tears or bursts.  Ruptured ectopic pregnancy is a medical emergency. If not treated immediately, it can lead to blood loss, shock, or even death.  This condition is treated with IV fluids and emergency surgery to remove the ectopic pregnancy and repair the area where the rupture occured. If you have lost a lot of blood, you may need a blood transfusion. This information is not intended to replace advice given to you by your health care provider. Make sure you discuss any questions you have with your health care provider. Document Revised: 07/05/2017 Document Reviewed: 10/10/2016 Elsevier Patient Education  2020 [employer].

## 2020-05-23 NOTE — MAU Provider Note (Signed)
None     S Ms. GRIFFIN DEWILDE is a 39 y.o. [phone] patient who presents to MAU today with complaint of ectopic pregnancy. She was seen by her OB in the office and diagnosed with ectopic. She's here for MTX. Denies vaginal bleeding, pelvic pain.   O BP 111/73   Pulse (!) 56   Temp 98.2 F (36.8 C)   Resp 16   Ht 5\' 3"  (1.6 m)   Wt 65.8 kg   LMP 04/03/2020   BMI 25.69 kg/m  Physical Exam Vitals reviewed.  Constitutional:      Appearance: Normal appearance.  Cardiovascular:     Rate and Rhythm: Normal rate.     Pulses: Normal pulses.  Pulmonary:     Effort: Pulmonary effort is normal.  Abdominal:     General: Abdomen is flat.     Palpations: Abdomen is soft.  Skin:    Capillary Refill: Capillary refill takes less than 2 seconds.  Neurological:     Mental Status: She is alert.  Psychiatric:        Mood and Affect: Mood normal.        Behavior: Behavior normal.        Thought Content: Thought content normal.        Judgment: Judgment normal.     A Medical screening exam complete Ectopic Pregnancy.  P Stable Awaiting orders from Primary OB. 04/05/2020, DO 05/23/2020 1:58 PM

## 2020-05-23 NOTE — MAU Note (Signed)
. °  Adrienne Williams is a 39 y.o. at [redacted]w[redacted]d here in MAU reporting: she was sent over for ectopic pregnancy for MTX. Denies any pain or VB LMP: 04/03/20 Onset of complaint: today Pain score: 0 Vitals:   05/23/20 1335  BP: 111/73  Pulse: (!) 56  Resp: 16  Temp: 98.2 F (36.8 C)     FHT: Lab orders placed from triage:

## 2020-05-29 ENCOUNTER — Encounter (HOSPITAL_COMMUNITY): Payer: Self-pay | Admitting: Obstetrics & Gynecology

## 2020-05-29 ENCOUNTER — Other Ambulatory Visit: Payer: Self-pay

## 2020-05-29 ENCOUNTER — Inpatient Hospital Stay (HOSPITAL_COMMUNITY): Payer: Managed Care, Other (non HMO) | Admitting: Anesthesiology

## 2020-05-29 ENCOUNTER — Encounter (HOSPITAL_COMMUNITY)
Admission: AD | Disposition: A | Discharge status: Home / Self Care | Payer: Self-pay | Source: Home / Self Care | Attending: Obstetrics & Gynecology

## 2020-05-29 ENCOUNTER — Ambulatory Visit (HOSPITAL_COMMUNITY)
Admission: AD | Admit: 2020-05-29 | Discharge: 2020-05-30 | Disposition: A | Discharge status: Home / Self Care | Payer: Managed Care, Other (non HMO) | Attending: Obstetrics & Gynecology | Admitting: Obstetrics & Gynecology

## 2020-05-29 ENCOUNTER — Other Ambulatory Visit (HOSPITAL_COMMUNITY)
Admission: RE | Admit: 2020-05-29 | Discharge: 2020-05-29 | Disposition: A | Discharge status: Home / Self Care | Payer: Managed Care, Other (non HMO) | Source: Ambulatory Visit | Attending: Obstetrics | Admitting: Obstetrics

## 2020-05-29 ENCOUNTER — Inpatient Hospital Stay (HOSPITAL_COMMUNITY)
Admission: AD | Admit: 2020-05-29 | Discharge: 2020-05-29 | Disposition: A | Discharge status: Still In Hospital | Payer: Managed Care, Other (non HMO) | Source: Ambulatory Visit | Attending: Obstetrics | Admitting: Obstetrics

## 2020-05-29 ENCOUNTER — Inpatient Hospital Stay (HOSPITAL_COMMUNITY): Payer: Managed Care, Other (non HMO)

## 2020-05-29 ENCOUNTER — Other Ambulatory Visit: Payer: Self-pay | Admitting: Obstetrics

## 2020-05-29 DIAGNOSIS — Z79899 Other long term (current) drug therapy: Secondary | ICD-10-CM | POA: Diagnosis not present

## 2020-05-29 DIAGNOSIS — F32A Depression, unspecified: Secondary | ICD-10-CM | POA: Diagnosis not present

## 2020-05-29 DIAGNOSIS — O99340 Other mental disorders complicating pregnancy, unspecified trimester: Secondary | ICD-10-CM | POA: Diagnosis not present

## 2020-05-29 DIAGNOSIS — Z20822 Contact with and (suspected) exposure to covid-19: Secondary | ICD-10-CM | POA: Diagnosis not present

## 2020-05-29 DIAGNOSIS — O00101 Right tubal pregnancy without intrauterine pregnancy: Secondary | ICD-10-CM | POA: Insufficient documentation

## 2020-05-29 DIAGNOSIS — Z3490 Encounter for supervision of normal pregnancy, unspecified, unspecified trimester: Secondary | ICD-10-CM | POA: Insufficient documentation

## 2020-05-29 DIAGNOSIS — R109 Unspecified abdominal pain: Secondary | ICD-10-CM

## 2020-05-29 DIAGNOSIS — Z7951 Long term (current) use of inhaled steroids: Secondary | ICD-10-CM | POA: Diagnosis not present

## 2020-05-29 DIAGNOSIS — J45909 Unspecified asthma, uncomplicated: Secondary | ICD-10-CM | POA: Insufficient documentation

## 2020-05-29 DIAGNOSIS — Z888 Allergy status to other drugs, medicaments and biological substances status: Secondary | ICD-10-CM | POA: Insufficient documentation

## 2020-05-29 DIAGNOSIS — O99519 Diseases of the respiratory system complicating pregnancy, unspecified trimester: Secondary | ICD-10-CM | POA: Insufficient documentation

## 2020-05-29 DIAGNOSIS — Z3A00 Weeks of gestation of pregnancy not specified: Secondary | ICD-10-CM | POA: Insufficient documentation

## 2020-05-29 DIAGNOSIS — O00109 Unspecified tubal pregnancy without intrauterine pregnancy: Secondary | ICD-10-CM

## 2020-05-29 DIAGNOSIS — O26899 Other specified pregnancy related conditions, unspecified trimester: Secondary | ICD-10-CM

## 2020-05-29 HISTORY — PX: UNILATERAL SALPINGECTOMY: SHX6160

## 2020-05-29 HISTORY — PX: DIAGNOSTIC LAPAROSCOPY WITH REMOVAL OF ECTOPIC PREGNANCY: SHX6449

## 2020-05-29 LAB — RESPIRATORY PANEL BY RT PCR (FLU A&B, COVID)
Influenza A by PCR: NEGATIVE
Influenza B by PCR: NEGATIVE
SARS Coronavirus 2 by RT PCR: NEGATIVE

## 2020-05-29 LAB — CBC
HCT: 39.6 % (ref 36.0–46.0)
Hemoglobin: 13.3 g/dL (ref 12.0–15.0)
MCH: 31.6 pg (ref 26.0–34.0)
MCHC: 33.6 g/dL (ref 30.0–36.0)
MCV: 94.1 fL (ref 80.0–100.0)
Platelets: 339 10*3/uL (ref 150–400)
RBC: 4.21 MIL/uL (ref 3.87–5.11)
RDW: 12.8 % (ref 11.5–15.5)
WBC: 12.7 10*3/uL — ABNORMAL HIGH (ref 4.0–10.5)
nRBC: 0 % (ref 0.0–0.2)

## 2020-05-29 LAB — HCG, QUANTITATIVE, PREGNANCY: hCG, Beta Chain, Quant, S: 17937 m[IU]/mL — ABNORMAL HIGH (ref <5)

## 2020-05-29 IMAGING — US US OB < 14 WEEKS - US OB TV
1 series · 15 of 28 positions shown · non-contrast
Comparison: None.

CLINICAL DATA: Known RIGHT ectopic pregnancy, started on
methotrexate on [REDACTED] with elevated beta HCG levels.

EXAM:
OBSTETRIC <14 WK US AND TRANSVAGINAL OB US
TECHNIQUE: Both transabdominal and transvaginal ultrasound examinations were
performed for complete evaluation of the gestation as well as the
maternal uterus, adnexal regions, and pelvic cul-de-sac.
Transvaginal technique was performed to assess early pregnancy.

[Series 1: us ob < 14 weeks - us ob tv · 39 acquisitions, 15 frames shown]
[im 1/39]
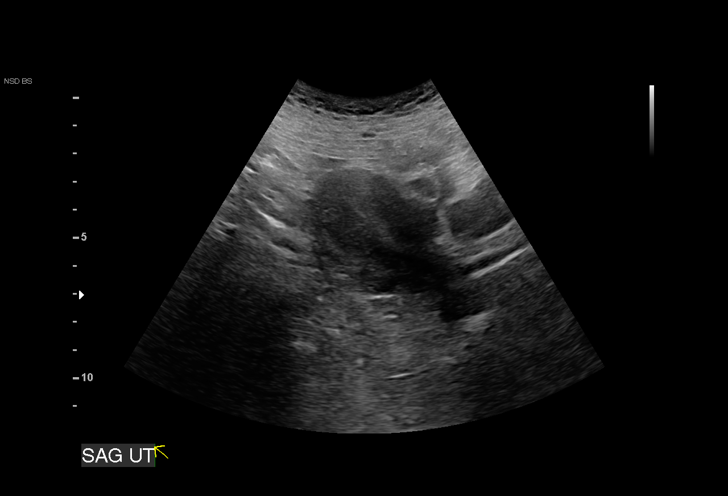
[im 3/39]
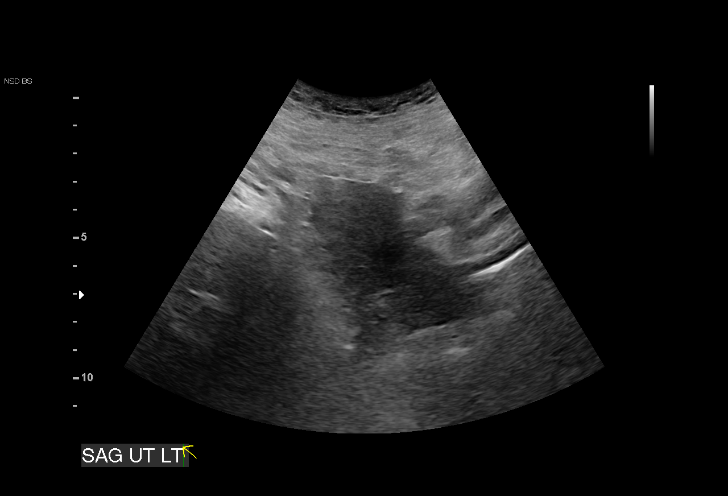
[im 6/39]
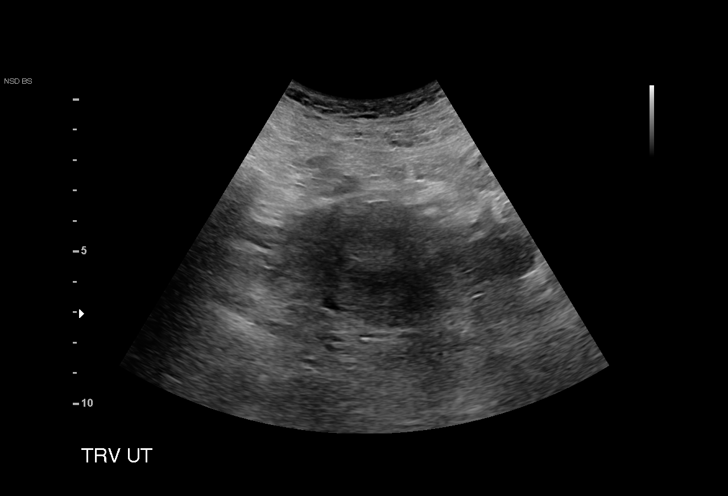
[im 9/39]
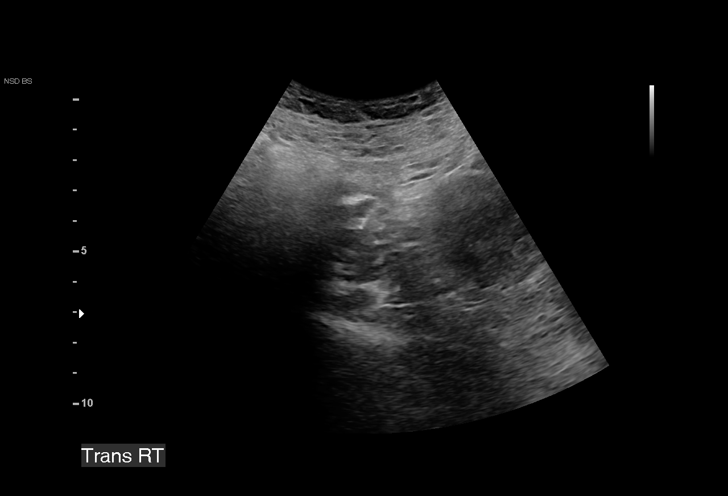
[im 12/39]
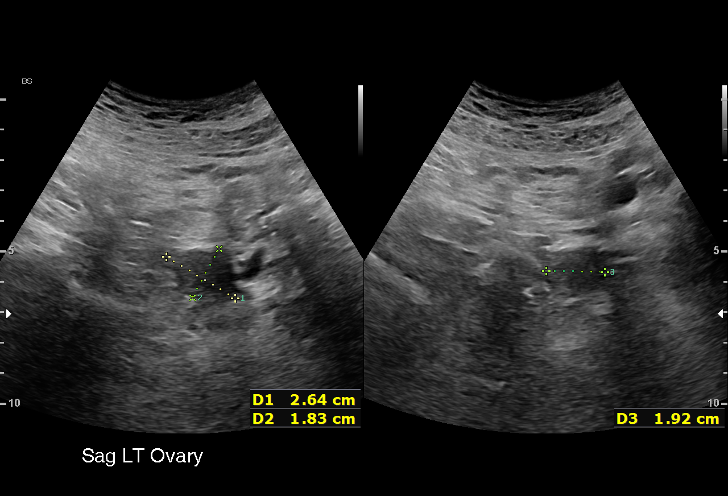
[im 15/39]
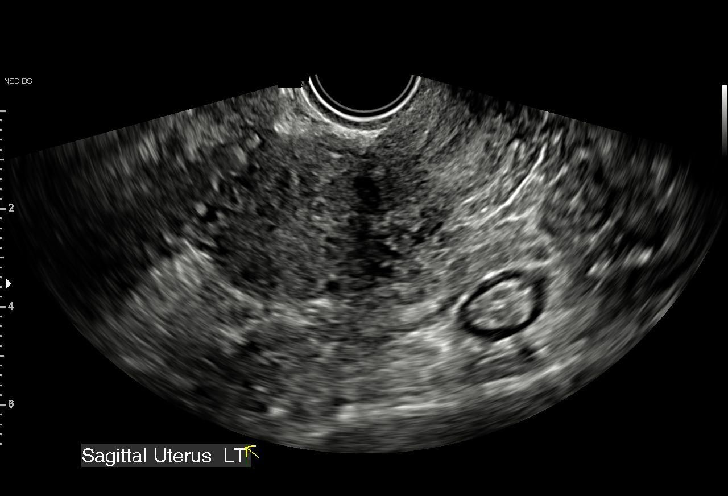
[im 17/39]
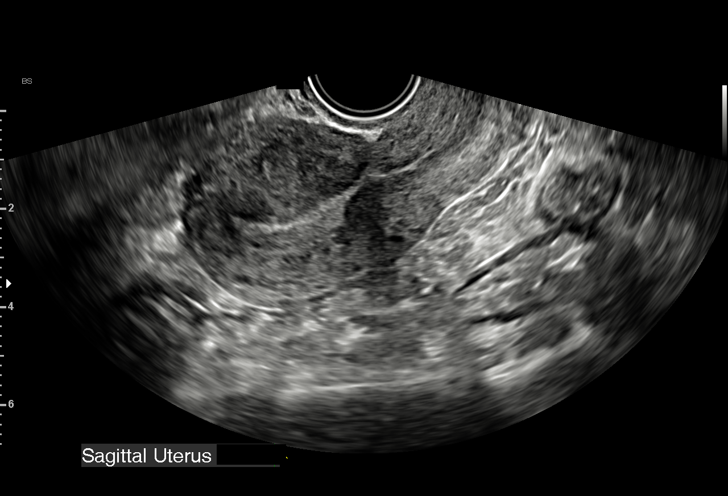
[im 20/39]
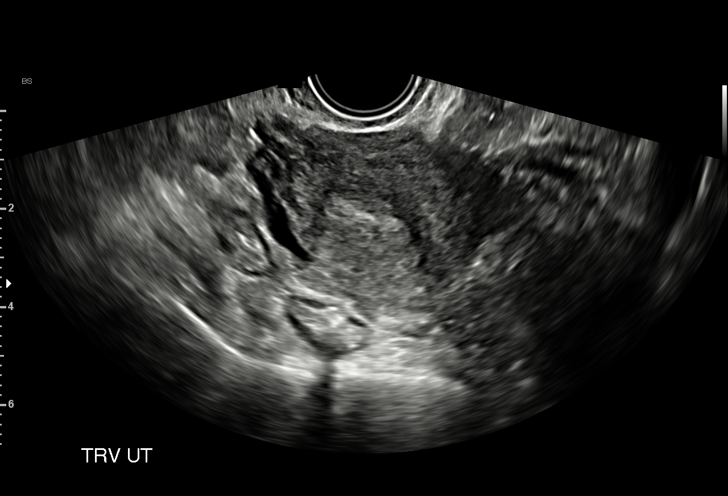
[im 22/39]
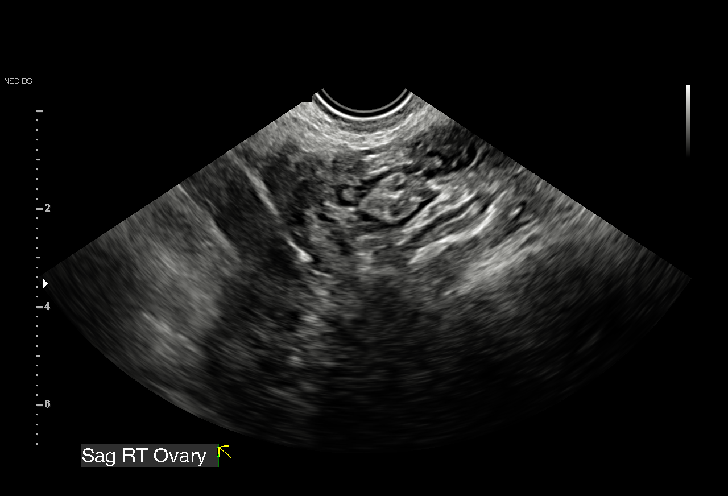
[im 24/39]
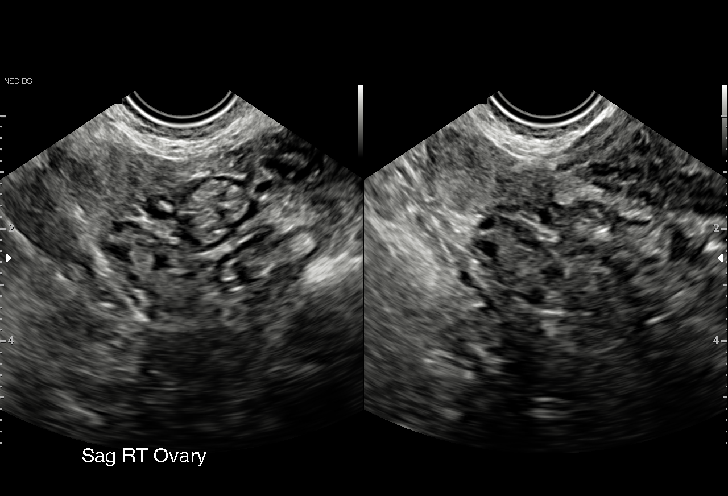
[im 27/39]
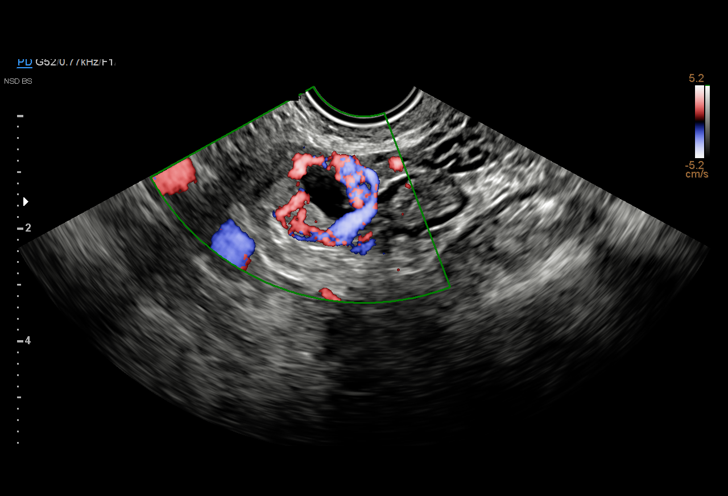
[im 30/39]
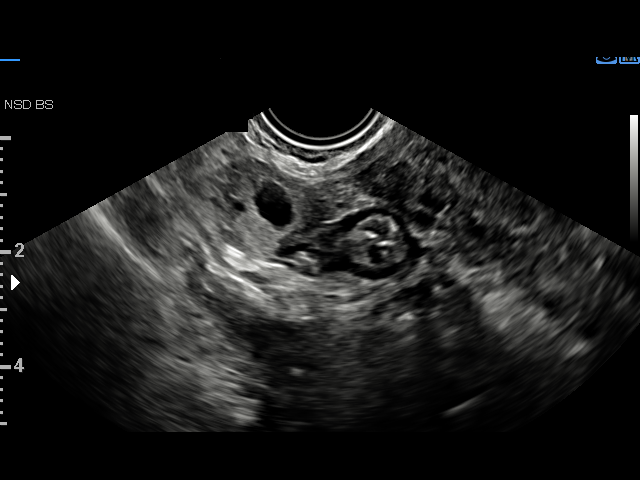
[im 33/39]
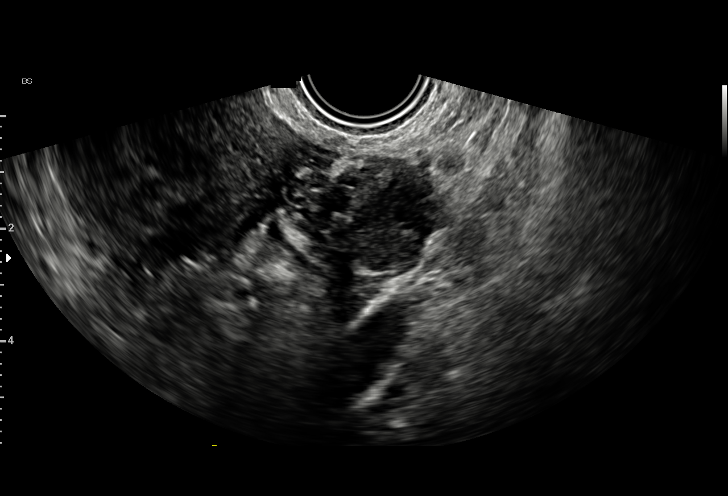
[im 36/39]
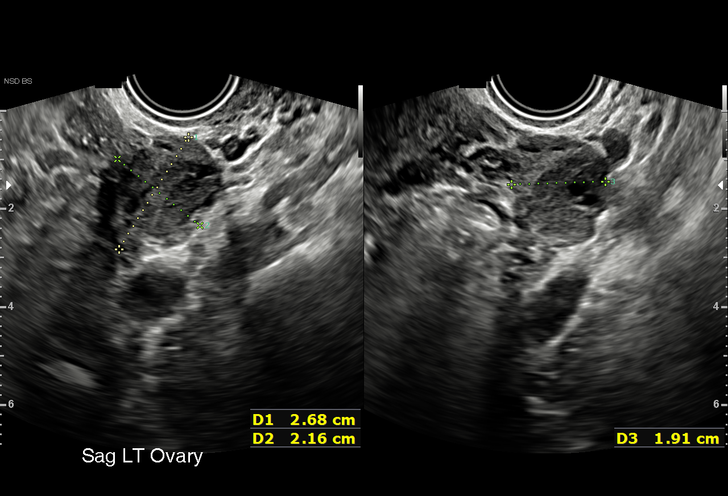
[im 39/39]
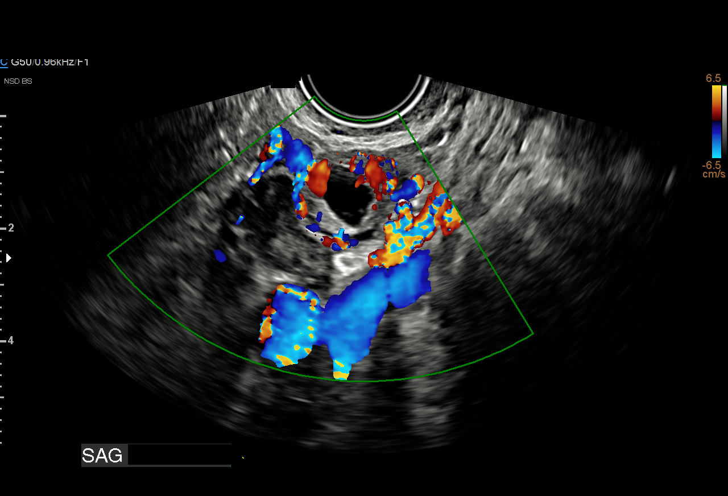

[15 of 28 positions shown; findings below may reference images not displayed]

FINDINGS: Intrauterine gestational sac: None

Yolk sac:  Not Visualized.

Embryo:  Not Visualized.

Cardiac Activity: Not Visualized.

Heart Rate: N/A  bpm

Subchorionic hemorrhage:  None visualized.

Maternal uterus/adnexae:

The right ovary measures 2.1 cm x 1.2 cm x 1.1 cm and is normal in
appearance.

A 2.1 cm x 1.8 cm x 1.7 cm thick walled hyperechoic structure, with
1.2 cm x 0.7 cm x 0.6 cm central anechoic area, is seen within the
right adnexa. Marked severity peripheral flow is seen within this
region on color Doppler.

The left ovary measures 2.7 cm x 2.2 cm x 1.9 cm and is normal in
appearance.

No pelvic free fluid is seen.
IMPRESSION: Findings consistent with the patient's known RIGHT ectopic
pregnancy, without evidence of rupture at the time of the exam.

## 2020-05-29 SURGERY — LAPAROSCOPY, WITH ECTOPIC PREGNANCY SURGICAL TREATMENT
Anesthesia: General | Laterality: Right

## 2020-05-29 MED ORDER — BUPIVACAINE HCL (PF) 0.25 % IJ SOLN
INTRAMUSCULAR | Status: AC
  Filled 2020-05-29: qty 30

## 2020-05-29 MED ORDER — PROPOFOL 10 MG/ML IV BOLUS
INTRAVENOUS | Status: AC
  Filled 2020-05-29: qty 20

## 2020-05-29 MED ORDER — LACTATED RINGERS IV SOLN: INTRAVENOUS | Status: DC | PRN

## 2020-05-29 MED ORDER — LIDOCAINE 2% (20 MG/ML) 5 ML SYRINGE
INTRAMUSCULAR | Status: AC
  Filled 2020-05-29: qty 10

## 2020-05-29 MED ORDER — SODIUM CHLORIDE 0.9 % IR SOLN
Status: DC | PRN
  Administered 2020-05-29: 3000 mL

## 2020-05-29 MED ORDER — ONDANSETRON HCL 4 MG/2ML IJ SOLN
INTRAMUSCULAR | Status: DC | PRN
  Administered 2020-05-29: 4 mg via INTRAVENOUS

## 2020-05-29 MED ORDER — CEFAZOLIN SODIUM 1 G IJ SOLR
INTRAMUSCULAR | Status: AC
  Filled 2020-05-29: qty 20

## 2020-05-29 MED ORDER — BUPIVACAINE HCL (PF) 0.25 % IJ SOLN
INTRAMUSCULAR | Status: DC | PRN
  Administered 2020-05-29: 30 mL

## 2020-05-29 MED ORDER — SUCCINYLCHOLINE CHLORIDE 200 MG/10ML IV SOSY
PREFILLED_SYRINGE | INTRAVENOUS | Status: AC
  Filled 2020-05-29: qty 20

## 2020-05-29 MED ORDER — LIDOCAINE HCL (CARDIAC) PF 100 MG/5ML IV SOSY
PREFILLED_SYRINGE | INTRAVENOUS | Status: DC | PRN
  Administered 2020-05-29: 60 mg via INTRATRACHEAL

## 2020-05-29 MED ORDER — FENTANYL CITRATE (PF) 250 MCG/5ML IJ SOLN
INTRAMUSCULAR | Status: DC | PRN
Start: 2020-05-29 — End: 2020-05-30
  Administered 2020-05-29: 100 ug via INTRAVENOUS
  Administered 2020-05-29: 50 ug via INTRAVENOUS
  Administered 2020-05-29: 100 ug via INTRAVENOUS

## 2020-05-29 MED ORDER — SUCCINYLCHOLINE CHLORIDE 20 MG/ML IJ SOLN
INTRAMUSCULAR | Status: DC | PRN
  Administered 2020-05-29: 100 mg via INTRAVENOUS

## 2020-05-29 MED ORDER — MIDAZOLAM HCL 2 MG/2ML IJ SOLN
INTRAMUSCULAR | Status: DC | PRN
  Administered 2020-05-29: 2 mg via INTRAVENOUS

## 2020-05-29 MED ORDER — ONDANSETRON HCL 4 MG/2ML IJ SOLN
INTRAMUSCULAR | Status: AC
  Filled 2020-05-29: qty 4

## 2020-05-29 MED ORDER — FENTANYL CITRATE (PF) 250 MCG/5ML IJ SOLN
INTRAMUSCULAR | Status: AC
  Filled 2020-05-29: qty 5

## 2020-05-29 MED ORDER — ROCURONIUM BROMIDE 10 MG/ML (PF) SYRINGE
PREFILLED_SYRINGE | INTRAVENOUS | Status: AC
  Filled 2020-05-29: qty 20

## 2020-05-29 MED ORDER — CEFAZOLIN SODIUM-DEXTROSE 2-3 GM-%(50ML) IV SOLR
INTRAVENOUS | Status: DC | PRN
  Administered 2020-05-29: 2 g via INTRAVENOUS

## 2020-05-29 MED ORDER — ROCURONIUM BROMIDE 100 MG/10ML IV SOLN
INTRAVENOUS | Status: DC | PRN
  Administered 2020-05-29: 40 mg via INTRAVENOUS

## 2020-05-29 MED ORDER — MIDAZOLAM HCL 2 MG/2ML IJ SOLN
INTRAMUSCULAR | Status: AC
  Filled 2020-05-29: qty 2

## 2020-05-29 MED ORDER — POVIDONE-IODINE 10 % EX SWAB: 2.0000 "application " | Freq: Once | CUTANEOUS | Status: DC

## 2020-05-29 MED ORDER — PROPOFOL 10 MG/ML IV BOLUS
INTRAVENOUS | Status: DC | PRN
  Administered 2020-05-29: 170 mg via INTRAVENOUS

## 2020-05-29 MED ORDER — CEFAZOLIN SODIUM-DEXTROSE 2-4 GM/100ML-% IV SOLN: 2.0000 g | INTRAVENOUS | Status: DC

## 2020-05-29 SURGICAL SUPPLY — 34 items
ADH SKN CLS APL DERMABOND .7 (GAUZE/BANDAGES/DRESSINGS) ×1
BAG SPEC RTRVL LRG 6X4 10 (ENDOMECHANICALS) ×1
CABLE HIGH FREQUENCY MONO STRZ (ELECTRODE) IMPLANT
CATH ROBINSON RED A/P 16FR (CATHETERS) ×3 IMPLANT
DERMABOND ADVANCED (GAUZE/BANDAGES/DRESSINGS) ×2
DERMABOND ADVANCED .7 DNX12 (GAUZE/BANDAGES/DRESSINGS) ×1 IMPLANT
DRSG OPSITE POSTOP 3X4 (GAUZE/BANDAGES/DRESSINGS) ×2 IMPLANT
DURAPREP 26ML APPLICATOR (WOUND CARE) ×3 IMPLANT
FILTER SMOKE EVAC LAPAROSHD (FILTER) ×2 IMPLANT
GLOVE BIO SURGEON STRL SZ7 (GLOVE) ×3 IMPLANT
GLOVE BIOGEL PI IND STRL 7.0 (GLOVE) ×2 IMPLANT
GLOVE BIOGEL PI INDICATOR 7.0 (GLOVE) ×4
GOWN STRL REUS W/ TWL LRG LVL3 (GOWN DISPOSABLE) ×3 IMPLANT
GOWN STRL REUS W/TWL LRG LVL3 (GOWN DISPOSABLE) ×9
KIT TURNOVER KIT B (KITS) ×3 IMPLANT
LIGASURE VESSEL 5MM BLUNT TIP (ELECTROSURGICAL) ×2 IMPLANT
MANIPULATOR UTERINE 4.5 ZUMI (MISCELLANEOUS) ×3 IMPLANT
NS IRRIG 1000ML POUR BTL (IV SOLUTION) ×3 IMPLANT
PACK LAPAROSCOPY BASIN (CUSTOM PROCEDURE TRAY) ×3 IMPLANT
PACK TRENDGUARD 450 HYBRID PRO (MISCELLANEOUS) IMPLANT
POUCH SPECIMEN RETRIEVAL 10MM (ENDOMECHANICALS) ×2 IMPLANT
PROTECTOR NERVE ULNAR (MISCELLANEOUS) ×6 IMPLANT
SCISSORS LAP 5X35 DISP (ENDOMECHANICALS) IMPLANT
SET IRRIG TUBING LAPAROSCOPIC (IRRIGATION / IRRIGATOR) ×2 IMPLANT
SET TUBE SMOKE EVAC HIGH FLOW (TUBING) ×3 IMPLANT
SLEEVE ENDOPATH XCEL 5M (ENDOMECHANICALS) ×3 IMPLANT
SOLUTION ELECTROLUBE (MISCELLANEOUS) IMPLANT
SUT VICRYL 0 UR6 27IN ABS (SUTURE) ×3 IMPLANT
SUT VICRYL 4-0 PS2 18IN ABS (SUTURE) ×3 IMPLANT
TOWEL GREEN STERILE FF (TOWEL DISPOSABLE) ×6 IMPLANT
TRENDGUARD 450 HYBRID PRO PACK (MISCELLANEOUS) ×3
TROCAR BALLN 12MMX100 BLUNT (TROCAR) IMPLANT
TROCAR XCEL NON-BLD 5MMX100MML (ENDOMECHANICALS) ×3 IMPLANT
WARMER LAPAROSCOPE (MISCELLANEOUS) ×3 IMPLANT

## 2020-05-29 NOTE — H&P (Signed)
Adrienne Williams is an 39 y.o. female G3P2002, at 8 wks, with known right adnexal ectopic pregnancy, Methotrexate 7 days back, was here for Day 7 bHCG. Denies pain and bleeding or dizziness. Her bHCG was in 8000 range on D1 and 9600 on D4 in office and now 17,000 c/w failed Methotrexate.  Prior C/s x 2.   Patient's last menstrual period was 04/03/2020.    Past Medical History:  Diagnosis Date  . Anxiety   . Asthma   . Depression   . Frank breech presentation 05/29/2018  . Hemorrhoids   . Hx of varicella   . PONV (postoperative nausea and vomiting)    "little nauseated"  . Postpartum care following cesarean delivery (6/14) 01/18/2016    Past Surgical History:  Procedure Laterality Date  . CESAREAN SECTION N/A 01/18/2016   Procedure: Primary CESAREAN SECTION;  Surgeon: Genia Del, MD;  Location: [facility];  Service: Obstetrics;  Laterality: N/A;  EDD: 01/25/16  . CESAREAN SECTION N/A 05/29/2018   Procedure: Repeat CESAREAN SECTION;  Surgeon: Shea Evans, MD;  Location: [facility];  Service: Obstetrics;  Laterality: N/A;  EDD: 06/10/18  . CYSTECTOMY  2002   pilonidal region  . MOLE REMOVAL    . WISDOM TOOTH EXTRACTION      Family History  Problem Relation Age of Onset  . Cancer Maternal Grandmother        stomach  . Hypothyroidism Paternal Grandmother   . Atrial fibrillation Paternal Grandmother   . Angina Paternal Grandfather   . Hypertension Paternal Grandfather   . Hyperlipidemia Paternal Grandfather     Social History:  reports that she has never smoked. She has never used smokeless tobacco. She reports that she does not drink alcohol and does not use drugs.  Allergies:  Allergies  Allergen Reactions  . Benadryl [Diphenhydramine] Other (See Comments)    Makes skin crawl - pt prefers not to take   . Prednisone     Pt can not sleep - prefers not to take    Medications Prior to Admission  Medication Sig Dispense Refill Last Dose  .  acetaminophen (TYLENOL) 500 MG tablet Take 1,000 mg by mouth every 6 (six) hours as needed for moderate pain.     . budesonide-formoterol (SYMBICORT) 160-4.5 MCG/ACT inhaler Inhale 2 puffs into the lungs 2 (two) times daily.     Marland Kitchen buPROPion (WELLBUTRIN XL) 300 MG 24 hr tablet Take 300 mg by mouth daily.     . cetirizine (ZYRTEC) 10 MG tablet Take 10 mg by mouth daily.     . coconut oil OIL Apply 1 application topically as needed.  0   . docusate sodium (COLACE) 100 MG capsule Take 200 mg by mouth daily.      Marland Kitchen doxylamine, Sleep, (UNISOM) 25 MG tablet Take 12.5 mg by mouth at bedtime as needed for sleep.      . fluticasone (FLONASE) 50 MCG/ACT nasal spray Place 2 sprays into both nostrils daily.     Marland Kitchen ibuprofen (ADVIL,MOTRIN) 800 MG tablet Take 1 tablet (800 mg total) by mouth every 6 (six) hours. 30 tablet 0   . lansoprazole (PREVACID) 15 MG capsule Take 15 mg by mouth daily.      Marland Kitchen levalbuterol (XOPENEX HFA) 45 MCG/ACT inhaler Inhale 2 puffs into the lungs every 6 (six) hours as needed for wheezing.     . magnesium oxide (MAG-OX) 400 (241.3 Mg) MG tablet Take 1 tablet (400 mg total) by mouth daily. (Patient  taking differently: Take 800 mg by mouth daily. ) 30 tablet 4   . montelukast (SINGULAIR) 10 MG tablet Take 10 mg by mouth at bedtime.     . NON FORMULARY Allergy shots once every 2 weeks     . Omega-3 Fatty Acids (FISH OIL) 1000 MG CAPS Take 1 capsule by mouth daily.     Marland Kitchen oxyCODONE 10 MG TABS Take 1 tablet (10 mg total) by mouth every 4 (four) hours as needed (pain scale > 7). 30 tablet 0   . polyethylene glycol (MIRALAX / GLYCOLAX) packet Take 17 g by mouth daily as needed for moderate constipation.     . Prenatal Vit-Fe Fumarate-FA (PRENATAL MULTIVITAMIN) TABS tablet Take 3 tablets by mouth daily.      Marland Kitchen triamcinolone cream (KENALOG) 0.1 % Apply 1 application topically 2 (two) times daily as needed (For eczema.).       Review of Systems  Last menstrual period 04/03/2020, unknown if  currently breastfeeding. Physical Exam LMP 04/03/2020   Physical exam:  A&O x 3, no acute distress. Pleasant HEENT neg, no thyromegaly Lungs CTA bilat CV RRR, S1S2 normal Abdo soft, non tender, non acute, non surgical  Extr no edema/ tenderness Pelvic deferred since known ectopic   Results for orders placed or performed during the hospital encounter of 05/29/20 (from the past 24 hour(s))  CBC     Status: Abnormal   Collection Time: 05/29/20  9:27 PM  Result Value Ref Range   WBC 12.7 (H) 4.0 - 10.5 K/uL   RBC 4.21 3.87 - 5.11 MIL/uL   Hemoglobin 13.3 12.0 - 15.0 g/dL   HCT 06.2 36 - 46 %   MCV 94.1 80.0 - 100.0 fL   MCH 31.6 26.0 - 34.0 pg   MCHC 33.6 30.0 - 36.0 g/dL   RDW 69.4 85.4 - 62.7 %   Platelets 339 150 - 400 K/uL   nRBC 0.0 0.0 - 0.2 %  Type and screen [facility]     Status: None (Preliminary result)   Collection Time: 05/29/20  9:27 PM  Result Value Ref Range   ABO/RH(D) PENDING    Antibody Screen PENDING    Sample Expiration      06/01/2020,2359 Performed at [facility], [address] [place], [place] [address]   Respiratory Panel by RT PCR (Flu A&B, Covid) - Nasopharyngeal Swab     Status: None   Collection Time: 05/29/20  9:29 PM   Specimen: Nasopharyngeal Swab  Result Value Ref Range   SARS Coronavirus 2 by RT PCR NEGATIVE NEGATIVE   Influenza A by PCR NEGATIVE NEGATIVE   Influenza B by PCR NEGATIVE NEGATIVE    No results found.  Assessment/Plan: 39 yo G3P2002, with known ectopic pregnancy, worsening quant HCG c/w failed methotrexate that was given 7 days back OPtions- surgery, laparoscopic salpingectomy vs repeat methotrexate but I don't recommend it due to increased rupture risk. Pt agrees.  Reviewed surgery, Risks/complications of surgery reviewed incl infection, bleeding, damage to internal organs including bladder, bowels, ureters, blood vessels, other risks from anesthesia, VTE and delayed complications of any surgery,  complications in future surgery reviewed.  Proceed with Laparoscopic right salpingectomy for ectopic pregnancy, failed medical therapy  Robley Fries 05/29/2020, 10:27 PM

## 2020-05-29 NOTE — MAU Note (Signed)
Consent signed.  Pt transported to [facility] OR.  CRNA met at bedside.

## 2020-05-29 NOTE — Anesthesia Procedure Notes (Signed)
Procedure Name: Intubation Date/Time: 05/29/2020 11:31 PM Performed by: Claudina Lick, CRNA Pre-anesthesia Checklist: Patient identified, Emergency Drugs available, Suction available, Patient being monitored and Timeout performed Patient Re-evaluated:Patient Re-evaluated prior to induction Oxygen Delivery Method: Circle system utilized Preoxygenation: Pre-oxygenation with 100% oxygen Induction Type: IV induction, Rapid sequence and Cricoid Pressure applied Laryngoscope Size: Miller and 2 Grade View: Grade I Tube type: Oral Tube size: 7.0 mm Number of attempts: 1 Airway Equipment and Method: Stylet Placement Confirmation: ETT inserted through vocal cords under direct vision,  positive ETCO2 and breath sounds checked- equal and bilateral Secured at: 21 cm Tube secured with: Tape Dental Injury: Teeth and Oropharynx as per pre-operative assessment

## 2020-05-29 NOTE — MAU Note (Signed)
Spoke with Dr Ernestina Penna, pt not supposed to come through MAU- pt to have outpatient labs drawn. Registration made aware.

## 2020-05-29 NOTE — Anesthesia Preprocedure Evaluation (Addendum)
Anesthesia Evaluation  Patient identified by MRN, date of birth, ID band Patient awake    Reviewed: Allergy & Precautions, NPO status , Patient's Chart, lab work & pertinent test results  History of Anesthesia Complications (+) PONV and history of anesthetic complications  Airway Mallampati: II  TM Distance: >3 FB Neck ROM: Full    Dental  (+) Dental Advisory Given, Teeth Intact   Pulmonary asthma ,    breath sounds clear to auscultation       Cardiovascular negative cardio ROS   Rhythm:Regular     Neuro/Psych PSYCHIATRIC DISORDERS Anxiety Depression    GI/Hepatic negative GI ROS, Neg liver ROS,   Endo/Other  negative endocrine ROS  Renal/GU negative Renal ROS     Musculoskeletal negative musculoskeletal ROS (+)   Abdominal   Peds  Hematology negative hematology ROS (+) Lab Results      Component                Value               Date                      WBC                      12.7 (H)            05/29/2020                HGB                      13.3                05/29/2020                HCT                      39.6                05/29/2020                MCV                      94.1                05/29/2020                PLT                      339                 05/29/2020              Anesthesia Other Findings   Reproductive/Obstetrics Ectopic pregnancy                            Anesthesia Physical Anesthesia Plan  ASA: II and emergent  Anesthesia Plan: General   Post-op Pain Management:    Induction: Intravenous  PONV Risk Score and Plan: 4 or greater and Ondansetron and Dexamethasone  Airway Management Planned: Oral ETT  Additional Equipment: None  Intra-op Plan:   Post-operative Plan: Extubation in OR  Informed Consent: I have reviewed the patients History and Physical, chart, labs and discussed the procedure including the risks, benefits and  alternatives for the proposed anesthesia with the patient or authorized representative who has indicated his/her  understanding and acceptance.     Dental advisory given  Plan Discussed with: CRNA and Surgeon  Anesthesia Plan Comments:         Anesthesia Quick Evaluation

## 2020-05-29 NOTE — MAU Note (Signed)
Pt reports to MAU for repeat HCG lab draw. After results provider Juliene Pina, MD requested further evaluation in room. Pt denies vaginal bleeding or abdominal pain.

## 2020-05-30 ENCOUNTER — Encounter (HOSPITAL_COMMUNITY): Payer: Self-pay | Admitting: Obstetrics & Gynecology

## 2020-05-30 ENCOUNTER — Other Ambulatory Visit: Payer: Self-pay | Admitting: Obstetrics

## 2020-05-30 DIAGNOSIS — O00101 Right tubal pregnancy without intrauterine pregnancy: Secondary | ICD-10-CM | POA: Diagnosis not present

## 2020-05-30 LAB — TYPE AND SCREEN
ABO/RH(D): O NEG
Antibody Screen: POSITIVE

## 2020-05-30 MED ORDER — AMISULPRIDE (ANTIEMETIC) 5 MG/2ML IV SOLN
INTRAVENOUS | Status: AC
  Administered 2020-05-30: 10 mg via INTRAVENOUS
  Filled 2020-05-30: qty 4

## 2020-05-30 MED ORDER — IBUPROFEN 200 MG PO TABS: 600.0000 mg | ORAL_TABLET | Freq: Four times a day (QID) | ORAL | 0 refills | Status: DC | PRN

## 2020-05-30 MED ORDER — OXYCODONE HCL 5 MG PO TABS: 5.0000 mg | ORAL_TABLET | ORAL | 0 refills | Status: AC | PRN

## 2020-05-30 MED ORDER — ACETAMINOPHEN 160 MG/5ML PO SOLN: 1000.0000 mg | Freq: Once | ORAL | Status: DC | PRN

## 2020-05-30 MED ORDER — FENTANYL CITRATE (PF) 100 MCG/2ML IJ SOLN
25.0000 ug | INTRAMUSCULAR | Status: DC | PRN
  Administered 2020-05-30 (×2): 25 ug via INTRAVENOUS
  Administered 2020-05-30: 50 ug via INTRAVENOUS

## 2020-05-30 MED ORDER — ACETAMINOPHEN 10 MG/ML IV SOLN: 1000.0000 mg | Freq: Once | INTRAVENOUS | Status: DC | PRN

## 2020-05-30 MED ORDER — OXYCODONE HCL 5 MG/5ML PO SOLN: 5.0000 mg | Freq: Once | ORAL | Status: DC | PRN

## 2020-05-30 MED ORDER — FENTANYL CITRATE (PF) 100 MCG/2ML IJ SOLN
INTRAMUSCULAR | Status: AC
  Filled 2020-05-30: qty 2

## 2020-05-30 MED ORDER — AMISULPRIDE (ANTIEMETIC) 5 MG/2ML IV SOLN: 10.0000 mg | Freq: Once | INTRAVENOUS | Status: AC

## 2020-05-30 MED ORDER — SUGAMMADEX SODIUM 200 MG/2ML IV SOLN
INTRAVENOUS | Status: DC | PRN
  Administered 2020-05-30: 200 mg via INTRAVENOUS

## 2020-05-30 MED ORDER — ACETAMINOPHEN 500 MG PO TABS: 1000.0000 mg | ORAL_TABLET | Freq: Once | ORAL | Status: DC | PRN

## 2020-05-30 MED ORDER — OXYCODONE HCL 5 MG PO TABS: 5.0000 mg | ORAL_TABLET | Freq: Once | ORAL | Status: DC | PRN

## 2020-05-30 NOTE — Op Note (Signed)
Preoperative diagnosis: Right adnexal ectopic pregnancy, failed Methotrexate treatment Postoperative diagnosis: Same Procedure: Laparoscopic right salpingectomy  Surgeon: Dr Shea Evans, MD Assistants: Dorisann Frames, CNM Anesthesia Gen. Endotracheal IV fluids LR  1500 cc LR EBL minimal  10 cc Urine clear  150 cc clear in foley Complications none Disposition PACU and home Specimens  Right fallopian tube with ectopic pregnancy Procedure Patient with failed methotrexate treatment for right ectopic with rising quant HCG Options were reviewed and laparoscopic salpingectomy advised, risk and complications of surgery including infection, bleeding, damage to internal organs, other complications including pneumonia, VTE were reviewed. Informed written consent was obtained and patient was brought to the operating room with IV running. Timeout was carried out. 2 gm Ancef given.  She underwent general anesthesia without difficulty and was given dorsal lithotomy position. She was prepped and draped in standard fashion and foley was placed. Hulka uterine manipulator placed. 10 mm incision made at the umbilicus lower edge after injecting marcaine. Fascia grasped and incised. Peritoneum entered bluntly with finger dissection, stay sutures of 0-Vicryl placed on fascia,. #11 trocar inserted and secured with suture. Pneumoperitoneum created. [place]edenburg given. Two 5 mm lower incisions made after marcaine injection, and 5 mm trocars inserted. No hemoperitoneum noted. Right tube noted with ampullary unruptured ectopic pregnancy and left tube and both ovaries normal. No adhesions noted.  Using Ligasure, right  mesosalpinx was desiccated and cut to perform salpingectomy.  Cut edge appeared hemostatic. Endobag placed from central post while visualizing with 5 mm laparoscope from LLQ port. Specimen placed in endobag and removed with central port and central port was reinserted. Hemostasis noted. All ports removed under  vision and pneumperitoneum released. Central port fascia closed with 0-Vicryl and all skin incisions closed with 4-0 Vicryl and Dermabond applied. Hulka manipulator and foley removed.  All counts were correct x2 patient was reversed from general anesthesia extubated and brought out to the recovery room in stable condition. Plan is to discharge home from recovery room. Surgical findings were discussed with patient's husband.  I was scrubbed the entire time and performed the surgery.    V.Davyn Morandi, MD

## 2020-05-30 NOTE — Discharge Instructions (Signed)
Ectopic Pregnancy ° °An ectopic pregnancy is when the fertilized egg attaches (implants) outside the uterus. Most ectopic pregnancies occur in one of the tubes where eggs travel from the ovary to the uterus (fallopian tubes), but the implanting can occur in other locations. In rare cases, ectopic pregnancies occur on the ovary, intestine, pelvis, abdomen, or cervix. In an ectopic pregnancy, the fertilized egg does not have the ability to develop into a normal, healthy baby. °A ruptured ectopic pregnancy is one in which tearing or bursting of a fallopian tube causes internal bleeding. Often, there is intense lower abdominal pain, and vaginal bleeding sometimes occurs. Having an ectopic pregnancy can be life-threatening. If this dangerous condition is not treated, it can lead to blood loss, shock, or even death. °What are the causes? °The most common cause of this condition is damage to one of the fallopian tubes. A fallopian tube may be narrowed or blocked, and that keeps the fertilized egg from reaching the uterus. °What increases the risk? °This condition is more likely to develop in women of childbearing age who have different levels of risk. The levels of risk can be divided into three categories. °High risk °· You have gone through infertility treatment. °· You have had an ectopic pregnancy before. °· You have had surgery on the fallopian tubes, or another surgical procedure, such as an abortion. °· You have had surgery to have the fallopian tubes tied (tubal ligation). °· You have problems or diseases of the fallopian tubes. °· You have been exposed to diethylstilbestrol (DES). This medicine was used until 1971, and it had effects on babies whose mothers took the medicine. °· You become pregnant while using an IUD (intrauterine device) for birth control. °Moderate risk °· You have a history of infertility. °· You have had an STI (sexually transmitted infection). °· You have a history of pelvic inflammatory  disease (PID). °· You have scarring from endometriosis. °· You have multiple sexual partners. °· You smoke. °Low risk °· You have had pelvic surgery. °· You use vaginal douches. °· You became sexually active before age 18. °What are the signs or symptoms? °Common symptoms of this condition include normal pregnancy symptoms, such as missing a period, nausea, tiredness, abdominal pain, breast tenderness, and bleeding. However, ectopic pregnancy will have additional symptoms, such as: °· Pain with intercourse. °· Irregular vaginal bleeding or spotting. °· Cramping or pain on one side or in the lower abdomen. °· Fast heartbeat, low blood pressure, and sweating. °· Passing out while having a bowel movement. °Symptoms of a ruptured ectopic pregnancy and internal bleeding may include: °· Sudden, severe pain in the abdomen and pelvis. °· Dizziness, weakness, light-headedness, or fainting. °· Pain in the shoulder or neck area. °How is this diagnosed? °This condition is diagnosed by: °· A pelvic exam to locate pain or a mass in the abdomen. °· A pregnancy test. This blood test checks for the presence as well as the specific level of pregnancy hormone in the bloodstream. °· Ultrasound. This is performed if a pregnancy test is positive. In this test, a probe is inserted into the vagina. The probe will detect a fetus, possibly in a location other than the uterus. °· Taking a sample of uterus tissue (dilation and curettage, or D&C). °· Surgery to perform a visual exam of the inside of the abdomen using a thin, lighted tube that has a tiny camera on the end (laparoscope). °· Culdocentesis. This procedure involves inserting a needle at the top of   the vagina, behind the uterus. If blood is present in this area, it may indicate that a fallopian tube is torn. How is this treated? This condition is treated with medicine or surgery. Medicine  An injection of a medicine (methotrexate) may be given to cause the pregnancy tissue to be  absorbed. This medicine may save your fallopian tube. It may be given if: ? The diagnosis is made early, with no signs of active bleeding. ? The fallopian tube has not ruptured. ? You are considered to be a good candidate for the medicine. Usually, pregnancy hormone blood levels are checked after methotrexate treatment. This is to be sure that the medicine is effective. It may take 4-6 weeks for the pregnancy to be absorbed. Most pregnancies will be absorbed by 3 weeks. Surgery  A laparoscope may be used to remove the pregnancy tissue.  If severe internal bleeding occurs, a larger cut (incision) may be made in the lower abdomen (laparotomy) to remove the fetus and placenta. This is done to stop the bleeding.  Part or all of the fallopian tube may be removed (salpingectomy) along with the fetus and placenta. The fallopian tube may also be repaired during the surgery.  In very rare circumstances, removal of the uterus (hysterectomy) may be required.  After surgery, pregnancy hormone testing may be done to be sure that there is no pregnancy tissue left. Whether your treatment is medicine or surgery, you may receive a Rho (D) immune globulin shot to prevent problems with any future pregnancy. This shot may be given if:  You are Rh-negative and the baby's father is Rh-positive.  You are Rh-negative and you do not know the Rh type of the baby's father. Follow these instructions at home:  Rest and limit your activity after the procedure for as long as told by your health care provider.  Until your health care provider says that it is safe: ? Do not lift anything that is heavier than 10 lb (4.5 kg), or the limit that your health care provider tells you. ? Avoid physical exercise and any movement that requires effort (is strenuous).  To help prevent constipation: ? Eat a healthy diet that includes fruits, vegetables, and whole grains. ? Drink 6-8 glasses of water per day. Get help right away  if:  You develop worsening pain that is not relieved by medicine.  You have: ? A fever or chills. ? Vaginal bleeding. ? Redness and swelling at the incision site. ? Nausea and vomiting.  You feel dizzy or weak.  You feel light-headed or you faint. This information is not intended to replace advice given to you by your health care provider. Make sure you discuss any questions you have with your health care provider. Document Revised: 07/05/2017 Document Reviewed: 02/22/2016 Elsevier Patient Education  2020 [employer]   General Anesthesia, Adult, Care After This sheet gives you information about how to care for yourself after your procedure. Your health care provider may also give you more specific instructions. If you have problems or questions, contact your health care provider. What can I expect after the procedure? After the procedure, the following side effects are common:  Pain or discomfort at the IV site.  Nausea.  Vomiting.  Sore throat.  Trouble concentrating.  Feeling cold or chills.  Weak or tired.  Sleepiness and fatigue.  Soreness and body aches. These side effects can affect parts of the body that were not involved in surgery. Follow these instructions at home:  For  least 24 hours after the procedure:  Have a responsible adult stay with you. It is important to have someone help care for you until you are awake and alert.  Rest as needed.  Do not: ? Participate in activities in which you could fall or become injured. ? Drive. ? Use heavy machinery. ? Drink alcohol. ? Take sleeping pills or medicines that cause drowsiness. ? Make important decisions or sign legal documents. ? Take care of children on your own. Eating and drinking  Follow any instructions from your health care provider about eating or drinking restrictions.  When you feel hungry, start by eating small amounts of foods that are soft and easy to digest (bland), such as toast.  Gradually return to your regular diet.  Drink enough fluid to keep your urine pale yellow.  If you vomit, rehydrate by drinking water, juice, or clear broth. General instructions  If you have sleep apnea, surgery and certain medicines can increase your risk for breathing problems. Follow instructions from your health care provider about wearing your sleep device: ? Anytime you are sleeping, including during daytime naps. ? While taking prescription pain medicines, sleeping medicines, or medicines that make you drowsy.  Return to your normal activities as told by your health care provider. Ask your health care provider what activities are safe for you.  Take over-the-counter and prescription medicines only as told by your health care provider.  If you smoke, do not smoke without supervision.  Keep all follow-up visits as told by your health care provider. This is important. Contact a health care provider if:  You have nausea or vomiting that does not get better with medicine.  You cannot eat or drink without vomiting.  You have pain that does not get better with medicine.  You are unable to pass urine.  You develop a skin rash.  You have a fever.  You have redness around your IV site that gets worse. Get help right away if:  You have difficulty breathing.  You have chest pain.  You have blood in your urine or stool, or you vomit blood. Summary  After the procedure, it is common to have a sore throat or nausea. It is also common to feel tired.  Have a responsible adult stay with you for the first 24 hours after general anesthesia. It is important to have someone help care for you until you are awake and alert.  When you feel hungry, start by eating small amounts of foods that are soft and easy to digest (bland), such as toast. Gradually return to your regular diet.  Drink enough fluid to keep your urine pale yellow.  Return to your normal activities as told by your  health care provider. Ask your health care provider what activities are safe for you. This information is not intended to replace advice given to you by your health care provider. Make sure you discuss any questions you have with your health care provider. Document Revised: 07/26/2017 Document Reviewed: 03/08/2017 Elsevier Patient Education  2020 Elsevier Inc.   

## 2020-05-30 NOTE — Transfer of Care (Signed)
Immediate Anesthesia Transfer of Care Note  Patient: Adrienne Williams  Procedure(s) Performed: DIAGNOSTIC LAPAROSCOPY WITH REMOVAL OF ECTOPIC PREGNANCY (Right ) UNILATERAL SALPINGECTOMY (Right )  Patient Location: PACU  Anesthesia Type:General  Level of Consciousness: awake  Airway & Oxygen Therapy: Patient Spontanous Breathing  Post-op Assessment: Report given to RN and Post -op Vital signs reviewed and stable  Post vital signs: Reviewed and stable  Last Vitals:  Vitals Value Taken Time  BP    Temp    Pulse    Resp    SpO2      Last Pain: There were no vitals filed for this visit.       Complications: No complications documented.

## 2020-05-31 LAB — SURGICAL PATHOLOGY

## 2020-05-31 NOTE — Anesthesia Postprocedure Evaluation (Signed)
Anesthesia Post Note  Patient: Adrienne Williams  Procedure(s) Performed: DIAGNOSTIC LAPAROSCOPY WITH REMOVAL OF ECTOPIC PREGNANCY (Right ) UNILATERAL SALPINGECTOMY (Right )     Patient location during evaluation: PACU Anesthesia Type: General Level of consciousness: awake and alert Pain management: pain level controlled Vital Signs Assessment: post-procedure vital signs reviewed and stable Respiratory status: spontaneous breathing, nonlabored ventilation, respiratory function stable and patient connected to nasal cannula oxygen Cardiovascular status: blood pressure returned to baseline and stable Postop Assessment: no apparent nausea or vomiting Anesthetic complications: no   No complications documented.  Last Vitals:  Vitals:   05/30/20 0030 05/30/20 0130  BP: 123/83 113/81  Pulse: 98   Resp: (!) 23   Temp:  (!) 36.1 C  SpO2: 100%     Last Pain:  Vitals:   05/30/20 0130  PainSc: 2                  Anaalicia Reimann

## 2020-07-15 ENCOUNTER — Encounter (HOSPITAL_COMMUNITY): Payer: Self-pay | Admitting: Emergency Medicine

## 2020-07-15 ENCOUNTER — Emergency Department (HOSPITAL_COMMUNITY)
Admission: EM | Admit: 2020-07-15 | Discharge: 2020-07-15 | Disposition: A | Discharge status: Home / Self Care | Payer: Managed Care, Other (non HMO) | Attending: Emergency Medicine | Admitting: Emergency Medicine

## 2020-07-15 ENCOUNTER — Other Ambulatory Visit: Payer: Self-pay

## 2020-07-15 DIAGNOSIS — J45909 Unspecified asthma, uncomplicated: Secondary | ICD-10-CM | POA: Insufficient documentation

## 2020-07-15 DIAGNOSIS — R040 Epistaxis: Secondary | ICD-10-CM | POA: Diagnosis not present

## 2020-07-15 MED ORDER — OXYMETAZOLINE HCL 0.05 % NA SOLN
1.0000 | Freq: Once | NASAL | Status: AC
  Administered 2020-07-15: 1 via NASAL
  Filled 2020-07-15: qty 30

## 2020-07-15 NOTE — Discharge Instructions (Addendum)
Please return for any problem.  Follow-up with ENT as you have already scheduled.  Use Afrin, apply pressure, and remember to lean forward if you have recurrent nosebleed.  If bleeding does not stop after 30 minutes of pressure please return to the ED for evaluation.

## 2020-07-15 NOTE — ED Provider Notes (Signed)
[facility] EMERGENCY DEPARTMENT Provider Note   CSN: [id number] Arrival date & time: 07/15/20  1415     History Chief Complaint  Patient presents with  . Epistaxis    Adrienne Williams is a 39 y.o. female.  39 year old female with prior medical history as detailed below presents for evaluation of nosebleed.  Patient reports that her nosebleed started this afternoon around 115.  She was unable to control the bleeding at home.  She presented to the ED for evaluation.  Afrin was administered to both nares in triage.  Patient has been applying pressure to her naris externally for the last hour and a half awaiting for bed.  Upon my evaluation she is without active bleeding.  She denies other complaint.  She denies recent fever, injury, associated nausea, chest pain, shortness of breath, or other complaint.  The history is provided by the patient.  Epistaxis Location:  L nare Severity:  Mild Duration:  3 hours Timing:  Rare Progression:  Resolved Chronicity:  New Relieved by:  Nothing Worsened by:  Movement Ineffective treatments:  Applying pressure      Past Medical History:  Diagnosis Date  . Anxiety   . Asthma   . Depression   . Frank breech presentation 05/29/2018  . Hemorrhoids   . Hx of varicella   . PONV (postoperative nausea and vomiting)    "little nauseated"  . Postpartum care following cesarean delivery (6/14) 01/18/2016    Patient Active Problem List   Diagnosis Date Noted  . Anxiety and depression 05/31/2018  . Rh negative, maternal 05/31/2018  . Frank breech presentation 05/29/2018  . Status post repeat low transverse cesarean section 05/29/2018  . Postpartum care following cesarean delivery (10/24) 05/29/2018    Past Surgical History:  Procedure Laterality Date  . CESAREAN SECTION N/A 01/18/2016   Procedure: Primary CESAREAN SECTION;  Surgeon: Genia Del, MD;  Location: [facility];  Service: Obstetrics;  Laterality:  N/A;  EDD: 01/25/16  . CESAREAN SECTION N/A 05/29/2018   Procedure: Repeat CESAREAN SECTION;  Surgeon: Shea Evans, MD;  Location: [facility] BIRTHING SUITES;  Service: Obstetrics;  Laterality: N/A;  EDD: 06/10/18  . CYSTECTOMY  2002   pilonidal region  . DIAGNOSTIC LAPAROSCOPY WITH REMOVAL OF ECTOPIC PREGNANCY Right 05/29/2020   Procedure: DIAGNOSTIC LAPAROSCOPY WITH REMOVAL OF ECTOPIC PREGNANCY;  Surgeon: Shea Evans, MD;  Location: [facility] OR;  Service: Gynecology;  Laterality: Right;  . MOLE REMOVAL    . UNILATERAL SALPINGECTOMY Right 05/29/2020   Procedure: UNILATERAL SALPINGECTOMY;  Surgeon: Shea Evans, MD;  Location: [facility] OR;  Service: Gynecology;  Laterality: Right;  . WISDOM TOOTH EXTRACTION       OB History    Gravida  4   Para  1   Term  1   Preterm      AB  1   Living  1     SAB  1   IAB      Ectopic      Multiple  0   Live Births  1           Family History  Problem Relation Age of Onset  . Cancer Maternal Grandmother        stomach  . Hypothyroidism Paternal Grandmother   . Atrial fibrillation Paternal Grandmother   . Angina Paternal Grandfather   . Hypertension Paternal Grandfather   . Hyperlipidemia Paternal Grandfather     Social History   Tobacco Use  . Smoking status: Never  Smoker  . Smokeless tobacco: Never Used  Vaping Use  . Vaping Use: Never used  Substance Use Topics  . Alcohol use: No  . Drug use: No    Home Medications Prior to Admission medications   Medication Sig Start Date End Date Taking? Authorizing Provider  acetaminophen (TYLENOL) 500 MG tablet Take 1,000 mg by mouth every 6 (six) hours as needed for moderate pain.    [provider]  budesonide-formoterol (SYMBICORT) 160-4.5 MCG/ACT inhaler Inhale 2 puffs into the lungs 2 (two) times daily.    [provider]  buPROPion (WELLBUTRIN XL) 300 MG 24 hr tablet Take 300 mg by mouth daily.    [provider]  cetirizine (ZYRTEC) 10 MG tablet Take 10  mg by mouth daily.    [provider]  coconut oil OIL Apply 1 application topically as needed. 05/31/18   Neta Mends, CNM  docusate sodium (COLACE) 100 MG capsule Take 200 mg by mouth daily.     [provider]  doxylamine, Sleep, (UNISOM) 25 MG tablet Take 12.5 mg by mouth at bedtime as needed for sleep.     [provider]  fluticasone (FLONASE) 50 MCG/ACT nasal spray Place 2 sprays into both nostrils daily.    [provider]  ibuprofen (ADVIL,MOTRIN) 800 MG tablet Take 1 tablet (800 mg total) by mouth every 6 (six) hours. 05/31/18   Neta Mends, CNM  ibuprofen (MOTRIN IB) 200 MG tablet Take 3 tablets (600 mg total) by mouth every 6 (six) hours as needed. 05/30/20   Shea Evans, MD  lansoprazole (PREVACID) 15 MG capsule Take 15 mg by mouth daily.     [provider]  levalbuterol Pauline Aus HFA) 45 MCG/ACT inhaler Inhale 2 puffs into the lungs every 6 (six) hours as needed for wheezing.    [provider]  magnesium oxide (MAG-OX) 400 (241.3 Mg) MG tablet Take 1 tablet (400 mg total) by mouth daily. Patient taking differently: Take 800 mg by mouth daily.  01/21/16   Marlinda Mike., CNM  montelukast (SINGULAIR) 10 MG tablet Take 10 mg by mouth at bedtime.    [provider]  NON FORMULARY Allergy shots once every 2 weeks    [provider]  Omega-3 Fatty Acids (FISH OIL) 1000 MG CAPS Take 1 capsule by mouth daily.    [provider]  polyethylene glycol (MIRALAX / GLYCOLAX) packet Take 17 g by mouth daily as needed for moderate constipation.    [provider]  Prenatal Vit-Fe Fumarate-FA (PRENATAL MULTIVITAMIN) TABS tablet Take 3 tablets by mouth daily.     [provider]  triamcinolone cream (KENALOG) 0.1 % Apply 1 application topically 2 (two) times daily as needed (For eczema.).    [provider]    Allergies    Benadryl [diphenhydramine] and Prednisone  Review of  Systems   Review of Systems  HENT: Positive for nosebleeds.   All other systems reviewed and are negative.   Physical Exam Updated Vital Signs BP 137/86   Pulse 98   Temp 98 F (36.7 C) (Oral)   Resp 18   Ht 5\' 3"  (1.6 m)   Wt 65.8 kg   LMP 04/03/2020   SpO2 97%   BMI 25.69 kg/m   Physical Exam Vitals and nursing note reviewed.  Constitutional:      General: She is not in acute distress.    Appearance: She is well-developed and well-nourished.  HENT:     Head:  Normocephalic and atraumatic.     Nose: Nose normal.     Comments: No active epistaxis noted.  No significant abrasion or clot in either nare noted.  No postnasal drainage/blood noted.    Mouth/Throat:     Mouth: Oropharynx is clear and moist.  Eyes:     Extraocular Movements: EOM normal.     Conjunctiva/sclera: Conjunctivae normal.     Pupils: Pupils are equal, round, and reactive to light.  Cardiovascular:     Rate and Rhythm: Normal rate and regular rhythm.     Heart sounds: Normal heart sounds.  Pulmonary:     Effort: Pulmonary effort is normal. No respiratory distress.     Breath sounds: Normal breath sounds.  Abdominal:     General: There is no distension.     Palpations: Abdomen is soft.     Tenderness: There is no abdominal tenderness.  Musculoskeletal:        General: No deformity or edema. Normal range of motion.     Cervical back: Normal range of motion and neck supple.  Skin:    General: Skin is warm and dry.  Neurological:     Mental Status: She is alert and oriented to person, place, and time.  Psychiatric:        Mood and Affect: Mood and affect normal.     ED Results / Procedures / Treatments   Labs (all labs ordered are listed, but only abnormal results are displayed) Labs Reviewed - No data to display  EKG None  Radiology No results found.  Procedures Procedures (including critical care time)  Medications Ordered in ED Medications  oxymetazoline (AFRIN) 0.05 % nasal  spray 1 spray (1 spray Each Nare Given 07/15/20 1425)    ED Course  I have reviewed the triage vital signs and the nursing notes.  Pertinent labs & imaging results that were available during my care of the patient were reviewed by me and considered in my medical decision making (see chart for details).    MDM Rules/Calculators/A&P                          MDM  Screen complete  Adrienne Williams was evaluated in Emergency Department on 07/15/2020 for the symptoms described in the history of present illness. She was evaluated in the context of the global COVID-19 pandemic, which necessitated consideration that the patient might be at risk for infection with the SARS-CoV-2 virus that causes COVID-19. Institutional protocols and algorithms that pertain to the evaluation of patients at risk for COVID-19 are in a state of rapid change based on information released by regulatory bodies including the CDC and federal and state organizations. These policies and algorithms were followed during the patient's care in the ED.  Patient is presenting for evaluation of reported epistaxis.  Epistaxis resolved with direct pressure and application of Afrin in triage.  On my evaluation the patient is without active bleeding.  Patient has already arranged for follow-up with ENT.  Patient educated regarding the appropriate management for a nosebleed when at home.  Strict return precautions given and understood.  Importance of close follow-up is stressed.  Final Clinical Impression(s) / ED Diagnoses Final diagnoses:  Epistaxis    Rx / DC Orders ED Discharge Orders    None       Wynetta Fines, MD 07/15/20 [phone]

## 2020-07-15 NOTE — ED Triage Notes (Signed)
Pt arrives to ED with complaints of nosebleed starting at 1315 today. Pt unable to control bleed and bleeding starting to drain down pts throat. Afrin given in triage.

## 2021-12-07 ENCOUNTER — Other Ambulatory Visit: Payer: Self-pay | Admitting: General Surgery

## 2021-12-07 DIAGNOSIS — Z1501 Genetic susceptibility to malignant neoplasm of breast: Secondary | ICD-10-CM

## 2021-12-11 ENCOUNTER — Telehealth: Payer: Self-pay | Admitting: Genetic Counselor

## 2021-12-11 NOTE — Telephone Encounter (Signed)
Scheduled appt per 5/8 referral. Pt is aware of appt date and time. Pt is aware to arrive 15 mins prior to appt time and to bring and updated insurance card. Pt is aware of appt location.   ?

## 2021-12-26 ENCOUNTER — Encounter: Payer: Self-pay | Admitting: Gastroenterology

## 2021-12-26 ENCOUNTER — Ambulatory Visit: Payer: Managed Care, Other (non HMO) | Admitting: Gastroenterology

## 2021-12-26 VITALS — BP 120/76 | HR 74 | Ht 63.0 in | Wt 134.8 lb

## 2021-12-26 DIAGNOSIS — Z1501 Genetic susceptibility to malignant neoplasm of breast: Secondary | ICD-10-CM

## 2021-12-26 DIAGNOSIS — Z800 Family history of malignant neoplasm of digestive organs: Secondary | ICD-10-CM

## 2021-12-26 DIAGNOSIS — Z1589 Genetic susceptibility to other disease: Secondary | ICD-10-CM

## 2021-12-26 DIAGNOSIS — Z1509 Genetic susceptibility to other malignant neoplasm: Secondary | ICD-10-CM

## 2021-12-26 DIAGNOSIS — Z1502 Genetic susceptibility to malignant neoplasm of ovary: Secondary | ICD-10-CM

## 2021-12-26 DIAGNOSIS — K5900 Constipation, unspecified: Secondary | ICD-10-CM | POA: Diagnosis not present

## 2021-12-26 MED ORDER — NA SULFATE-K SULFATE-MG SULF 17.5-3.13-1.6 GM/177ML PO SOLN: 1.0000 | ORAL | 0 refills | Status: DC

## 2021-12-26 NOTE — Progress Notes (Signed)
HPI : Adrienne Williams is a very pleasant 41 year old female with recently diagnosed Chek 2 mutation.  The patient was recommended to undergo genetic screening because of an unknown family history of cancer, maternal grandmother history of stomach cancer and a personal history of melanoma.  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  She was seen by Dr. Donne Hazel of general surgery, and decision was made to hold off on prophylactic mastectomy given absence of accurate family history.  She was recommended to undergo intensive screening with mammograms and MRIs. She has no known family history of colon cancer, but the medical history of her mother and father is unknown.  Her maternal grandmother was diagnosed with stomach cancer in her 40s.  The patient reports having occasional bright red blood per rectum which has been occurring ever since her pregnancy 3 years ago.  She has chronic constipation at baseline, with a bowel movement every 2 to 3 days.  No significant straining, but sometimes she does have hard stools.  She denies symptoms of diarrhea or abdominal pain.  No chronic upper GI symptoms such as nausea, vomiting, heartburn/acid regurgitation, early satiety. Her weight has been stable.     Past Medical History:  Diagnosis Date   Anxiety    Asthma    Depression    Frank breech presentation 05/29/2018   Hemorrhoids    Hx of varicella    PONV (postoperative nausea and vomiting)    "little nauseated"   Postpartum care following cesarean delivery (6/14) 01/18/2016     Past Surgical History:  Procedure Laterality Date   CESAREAN SECTION N/A 01/18/2016   Procedure: Primary CESAREAN SECTION;  Surgeon: Princess Bruins, MD;  Location: [place];  Service: Obstetrics;  Laterality: N/A;  EDD: 01/25/16   CESAREAN SECTION N/A 05/29/2018   Procedure: Repeat CESAREAN SECTION;  Surgeon: Azucena Fallen, MD;  Location: [place];  Service:  Obstetrics;  Laterality: N/A;  EDD: 06/10/18   CYSTECTOMY  2002   pilonidal region   DIAGNOSTIC LAPAROSCOPY WITH REMOVAL OF ECTOPIC PREGNANCY Right 05/29/2020   Procedure: DIAGNOSTIC LAPAROSCOPY WITH REMOVAL OF ECTOPIC PREGNANCY;  Surgeon: Azucena Fallen, MD;  Location: [place];  Service: Gynecology;  Laterality: Right;   MOLE REMOVAL     UNILATERAL SALPINGECTOMY Right 05/29/2020   Procedure: UNILATERAL SALPINGECTOMY;  Surgeon: Azucena Fallen, MD;  Location: [place];  Service: Gynecology;  Laterality: Right;   WISDOM TOOTH EXTRACTION     Family History  Problem Relation Age of Onset   ADD / ADHD Brother    Cancer Maternal Grandmother        stomach   Hypothyroidism Paternal Grandmother    Atrial fibrillation Paternal Grandmother    Angina Paternal Grandfather    Hypertension Paternal Grandfather    Hyperlipidemia Paternal Grandfather    Colon polyps Paternal Grandfather    Colon cancer Neg Hx    Esophageal cancer Neg Hx    Social History   Tobacco Use   Smoking status: Never   Smokeless tobacco: Never  Vaping Use   Vaping Use: Never used  Substance Use Topics   Alcohol use: No   Drug use: No   Current Outpatient Medications  Medication Sig Dispense Refill   albuterol (VENTOLIN HFA) 108 (90 Base) MCG/ACT inhaler Inhale into the lungs.     buPROPion (WELLBUTRIN XL) 300 MG 24 hr tablet Take 300 mg by mouth daily.     busPIRone (BUSPAR) 30 MG tablet Take  30 mg by mouth 2 (two) times daily.     cetirizine (ZYRTEC) 10 MG tablet Take 10 mg by mouth daily.     EPINEPHrine 0.3 mg/0.3 mL IJ SOAJ injection SMARTSIG:0.3 Milligram(s) IM Once PRN     lamoTRIgine (LAMICTAL) 25 MG tablet Take 50 mg by mouth daily.     LORazepam (ATIVAN) 1 MG tablet Take by mouth.     magnesium oxide (MAG-OX) 400 (241.3 Mg) MG tablet Take 1 tablet (400 mg total) by mouth daily. (Patient taking differently: Take 800 mg by mouth daily.) 30 tablet 4   montelukast (SINGULAIR) 10 MG tablet Take 10 mg by mouth at  bedtime.     NON FORMULARY Allergy shots once every 2 weeks     Omega-3 Fatty Acids (FISH OIL) 1000 MG CAPS Take 1 capsule by mouth daily.     VRAYLAR 1.5 MG capsule Take 1.5 mg by mouth daily.     No current facility-administered medications for this visit.   Allergies  Allergen Reactions   Benadryl [Diphenhydramine] Other (See Comments)    Makes skin crawl - pt prefers not to take    Prednisone     Pt can not sleep - prefers not to take     Review of Systems: All systems reviewed and negative except where noted in HPI.    No results found.  Physical Exam: BP 120/76   Pulse 74   Ht _0  (1.6 m)   Wt 134 lb 12.8 oz (61.1 kg)   SpO2 99%   BMI 23.88 kg/m  Constitutional: Pleasant,well-developed, Caucasian female in no acute distress. HEENT: Normocephalic and atraumatic. Conjunctivae are normal. No scleral icterus.  Mallampati 2 Neck supple.  Cardiovascular: Normal rate, regular rhythm.  Pulmonary/chest: Effort normal and breath sounds normal. No wheezing, rales or rhonchi. Abdominal: Soft, nondistended, nontender. Bowel sounds active throughout. There are no masses palpable. No hepatomegaly. Extremities: no edema Neurological: Alert and oriented to person place and time. Skin: Skin is warm and dry. No rashes noted. Psychiatric: Normal mood and affect. Behavior is normal.  CBC    Component Value Date/Time   WBC 12.7 (H) 05/29/2020 2127   RBC 4.21 05/29/2020 2127   HGB 13.3 05/29/2020 2127   HCT 39.6 05/29/2020 2127   PLT 339 05/29/2020 2127   MCV 94.1 05/29/2020 2127   MCH 31.6 05/29/2020 2127   MCHC 33.6 05/29/2020 2127   RDW 12.8 05/29/2020 2127   LYMPHSABS 2.2 05/23/2020 1430   MONOABS 0.5 05/23/2020 1430   EOSABS 0.1 05/23/2020 1430   BASOSABS 0.0 05/23/2020 1430    CMP     Component Value Date/Time   NA 135 05/23/2020 1430   K 3.6 05/23/2020 1430   CL 102 05/23/2020 1430   CO2 24 05/23/2020 1430   GLUCOSE 131 (H) 05/23/2020 1430   BUN 9 05/23/2020  1430   CREATININE 0.75 05/23/2020 1430   CALCIUM 9.5 05/23/2020 1430   PROT 6.9 05/23/2020 1430   ALBUMIN 4.0 05/23/2020 1430   AST 15 05/23/2020 1430   ALT 16 05/23/2020 1430   ALKPHOS 47 05/23/2020 1430   BILITOT 0.4 05/23/2020 1430   GFRNONAA >60 05/23/2020 1430   GFRAA >60 05/09/2018 2144     ASSESSMENT AND PLAN: 41 year old female with history of melanoma and family history of stomach cancer (maternal grandmother, 22s) with unknown parental family medical history, 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  NCCN guidelines would recommend she undergo a colonoscopy at age 79, followed by  surveillance colonoscopies every 5 years.  There is not seem to be a reliable increased risk of stomach cancer with this mutation, but given the uncertainty and her family history of stomach cancer, I think an upper endoscopy with gastric biopsies to assess for intestinal metaplasia is reasonable.  I would not recommend routine upper endoscopy surveillance if her gastric biopsies are normal at this time.   CHEK2 mutation - Colonoscopy now and then every 5 years  Family history of stomach cancer/CHEK2 mutation - EGD with gastric biopsies, if normal periodically reassess need for further upper endoscopy  The details, risks (including bleeding, perforation, infection, missed lesions, medication reactions and possible hospitalization or surgery if complications occur), benefits, and alternatives to EGD/colonoscopy with possible biopsy and possible polypectomy were discussed with the patient and she consents to proceed.   Ruqayya Ventress E. Candis Schatz, MD [facility]   Marda Stalker, PA-C

## 2021-12-26 NOTE — Patient Instructions (Signed)
If you are age 41 or younger, your body mass index should be between 19-25. Your Body mass index is 23.88 kg/m. If this is out of the aformentioned range listed, please consider follow up with your Primary Care Provider.  ________________________________________________________  The [facility] providers would like to encourage you to use [facility] to communicate with providers for non-urgent requests or questions.  Due to long hold times on the telephone, sending your provider a message by [facility] may be a faster and more efficient way to get a response.  Please allow 48 business hours for a response.  Please remember that this is for non-urgent requests.  _______________________________________________________  Please use Miralax daily the week leading up to your procedure.   You have been scheduled for an endoscopy and colonoscopy. Please follow the written instructions given to you at your visit today. Please pick up your prep supplies at the pharmacy within the next 1-3 days. If you use inhalers (even only as needed), please bring them with you on the day of your procedure.  Due to recent changes in healthcare laws, you may see the results of your imaging and laboratory studies on MyChart before your provider has had a chance to review them.  We understand that in some cases there may be results that are confusing or concerning to you. Not all laboratory results come back in the same time frame and the provider may be waiting for multiple results in order to interpret others.  Please give [place] 48 hours in order for your provider to thoroughly review all the results before contacting the office for clarification of your results.   Thank you for entrusting me with your care and choosing [facility].  Dr Tomasa Rand

## 2022-01-12 ENCOUNTER — Other Ambulatory Visit: Payer: Self-pay | Admitting: General Surgery

## 2022-01-12 ENCOUNTER — Ambulatory Visit
Admission: RE | Admit: 2022-01-12 | Discharge: 2022-01-12 | Disposition: A | Discharge status: Home / Self Care | Payer: Managed Care, Other (non HMO) | Source: Ambulatory Visit | Attending: General Surgery | Admitting: General Surgery

## 2022-01-12 DIAGNOSIS — R9389 Abnormal findings on diagnostic imaging of other specified body structures: Secondary | ICD-10-CM

## 2022-01-12 DIAGNOSIS — Z1501 Genetic susceptibility to malignant neoplasm of breast: Secondary | ICD-10-CM

## 2022-01-12 IMAGING — MR MR BREAST BILAT WO/W CM
8 of 14 series · 32 of 48 positions shown · IV contrast (6ml gadavist)
Comparison: No previous breast MRI.

CLINICAL DATA: Dense breasts. Monoallelic mutation of [R3] gene.
Family history of breast cancer.

EXAM:
BILATERAL BREAST MRI WITH AND WITHOUT CONTRAST
TECHNIQUE: Multiplanar, multisequence MR images of both breasts were obtained
prior to and following the intravenous administration of 6 ml of
Gadavist

[Series 2: t2_tirm_tra ipat (a-p) · axial · 3.0mm · 0.70mm/px · 1 of 50 slices shown]
[im 1/50]
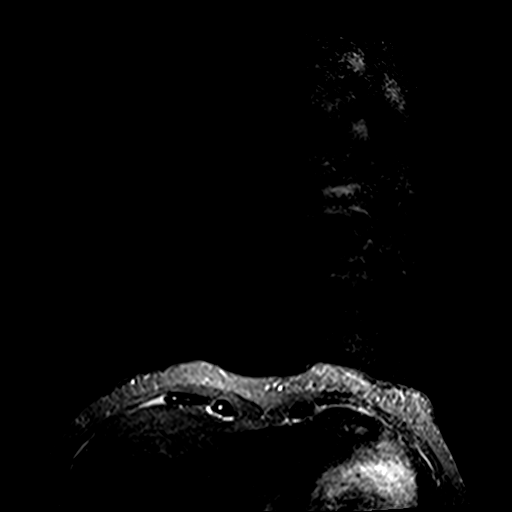

[Series 3: fl3d pre-cm no · axial · non-contrast · 1.2mm · 0.94mm/px · z∈[-81,+71]mm · 5 of 128 slices shown]
[im 1/128]
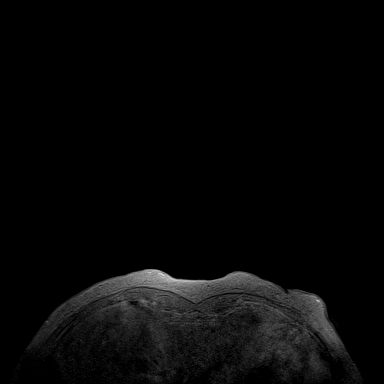
[im 32/128]
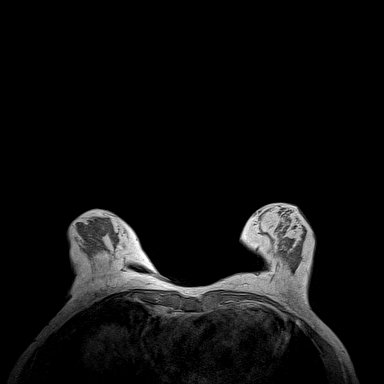
[im 64/128]
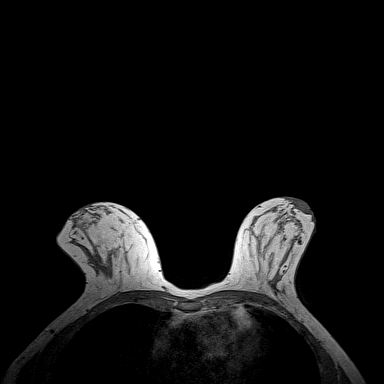
[im 96/128]
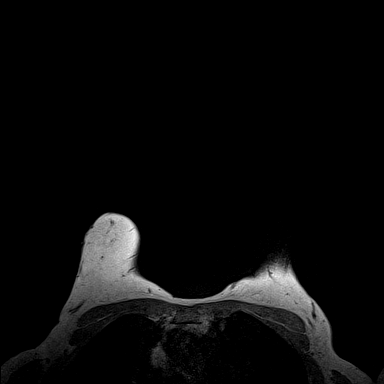
[im 128/128]
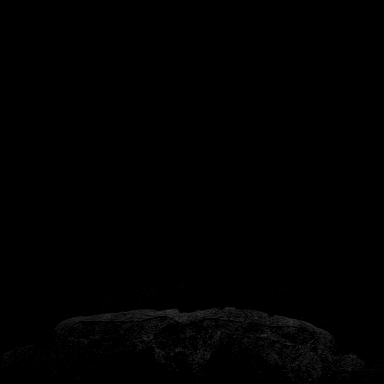

[Series 4: fl3d pre-cm · axial · non-contrast · 1.2mm · 0.94mm/px · z∈[-81,+71]mm · 5 of 128 slices shown]
[im 1/128]
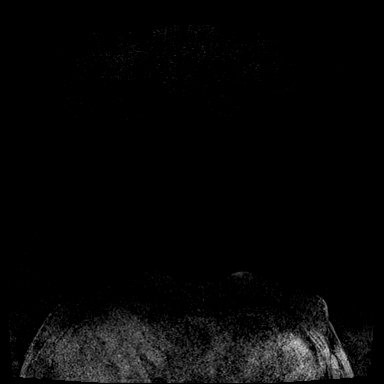
[im 32/128]
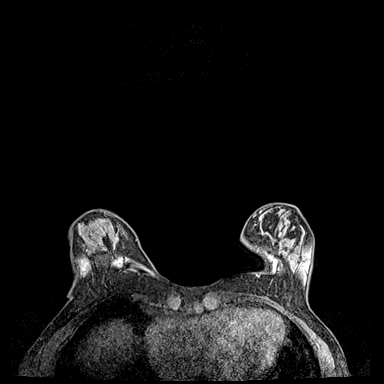
[im 64/128]
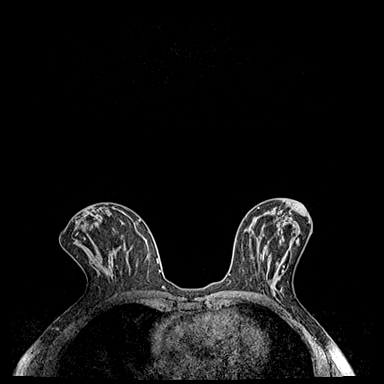
[im 96/128]
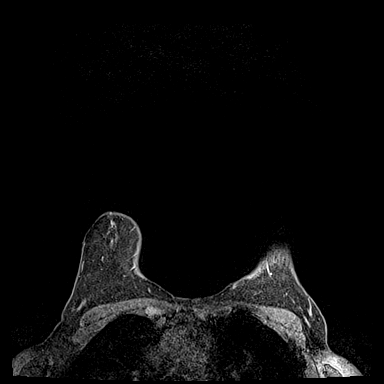
[im 128/128]
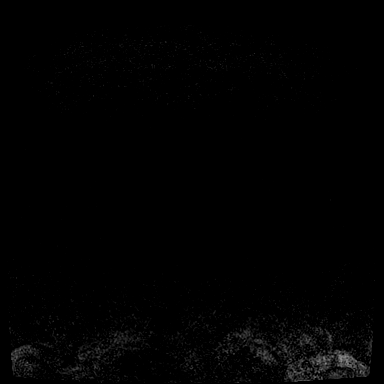

[Series 5: fl3d post-cm 20 · axial · 1.2mm · 0.94mm/px · z∈[-81,+71]mm · 5 of 128 slices shown (1 of 3)]
[im 1/128]
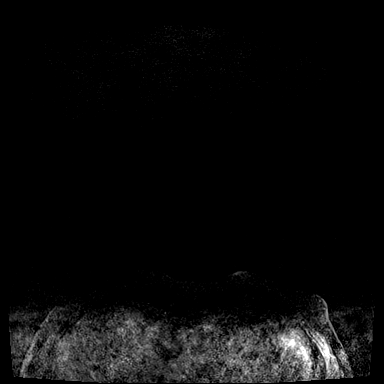
[im 32/128]
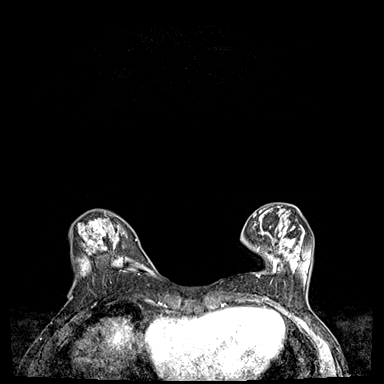
[im 64/128]
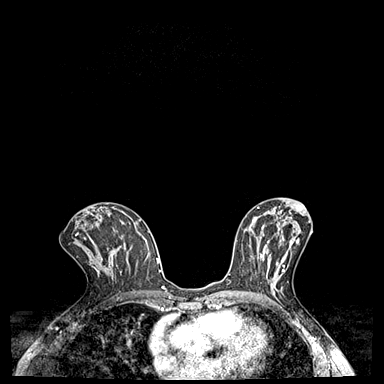
[im 96/128]
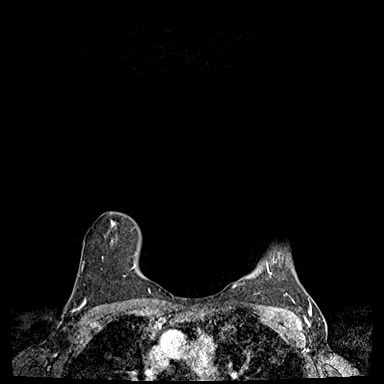
[im 128/128]
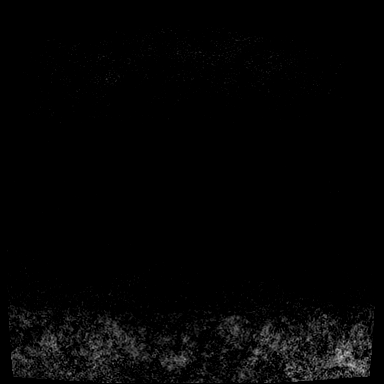

[Series 6: fl3d post-cm 20 · axial · 1.2mm · 0.94mm/px · z∈[-81,+71]mm · 5 of 128 slices shown (2 of 3)]
[im 1/128]
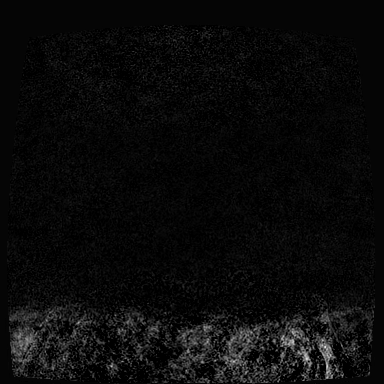
[im 32/128]
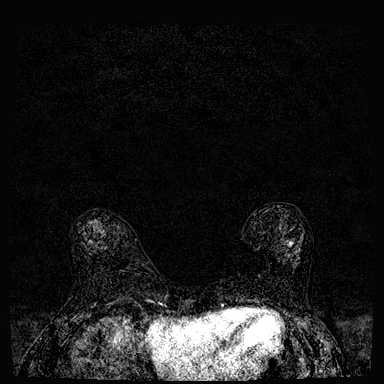
[im 64/128]
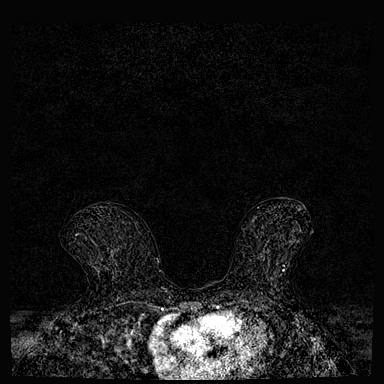
[im 96/128]
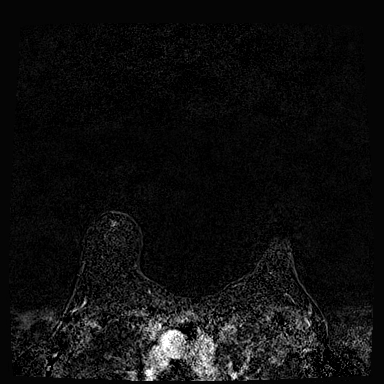
[im 128/128]
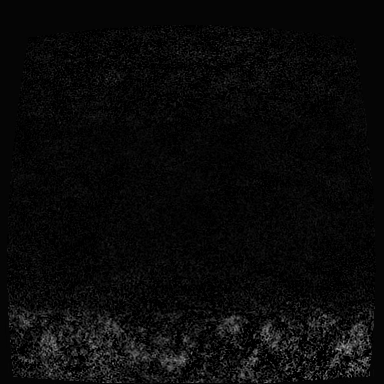

[Series 7: fl3d post-cm 20 · axial · 153.6mm · 0.94mm/px · 1 of 1 slices shown (3 of 3)]
[im 1/1]
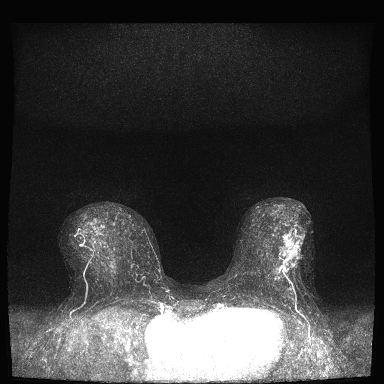

[Series 8: fl3d post-cm 3 · axial · 1.2mm · 0.94mm/px · z∈[-81,+71]mm · 5 of 128 slices shown (1 of 2)]
[im 1/128]
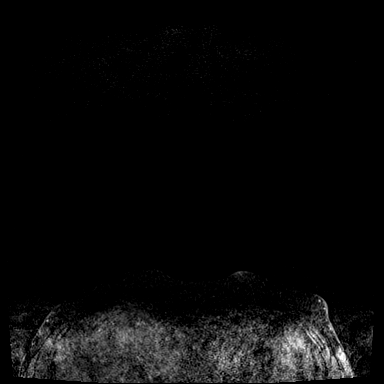
[im 32/128]
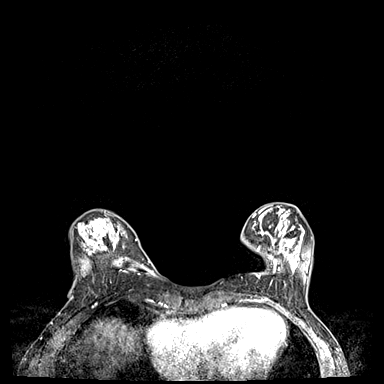
[im 64/128]
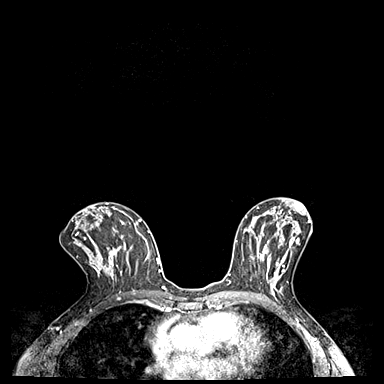
[im 96/128]
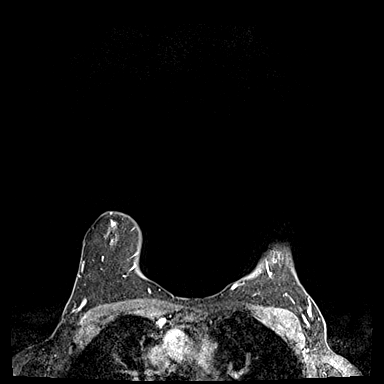
[im 128/128]
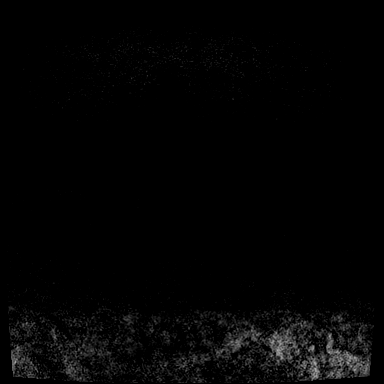

[Series 9: fl3d post-cm 3 · axial · 1.2mm · 0.94mm/px · z∈[-81,+71]mm · 5 of 128 slices shown (2 of 2)]
[im 1/128]
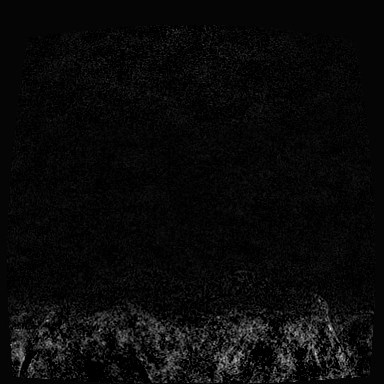
[im 32/128]
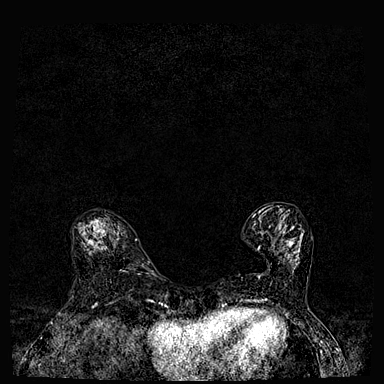
[im 64/128]
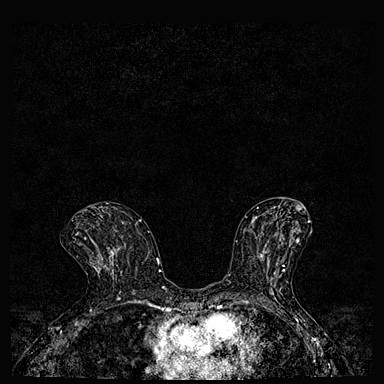
[im 96/128]
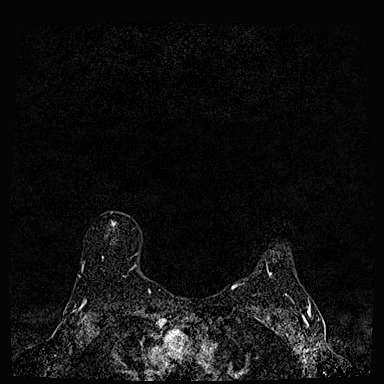
[im 128/128]
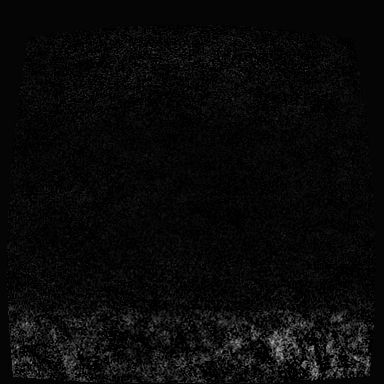

[32 of 48 positions shown; findings below may reference images not displayed]

Three-dimensional MR images were rendered by post-processing of the
original MR data on an independent workstation. The
three-dimensional MR images were interpreted, and findings are
reported in the following complete MRI report for this study. Three
dimensional images were evaluated at the independent interpreting
workstation using the DynaCAD thin client.
Comparison is made to outside
mammograms, including most recent mammogram dated [DATE].
FINDINGS: Breast composition: d. Extreme fibroglandular tissue.

Background parenchymal enhancement: Moderate.

Right breast: No suspicious enhancing mass, suspicious non-mass
enhancement or secondary signs of malignancy within the RIGHT
breast.

Left breast: Clumped non-mass enhancement within the lower outer
quadrant of the LEFT breast, measuring approximately 6 cm AP
dimension, up to 2 cm transverse dimension and 3.7 cm craniocaudal
dimension, with mixed persistent and plateau enhancement kinetics
(series 9, images 82 through 94).

No additional suspicious enhancing mass, suspicious non-mass
enhancement or secondary signs of malignancy elsewhere within the
LEFT breast.

Lymph nodes: No abnormal appearing lymph nodes.

Ancillary findings:  None.
IMPRESSION: 1. Clumped non-mass enhancement throughout a significant portion of
the lower outer quadrant of the LEFT breast, measuring up to 6 cm
greatest dimension, with mixed persistent and plateau enhancement
kinetics. This may represent benign fibrocystic change or asymmetric
enhancement of normal dense fibroglandular tissue. MRI-guided biopsy
is recommended to exclude malignancy with targeting of the most
anterior-inferior and posterior-superior aspects that are accessible
with MRI guidance.
2. No evidence of malignancy within the RIGHT breast.

RECOMMENDATION:
MRI-guided biopsies (2 sites) for the clumped non-mass enhancement
located within the lower outer quadrant of the LEFT breast,
measuring up to 6 cm greatest dimension, with targeting of the most
anterior-inferior and posterior-superior aspects of the non-mass
enhancement that are accessible with MRI guidance (possible biopsy
sites at series 9, image 94 and series 9, image 82).

BI-RADS CATEGORY  4: Suspicious.

## 2022-01-12 MED ORDER — GADOBUTROL 1 MMOL/ML IV SOLN
6.0000 mL | Freq: Once | INTRAVENOUS | Status: AC | PRN
  Administered 2022-01-12: 6 mL via INTRAVENOUS

## 2022-01-17 ENCOUNTER — Other Ambulatory Visit: Payer: Self-pay

## 2022-01-17 ENCOUNTER — Inpatient Hospital Stay: Payer: Managed Care, Other (non HMO)

## 2022-01-17 ENCOUNTER — Inpatient Hospital Stay: Payer: Managed Care, Other (non HMO) | Attending: Genetic Counselor | Admitting: Genetic Counselor

## 2022-01-17 DIAGNOSIS — Z1509 Genetic susceptibility to other malignant neoplasm: Secondary | ICD-10-CM | POA: Diagnosis not present

## 2022-01-17 DIAGNOSIS — Z1589 Genetic susceptibility to other disease: Secondary | ICD-10-CM | POA: Diagnosis not present

## 2022-01-17 DIAGNOSIS — Z1501 Genetic susceptibility to malignant neoplasm of breast: Secondary | ICD-10-CM | POA: Diagnosis not present

## 2022-01-17 DIAGNOSIS — Z803 Family history of malignant neoplasm of breast: Secondary | ICD-10-CM

## 2022-01-17 DIAGNOSIS — Z1502 Genetic susceptibility to malignant neoplasm of ovary: Secondary | ICD-10-CM

## 2022-01-18 ENCOUNTER — Encounter: Payer: Self-pay | Admitting: Genetic Counselor

## 2022-01-18 DIAGNOSIS — Z1501 Genetic susceptibility to malignant neoplasm of breast: Secondary | ICD-10-CM | POA: Insufficient documentation

## 2022-01-18 DIAGNOSIS — Z803 Family history of malignant neoplasm of breast: Secondary | ICD-10-CM | POA: Insufficient documentation

## 2022-01-18 NOTE — Progress Notes (Signed)
REFERRING PROVIDER: Rolm Bookbinder, MD [place],  [place] [address]  PRIMARY PROVIDER:  Marda Stalker, PA-C  PRIMARY REASON FOR VISIT:  1. Family history of breast cancer   2. Monoallelic mutation of CHEK2 gene in female patient      HISTORY OF PRESENT ILLNESS:   Adrienne Williams, a 41 y.o. female, was seen for a [place] cancer genetics consultation at the request of Dr. Donne Hazel due to a personal and family history of cancer.  Adrienne Williams presents to clinic today to discuss the possibility of a hereditary predisposition to cancer, genetic testing, and to further clarify her future cancer risks, as well as potential cancer risks for family members.   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  The treatment plan was simply removal of the melanoma and surveillance.      CANCER HISTORY:  Oncology History   No history exists.     RISK FACTORS:  Menarche was at age 38.  First live birth at age 39.  OCP use for approximately  10+  years.  Ovaries intact: yes.  Hysterectomy: no.  Menopausal status: premenopausal.  HRT use: 0 years. Colonoscopy: no; not examined. Mammogram within the last year: yes. Number of breast biopsies:  scheduled for a bx next week . Up to date with pelvic exams: yes. Any excessive radiation exposure in the past: no  Past Medical History:  Diagnosis Date   Anxiety    Asthma    Depression    Family history of breast cancer    Frank breech presentation 05/29/2018   Hemorrhoids    Hx of varicella    PONV (postoperative nausea and vomiting)    "little nauseated"   Postpartum care following cesarean delivery (6/14) 01/18/2016    Past Surgical History:  Procedure Laterality Date   CESAREAN SECTION N/A 01/18/2016   Procedure: Primary CESAREAN SECTION;  Surgeon: Princess Bruins, MD;  Location: [facility];  Service: Obstetrics;  Laterality: N/A;  EDD: 01/25/16   CESAREAN SECTION N/A  05/29/2018   Procedure: Repeat CESAREAN SECTION;  Surgeon: Azucena Fallen, MD;  Location: [facility];  Service: Obstetrics;  Laterality: N/A;  EDD: 06/10/18   CYSTECTOMY  2002   pilonidal region   DIAGNOSTIC LAPAROSCOPY WITH REMOVAL OF ECTOPIC PREGNANCY Right 05/29/2020   Procedure: DIAGNOSTIC LAPAROSCOPY WITH REMOVAL OF ECTOPIC PREGNANCY;  Surgeon: Azucena Fallen, MD;  Location: [facility];  Service: Gynecology;  Laterality: Right;   MOLE REMOVAL     UNILATERAL SALPINGECTOMY Right 05/29/2020   Procedure: UNILATERAL SALPINGECTOMY;  Surgeon: Azucena Fallen, MD;  Location: [facility];  Service: Gynecology;  Laterality: Right;   WISDOM TOOTH EXTRACTION      Social History   Socioeconomic History   Marital status: Married    Spouse name: Not on file   Number of children: 2   Years of education: Not on file   Highest education level: Not on file  Occupational History   Occupation: Pharmacist  Tobacco Use   Smoking status: Never   Smokeless tobacco: Never  Vaping Use   Vaping Use: Never used  Substance and Sexual Activity   Alcohol use: No   Drug use: No   Sexual activity: Yes  Other Topics Concern   Not on file  Social History Narrative   Not on file   Social Determinants of Health   Financial Resource Strain: Low Risk  (05/26/2018)   Overall Emergency planning/management officer Strain (  CARDIA)    Difficulty of Paying Living Expenses: Not hard at all  Food Insecurity: No Food Insecurity (05/26/2018)   Hunger Vital Sign    Worried About Running Out of Food in the Last Year: Never true    Ran Out of Food in the Last Year: Never true  Transportation Needs: Unknown (05/26/2018)   PRAPARE - Hydrologist (Medical): No    Lack of Transportation (Non-Medical): Not on file  Physical Activity: Not on file  Stress: Stress Concern Present (05/26/2018)   [place]    Feeling of Stress : To some extent  Social  Connections: Not on file     FAMILY HISTORY:  We obtained a detailed, 4-generation family history.  Significant diagnoses are listed below: Family History  Problem Relation Age of Onset   ADD / ADHD Brother    Breast cancer Maternal Grandmother 35   Cancer Maternal Grandmother        stomach, possible met from breast cancer   Hypothyroidism Paternal Grandmother    Atrial fibrillation Paternal Grandmother    Angina Paternal Grandfather    Hypertension Paternal Grandfather    Hyperlipidemia Paternal Grandfather    Colon polyps Paternal Grandfather    Colon cancer Neg Hx    Esophageal cancer Neg Hx      The patient has two daughters who are cancer free.  She is an only child to both parents but has a paternal half brother who is cancer free.  Her father is living.  Her mother is a missing person for 39 years.  The patient's mother was an only child.  Her mother had breast cancer in her 13s.  Her father died of non-cancer related issues.  The patient's father is living and cancer free.  He has a brother who does not have cancer.  The paternal grandparents are deceased.  [name] is unaware of previous family history of genetic testing for hereditary cancer risks. Patient's maternal ancestors are of [place] descent, and paternal ancestors are of [place] descent. There is no reported Ashkenazi Jewish ancestry. There is no known consanguinity.  GENETIC COUNSELING ASSESSMENT: Adrienne Williams is a 41 y.o. female with a personal and family history of cancer.  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 We, therefore, discussed and recommended the following at today's visit.   DISCUSSION: CHEK2 mutations have been found to be associated with an increased risk of breast and other cancers. The estimated cancer risks vary widely and may be influenced by family  history. Women with a CHEK2 deleterious mutation have approximately a 24% (no family history of breast cancer) to 48% (strong family history of breast cancer) lifetime risk of breast cancer and up to a 25% risk of a second breast cancer. Men may have an increased risk for female breast cancer of about 1%. Men and women may have an increased risk of colon cancer (~10-14% lifetime risk). According to the NCCN guidelines, individuals with CHEK2 mutations should consider breast MRI's as a part of regular breast cancer screening, and depending on family history could consider a risk-reducing mastectomy.  Breast cancer screening should begin, for women, at age 40 or 62 years younger than the earliest age of onset.  Colon cancer screening should begin at age 78 and continue every 5 years or  based on polyp number.    An individual's cancer risk and medical management are not determined by genetic test results alone. Overall cancer risk assessment incorporates additional factors, including personal medical history, family history, and any available genetic information that may result in a personalized plan for cancer prevention and surveillance.   CANCER SCREENING: Below are the NCCN Practice guidelines for women and men.  However, because the breast cancer risks for women and prostate cancer risks for men may be similar, it is appropriate to consider these high risk management recommendations.  Breast Management Options We reviewed the NCCN practice guidelines (v1.2017) for breast management for women at an increased risk of breast cancer because of CHEK2 mutations:   Breast awareness (which may include periodic, consistent breast self exam) starting at age 35.  Breast screening:  Starting at age 97, annual mammogram, with breast MRI screening starting between ages 50-35 or starting 52 years younger than the earliest age of onset.  Evidence of risk-reducing mastectomy is insufficient at this time.  [place] based on  family history.  Colon Cancer Management:  Men and women with a deleterious CHEK2 mutation may have up to a 10-14% lifetime risk for colon cancer. The following is recommended for individuals with a CHEK2 mutation:  Personal history of colon cancer  Follow instructions provided by your physician based on your personal history.  Do not have a personal history of colon cancer but have a parent/sibling/child with colon cancer: Colonoscopy every 5 years starting at age 57 or 4 years younger than the earliest age of onset, whichever is younger.  Do not have a personal history of colon cancer and do not have a parent/sibling/child with colon cancer: Colonoscopy every 5 years starting at age 41.  Prostate Cancer Management: It has been suggested that men with a CHEK2 pathogenic variant and a first-degree relative with prostate cancer have an annual prostate-specific antigen (PSA) analysis (PMID: 03704888). However, the benefits of screening for prostate cancer among men with a pathogenic variant in CHEK2 are uncertain (PMID: 91694503).  We discussed that a federal law called the Genetic Information Non-Discrimination Act (GINA) of 2008 helps protect individuals against genetic discrimination based on their genetic test results.  It impacts both health insurance and employment.  With health insurance, it protects against increased premiums, being kicked off insurance or being forced to take a test in order to be insured.  For employment it protects against hiring, firing and promoting decisions based on genetic test results.  Health status due to a cancer diagnosis is not protected under GINA.   FAMILY MEMBERS: It is important that all of Adrienne Williams's relatives (both men and women) know of the presence of this gene mutation.  Women need to know that they may be at increased risk for breast and colon cancers.  Men are at slightly increased risk for breast, prostate and colon cancers.  Genetic testing can  sort out who in your family is at risk and who is not.  We would be happy to help meet with and coordinate genetic testing for any relative that is interested.  Adrienne Williams's children are at 50% risk to have inherited the mutation found in her. Adrienne Williams children, however, are relatively young and this will not be of any consequence to them for several years. We do not test children because there is no risk to them until they are adults.  It should also be kept in mind that for children, we are bound to  know a great deal more about breast cancer and its prevention in several years' time. We recommend that Adrienne Williams children have genetic counseling and testing by the time that they are in their early 20's.    Adrienne Williams's paternal half brother is at 25% risk to have inherited the mutation found in her. We recommend he have genetic testing for this same mutation, as identifying the presence of this mutation would allow them to also take advantage of risk-reducing measures.   Individuals with two copies of the CHEK2 1 variant (i.e., those who are homozygous) have a higher breast cancer risk compared with those with a single copy (i.e., those who are heterozygous). The lifetime risk of breast cancer in individuals homozygous for this variant is estimated to be increased four to six fold. The estimates of other cancer risks among homozygotes or compound heterozygotes involving other variants is unclear.  Our knowledge of cancer risks related to CHEK2 mutations will continue to evolve. We recommended that Adrienne Williams follow up with the genetics clinic annually so we can provide her with the most current information about CHEK2 and cancer risk, as well as with any changes to her family history (new cancer diagnoses, genetic test results).  SUPPORT AND RESOURCES:  We provided information about two support groups for hereditary cancer syndrome information and support, Facing Our Risk  (www.facingourrisk.com) and Bright Pink (www.brightpink.org) which some people have found useful.  They provide opportunities to speak with other individuals from high-risk families.    Our contact number was provided. Adrienne Williams questions were answered to her satisfaction, and she knows she is welcome to call us at anytime with additional questions or concerns.   Roma Kayser, [place], [facility] Licensed, Certified Genetic Counselor [email or web address] phone: [phone]  The patient was seen for a total of 40 minutes in face-to-face genetic counseling.

## 2022-01-26 ENCOUNTER — Ambulatory Visit
Admission: RE | Admit: 2022-01-26 | Discharge: 2022-01-26 | Disposition: A | Discharge status: Home / Self Care | Payer: Managed Care, Other (non HMO) | Source: Ambulatory Visit | Attending: General Surgery | Admitting: General Surgery

## 2022-01-26 DIAGNOSIS — Z1501 Genetic susceptibility to malignant neoplasm of breast: Secondary | ICD-10-CM

## 2022-01-26 DIAGNOSIS — R9389 Abnormal findings on diagnostic imaging of other specified body structures: Secondary | ICD-10-CM

## 2022-01-26 DIAGNOSIS — Z803 Family history of malignant neoplasm of breast: Secondary | ICD-10-CM

## 2022-01-26 IMAGING — MG MM BREAST LOCALIZATION CLIP
4 series · 4 of 12 positions shown · non-contrast
Comparison: Previous exam(s).

CLINICAL DATA: Confirmation of clip placement after MRI guided core
needle biopsy of non-mass enhancement involving the LOWER OUTER
QUADRANT of the LEFT breast.

EXAM:
2D and 3D DIAGNOSTIC LEFT MAMMOGRAM POST MRI BIOPSY

[L CC synth-2D]
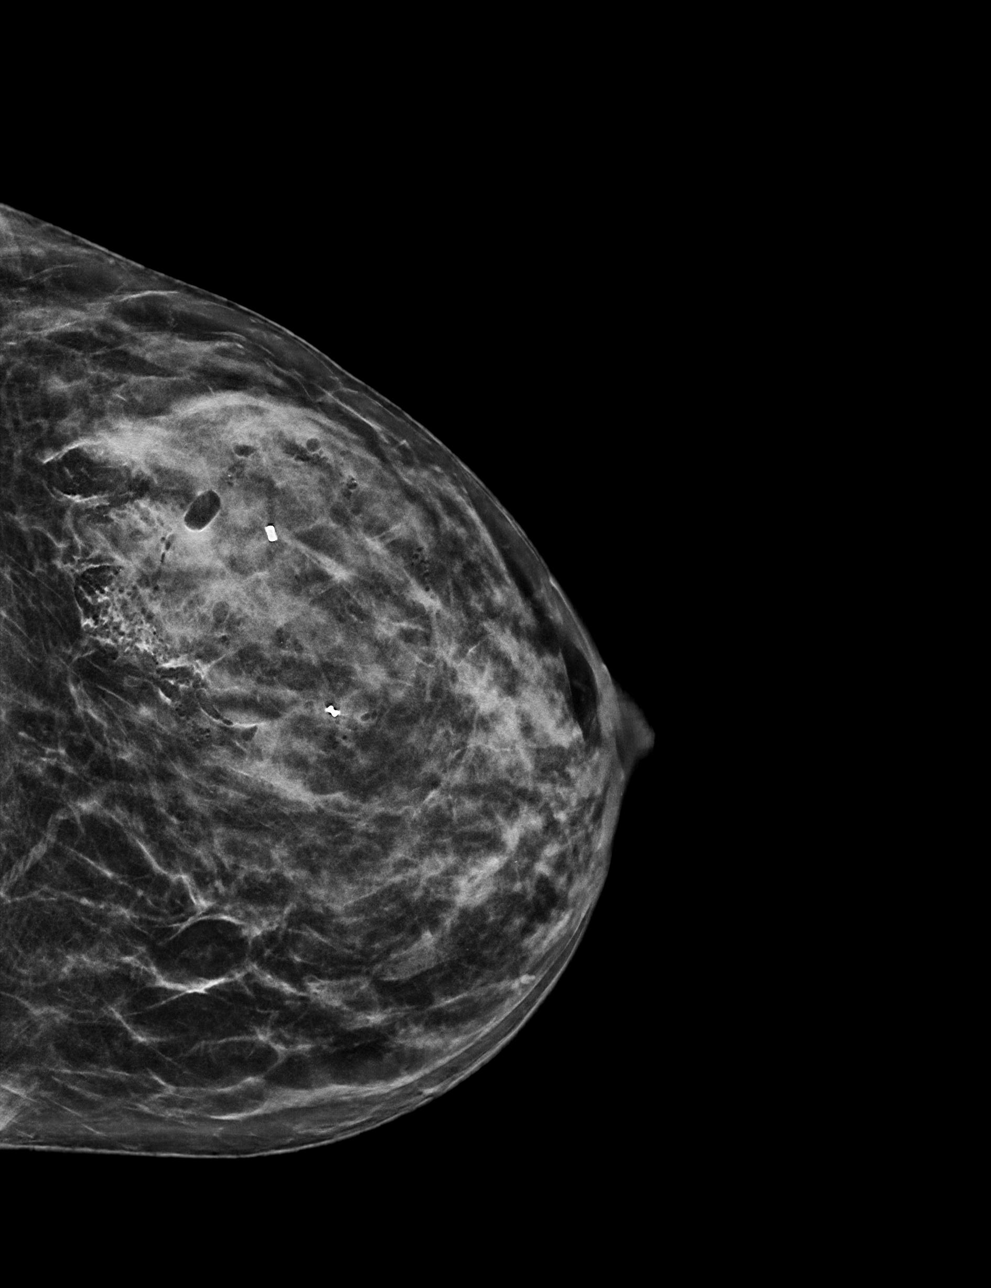

[L ML synth-2D]
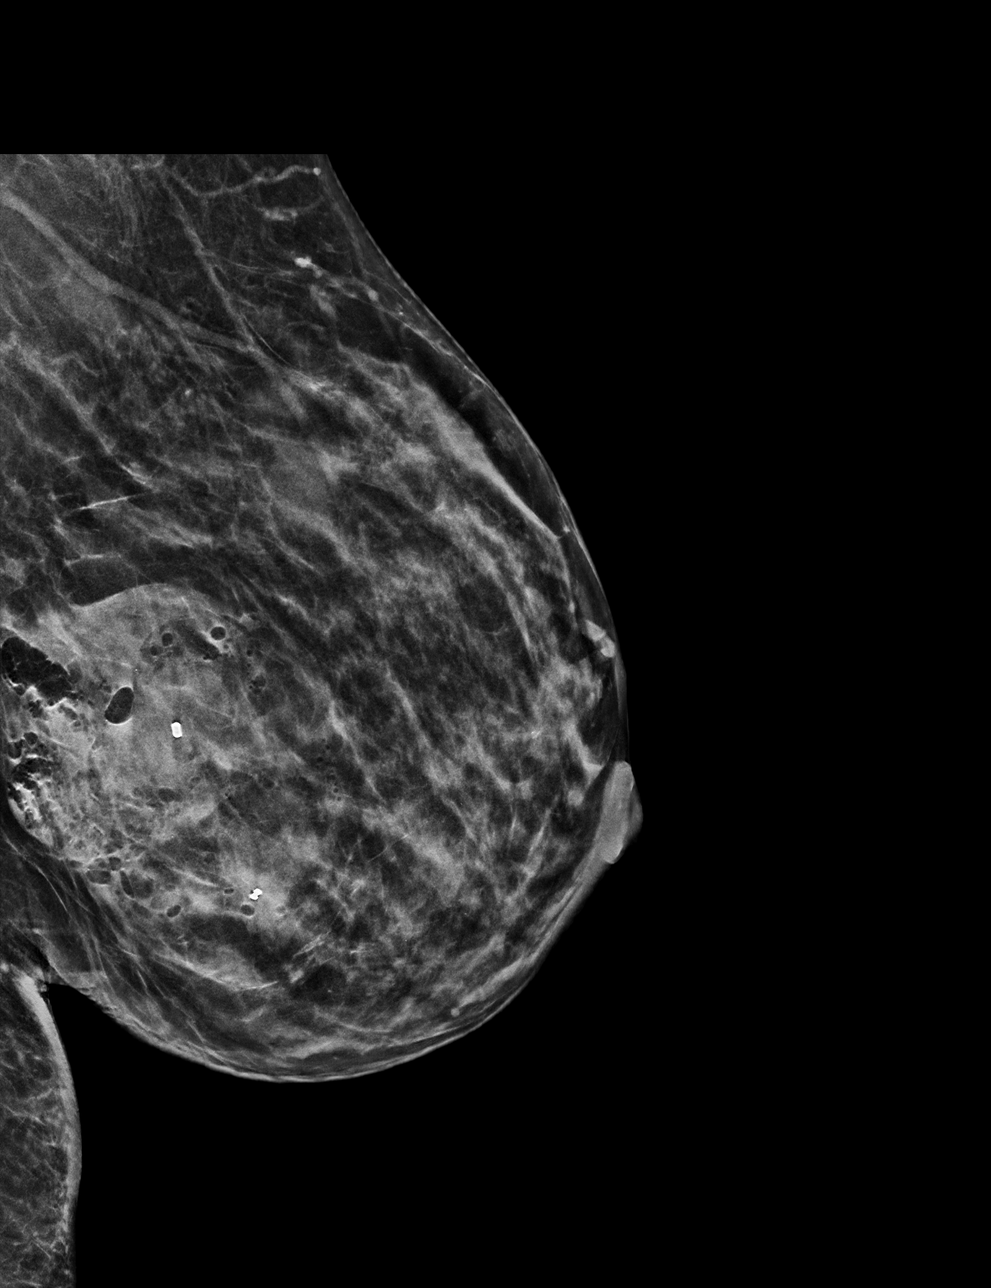

[L ML tomo · tomo slice 25/50.0]
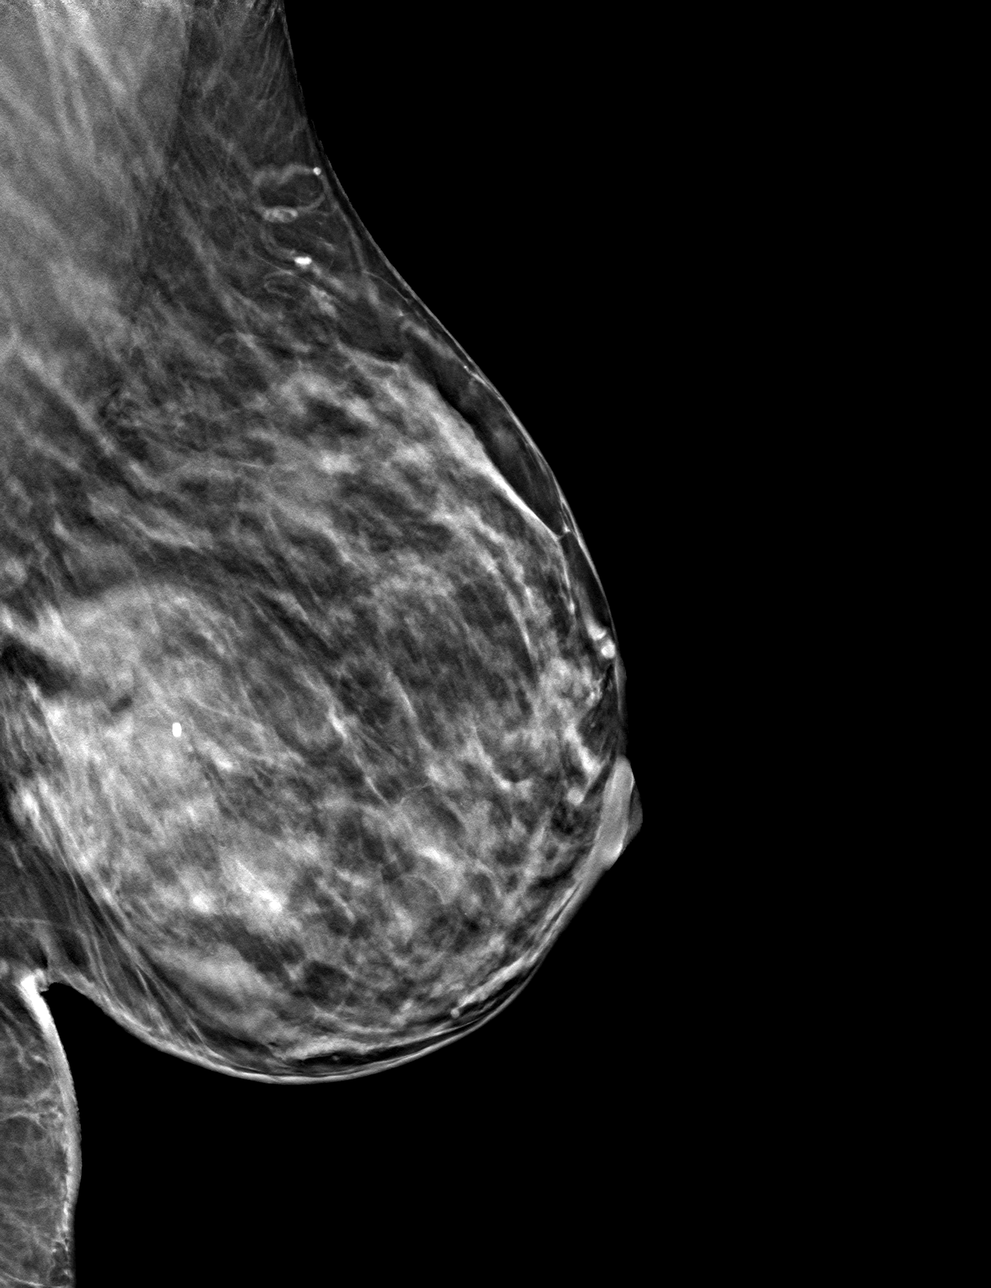

[L CC tomo · tomo slice 25/50.0]
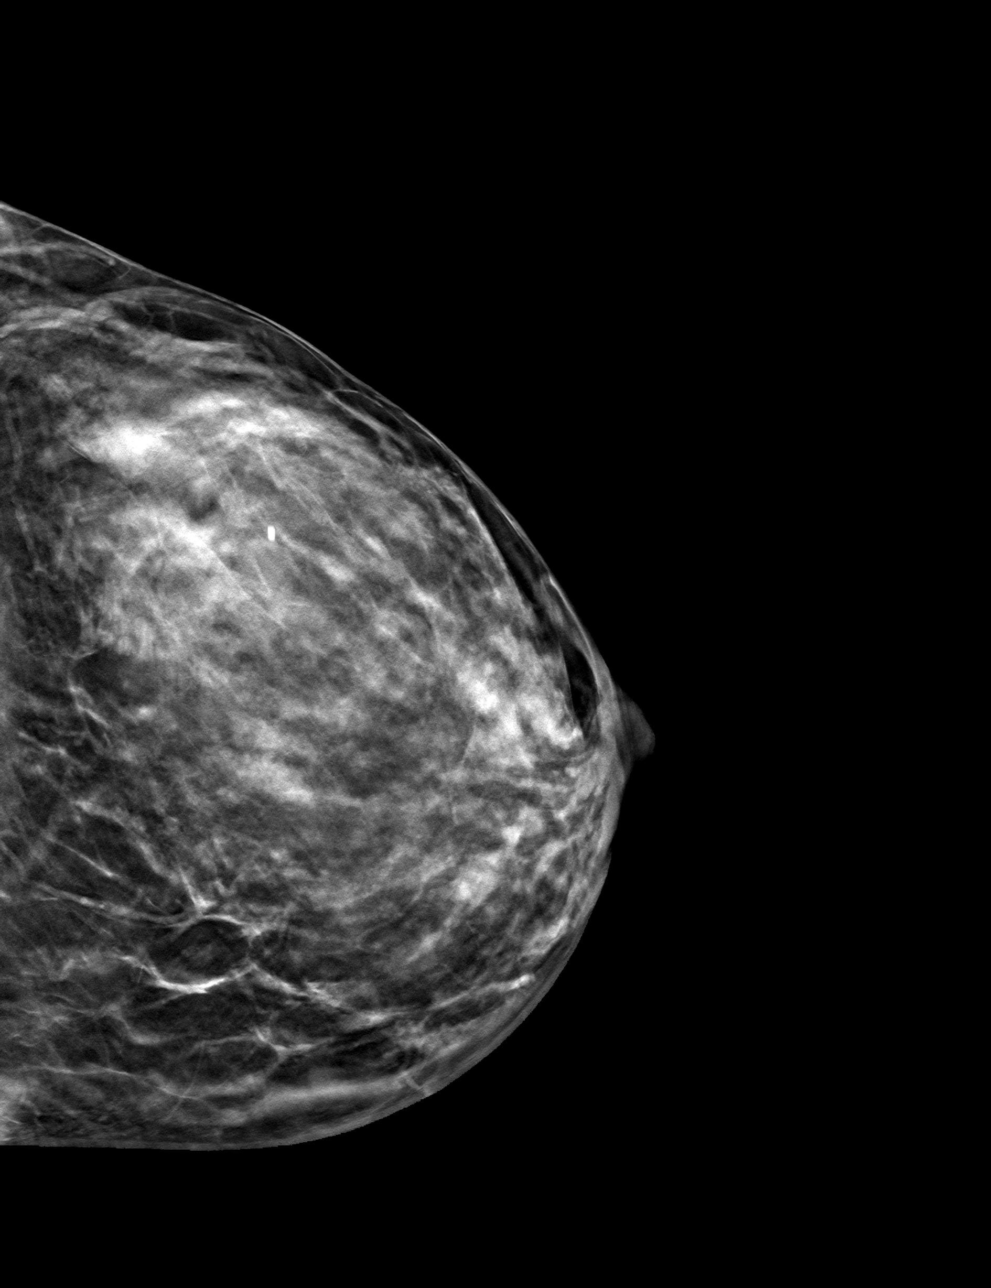

[4 of 12 positions shown; findings below may reference images not displayed]

FINDINGS: 2D and 3D full field CC and mediolateral images were obtained
following MRI guided biopsy of the anterior and posterior extent of
non-mass enhancement involving the LOWER OUTER QUADRANT of the LEFT
breast which spans 6 cm.

The hourglass shaped tissue marking clip is appropriately positioned
at the site of the anterior extent of the non-mass enhancement in
the LOWER OUTER QUADRANT at middle depth.

The cylinder shaped tissue marking clip is appropriately positioned
at the site of the biopsied non-mass enhancement in the LOWER OUTER
QUADRANT at middle to posterior depth; the site of biopsy was as far
posterior as allowed by the MRI biopsy device, but there is non-mass
enhancement which extends approximately 2.5 cm posterior to the
biopsy site.

The clips are separated by approximately 3 cm.

There is a small (approximate 3 cm) hematoma at the more posterior
biopsy site associated with the cylinder clip. Expected post biopsy
changes are present at the anterior site associated with the
hourglass clip.
IMPRESSION: 1. Appropriate positioning of the hourglass shaped biopsy marking
clip at the site of the anterior extent of the biopsied non-mass
enhancement in the LOWER OUTER QUADRANT at middle depth.
2. Appropriate positioning of the cylinder shaped biopsy marking
clip at the site of the posterior extent of the biopsied non-mass
enhancement in the LOWER OUTER QUADRANT at middle to posterior
depth.
3. Non-mass enhancement extends approximately 2.5 cm posterior to
the biopsy site associated with the cylinder clip.
4. The clips are separated by approximately 3 cm.
5. Small (approximate 3 cm) hematoma at the more posterior biopsy
site associated with the cylinder clip.

Final Assessment: Post Procedure Mammograms for Marker Placement

## 2022-01-26 IMAGING — MR MR BREAST BX W LOC DEV 1ST LESION IMAGE BX SPEC MR GUIDE*L*
8 of 10 series · 34 of 48 positions shown · IV contrast (gadavist)
Comparison: Breast MRI [DATE].

CLINICAL DATA: 40-year-old with a [HJ] gene mutation and family
history of breast cancer. Recent high risk screening MRI
demonstrated non-mass enhancement involving the LOWER OUTER QUADRANT
of the LEFT breast at middle to posterior depth spanning 6 cm. The
anterior and posterior extent of the enhancement is sampled.

Of note, the patient's area of densest fibroglandular tissue on
mammography is in the LOWER OUTER QUADRANT at middle to posterior
depth.
EXAM:
MRI GUIDED CORE NEEDLE BIOPSY OF THE LEFT BREAST x 2
TECHNIQUE: Multiplanar, multisequence MR imaging of the LEFT breast was
performed both before and after administration of intravenous
contrast.
CONTRAST:  6 mL Gadavist IV.

[Series 2: fiducial unilateral · sagittal · 2.0mm · 1.33mm/px · 3 of 52 slices shown]
[im 1/52]
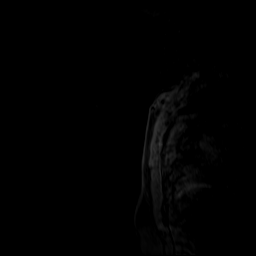
[im 26/52]
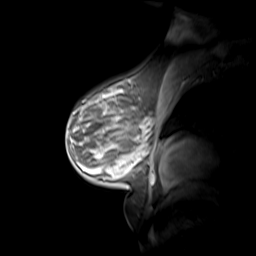
[im 52/52]
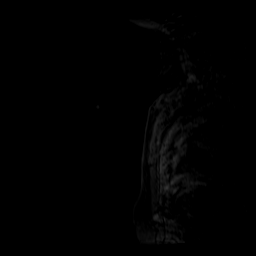

[Series 3: dynamic pre · axial · non-contrast · 1.3mm · 0.73mm/px · z∈[-88,+98]mm · 5 of 144 slices shown]
[im 1/144]
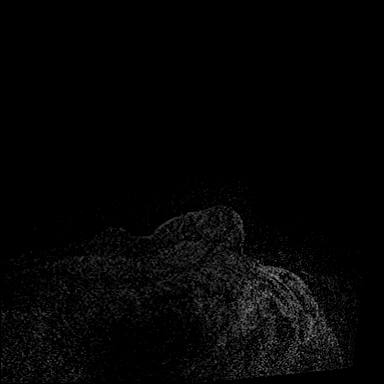
[im 36/144]
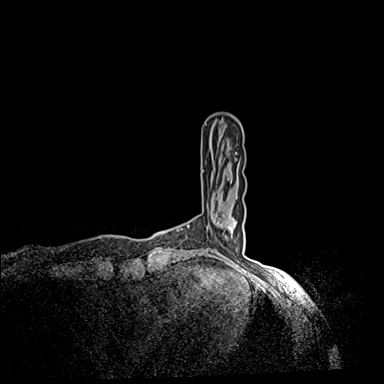
[im 72/144]
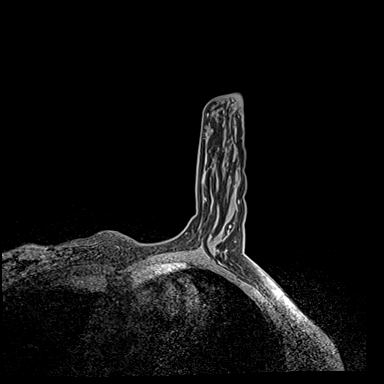
[im 108/144]
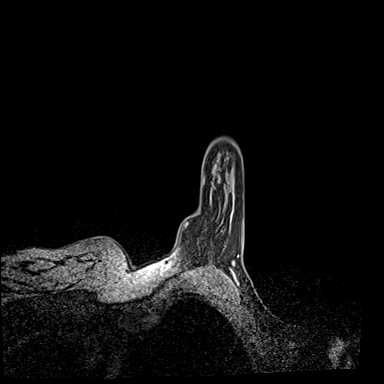
[im 144/144]
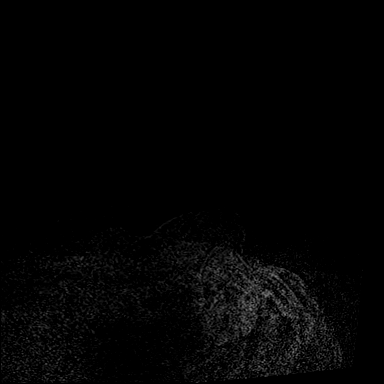

[Series 4: dynamic post 20 · axial · 1.3mm · 0.73mm/px · z∈[-88,+98]mm · 5 of 144 slices shown (1 of 2)]
[im 1/144]
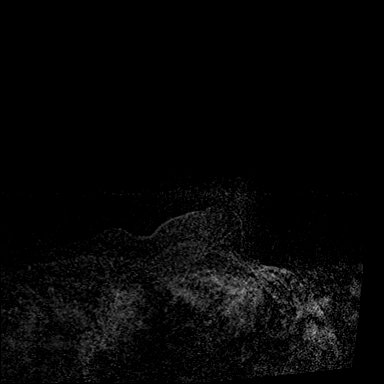
[im 36/144]
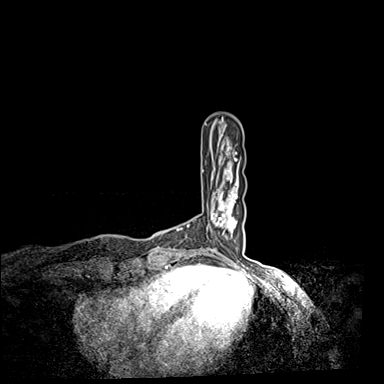
[im 72/144]
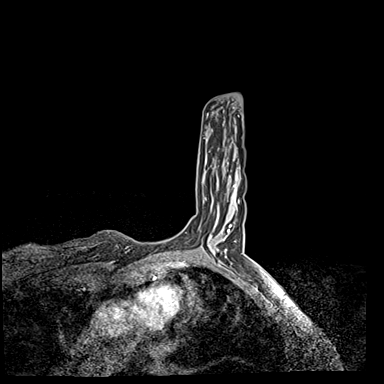
[im 108/144]
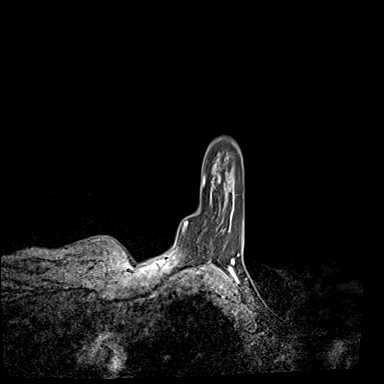
[im 144/144]
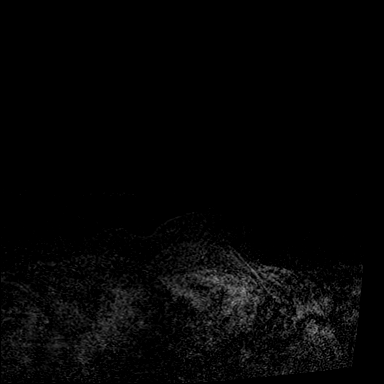

[Series 5: dynamic post 20 · axial · 1.3mm · 0.73mm/px · z∈[-88,+98]mm · 5 of 144 slices shown (2 of 2)]
[im 1/144]
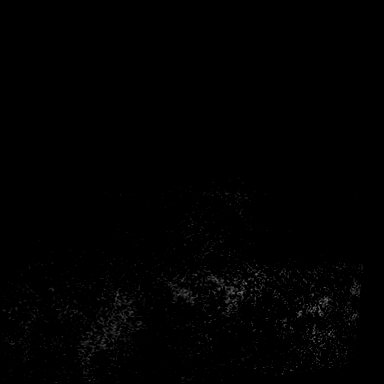
[im 36/144]
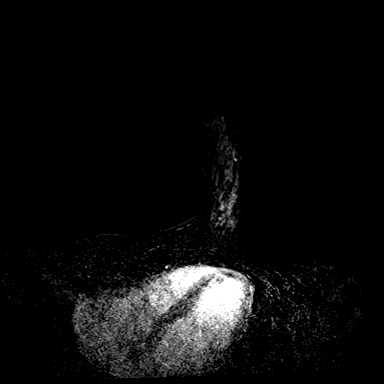
[im 72/144]
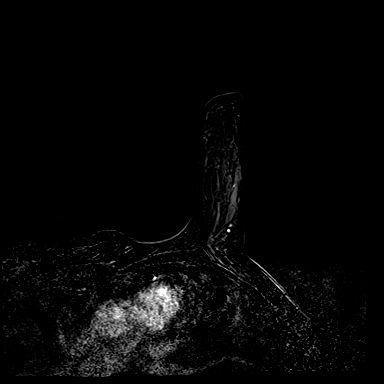
[im 108/144]
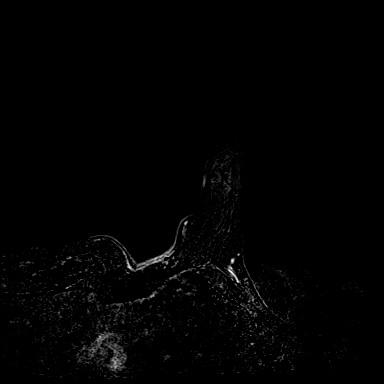
[im 144/144]
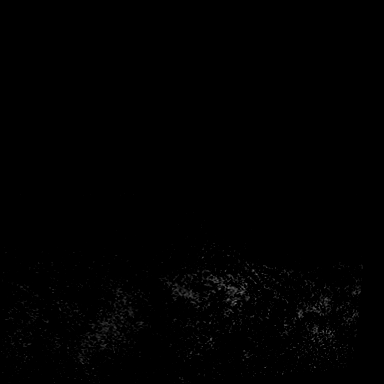

[Series 6: dynamic post 3 · axial · 1.3mm · 0.73mm/px · z∈[-88,+98]mm · 5 of 144 slices shown (1 of 2)]
[im 1/144]
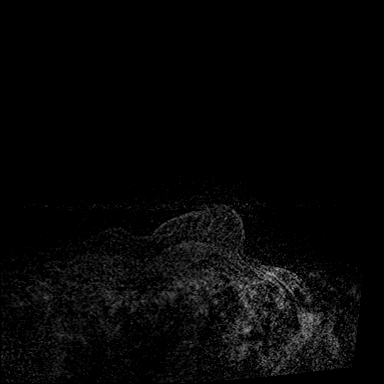
[im 36/144]
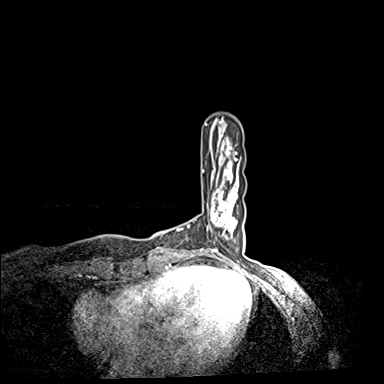
[im 72/144]
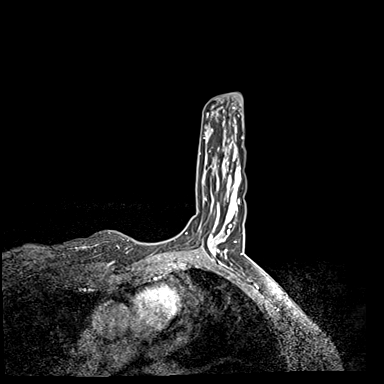
[im 108/144]
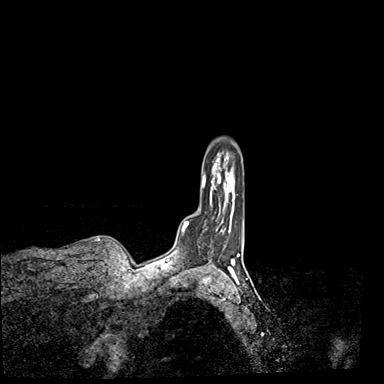
[im 144/144]
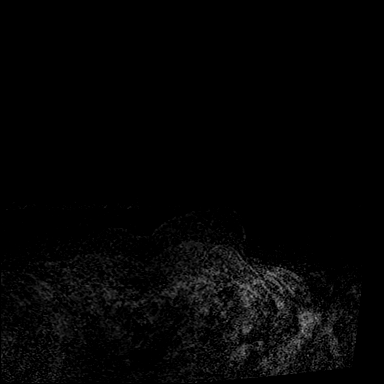

[Series 7: dynamic post 3 · axial · 1.3mm · 0.73mm/px · z∈[-88,+98]mm · 5 of 144 slices shown (2 of 2)]
[im 1/144]
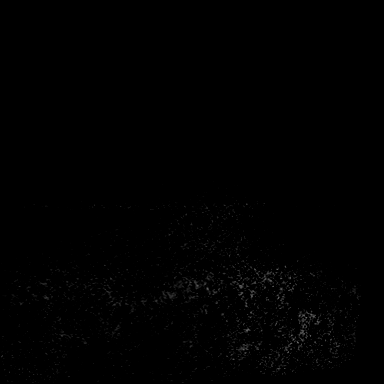
[im 36/144]
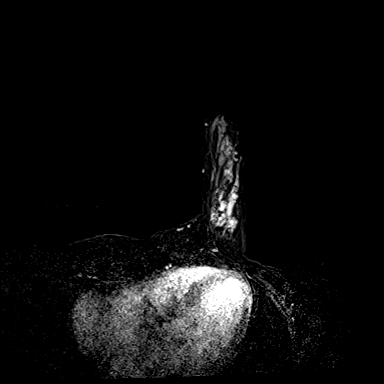
[im 72/144]
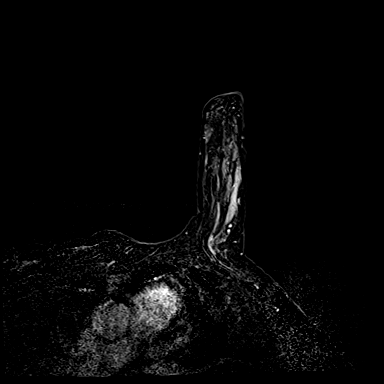
[im 108/144]
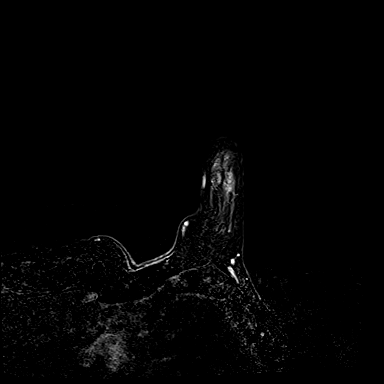
[im 144/144]
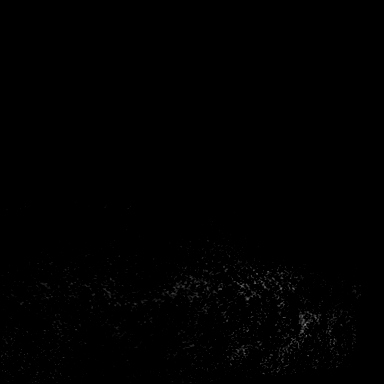

[Series 8: needle confirmation · axial · 1.3mm · 0.73mm/px · z∈[-88,+98]mm · 5 of 144 slices shown]
[im 1/144]
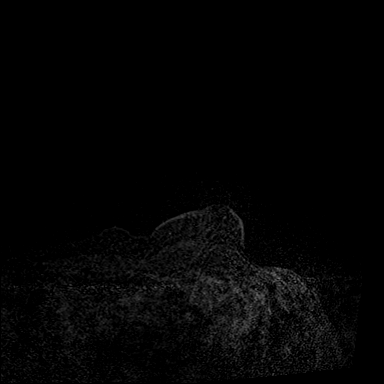
[im 36/144]
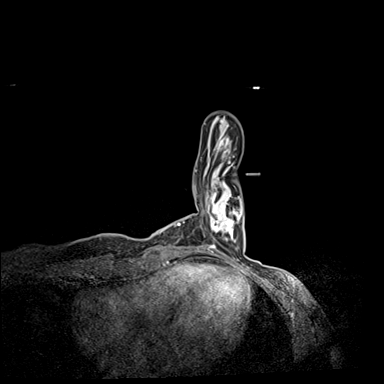
[im 72/144]
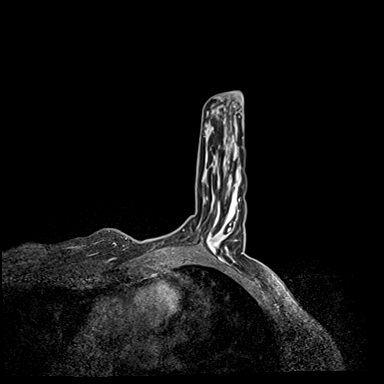
[im 108/144]
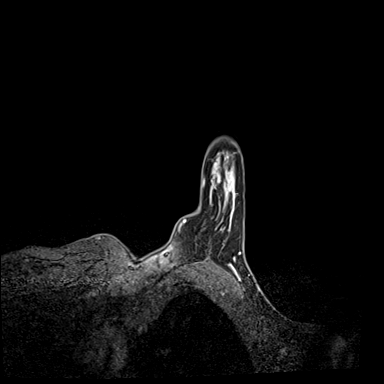
[im 144/144]
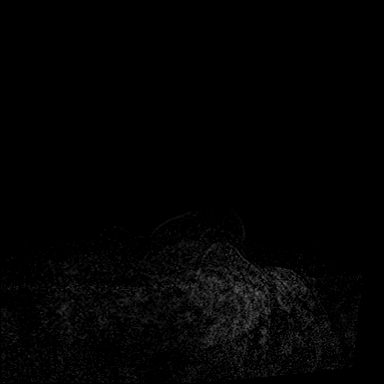

[Series 9: needle confirmation_sub · axial · 1.3mm · 0.73mm/px · 1 of 144 slices shown]
[im 1/144]
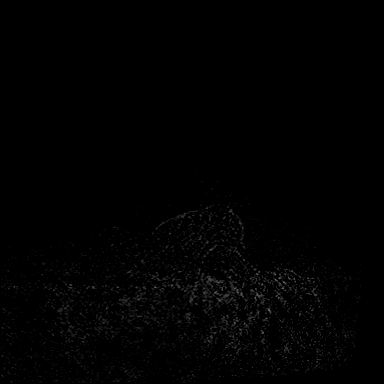

[34 of 48 positions shown; findings below may reference images not displayed]

Prior mammograms from [facility], most recently screening mammography [DATE].
FINDINGS: I met with the patient, and we discussed the procedure of MRI guided
biopsy, including risks, benefits, and alternatives. Specifically,
we discussed the risks of infection, bleeding, tissue injury, clip
migration, and inadequate sampling. Informed, written consent was
given. The usual time out protocol was performed immediately prior
to the procedure.

# 1) Anterior extent, lesion quadrant: LOWER OUTER QUADRANT.

Using sterile technique with chlorhexidine as skin antisepsis, 1%
lidocaine and 1% lidocaine with epinephrine as local anesthetic,
using MRI guidance, a 9 gauge Eviva vacuum assisted device was used
to perform biopsy of the anterior extent of the non-mass enhancement
using a lateral approach. At the conclusion of the procedure, an
hourglass shaped tissue marker clip was deployed into the biopsy
cavity.

# 2) Posterior extent, lesion quadrant: LOWER OUTER QUADRANT.

Using sterile technique with chlorhexidine as skin antisepsis, 1%
lidocaine and 1% lidocaine with epinephrine as local anesthetic,
using MRI guidance, a 9 gauge Eviva vacuum assisted device was used
perform biopsy of the posterior extent of the non-mass enhancement
using a lateral approach. At the conclusion of the procedure, a
cylinder shaped tissue marker clip was deployed into the biopsy
cavity.

Follow-up 2-view mammogram was performed and dictated separately.
IMPRESSION: MRI guided biopsy of the anterior and posterior extent of non-mass
enhancement involving the LOWER OUTER QUADRANT of the LEFT breast.

## 2022-01-26 MED ORDER — GADOBUTROL 1 MMOL/ML IV SOLN
6.0000 mL | Freq: Once | INTRAVENOUS | Status: AC | PRN
  Administered 2022-01-26: 6 mL via INTRAVENOUS

## 2022-01-29 ENCOUNTER — Telehealth: Payer: Managed Care, Other (non HMO) | Admitting: Gastroenterology

## 2022-01-29 NOTE — Telephone Encounter (Signed)
Case was denied by [employer] after reviewing clinical information, the denial rationale is listed below.  We cannot approve this request, your healthcare provider told us that there is a concern for a problem within the lining of your stomach, the request cannot be approved because your records do not show results of a prior scope that goes from your mouth to your stomach that show a reason this test is needed, your records do not show results of a prior pathology report that show a reason the requested study is needed, you must have results of a recent clinical evaluation or a physical exam and history related to the chief complaint or current condition that supports the requested study, we did not receive physical exam results that show a reason the study is needed.  Please advise if a peer to peer needs to be scheduled

## 2022-01-31 ENCOUNTER — Encounter: Payer: Self-pay | Admitting: Gastroenterology

## 2022-01-31 ENCOUNTER — Ambulatory Visit (AMBULATORY_SURGERY_CENTER): Payer: Managed Care, Other (non HMO) | Admitting: Gastroenterology

## 2022-01-31 ENCOUNTER — Encounter: Payer: Managed Care, Other (non HMO) | Admitting: Gastroenterology

## 2022-01-31 VITALS — BP 119/71 | HR 75 | Temp 98.2°F | Resp 12 | Ht 63.0 in | Wt 134.0 lb

## 2022-01-31 DIAGNOSIS — Z800 Family history of malignant neoplasm of digestive organs: Secondary | ICD-10-CM

## 2022-01-31 DIAGNOSIS — Z1509 Genetic susceptibility to other malignant neoplasm: Secondary | ICD-10-CM

## 2022-01-31 DIAGNOSIS — Z1211 Encounter for screening for malignant neoplasm of colon: Secondary | ICD-10-CM | POA: Diagnosis present

## 2022-01-31 DIAGNOSIS — Z1502 Genetic susceptibility to malignant neoplasm of ovary: Secondary | ICD-10-CM

## 2022-01-31 DIAGNOSIS — Z1501 Genetic susceptibility to malignant neoplasm of breast: Secondary | ICD-10-CM

## 2022-01-31 MED ORDER — SODIUM CHLORIDE 0.9 % IV SOLN: 500.0000 mL | Freq: Once | INTRAVENOUS | Status: DC

## 2022-01-31 NOTE — Progress Notes (Signed)
[facility] History and Physical   Primary Care Physician:  Marda Stalker, PA-C   Reason for Procedure:   Colon cancer screening, high risk  Plan:    Colonoscopy     HPI: Adrienne Williams is a 41 y.o. female with recently diagnosed CHEK2 mutation undergoing screening colonoscopy.  She has no family history of colon cancer.  She has had occasional bright red blood per rectum for many years attributable to hemorrhoids.    Past Medical History:  Diagnosis Date   Anxiety    Asthma    Depression    Family history of breast cancer    Frank breech presentation 05/29/2018   Hemorrhoids    Hx of varicella    PONV (postoperative nausea and vomiting)    "little nauseated"   Postpartum care following cesarean delivery (6/14) 01/18/2016    Past Surgical History:  Procedure Laterality Date   CESAREAN SECTION N/A 01/18/2016   Procedure: Primary CESAREAN SECTION;  Surgeon: Princess Bruins, MD;  Location: [facility];  Service: Obstetrics;  Laterality: N/A;  EDD: 01/25/16   CESAREAN SECTION N/A 05/29/2018   Procedure: Repeat CESAREAN SECTION;  Surgeon: Azucena Fallen, MD;  Location: [facility];  Service: Obstetrics;  Laterality: N/A;  EDD: 06/10/18   CYSTECTOMY  2002   pilonidal region   DIAGNOSTIC LAPAROSCOPY WITH REMOVAL OF ECTOPIC PREGNANCY Right 05/29/2020   Procedure: DIAGNOSTIC LAPAROSCOPY WITH REMOVAL OF ECTOPIC PREGNANCY;  Surgeon: Azucena Fallen, MD;  Location: [facility];  Service: Gynecology;  Laterality: Right;   MOLE REMOVAL     UNILATERAL SALPINGECTOMY Right 05/29/2020   Procedure: UNILATERAL SALPINGECTOMY;  Surgeon: Azucena Fallen, MD;  Location: [facility];  Service: Gynecology;  Laterality: Right;   WISDOM TOOTH EXTRACTION      Prior to Admission medications   Medication Sig Start Date End Date Taking? Authorizing Provider  buPROPion (WELLBUTRIN XL) 300 MG 24 hr tablet Take 300 mg by mouth daily.   Yes [provider]  busPIRone  (BUSPAR) 30 MG tablet Take 30 mg by mouth 2 (two) times daily. 12/21/21  Yes [provider]  cetirizine (ZYRTEC) 10 MG tablet Take 10 mg by mouth daily.   Yes [provider]  lamoTRIgine (LAMICTAL) 25 MG tablet Take 50 mg by mouth daily. 10/20/21  Yes [provider]  LORazepam (ATIVAN) 1 MG tablet Take by mouth. 12/09/21  Yes [provider]  magnesium oxide (MAG-OX) 400 (241.3 Mg) MG tablet Take 1 tablet (400 mg total) by mouth daily. Patient taking differently: Take 800 mg by mouth daily. 01/21/16  Yes Artelia Laroche, CNM  montelukast (SINGULAIR) 10 MG tablet Take 10 mg by mouth at bedtime.   Yes [provider]  NON FORMULARY Allergy shots once every 2 weeks   Yes [provider]  spironolactone (ALDACTONE) 100 MG tablet  01/16/22  Yes [provider]  spironolactone (ALDACTONE) 50 MG tablet 2 tablets every day by oral route. 08/21/21  Yes [provider]  VRAYLAR 1.5 MG capsule Take 1.5 mg by mouth daily. 12/20/21  Yes [provider]  zolpidem (AMBIEN) 10 MG tablet Take 10 mg by mouth at bedtime as needed. 01/30/22  Yes [provider]  ADVAIR DISKUS 100-50 MCG/ACT AEPB Inhale 1 puff into the lungs 2 (two) times daily. 01/01/22   [provider]  albuterol (VENTOLIN HFA) 108 (90 Base) MCG/ACT inhaler Inhale into the lungs. 12/05/21   [provider]  EPINEPHrine 0.3 mg/0.3 mL IJ SOAJ injection SMARTSIG:0.3 Milligram(s)  IM Once PRN 09/29/21   [provider]  Omega-3 Fatty Acids (FISH OIL) 1000 MG CAPS Take 1 capsule by mouth daily.    [provider]  zolpidem (AMBIEN) 10 MG tablet 1 tablet every day by oral route.    [provider]    Current Outpatient Medications  Medication Sig Dispense Refill   buPROPion (WELLBUTRIN XL) 300 MG 24 hr tablet Take 300 mg by mouth daily.     busPIRone (BUSPAR) 30 MG tablet Take 30 mg by mouth 2 (two) times daily.     cetirizine  (ZYRTEC) 10 MG tablet Take 10 mg by mouth daily.     lamoTRIgine (LAMICTAL) 25 MG tablet Take 50 mg by mouth daily.     LORazepam (ATIVAN) 1 MG tablet Take by mouth.     magnesium oxide (MAG-OX) 400 (241.3 Mg) MG tablet Take 1 tablet (400 mg total) by mouth daily. (Patient taking differently: Take 800 mg by mouth daily.) 30 tablet 4   montelukast (SINGULAIR) 10 MG tablet Take 10 mg by mouth at bedtime.     NON FORMULARY Allergy shots once every 2 weeks     spironolactone (ALDACTONE) 100 MG tablet      spironolactone (ALDACTONE) 50 MG tablet 2 tablets every day by oral route.     VRAYLAR 1.5 MG capsule Take 1.5 mg by mouth daily.     zolpidem (AMBIEN) 10 MG tablet Take 10 mg by mouth at bedtime as needed.     ADVAIR DISKUS 100-50 MCG/ACT AEPB Inhale 1 puff into the lungs 2 (two) times daily.     albuterol (VENTOLIN HFA) 108 (90 Base) MCG/ACT inhaler Inhale into the lungs.     EPINEPHrine 0.3 mg/0.3 mL IJ SOAJ injection SMARTSIG:0.3 Milligram(s) IM Once PRN     Omega-3 Fatty Acids (FISH OIL) 1000 MG CAPS Take 1 capsule by mouth daily.     zolpidem (AMBIEN) 10 MG tablet 1 tablet every day by oral route.     Current Facility-Administered Medications  Medication Dose Route Frequency Provider Last Rate Last Admin   0.9 %  sodium chloride infusion  500 mL Intravenous Once Daryel November, MD        Allergies as of 01/31/2022 - Review Complete 01/31/2022  Allergen Reaction Noted   Benadryl [diphenhydramine] Other (See Comments) 05/26/2018   Prednisone  05/26/2018    Family History  Problem Relation Age of Onset   ADD / ADHD Brother    Breast cancer Maternal Grandmother 55   Cancer Maternal Grandmother        stomach, possible met from breast cancer   Hypothyroidism Paternal Grandmother    Atrial fibrillation Paternal Grandmother    Angina Paternal Grandfather    Hypertension Paternal Grandfather    Hyperlipidemia Paternal Grandfather    Colon polyps Paternal Grandfather    Colon  cancer Neg Hx    Esophageal cancer Neg Hx     Social History   Socioeconomic History   Marital status: Married    Spouse name: Not on file   Number of children: 2   Years of education: Not on file   Highest education level: Not on file  Occupational History   Occupation: Pharmacist  Tobacco Use   Smoking status: Never   Smokeless tobacco: Never  Vaping Use   Vaping Use: Never used  Substance and Sexual Activity   Alcohol use: No   Drug use: No   Sexual activity: Yes  Other Topics Concern  Not on file  Social History Narrative   Not on file   Social Determinants of Health   Financial Resource Strain: Low Risk  (05/26/2018)   Overall Financial Resource Strain (CARDIA)    Difficulty of Paying Living Expenses: Not hard at all  Food Insecurity: No Food Insecurity (05/26/2018)   Hunger Vital Sign    Worried About Running Out of Food in the Last Year: Never true    Ran Out of Food in the Last Year: Never true  Transportation Needs: Unknown (05/26/2018)   PRAPARE - Hydrologist (Medical): No    Lack of Transportation (Non-Medical): Not on file  Physical Activity: Not on file  Stress: Stress Concern Present (05/26/2018)   Learned    Feeling of Stress : To some extent  Social Connections: Not on file  Intimate Partner Violence: Not At Risk (05/26/2018)   Humiliation, Afraid, Rape, and Kick questionnaire    Fear of Current or Ex-Partner: No    Emotionally Abused: No    Physically Abused: No    Sexually Abused: No    Review of Systems:  All other review of systems negative except as mentioned in the HPI.  Physical Exam: Vital signs BP 111/68   Pulse 69   Temp 98.2 F (36.8 C)   Ht 5' 3" (1.6 m)   Wt 134 lb (60.8 kg)   SpO2 100%   BMI 23.74 kg/m   General:   Alert,  Well-developed, well-nourished, pleasant and cooperative in NAD Airway:  Mallampati 2 Lungs:   Clear throughout to auscultation.   Heart:  Regular rate and rhythm; no murmurs, clicks, rubs,  or gallops. Abdomen:  Soft, nontender and nondistended. Normal bowel sounds.   Neuro/Psych:  Normal mood and affect. A and O x 3   Taneia Mealor E. Candis Schatz, MD [facility]

## 2022-01-31 NOTE — Patient Instructions (Signed)
YOU HAD AN ENDOSCOPIC PROCEDURE TODAY AT THE [facility]:   Refer to the procedure report that was given to you for any specific questions about what was found during the examination.  If the procedure report does not answer your questions, please call your gastroenterologist to clarify.  If you requested that your care partner not be given the details of your procedure findings, then the procedure report has been included in a sealed envelope for you to review at your convenience later.  YOU SHOULD EXPECT: Some feelings of bloating in the abdomen. Passage of more gas than usual.  Walking can help get rid of the air that was put into your GI tract during the procedure and reduce the bloating. If you had a lower endoscopy (such as a colonoscopy or flexible sigmoidoscopy) you may notice spotting of blood in your stool or on the toilet paper. If you underwent a bowel prep for your procedure, you may not have a normal bowel movement for a few days.  Please Note:  You might notice some irritation and congestion in your nose or some drainage.  This is from the oxygen used during your procedure.  There is no need for concern and it should clear up in a day or so.  SYMPTOMS TO REPORT IMMEDIATELY:  Following lower endoscopy (colonoscopy or flexible sigmoidoscopy):  Excessive amounts of blood in the stool  Significant tenderness or worsening of abdominal pains  Swelling of the abdomen that is new, acute  Fever of 100F or higher  For urgent or emergent issues, a gastroenterologist can be reached at any hour by calling [phone]. Do not use MyChart messaging for urgent concerns.    DIET:  We do recommend a small meal at first, but then you may proceed to your regular diet.  Drink plenty of fluids but you should avoid alcoholic beverages for 24 hours.  ACTIVITY:  You should plan to take it easy for the rest of today and you should NOT DRIVE or use heavy machinery until tomorrow (because of  the sedation medicines used during the test).    FOLLOW UP: Our staff will call the number listed on your records the next business day following your procedure.  We will call around 7:15- 8:00 am to check on you and address any questions or concerns that you may have regarding the information given to you following your procedure. If we do not reach you, we will leave a message.  If you develop any symptoms (ie: fever, flu-like symptoms, shortness of breath, cough etc.) before then, please call [phone].  If you test positive for Covid 19 in the 2 weeks post procedure, please call and report this information to us.    If any biopsies were taken you will be contacted by phone or by letter within the next 1-3 weeks.  Please call us at [phone] if you have not heard about the biopsies in 3 weeks.    SIGNATURES/CONFIDENTIALITY: You and/or your care partner have signed paperwork which will be entered into your electronic medical record.  These signatures attest to the fact that that the information above on your After Visit Summary has been reviewed and is understood.  Full responsibility of the confidentiality of this discharge information lies with you and/or your care-partner.  

## 2022-01-31 NOTE — Op Note (Signed)
[facility] Patient Name: Adrienne Williams Procedure Date: 01/31/2022 11:06 AM MRN: [id number] Endoscopist: Nicki Reaper E. Candis Schatz , MD Age: 41 Referring MD:  Date of Birth: 1980/09/04 Gender: Female Account #: [id number] Procedure:                Colonoscopy Indications:              Screening, increased colon cancer risk due to CHEK2                            mutation Medicines:                Monitored Anesthesia Care Procedure:                Pre-Anesthesia Assessment:                           - Prior to the procedure, a History and Physical                            was performed, and patient medications and                            allergies were reviewed. The patient's tolerance of                            previous anesthesia was also reviewed. The risks                            and benefits of the procedure and the sedation                            options and risks were discussed with the patient.                            All questions were answered, and informed consent                            was obtained. Prior Anticoagulants: The patient has                            taken no previous anticoagulant or antiplatelet                            agents. ASA Grade Assessment: II - A patient with                            mild systemic disease. After reviewing the risks                            and benefits, the patient was deemed in                            satisfactory condition to undergo the procedure.  After obtaining informed consent, the colonoscope                            was passed under direct vision. Throughout the                            procedure, the patient's blood pressure, pulse, and                            oxygen saturations were monitored continuously. The                            Olympus CF-HQ190L [id number]) Colonoscope was                            introduced through the anus and advanced to  the the                            terminal ileum, with identification of the                            appendiceal orifice and IC valve. The colonoscopy                            was unusually difficult due to significant looping                            and a tortuous colon. Successful completion of the                            procedure was aided by using manual pressure and                            withdrawing and reinserting the scope. The patient                            tolerated the procedure well. The quality of the                            bowel preparation was adequate. The terminal ileum,                            ileocecal valve, appendiceal orifice, and rectum                            were photographed. The bowel preparation used was                            SUPREP via split dose instruction. Scope In: 11:20:28 AM Scope Out: 11:59:02 AM Scope Withdrawal Time: 0 hours 9 minutes 12 seconds  Total Procedure Duration: 0 hours 38 minutes 34 seconds  Findings:                 The perianal and digital rectal examinations  were                            normal. Pertinent negatives include normal                            sphincter tone and no palpable rectal lesions.                           The colon (entire examined portion) appeared normal.                           The terminal ileum appeared normal.                           The retroflexed view of the distal rectum and anal                            verge was normal and showed no anal or rectal                            abnormalities. Complications:            No immediate complications. Estimated Blood Loss:     Estimated blood loss: none. Impression:               - The entire examined colon is normal.                           - The examined portion of the ileum was normal.                           - The distal rectum and anal verge are normal on                            retroflexion view.                            - No specimens collected. Recommendation:           - Patient has a contact number available for                            emergencies. The signs and symptoms of potential                            delayed complications were discussed with the                            patient. Return to normal activities tomorrow.                            Written discharge instructions were provided to the                            patient.                           -  Resume previous diet.                           - Continue present medications.                           - Repeat colonoscopy in 5 years for screening                            purposes.                           - Recommend use of ColoWrap device for subsequent                            colonoscopies                           - Also consider 2 day bowel prep as bowel prep                            quality was borderline adequate. Leroi Haque E. Candis Schatz, MD 01/31/2022 12:15:34 PM This report has been signed electronically.

## 2022-01-31 NOTE — Progress Notes (Signed)
To pacu, VSS. Report to [email or web address] 

## 2022-01-31 NOTE — Progress Notes (Signed)
Pt's states no medical or surgical changes since previsit or office visit. 

## 2022-02-01 ENCOUNTER — Telehealth: Payer: Self-pay

## 2022-02-01 NOTE — Telephone Encounter (Signed)
  Follow up Call-     01/31/2022   10:38 AM  Call back number  Post procedure Call Back phone  # [phone]  Permission to leave phone message Yes     Patient questions:  Do you have a fever, pain , or abdominal swelling? No. Pain Score  0 *  Have you tolerated food without any problems? Yes.    Have you been able to return to your normal activities? Yes.    Do you have any questions about your discharge instructions: Diet   No. Medications  No. Follow up visit  No.  Do you have questions or concerns about your Care? No.  Actions: * If pain score is 4 or above: No action needed, pain <4.

## 2022-03-15 ENCOUNTER — Ambulatory Visit: Payer: Managed Care, Other (non HMO) | Attending: Family Medicine | Admitting: Physical Therapy

## 2022-03-15 ENCOUNTER — Other Ambulatory Visit: Payer: Self-pay

## 2022-03-15 ENCOUNTER — Encounter: Payer: Self-pay | Admitting: Physical Therapy

## 2022-03-15 DIAGNOSIS — M542 Cervicalgia: Secondary | ICD-10-CM | POA: Diagnosis present

## 2022-03-15 DIAGNOSIS — R252 Cramp and spasm: Secondary | ICD-10-CM | POA: Insufficient documentation

## 2022-03-15 DIAGNOSIS — M6281 Muscle weakness (generalized): Secondary | ICD-10-CM | POA: Diagnosis present

## 2022-03-15 NOTE — Therapy (Signed)
OUTPATIENT PHYSICAL THERAPY CERVICAL EVALUATION   Patient Name: Adrienne Williams MRN: [id number] DOB:06/01/1981, 41 y.o., female Today's Date: 03/15/2022   PT End of Session - 03/15/22 1353     Visit Number 1    Date for PT Re-Evaluation 05/10/22    Authorization Type [employer]    PT Start Time 1400    PT Stop Time 1440    PT Time Calculation (min) 40 min    Activity Tolerance Patient tolerated treatment well             Past Medical History:  Diagnosis Date   Anxiety    Asthma    Depression    Family history of breast cancer    Frank breech presentation 05/29/2018   Hemorrhoids    Hx of varicella    PONV (postoperative nausea and vomiting)    "little nauseated"   Postpartum care following cesarean delivery (6/14) 01/18/2016   Past Surgical History:  Procedure Laterality Date   CESAREAN SECTION N/A 01/18/2016   Procedure: Primary CESAREAN SECTION;  Surgeon: Princess Bruins, MD;  Location: [place];  Service: Obstetrics;  Laterality: N/A;  EDD: 01/25/16   CESAREAN SECTION N/A 05/29/2018   Procedure: Repeat CESAREAN SECTION;  Surgeon: Azucena Fallen, MD;  Location: [place];  Service: Obstetrics;  Laterality: N/A;  EDD: 06/10/18   CYSTECTOMY  2002   pilonidal region   DIAGNOSTIC LAPAROSCOPY WITH REMOVAL OF ECTOPIC PREGNANCY Right 05/29/2020   Procedure: DIAGNOSTIC LAPAROSCOPY WITH REMOVAL OF ECTOPIC PREGNANCY;  Surgeon: Azucena Fallen, MD;  Location: [facility];  Service: Gynecology;  Laterality: Right;   MOLE REMOVAL     UNILATERAL SALPINGECTOMY Right 05/29/2020   Procedure: UNILATERAL SALPINGECTOMY;  Surgeon: Azucena Fallen, MD;  Location: [place];  Service: Gynecology;  Laterality: Right;   WISDOM TOOTH EXTRACTION     Patient Active Problem List   Diagnosis Date Noted   Family history of breast cancer 38/45/3646   Monoallelic mutation of CHEK2 gene in female patient 01/18/2022   Anxiety and depression 05/31/2018   Rh negative, maternal 05/31/2018    Pilar Plate breech presentation 05/29/2018   Status post repeat low transverse cesarean section 05/29/2018   Postpartum care following cesarean delivery (10/24) 05/29/2018    PCP: Marda Stalker PA  REFERRING PROVIDER: Angelina Pih MD  REFERRING DIAG: M54.2 chronic neck pain   THERAPY DIAG:  Cervicalgia; spasm; weakness  Rationale for Evaluation and Treatment Rehabilitation  ONSET DATE: "Forever" 08/06/21  SUBJECTIVE:  SUBJECTIVE STATEMENT: Tight and squeezing both sides of my neck, jaw, chest and head; work Warehouse manager) makes it worse, the weather.  The patient is tearful when reporting pain location and severity.    PERTINENT HISTORY:  Asthma; fibromyalgia; depression (pt feels this is well controlled); restarted Baclofen  PAIN:  Are you having pain? 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 No  LIVING ENVIRONMENT: Lives with: lives with their family Lives in: House/apartment  OCCUPATION: full time pharmacist ACTIVITY:  [place] riding 1x/week, run in the backyard 2x/week;  agility with the dog  PLOF: Independent  PATIENT GOALS for the pain not make me cry.  I cry when I'm by myself .  OBJECTIVE:   DIAGNOSTIC FINDINGS:  none  PATIENT SURVEYS:  FOTO 42%   COGNITION: Overall cognitive status: Within functional limits for tasks assessed   POSTURE: mild head forward; prominent scalenes and SCM  PALPATION: Cervical joint mobility WNLS;   decreased soft tissue mobility upper traps, suboccipitals; masseters right > left  CERVICAL ROM:   Active ROM A/PROM (deg) eval  Flexion 45  Extension 57  Right lateral flexion 48  Left lateral flexion 44  Right rotation 50  Left rotation 57   (Blank rows = not tested)   Decreased thoracic extension and rotation mobility  UPPER EXTREMITY ROM: WFLS , reports right shoulder clicking  UPPER EXTREMITY MMT:   Decreased strength cervical extensors and deep cervical flexors, middle and lower traps grossly 4-/5  CERVICAL SPECIAL TESTS:  Distraction test: Negative    TODAY'S TREATMENT:  Discussed anti-inflammatory strategies: meditation, exercise, diet, sleep per handout   PATIENT EDUCATION:  Education details: anti inflammatory strategies Person educated: Patient Education method: Explanation Education comprehension: verbalized understanding   HOME EXERCISE PROGRAM: To be initiated next visit  ASSESSMENT:  CLINICAL IMPRESSION: Patient is a 41 y.o. female who was seen today for physical therapy evaluation and treatment for trapezius muscle strain/spasm and neck pain.  Pain is severe and patient is tearful throughout session.  Pain is present not only in the neck but extends into her jaw, chest and head as well in a myofascial pain pattern.  The patient would benefit from skilled PT to address decreased cervical range of motion, correct muscle strength asymmetries and weakness in deep cervical flexors and extensors, improve postural strength including and scapular retractor and depressors and address pain levels.  All affect patient's ability to perform home and work [employer] including work tasks, driving,  and Medical illustrator.      OBJECTIVE IMPAIRMENTS decreased activity tolerance, decreased ROM, decreased strength, increased fascial restrictions, increased muscle spasms, and pain.   ACTIVITY LIMITATIONS carrying, lifting, sitting, standing, and caring for  others  PARTICIPATION LIMITATIONS: cleaning, driving, and occupation  PERSONAL FACTORS Past/current experiences, Profession, Time since onset of injury/illness/exacerbation, and 1-2 comorbidities: depression,asthma, fibromyalgia  are also affecting patient's functional outcome.   REHAB POTENTIAL: Good  CLINICAL DECISION MAKING: Evolving/moderate complexity  EVALUATION COMPLEXITY: Moderate   GOALS: Goals reviewed with patient? Yes  SHORT TERM GOALS: Target date: 04/12/2022   The patient will demonstrate knowledge of basic self care strategies and exercises to promote healing   Baseline:  Goal status: INITIAL  2.  The patient will report a 30% improvement in pain levels with functional activities which are currently difficult including home and work tasks Baseline:  Goal status: INITIAL  3.  Improved cervical rotation to 60 degrees bil needed for driving Baseline:  Goal status: INITIAL   LONG TERM GOALS: Target date: 05/10/2022  The patient will be independent in a safe self progression of a home exercise program to promote further recovery of function   Baseline:  Goal status: INITIAL  2.  The patient will report a 60% improvement in pain levels with functional activities which are currently difficult including work tasks Baseline:  Goal status: INITIAL  3.  Improved cervical sidebending ROM to 52 degrees and rotation to 60 degrees needed for driving and work duties Baseline:  Goal status: INITIAL  4.  Improved cervical, scapular and glenohumeral strength to grossly 4+/5 needed for home and work ADLs Baseline:  Goal status: INITIAL  5.  The patient will have improved FOTO score to  58%     indicating improved function with less pain  Baseline:  Goal status: INITIAL   PLAN: PT FREQUENCY: 1-2x/week  PT DURATION: 8 weeks  PLANNED INTERVENTIONS: Therapeutic exercises, Therapeutic activity, Neuromuscular re-education, Patient/Family education, Self Care, Joint  mobilization, Aquatic Therapy, Dry Needling, Electrical stimulation, Spinal mobilization, Cryotherapy, Moist heat, Taping, Traction, Ultrasound, Ionotophoresis 49m/ml Dexamethasone, Manual therapy, and Re-evaluation  PLAN FOR NEXT SESSION: DN with ES to posterior cervical muscles, DN to scalenes, masseter and temporalis; has OPTP foam roll at home try Melt ex's, open books, vertical lying on foam roll for pec stretch; pain education;  diaphragmatic breathing to decrease scalene and SCM muscle overuse  SRuben Im PT 03/15/22 7:18 PM Phone: [phone]: [phone]

## 2022-03-15 NOTE — Patient Instructions (Signed)
"  TAKE YOUR MEDS"   Acronym to reduce the body's inflammatory response  1) MEDITATION: Mindfulness and meditation have been proven to reduce the brain's over-sensitization and stimulation   2) EXERCISE: Activity pumps the muscles which in return washes those inflammatory cells away   3)DIET: Eat healthy foods especially plants. Stay away from processed foods, especially sugar which has been proven to INCREASE pain levels!   4)SLEEP: Prioritize sleep right now.  Avoid screens for the 30 minutes before bedtime.  A cool, dark room is best for sleeping.  Your body needs quality sleep for healing.     Lavinia Sharps, PT 03/15/22 2:45 PM Phone: [phone] Fax: [phone]

## 2022-03-20 ENCOUNTER — Ambulatory Visit: Payer: Managed Care, Other (non HMO) | Admitting: Physical Therapy

## 2022-03-20 DIAGNOSIS — M6281 Muscle weakness (generalized): Secondary | ICD-10-CM

## 2022-03-20 DIAGNOSIS — M542 Cervicalgia: Secondary | ICD-10-CM

## 2022-03-20 DIAGNOSIS — R252 Cramp and spasm: Secondary | ICD-10-CM

## 2022-03-20 NOTE — Therapy (Signed)
OUTPATIENT PHYSICAL THERAPY CERVICAL PROGRESS NOTE Patient Name: Adrienne Williams MRN: [id number] DOB:[birth year], 41 y.o., female Today's Date: 03/20/2022   PT End of Session - 03/20/22 0929     Visit Number 2    Date for PT Re-Evaluation 05/10/22    Authorization Type [employer]    PT Start Time 0930    PT Stop Time 1010    PT Time Calculation (min) 40 min    Activity Tolerance Patient tolerated treatment well             Past Medical History:  Diagnosis Date   Anxiety    Asthma    Depression    Family history of breast cancer    Frank breech presentation 05/29/2018   Hemorrhoids    Hx of varicella    PONV (postoperative nausea and vomiting)    "little nauseated"   Postpartum care following cesarean delivery (6/14) 01/18/2016   Past Surgical History:  Procedure Laterality Date   CESAREAN SECTION N/A 01/18/2016   Procedure: Primary CESAREAN SECTION;  Surgeon: Princess Bruins, MD;  Location: [place];  Service: Obstetrics;  Laterality: N/A;  EDD: 01/25/16   CESAREAN SECTION N/A 05/29/2018   Procedure: Repeat CESAREAN SECTION;  Surgeon: Azucena Fallen, MD;  Location: [place];  Service: Obstetrics;  Laterality: N/A;  EDD: 06/10/18   CYSTECTOMY  2002   pilonidal region   DIAGNOSTIC LAPAROSCOPY WITH REMOVAL OF ECTOPIC PREGNANCY Right 05/29/2020   Procedure: DIAGNOSTIC LAPAROSCOPY WITH REMOVAL OF ECTOPIC PREGNANCY;  Surgeon: Azucena Fallen, MD;  Location: [place];  Service: Gynecology;  Laterality: Right;   MOLE REMOVAL     UNILATERAL SALPINGECTOMY Right 05/29/2020   Procedure: UNILATERAL SALPINGECTOMY;  Surgeon: Azucena Fallen, MD;  Location: [place];  Service: Gynecology;  Laterality: Right;   WISDOM TOOTH EXTRACTION     Patient Active Problem List   Diagnosis Date Noted   Family history of breast cancer 50/51/8335   Monoallelic mutation of CHEK2 gene in female patient 01/18/2022   Anxiety and depression 05/31/2018   Rh negative, maternal 05/31/2018    Pilar Plate breech presentation 05/29/2018   Status post repeat low transverse cesarean section 05/29/2018   Postpartum care following cesarean delivery (10/24) 05/29/2018    PCP: Marda Stalker PA  REFERRING PROVIDER: Angelina Pih MD  REFERRING DIAG: M54.2 chronic neck pain   THERAPY DIAG:  Cervicalgia; spasm; weakness  Rationale for Evaluation and Treatment Rehabilitation  ONSET DATE: "Forever" 08/06/21  SUBJECTIVE:  SUBJECTIVE STATEMENT:  Worked over the weekend.  Worst right jaw, anterior/lateral neck is the worst area.  Squeezing in temples and suboccipitals.    PERTINENT HISTORY:  Asthma; fibromyalgia; depression (pt feels this is well controlled); restarted Baclofen  PAIN:  Are you having pain? 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 No  LIVING ENVIRONMENT: Lives with: lives with their family Lives in: House/apartment  OCCUPATION: full time pharmacist ACTIVITY:  [place] riding 1x/week, run in the backyard 2x/week;  agility with the dog  PLOF: Independent  PATIENT GOALS for the pain not make me cry.  I cry when I'm by myself .  OBJECTIVE:   DIAGNOSTIC FINDINGS:  none  PATIENT SURVEYS:  FOTO 42%   COGNITION: Overall cognitive status: Within functional limits for tasks assessed   POSTURE: mild head forward; prominent scalenes and SCM  PALPATION: Cervical joint mobility WNLS;  decreased soft tissue mobility upper traps, suboccipitals;  masseters right > left  CERVICAL ROM:   Active ROM A/PROM (deg) eval  Flexion 45  Extension 57  Right lateral flexion 48  Left lateral flexion 44  Right rotation 50  Left rotation 57   (Blank rows = not tested)   Decreased thoracic extension and rotation mobility  UPPER EXTREMITY ROM: WFLS , reports right shoulder clicking  UPPER EXTREMITY MMT:   Decreased strength cervical extensors and deep cervical flexors, middle and lower traps grossly 4-/5  CERVICAL SPECIAL TESTS:  Distraction test: Negative    TODAY'S TREATMENT:  8/15: Manual therapy Trigger Point Dry-Needling  Treatment instructions: Expect mild to moderate muscle soreness. S/S of pneumothorax if dry needled over a lung field, and to seek immediate medical attention should they occur. Patient verbalized understanding of these instructions and education.  Patient Consent Given: {TPDNConsent:26509} Education handout provided: {TPDNEducationHandount:26511} Muscles treated: *** Electrical stimulation performed: {TPDNElectricalStim:26510} Parameters: {TPDNParameters:26512} Treatment response/outcome: ***  Therapeutic ex: Moist heat 2 min post DN   PATIENT EDUCATION:  Education details: anti inflammatory strategies; dry needling after care Person educated: Patient Education method: Explanation Education comprehension: verbalized understanding   HOME EXERCISE PROGRAM: Access Code: [email or web address] URL: [email or web address] Date: 03/20/2022 Prepared by: Ruben Im  Exercises - Neck Mobilization with Foam Roller  - 1 x daily - 7 x weekly - 1 sets - 10 reps - Supine Static Chest Stretch on Foam Roll  - 1 x daily - 7 x weekly - 3 sets - 10 reps - Overhead Reach on Foam Roll  - 1 x daily - 7 x weekly - 1 sets - 10 reps - Thoracic Mobilization with Hands Behind Head on Foam Roll  - 1 x daily - 7 x weekly - 1 sets - 10 reps - Sidelying Open Book Thoracic Rotation with Knee on Foam Roll  - 1 x daily - 7 x  weekly - 1 sets - 10 reps  ASSESSMENT:  CLINICAL IMPRESSION:   OBJECTIVE IMPAIRMENTS decreased activity tolerance, decreased ROM, decreased strength, increased fascial restrictions, increased muscle spasms, and pain.   ACTIVITY LIMITATIONS carrying, lifting, sitting, standing, and caring for others  PARTICIPATION LIMITATIONS: cleaning, driving, and occupation  PERSONAL FACTORS Past/current experiences, Profession, Time since onset of injury/illness/exacerbation, and 1-2 comorbidities: depression,asthma,  fibromyalgia  are also affecting patient's functional outcome.   REHAB POTENTIAL: Good  CLINICAL DECISION MAKING: Evolving/moderate complexity  EVALUATION COMPLEXITY: Moderate   GOALS: Goals reviewed with patient? Yes  SHORT TERM GOALS: Target date: 04/12/2022   The patient will demonstrate knowledge of basic self care strategies and exercises to promote healing   Baseline:  Goal status: INITIAL  2.  The patient will report a 30% improvement in pain levels with functional activities which are currently difficult including home and work tasks Baseline:  Goal status: INITIAL  3.  Improved cervical rotation to 60 degrees bil needed for driving Baseline:  Goal status: INITIAL   LONG TERM GOALS: Target date: 05/10/2022  The patient will be independent in a safe self progression of a home exercise program to promote further recovery of function   Baseline:  Goal status: INITIAL  2.  The patient will report a 60% improvement in pain levels with functional activities which are currently difficult including work tasks Baseline:  Goal status: INITIAL  3.  Improved cervical sidebending ROM to 52 degrees and rotation to 60 degrees needed for driving and work duties Baseline:  Goal status: INITIAL  4.  Improved cervical, scapular and glenohumeral strength to grossly 4+/5 needed for home and work ADLs Baseline:  Goal status: INITIAL  5.  The patient will have improved FOTO  score to  58%     indicating improved function with less pain  Baseline:  Goal status: INITIAL   PLAN: PT FREQUENCY: 1-2x/week  PT DURATION: 8 weeks  PLANNED INTERVENTIONS: Therapeutic exercises, Therapeutic activity, Neuromuscular re-education, Patient/Family education, Self Care, Joint mobilization, Aquatic Therapy, Dry Needling, Electrical stimulation, Spinal mobilization, Cryotherapy, Moist heat, Taping, Traction, Ultrasound, Ionotophoresis 13m/ml Dexamethasone, Manual therapy, and Re-evaluation  PLAN FOR NEXT SESSION: DN with ES to posterior cervical muscles, DN to scalenes, masseter and temporalis; has OPTP foam roll at home try Melt ex's, open books, vertical lying on foam roll for pec stretch; pain education;  diaphragmatic breathing to decrease scalene and SCM muscle overuse  SRuben Im PT 03/20/22 9:29 AM Phone: [phone]: [phone]

## 2022-03-28 ENCOUNTER — Ambulatory Visit: Payer: Managed Care, Other (non HMO) | Admitting: Physical Therapy

## 2022-03-28 DIAGNOSIS — M6281 Muscle weakness (generalized): Secondary | ICD-10-CM

## 2022-03-28 DIAGNOSIS — R252 Cramp and spasm: Secondary | ICD-10-CM

## 2022-03-28 DIAGNOSIS — M542 Cervicalgia: Secondary | ICD-10-CM

## 2022-03-28 NOTE — Therapy (Signed)
OUTPATIENT PHYSICAL THERAPY CERVICAL PROGRESS NOTE Patient Name: Adrienne Williams MRN: [id number] DOB:05/15/1981, 41 y.o., female Today's Date: 03/28/2022   PT End of Session - 03/28/22 0834     Visit Number 3    Date for PT Re-Evaluation 05/10/22    Authorization Type [employer]    PT Start Time 0835    PT Stop Time 0915    PT Time Calculation (min) 40 min    Activity Tolerance Patient tolerated treatment well             Past Medical History:  Diagnosis Date   Anxiety    Asthma    Depression    Family history of breast cancer    Frank breech presentation 05/29/2018   Hemorrhoids    Hx of varicella    PONV (postoperative nausea and vomiting)    "little nauseated"   Postpartum care following cesarean delivery (6/14) 01/18/2016   Past Surgical History:  Procedure Laterality Date   CESAREAN SECTION N/A 01/18/2016   Procedure: Primary CESAREAN SECTION;  Surgeon: Princess Bruins, MD;  Location: [place];  Service: Obstetrics;  Laterality: N/A;  EDD: 01/25/16   CESAREAN SECTION N/A 05/29/2018   Procedure: Repeat CESAREAN SECTION;  Surgeon: Azucena Fallen, MD;  Location: [place];  Service: Obstetrics;  Laterality: N/A;  EDD: 06/10/18   CYSTECTOMY  2002   pilonidal region   DIAGNOSTIC LAPAROSCOPY WITH REMOVAL OF ECTOPIC PREGNANCY Right 05/29/2020   Procedure: DIAGNOSTIC LAPAROSCOPY WITH REMOVAL OF ECTOPIC PREGNANCY;  Surgeon: Azucena Fallen, MD;  Location: [place];  Service: Gynecology;  Laterality: Right;   MOLE REMOVAL     UNILATERAL SALPINGECTOMY Right 05/29/2020   Procedure: UNILATERAL SALPINGECTOMY;  Surgeon: Azucena Fallen, MD;  Location: [place];  Service: Gynecology;  Laterality: Right;   WISDOM TOOTH EXTRACTION     Patient Active Problem List   Diagnosis Date Noted   Family history of breast cancer 27/02/8674   Monoallelic mutation of CHEK2 gene in female patient 01/18/2022   Anxiety and depression 05/31/2018   Rh negative, maternal 05/31/2018    Pilar Plate breech presentation 05/29/2018   Status post repeat low transverse cesarean section 05/29/2018   Postpartum care following cesarean delivery (10/24) 05/29/2018    PCP: Marda Stalker PA  REFERRING PROVIDER: Angelina Pih MD  REFERRING DIAG: M54.2 chronic neck pain   THERAPY DIAG:  Cervicalgia; spasm; weakness  Rationale for Evaluation and Treatment Rehabilitation  ONSET DATE: "Forever" 08/06/21  SUBJECTIVE:  SUBJECTIVE STATEMENT:  Sleepy this morning. Getting the kids back to school.   DN was OK good moments and bad moments.  The jaw area is more intense since you needled.  Maybe work triggered it.  I do foam roll ex's at the end of the day.    PERTINENT HISTORY:  Asthma; fibromyalgia; depression (pt feels this is well controlled); restarted Baclofen  Has a kettlebell at home; bands and dumbbells 2# and 5# and 15# at home PAIN:  Are you having pain? 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 No  LIVING ENVIRONMENT: Lives with: lives with their family Lives in: House/apartment  OCCUPATION: full time pharmacist ACTIVITY:  [place] riding 1x/week, run in the backyard 2x/week;  agility with the dog  PLOF: Independent  PATIENT GOALS for the pain not make me cry.  I cry when I'm by myself .  OBJECTIVE:   DIAGNOSTIC FINDINGS:  none  PATIENT SURVEYS:  FOTO 42%   COGNITION: Overall cognitive status: Within functional limits for tasks  assessed   POSTURE: mild head forward; prominent scalenes and SCM  PALPATION: Cervical joint mobility WNLS;  decreased soft tissue mobility upper traps, suboccipitals; masseters right > left  CERVICAL ROM:   Active ROM A/PROM (deg) eval  Flexion 45  Extension 57  Right lateral flexion 48  Left lateral flexion 44  Right rotation 50  Left rotation 57   (Blank rows = not tested)   Decreased thoracic extension and rotation mobility  UPPER EXTREMITY ROM: WFLS , reports right shoulder clicking  UPPER EXTREMITY MMT:   Decreased strength cervical extensors and deep cervical flexors, middle and lower traps grossly 4-/5  CERVICAL SPECIAL TESTS:  Distraction test: Negative    TODAY'S TREATMENT:  8/23: Discussion of benefit of strength training for chronic neck pain Supine deep neck flexor activation with nods 12x (fingers on SCM and scalenes to avoid use of these muscles) Seated thoracic extension with ball, hands supporting neck 10x Standing green band rows anchored on door 10x Standing green band shoulder extensions over the top of the door 10x Standing in front of the mirror 3 way raises 2# 7x Standing dead lifts to mid shin 10# kettlebell       8/15: Manual therapy soft tissue mobilization to upper trap, masseter, SCM, scalene  Trigger Point Dry-Needling  Treatment instructions: Expect mild to moderate muscle soreness. S/S of pneumothorax if dry needled over a lung field, and to seek immediate medical attention should they occur. Patient verbalized understanding of these instructions and education.  Patient Consent Given: Yes Education handout provided: Previously provided Muscles treated: right masseter, right SCM and scalenes, upper trap right Electrical stimulation performed: No Parameters: N/A Treatment response/outcome: improved soft tissue mobility  Therapeutic ex: foam roll cervical rotation, circles, supine thoracic extension, supine lying on roll vertically  with UE elevation with coordinated breathing; open books 5-10x each Moist heat 2 min post DN   PATIENT EDUCATION:  Education details: anti inflammatory strategies; dry needling after care Person educated: Patient Education method: Explanation Education comprehension: verbalized understanding   HOME EXERCISE PROGRAM:  Access Code: [email or web address] URL: [email or web address] Date: 03/28/2022 Prepared by: Ruben Im  Exercises - Neck Mobilization  with Foam Roller  - 1 x daily - 7 x weekly - 1 sets - 10 reps - Supine Static Chest Stretch on Foam Roll  - 1 x daily - 7 x weekly - 3 sets - 10 reps - Overhead Reach on Foam Roll  - 1 x daily - 7 x weekly - 1 sets - 10 reps - Thoracic Mobilization with Hands Behind Head on Foam Roll  - 1 x daily - 7 x weekly - 1 sets - 10 reps - Sidelying Open Book Thoracic Rotation with Knee on Foam Roll  - 1 x daily - 7 x weekly - 1 sets - 10 reps - Supine Deep Neck Flexor Training  - 1 x daily - 7 x weekly - 1 sets - 10 reps - Seated Thoracic Lumbar Extension with Pectoralis Stretch  - 1 x daily - 7 x weekly - 1 sets - 10 reps - Standing Row with Anchored Resistance  - 1 x daily - 7 x weekly - 2 sets - 10 reps - Shoulder extension with resistance - Neutral  - 1 x daily - 7 x weekly - 2 sets - 10 reps - Standing Shoulder Flexion to 90 Degrees with Dumbbells  - 1 x daily - 7 x weekly - 1 sets - 8 reps - Half Deadlift with Kettlebell  - 1 x daily - 7 x weekly - 1 sets - 10 reps - Farmer's Carry with Kettlebells  - 1 x daily - 7 x weekly - 10 reps ASSESSMENT:  CLINICAL IMPRESSION: Treatment focus on strengthening as a means for pain modulation, better tolerance to static positions at work and postural support.  She  OBJECTIVE IMPAIRMENTS decreased activity tolerance, decreased ROM, decreased strength, increased fascial restrictions, increased muscle spasms, and pain.   ACTIVITY LIMITATIONS carrying, lifting, sitting, standing, and caring for  others  PARTICIPATION LIMITATIONS: cleaning, driving, and occupation  PERSONAL FACTORS Past/current experiences, Profession, Time since onset of injury/illness/exacerbation, and 1-2 comorbidities: depression,asthma, fibromyalgia  are also affecting patient's functional outcome.   REHAB POTENTIAL: Good  CLINICAL DECISION MAKING: Evolving/moderate complexity  EVALUATION COMPLEXITY: Moderate   GOALS: Goals reviewed with patient? Yes  SHORT TERM GOALS: Target date: 04/12/2022   The patient will demonstrate knowledge of basic self care strategies and exercises to promote healing   Baseline:  Goal status: INITIAL  2.  The patient will report a 30% improvement in pain levels with functional activities which are currently difficult including home and work tasks Baseline:  Goal status: INITIAL  3.  Improved cervical rotation to 60 degrees bil needed for driving Baseline:  Goal status: INITIAL   LONG TERM GOALS: Target date: 05/10/2022  The patient will be independent in a safe self progression of a home exercise program to promote further recovery of function   Baseline:  Goal status: INITIAL  2.  The patient will report a 60% improvement in pain levels with functional activities which are currently difficult including work tasks Baseline:  Goal status: INITIAL  3.  Improved cervical sidebending ROM to 52 degrees and rotation to 60 degrees needed for driving and work duties Baseline:  Goal status: INITIAL  4.  Improved cervical, scapular and glenohumeral strength to grossly 4+/5 needed for home and work ADLs Baseline:  Goal status: INITIAL  5.  The patient will have improved FOTO score to  58%     indicating improved function with less pain  Baseline:  Goal status: INITIAL   PLAN: PT FREQUENCY: 1-2x/week  PT DURATION: 8 weeks  PLANNED INTERVENTIONS: Therapeutic exercises, Therapeutic activity, Neuromuscular re-education, Patient/Family education, Self Care, Joint  mobilization, Aquatic Therapy, Dry Needling, Electrical stimulation, Spinal mobilization, Cryotherapy, Moist heat, Taping, Traction, Ultrasound, Ionotophoresis 20m/ml Dexamethasone, Manual therapy, and Re-evaluation  PLAN FOR NEXT SESSION: deep cervical flexor neuromuscular re-ed;  strengthening/muscle pumping for chronic pain control;  patient likes low reps but heavier loads more than light weights/high reps;   manual therapy  or DN to scalenes, masseter and temporalis; may try DN with ES to posterior cervical muscles, review and progress HEP;  pain education;  diaphragmatic breathing to decrease scalene and SCM muscle overuse  SRuben Im PT 03/28/22 9:29 AM Phone: [phone]: [phone]

## 2022-04-02 ENCOUNTER — Ambulatory Visit: Payer: Managed Care, Other (non HMO) | Admitting: Physical Therapy

## 2022-04-02 ENCOUNTER — Encounter: Payer: Self-pay | Admitting: Physical Therapy

## 2022-04-02 DIAGNOSIS — M542 Cervicalgia: Secondary | ICD-10-CM

## 2022-04-02 DIAGNOSIS — M6281 Muscle weakness (generalized): Secondary | ICD-10-CM

## 2022-04-02 DIAGNOSIS — R252 Cramp and spasm: Secondary | ICD-10-CM

## 2022-04-02 NOTE — Therapy (Signed)
OUTPATIENT PHYSICAL THERAPY CERVICAL PROGRESS NOTE Patient Name: Adrienne Williams MRN: [id number] DOB:11-24-1980, 41 y.o., female Today's Date: 04/02/2022   PT End of Session - 04/02/22 1223     Visit Number 4    Date for PT Re-Evaluation 05/10/22    Authorization Type [employer]    PT Start Time 1223    PT Stop Time 1308    PT Time Calculation (min) 45 min    Activity Tolerance Patient tolerated treatment well    Behavior During Therapy [facility] for tasks assessed/performed              Past Medical History:  Diagnosis Date   Anxiety    Asthma    Depression    Family history of breast cancer    Frank breech presentation 05/29/2018   Hemorrhoids    Hx of varicella    PONV (postoperative nausea and vomiting)    "little nauseated"   Postpartum care following cesarean delivery (6/14) 01/18/2016   Past Surgical History:  Procedure Laterality Date   CESAREAN SECTION N/A 01/18/2016   Procedure: Primary CESAREAN SECTION;  Surgeon: Princess Bruins, MD;  Location: [place];  Service: Obstetrics;  Laterality: N/A;  EDD: 01/25/16   CESAREAN SECTION N/A 05/29/2018   Procedure: Repeat CESAREAN SECTION;  Surgeon: Azucena Fallen, MD;  Location: [place];  Service: Obstetrics;  Laterality: N/A;  EDD: 06/10/18   CYSTECTOMY  2002   pilonidal region   DIAGNOSTIC LAPAROSCOPY WITH REMOVAL OF ECTOPIC PREGNANCY Right 05/29/2020   Procedure: DIAGNOSTIC LAPAROSCOPY WITH REMOVAL OF ECTOPIC PREGNANCY;  Surgeon: Azucena Fallen, MD;  Location: [place];  Service: Gynecology;  Laterality: Right;   MOLE REMOVAL     UNILATERAL SALPINGECTOMY Right 05/29/2020   Procedure: UNILATERAL SALPINGECTOMY;  Surgeon: Azucena Fallen, MD;  Location: [place];  Service: Gynecology;  Laterality: Right;   WISDOM TOOTH EXTRACTION     Patient Active Problem List   Diagnosis Date Noted   Family history of breast cancer 66/44/0347   Monoallelic mutation of CHEK2 gene in female patient 01/18/2022   Anxiety and  depression 05/31/2018   Rh negative, maternal 05/31/2018   Pilar Plate breech presentation 05/29/2018   Status post repeat low transverse cesarean section 05/29/2018   Postpartum care following cesarean delivery (10/24) 05/29/2018    PCP: Marda Stalker PA  REFERRING PROVIDER: Angelina Pih MD  REFERRING DIAG: M54.2 chronic neck pain   THERAPY DIAG:  Cervicalgia; spasm; weakness  Rationale for Evaluation and Treatment Rehabilitation  ONSET DATE: "Forever" 08/06/21  SUBJECTIVE:  SUBJECTIVE STATEMENT:  I like lifting the weights. My neck is pretty stiff. No increased pain with resistance.   PERTINENT HISTORY:  Asthma; fibromyalgia; depression (pt feels this is well controlled); restarted Baclofen  Has a kettlebell at home; bands and dumbbells 2# and 5# and 15# at home  PAIN:  Are you having pain? 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 No  LIVING ENVIRONMENT: Lives with: lives with their family Lives in: House/apartment  OCCUPATION: full time pharmacist ACTIVITY:  [place] riding 1x/week, run in the backyard 2x/week;  agility with the dog  PLOF: Independent  PATIENT GOALS for the pain not make me cry.  I cry when I'm by myself .  OBJECTIVE:   DIAGNOSTIC FINDINGS:  none  PATIENT SURVEYS:  FOTO 42%   COGNITION: Overall cognitive status: Within functional limits for tasks assessed   POSTURE: mild head  forward; prominent scalenes and SCM  PALPATION: Cervical joint mobility WNLS;  decreased soft tissue mobility upper traps, suboccipitals; masseters right > left  CERVICAL ROM:   Active ROM A/PROM (deg) eval A/ROM 04/02/22  Flexion 45   Extension 57   Right lateral flexion 48   Left lateral flexion 44   Right rotation 50 59  Left rotation 57 60   (Blank rows = not tested)   Decreased thoracic extension and rotation mobility  UPPER EXTREMITY ROM: WFLS , reports right shoulder clicking  UPPER EXTREMITY MMT:   Decreased strength cervical extensors and deep cervical flexors, middle and lower traps grossly 4-/5  CERVICAL SPECIAL TESTS:  Distraction test: Negative    TODAY'S TREATMENT:  04/02/22: Standing deltoid 3 way raise 3# 5x Head press/chin tuck on Blue physioball 5x 3 sec hold, then added red band horizontal abd 5x holding neck position,  diagonals 5x each way, red band overhead 3x on stretch Standing dead lifts to mid shin 10# kettlebell 5x2 Self Snags with towel for rotation 10x Bil Thoracic extension over roll 5x Vertical foam roll: 1 min breathing, then 5 Angel arms with VC to keep neck soft  8/23: Discussion of benefit of strength training for chronic neck pain Supine deep neck flexor activation with nods 12x (fingers on SCM and scalenes to avoid use of these muscles) Seated thoracic extension with ball, hands supporting neck 10x Standing green band rows anchored on door 10x Standing green band shoulder extensions over the top of the door 10x Standing in front of the mirror 3 way raises 2# 7x Standing dead lifts to mid shin 10# kettlebell       8/15: Manual therapy soft tissue mobilization to upper trap, masseter, SCM, scalene  Trigger Point Dry-Needling  Treatment instructions: Expect mild to moderate muscle soreness. S/S of pneumothorax if dry needled over a lung field, and to seek immediate medical attention should they occur. Patient verbalized  understanding of these instructions and education.  Patient Consent Given: Yes Education handout provided: Previously provided Muscles treated: right masseter, right SCM and scalenes, upper trap right Electrical stimulation performed: No Parameters: N/A Treatment response/outcome: improved soft tissue mobility  Therapeutic ex: foam roll cervical rotation, circles, supine thoracic extension, supine lying on roll vertically  with UE elevation with coordinated breathing; open books 5-10x each Moist heat 2 min post DN   PATIENT EDUCATION:  Education details: anti inflammatory strategies; dry needling after care Person educated: Patient Education method: Explanation Education comprehension: verbalized understanding   HOME EXERCISE PROGRAM:  Access Code: [email or web address] URL: [email or web address] Date: 03/28/2022 Prepared by: Ruben Im  Exercises - Neck Mobilization with Foam Roller  - 1 x daily - 7 x weekly - 1 sets - 10 reps - Supine Static Chest Stretch on Foam Roll  - 1 x daily - 7 x weekly - 3 sets - 10 reps - Overhead Reach on Foam Roll  - 1 x daily - 7 x weekly - 1 sets - 10 reps - Thoracic Mobilization with Hands Behind Head on Foam Roll  - 1 x daily - 7 x weekly - 1 sets - 10 reps - Sidelying Open Book Thoracic Rotation with Knee on Foam Roll  - 1 x daily - 7 x weekly - 1 sets - 10 reps - Supine Deep Neck Flexor Training  - 1 x daily - 7 x weekly - 1 sets - 10 reps - Seated Thoracic Lumbar Extension with Pectoralis Stretch  - 1 x daily - 7 x weekly - 1 sets - 10 reps - Standing Row with Anchored Resistance  - 1 x daily - 7 x weekly - 2 sets - 10 reps - Shoulder extension with resistance - Neutral  - 1 x daily - 7 x weekly - 2 sets - 10 reps - Standing Shoulder Flexion to 90 Degrees with Dumbbells  - 1 x daily - 7 x weekly - 1 sets - 8 reps - Half Deadlift with Kettlebell  - 1 x daily - 7 x weekly - 1 sets - 10 reps - Farmer's Carry with Kettlebells  - 1 x daily - 7  x weekly - 10 reps  Added today 04/02/22: Cervical SELF SNAGS  ASSESSMENT:  CLINICAL IMPRESSION: Pt compliant with all HEP. Pt presents with stiff neck but not painful. She thinks the weather is impacting. Active rotation today improved: see above chart. Pt was much more relaxed/less tissue tension upon leaving clinic today.      OBJECTIVE IMPAIRMENTS decreased activity tolerance, decreased ROM, decreased strength, increased fascial restrictions, increased muscle spasms, and pain.   ACTIVITY LIMITATIONS carrying, lifting, sitting, standing, and caring for others  PARTICIPATION LIMITATIONS: cleaning, driving, and occupation  PERSONAL FACTORS Past/current experiences, Profession, Time since onset of injury/illness/exacerbation, and 1-2 comorbidities: depression,asthma, fibromyalgia  are also affecting patient's functional outcome.   REHAB POTENTIAL: Good  CLINICAL DECISION MAKING: Evolving/moderate complexity  EVALUATION COMPLEXITY: Moderate   GOALS: Goals reviewed with patient? Yes  SHORT TERM GOALS: Target date: 04/12/2022   The patient will demonstrate knowledge of basic self care strategies and exercises to promote healing   Baseline:  Goal status: INITIAL  2.  The patient will report a 30% improvement in pain levels with functional activities which are currently difficult including home and work tasks Baseline:  Goal status: INITIAL  3.  Improved cervical rotation to 60 degrees bil needed for driving Baseline:  Goal status: INITIAL   LONG TERM GOALS: Target date: 05/10/2022  The patient will be independent in a safe self progression of a home exercise program to promote further recovery of function   Baseline:  Goal status: INITIAL  2.  The patient will report a 60% improvement in pain levels with functional activities which are currently difficult including work tasks Baseline:  Goal status: INITIAL  3.  Improved cervical sidebending ROM to 52 degrees and rotation  to 60 degrees needed for driving and work duties Baseline:  Goal status: INITIAL  4.  Improved cervical, scapular and glenohumeral strength to grossly 4+/5 needed for home and work ADLs Baseline:  Goal status: INITIAL  5.  The patient will have improved FOTO score to  58%     indicating improved function with less pain  Baseline:  Goal status: INITIAL   PLAN: PT FREQUENCY: 1-2x/week  PT DURATION: 8 weeks  PLANNED INTERVENTIONS: Therapeutic exercises, Therapeutic activity, Neuromuscular re-education, Patient/Family education, Self Care, Joint mobilization, Aquatic Therapy, Dry Needling, Electrical stimulation, Spinal mobilization, Cryotherapy, Moist heat, Taping, Traction, Ultrasound, Ionotophoresis 52m/ml Dexamethasone, Manual therapy, and Re-evaluation  PLAN FOR NEXT SESSION: deep cervical flexor neuromuscular re-ed;  strengthening/muscle pumping for chronic pain control;  patient likes low reps but heavier loads more than light weights/high reps;   manual therapy  or DN to scalenes, masseter and temporalis; may try DN with ES to posterior cervical muscles, review and progress HEP;  pain education;  diaphragmatic breathing to decrease scalene and SCM muscle overuse  JLupie Sawa PTA 04/02/22 1:08 PM Phone: [phone]: [phone]

## 2022-04-11 ENCOUNTER — Encounter: Payer: Managed Care, Other (non HMO) | Admitting: Physical Therapy

## 2022-04-17 ENCOUNTER — Ambulatory Visit: Payer: Managed Care, Other (non HMO) | Attending: Family Medicine | Admitting: Physical Therapy

## 2022-04-17 DIAGNOSIS — M542 Cervicalgia: Secondary | ICD-10-CM | POA: Diagnosis present

## 2022-04-17 DIAGNOSIS — M6281 Muscle weakness (generalized): Secondary | ICD-10-CM | POA: Insufficient documentation

## 2022-04-17 DIAGNOSIS — R252 Cramp and spasm: Secondary | ICD-10-CM | POA: Insufficient documentation
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
## 2022-04-20 ENCOUNTER — Ambulatory Visit: Payer: Managed Care, Other (non HMO) | Admitting: Physical Therapy

## 2022-04-20 DIAGNOSIS — M542 Cervicalgia: Secondary | ICD-10-CM | POA: Diagnosis not present

## 2022-04-20 DIAGNOSIS — M6281 Muscle weakness (generalized): Secondary | ICD-10-CM

## 2022-04-20 DIAGNOSIS — R252 Cramp and spasm: Secondary | ICD-10-CM

## 2022-04-20 NOTE — Therapy (Signed)
OUTPATIENT PHYSICAL THERAPY CERVICAL PROGRESS NOTE Patient Name: Adrienne Williams MRN: [id number] DOB:Dec 14, 1980, 41 y.o., female Today's Date: 04/20/2022   PT End of Session - 04/20/22 0756     Visit Number 6    Date for PT Re-Evaluation 05/10/22    Authorization Type [employer]    PT Start Time 0800    PT Stop Time 0840    PT Time Calculation (min) 40 min    Activity Tolerance Patient tolerated treatment well              Past Medical History:  Diagnosis Date   Anxiety    Asthma    Depression    Family history of breast cancer    Frank breech presentation 05/29/2018   Hemorrhoids    Hx of varicella    PONV (postoperative nausea and vomiting)    "little nauseated"   Postpartum care following cesarean delivery (6/14) 01/18/2016   Past Surgical History:  Procedure Laterality Date   CESAREAN SECTION N/A 01/18/2016   Procedure: Primary CESAREAN SECTION;  Surgeon: Princess Bruins, MD;  Location: [place];  Service: Obstetrics;  Laterality: N/A;  EDD: 01/25/16   CESAREAN SECTION N/A 05/29/2018   Procedure: Repeat CESAREAN SECTION;  Surgeon: Azucena Fallen, MD;  Location: [place];  Service: Obstetrics;  Laterality: N/A;  EDD: 06/10/18   CYSTECTOMY  2002   pilonidal region   DIAGNOSTIC LAPAROSCOPY WITH REMOVAL OF ECTOPIC PREGNANCY Right 05/29/2020   Procedure: DIAGNOSTIC LAPAROSCOPY WITH REMOVAL OF ECTOPIC PREGNANCY;  Surgeon: Azucena Fallen, MD;  Location: [place];  Service: Gynecology;  Laterality: Right;   MOLE REMOVAL     UNILATERAL SALPINGECTOMY Right 05/29/2020   Procedure: UNILATERAL SALPINGECTOMY;  Surgeon: Azucena Fallen, MD;  Location: [place];  Service: Gynecology;  Laterality: Right;   WISDOM TOOTH EXTRACTION     Patient Active Problem List   Diagnosis Date Noted   Family history of breast cancer 03/00/9233   Monoallelic mutation of CHEK2 gene in female patient 01/18/2022   Anxiety and depression 05/31/2018   Rh negative, maternal 05/31/2018    Pilar Plate breech presentation 05/29/2018   Status post repeat low transverse cesarean section 05/29/2018   Postpartum care following cesarean delivery (10/24) 05/29/2018    PCP: Marda Stalker PA  REFERRING PROVIDER: Angelina Pih MD  REFERRING DIAG: M54.2 chronic neck pain   THERAPY DIAG:  Cervicalgia; spasm; weakness  Rationale for Evaluation and Treatment Rehabilitation  ONSET DATE: "Forever" 08/06/21  SUBJECTIVE:  SUBJECTIVE STATEMENT: A bit stiff (thoracic rotation).  The doctor wants me    PERTINENT HISTORY:  Asthma; fibromyalgia; depression (pt feels this is well controlled); restarted Baclofen  Has a kettlebell at home; bands and dumbbells 2# and 5# and 15# at home  PAIN:  Are you having pain? 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 No  LIVING ENVIRONMENT: Lives with: lives with their family Lives in: House/apartment  OCCUPATION: full time pharmacist ACTIVITY:  [place] riding 1x/week, run in the backyard 2x/week;  agility with the dog  PLOF: Independent  PATIENT GOALS for the pain not make me cry.  I cry when I'm by myself .  OBJECTIVE:   DIAGNOSTIC FINDINGS:  none  PATIENT SURVEYS:  FOTO 42%   COGNITION: Overall cognitive status: Within functional limits for tasks assessed   POSTURE: mild head forward; prominent scalenes and SCM  PALPATION: Cervical joint mobility WNLS;  decreased soft  tissue mobility upper traps, suboccipitals; masseters right > left  CERVICAL ROM:   Active ROM A/PROM (deg) eval A/ROM 04/02/22 May 04, 2023:  Flexion 45  42  Extension 57  62  Right lateral flexion 48  52  Left lateral flexion 44  48  Right rotation 50 59 55  Left rotation 57 60 60   (Blank rows = not tested)   Decreased thoracic extension and rotation mobility  UPPER EXTREMITY ROM: WFLS , reports right shoulder clicking  UPPER EXTREMITY MMT:   Decreased strength cervical extensors and deep cervical flexors, middle and lower traps grossly 4-/5  CERVICAL SPECIAL TESTS:  Distraction test: Negative    TODAY'S TREATMENT:  9/15: Supine lying on foam roll: UE elevation  Thoracic extension over foam roll supine 10x  Quadruped thread the needle to open book 8x each side  Childs pose with foam roll 5x Half kneel thoracic rotation on the wall 8x right/left  Standing blue band "archery" row and rotate with turning head to follow elbow 10x each way 10# kettlebell 90 degrees holds with walk 6f 2x each side 2 8# dumbells dead lifts 2x8  suitcase hold and front thighs  2 8# suitcase carry 2x 10 feet 8# snatch and press overhead  8x each side Holding 8# at the shoulder with BOSU step taps 2x10 Therapeutic activities:  standing tolerance, lifting, pushing, pulling      9September 28, 2024 Standing dead lifts to cone between feet 20# kettlebell 10x 5# snatch and press overhead 10x right/left Blue band over top of door lat pull downs 10x Blue band over top of door tricep extensions 10x  Blue band over top of door shoulder extension 10x  Self Snags with towel for rotation 10x Bil Thoracic extension over roll 5x Prone over green ball 2# row, rotate and press overhead 10x Manual therapy soft tissue mobilization to right cervical paraspinals, cervical joint mobs grade 3 PA, rotation and lateral right and left C4-C7 15sec each level Trigger Point Dry-Needling  Treatment instructions: Expect mild to  moderate muscle soreness. S/S of pneumothorax if dry needled over a lung field, and to seek immediate medical attention should they occur. Patient verbalized understanding of these instructions and education.  Patient  Consent Given: Yes Education handout provided: Previously provided Muscles treated: right cervical multifidi Electrical stimulation performed: No Parameters: N/A Treatment response/outcome: improved soft tissue mobility    PATIENT EDUCATION:  Education details: anti inflammatory strategies; dry needling after care Person educated: Patient Education method: Explanation Education comprehension: verbalized understanding   HOME EXERCISE PROGRAM:  Access Code: [email or web address] URL: [email or web address] Date: 03/28/2022 Prepared by: Ruben Im  Exercises - Neck Mobilization with Foam Roller  - 1 x daily - 7 x weekly - 1 sets - 10 reps - Supine Static Chest Stretch on Foam Roll  - 1 x daily - 7 x weekly - 3 sets - 10 reps - Overhead Reach on Foam Roll  - 1 x daily - 7 x weekly - 1 sets - 10 reps - Thoracic Mobilization with Hands Behind Head on Foam Roll  - 1 x daily - 7 x weekly - 1 sets - 10 reps - Sidelying Open Book Thoracic Rotation with Knee on Foam Roll  - 1 x daily - 7 x weekly - 1 sets - 10 reps - Supine Deep Neck Flexor Training  - 1 x daily - 7 x weekly - 1 sets - 10 reps - Seated Thoracic Lumbar Extension with Pectoralis Stretch  - 1 x daily - 7 x weekly - 1 sets - 10 reps - Standing Row with Anchored Resistance  - 1 x daily - 7 x weekly - 2 sets - 10 reps - Shoulder extension with resistance - Neutral  - 1 x daily - 7 x weekly - 2 sets - 10 reps - Standing Shoulder Flexion to 90 Degrees with Dumbbells  - 1 x daily - 7 x weekly - 1 sets - 8 reps - Half Deadlift with Kettlebell  - 1 x daily - 7 x weekly - 1 sets - 10 reps - Farmer's Carry with Kettlebells  - 1 x daily - 7 x weekly - 10 reps  Added today 04/02/22: Cervical SELF  SNAGS  ASSESSMENT:  CLINICAL IMPRESSION: Good response to thoracic extension and rotation mobility in addition to focus on loaded strengthening.  Verbal and tactile cues for proper recruitment of scapular muscles to optimize benefit.  Responds well to low reps/high intensity ex.      OBJECTIVE IMPAIRMENTS decreased activity tolerance, decreased ROM, decreased strength, increased fascial restrictions, increased muscle spasms, and pain.   ACTIVITY LIMITATIONS carrying, lifting, sitting, standing, and caring for others  PARTICIPATION LIMITATIONS: cleaning, driving, and occupation  PERSONAL FACTORS Past/current experiences, Profession, Time since onset of injury/illness/exacerbation, and 1-2 comorbidities: depression,asthma, fibromyalgia  are also affecting patient's functional outcome.   REHAB POTENTIAL: Good  CLINICAL DECISION MAKING: Evolving/moderate complexity  EVALUATION COMPLEXITY: Moderate   GOALS: Goals reviewed with patient? Yes  SHORT TERM GOALS: Target date: 04/12/2022   The patient will demonstrate knowledge of basic self care strategies and exercises to promote healing   Baseline:  Goal status: goal met 9/12 2.  The patient will report a 30% improvement in pain levels with functional activities which are currently difficult including home and work tasks Baseline:  Goal status: ongoing  3.  Improved cervical rotation to 60 degrees bil needed for driving Baseline:  Goal status: partially met/ongoing   LONG TERM GOALS: Target date: 05/10/2022  The patient will be independent in a safe self progression of a home exercise program to promote further recovery of function   Baseline:  Goal status: INITIAL  2.  The patient will report a 60% improvement in pain  levels with functional activities which are currently difficult including work tasks Baseline:  Goal status: INITIAL  3.  Improved cervical sidebending ROM to 52 degrees and rotation to 60 degrees needed for  driving and work duties Baseline:  Goal status: INITIAL  4.  Improved cervical, scapular and glenohumeral strength to grossly 4+/5 needed for home and work ADLs Baseline:  Goal status: INITIAL  5.  The patient will have improved FOTO score to  58%     indicating improved function with less pain  Baseline:  Goal status: INITIAL   PLAN: PT FREQUENCY: 1-2x/week  PT DURATION: 8 weeks  PLANNED INTERVENTIONS: Therapeutic exercises, Therapeutic activity, Neuromuscular re-education, Patient/Family education, Self Care, Joint mobilization, Aquatic Therapy, Dry Needling, Electrical stimulation, Spinal mobilization, Cryotherapy, Moist heat, Taping, Traction, Ultrasound, Ionotophoresis 56m/ml Dexamethasone, Manual therapy, and Re-evaluation  PLAN FOR NEXT SESSION:  strengthening/muscle pumping for chronic pain control;  low reps but heavier loads more than light weights/high reps;   manual therapy  or DN to scalenes, masseter and temporalis;DN;  review and progress HEP  SRuben Im PT 04/20/22 8:45 AM Phone: [phone]: [phone]

## 2022-04-25 ENCOUNTER — Ambulatory Visit: Payer: Managed Care, Other (non HMO) | Admitting: Physical Therapy

## 2022-04-25 DIAGNOSIS — R252 Cramp and spasm: Secondary | ICD-10-CM

## 2022-04-25 DIAGNOSIS — M542 Cervicalgia: Secondary | ICD-10-CM | POA: Diagnosis not present

## 2022-04-25 DIAGNOSIS — M6281 Muscle weakness (generalized): Secondary | ICD-10-CM

## 2022-04-25 NOTE — Therapy (Signed)
OUTPATIENT PHYSICAL THERAPY CERVICAL PROGRESS NOTE Patient Name: Adrienne Williams MRN: [id number] DOB:07-05-1981, 41 y.o., female Today's Date: 04/25/2022   PT End of Session - 04/25/22 0756     Visit Number 7    Date for PT Re-Evaluation 05/10/22    Authorization Type [employer]    PT Start Time 0800    PT Stop Time 0840    PT Time Calculation (min) 40 min    Activity Tolerance Patient tolerated treatment well              Past Medical History:  Diagnosis Date   Anxiety    Asthma    Depression    Family history of breast cancer    Frank breech presentation 05/29/2018   Hemorrhoids    Hx of varicella    PONV (postoperative nausea and vomiting)    "little nauseated"   Postpartum care following cesarean delivery (6/14) 01/18/2016   Past Surgical History:  Procedure Laterality Date   CESAREAN SECTION N/A 01/18/2016   Procedure: Primary CESAREAN SECTION;  Surgeon: Princess Bruins, MD;  Location: [place];  Service: Obstetrics;  Laterality: N/A;  EDD: 01/25/16   CESAREAN SECTION N/A 05/29/2018   Procedure: Repeat CESAREAN SECTION;  Surgeon: Azucena Fallen, MD;  Location: [place];  Service: Obstetrics;  Laterality: N/A;  EDD: 06/10/18   CYSTECTOMY  2002   pilonidal region   DIAGNOSTIC LAPAROSCOPY WITH REMOVAL OF ECTOPIC PREGNANCY Right 05/29/2020   Procedure: DIAGNOSTIC LAPAROSCOPY WITH REMOVAL OF ECTOPIC PREGNANCY;  Surgeon: Azucena Fallen, MD;  Location: [place];  Service: Gynecology;  Laterality: Right;   MOLE REMOVAL     UNILATERAL SALPINGECTOMY Right 05/29/2020   Procedure: UNILATERAL SALPINGECTOMY;  Surgeon: Azucena Fallen, MD;  Location: [place];  Service: Gynecology;  Laterality: Right;   WISDOM TOOTH EXTRACTION     Patient Active Problem List   Diagnosis Date Noted   Family history of breast cancer 69/50/7225   Monoallelic mutation of CHEK2 gene in female patient 01/18/2022   Anxiety and depression 05/31/2018   Rh negative, maternal 05/31/2018    Pilar Plate breech presentation 05/29/2018   Status post repeat low transverse cesarean section 05/29/2018   Postpartum care following cesarean delivery (10/24) 05/29/2018    PCP: Marda Stalker PA  REFERRING PROVIDER: Angelina Pih MD  REFERRING DIAG: M54.2 chronic neck pain   THERAPY DIAG:  Cervicalgia; spasm; weakness  Rationale for Evaluation and Treatment Rehabilitation  ONSET DATE: "Forever" 08/06/21  SUBJECTIVE:  SUBJECTIVE STATEMENT: Tight bil upper traps.  Less tightness in thoracic.  PERTINENT HISTORY:  Asthma; fibromyalgia; depression (pt feels this is well controlled); restarted Baclofen  Has a kettlebell at home; bands and dumbbells 2# and 5# and 15# at home  PAIN:  Are you having pain? 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 No  LIVING ENVIRONMENT: Lives with: lives with their family Lives in: House/apartment  OCCUPATION: full time pharmacist ACTIVITY:  [place] riding 1x/week, run in the backyard 2x/week;  agility with the dog  PLOF: Independent  PATIENT GOALS for the pain not make me cry.  I cry when I'm by myself .  OBJECTIVE:   DIAGNOSTIC FINDINGS:  none  PATIENT SURVEYS:  FOTO 42%   COGNITION: Overall cognitive status: Within functional limits for tasks assessed   POSTURE: mild head forward; prominent scalenes and SCM  PALPATION: Cervical joint mobility WNLS;  decreased soft tissue  mobility upper traps, suboccipitals; masseters right > left  CERVICAL ROM:   Active ROM A/PROM (deg) eval A/ROM 04/02/22 9/12:  Flexion 45  42  Extension 57  62  Right lateral flexion 48  52  Left lateral flexion 44  48  Right rotation 50 59 55  Left rotation 57 60 60   (Blank rows = not tested)   Decreased thoracic extension and rotation mobility  UPPER EXTREMITY ROM: WFLS , reports right shoulder clicking  UPPER EXTREMITY MMT:   Decreased strength cervical extensors and deep cervical flexors, middle and lower traps grossly 4-/5  CERVICAL SPECIAL TESTS:  Distraction test: Negative    TODAY'S TREATMENT:  9/20: UBE 1 min each forward/backward Circuit style:  Bent rows quadruped 8# 10x right/left Counter push ups 10x 8# snatch/press overhead 5x right/left BOSU taps with 8# weight at shoulder 10x right/left Cable rows 15# 10x 2nd circuit: Archer row and rotate 10x right/left Table planks knee taps 5x 20# kettlebell dead lifts 10x Light green loop around wrists wall slides/lift offs at top 5x Cable row 15# 10x Manual therapy soft tissue mobilization to bil cervical paraspinals Trigger Point Dry-Needling  Treatment instructions: Expect mild to moderate muscle soreness. S/S of pneumothorax if dry needled over a lung field, and to seek immediate medical attention should they occur. Patient verbalized understanding of these instructions and education.  Patient Consent Given: Yes Education handout provided: Previously provided Muscles treated: right/left cervical multifidi and upper traps Electrical stimulation performed: No Parameters: N/A Treatment response/outcome: improved soft tissue mobility       9/15: Supine lying on foam roll: UE elevation  Thoracic extension over foam roll supine 10x  Quadruped thread the needle to open book 8x each side  Childs pose with foam roll 5x Half kneel thoracic rotation on the wall 8x right/left  Standing blue band "archery" row  and rotate with turning head to follow elbow 10x each way 10# kettlebell 90 degrees holds with walk 39f 2x each side 2 8# dumbells dead lifts 2x8  suitcase hold and front thighs  2 8# suitcase carry 2x 10 feet 8# snatch and press overhead  8x each side Holding 8# at the shoulder with BOSU step taps 2x10  Therapeutic activities:  standing tolerance, lifting, pushing, pulling        PATIENT EDUCATION:  Education details: anti inflammatory strategies; dry needling after care Person educated: Patient Education method: Explanation Education comprehension: verbalized understanding   HOME EXERCISE PROGRAM:  Access Code: [email or web address] URL: [email or web address] Date: 03/28/2022 Prepared by: Ruben Im  Exercises - Neck Mobilization with Foam Roller  - 1 x daily - 7 x weekly - 1 sets - 10 reps - Supine Static Chest Stretch on Foam Roll  - 1 x daily - 7 x weekly - 3 sets - 10 reps - Overhead Reach on Foam Roll  - 1 x daily - 7 x weekly - 1 sets - 10 reps - Thoracic Mobilization with Hands Behind Head on Foam Roll  - 1 x daily - 7 x weekly - 1 sets - 10 reps - Sidelying Open Book Thoracic Rotation with Knee on Foam Roll  - 1 x daily - 7 x weekly - 1 sets - 10 reps - Supine Deep Neck Flexor Training  - 1 x daily - 7 x weekly - 1 sets - 10 reps - Seated Thoracic Lumbar Extension with Pectoralis Stretch  - 1 x daily - 7 x weekly - 1 sets - 10 reps - Standing Row with Anchored Resistance  - 1 x daily - 7 x weekly - 2 sets - 10 reps - Shoulder extension with resistance - Neutral  - 1 x daily - 7 x weekly - 2 sets - 10 reps - Standing Shoulder Flexion to 90 Degrees with Dumbbells  - 1 x daily - 7 x weekly - 1 sets - 8 reps - Half Deadlift with Kettlebell  - 1 x daily - 7 x weekly - 1 sets - 10 reps - Farmer's Carry with Kettlebells  - 1 x daily - 7 x weekly - 10 reps  Added today 04/02/22: Cervical SELF SNAGS  ASSESSMENT:  CLINICAL IMPRESSION: Good response to trial of circuit  style strengthening with decreased pain and tightness reported overall.  Movement restriction noted with bil cervical sidebending with tender points identified in upper traps.  Responds well with myofascial release with DN and manual therapy.  Therapist monitoring response to all interventions and modifying treatment accordingly.      OBJECTIVE IMPAIRMENTS decreased activity tolerance, decreased ROM, decreased strength, increased fascial restrictions, increased muscle spasms, and pain.   ACTIVITY LIMITATIONS carrying, lifting, sitting, standing, and caring for others  PARTICIPATION LIMITATIONS: cleaning, driving, and occupation  PERSONAL FACTORS Past/current experiences, Profession, Time since onset of injury/illness/exacerbation, and 1-2 comorbidities: depression,asthma, fibromyalgia  are also affecting patient's functional outcome.   REHAB POTENTIAL: Good  CLINICAL DECISION MAKING: Evolving/moderate complexity  EVALUATION COMPLEXITY: Moderate   GOALS: Goals reviewed with patient? Yes  SHORT TERM GOALS: Target date: 04/12/2022   The patient will demonstrate knowledge of basic self care strategies and exercises to promote healing   Baseline:  Goal status: goal met 9/12 2.  The patient will report a 30% improvement in pain levels with functional activities which are currently difficult including home and work tasks Baseline:  Goal status: ongoing  3.  Improved cervical rotation to 60 degrees bil needed for driving Baseline:  Goal status: partially met/ongoing   LONG TERM GOALS: Target date: 05/10/2022  The patient will be independent in a safe self progression of a home exercise program to promote further recovery of function   Baseline:  Goal status: INITIAL  2.  The patient will report a 60% improvement  in pain levels with functional activities which are currently difficult including work tasks Baseline:  Goal status: INITIAL  3.  Improved cervical sidebending ROM to 52  degrees and rotation to 60 degrees needed for driving and work duties Baseline:  Goal status: INITIAL  4.  Improved cervical, scapular and glenohumeral strength to grossly 4+/5 needed for home and work ADLs Baseline:  Goal status: INITIAL  5.  The patient will have improved FOTO score to  58%     indicating improved function with less pain  Baseline:  Goal status: INITIAL   PLAN: PT FREQUENCY: 1-2x/week  PT DURATION: 8 weeks  PLANNED INTERVENTIONS: Therapeutic exercises, Therapeutic activity, Neuromuscular re-education, Patient/Family education, Self Care, Joint mobilization, Aquatic Therapy, Dry Needling, Electrical stimulation, Spinal mobilization, Cryotherapy, Moist heat, Taping, Traction, Ultrasound, Ionotophoresis 73m/ml Dexamethasone, Manual therapy, and Re-evaluation  PLAN FOR NEXT SESSION:  circuit style strengthening; strengthening/muscle pumping for chronic pain control;  low reps but heavier loads more than light weights/high reps;   manual therapy  or DN to cervical muscles, scalenes, masseter and temporalis;DN;  review and progress HEP  SRuben Im PT 04/25/22 8:45 AM Phone: [phone]: [phone]

## 2022-05-03 ENCOUNTER — Encounter: Payer: Self-pay | Admitting: Physical Therapy

## 2022-05-03 ENCOUNTER — Ambulatory Visit: Payer: Managed Care, Other (non HMO) | Admitting: Physical Therapy

## 2022-05-03 DIAGNOSIS — M542 Cervicalgia: Secondary | ICD-10-CM | POA: Diagnosis not present

## 2022-05-03 DIAGNOSIS — M6281 Muscle weakness (generalized): Secondary | ICD-10-CM

## 2022-05-03 DIAGNOSIS — R252 Cramp and spasm: Secondary | ICD-10-CM

## 2022-05-03 NOTE — Therapy (Signed)
OUTPATIENT PHYSICAL THERAPY CERVICAL PROGRESS NOTE Patient Name: Adrienne Williams MRN: [id number] DOB:1981-03-27, 41 y.o., female Today's Date: 05/03/2022   PT End of Session - 05/03/22 1401     Visit Number 8    Date for PT Re-Evaluation 05/10/22    Authorization Type [employer]    PT Start Time 1401    PT Stop Time 1442    PT Time Calculation (min) 41 min    Activity Tolerance Patient tolerated treatment well    Behavior During Therapy [facility] for tasks assessed/performed               Past Medical History:  Diagnosis Date   Anxiety    Asthma    Depression    Family history of breast cancer    Frank breech presentation 05/29/2018   Hemorrhoids    Hx of varicella    PONV (postoperative nausea and vomiting)    "little nauseated"   Postpartum care following cesarean delivery (6/14) 01/18/2016   Past Surgical History:  Procedure Laterality Date   CESAREAN SECTION N/A 01/18/2016   Procedure: Primary CESAREAN SECTION;  Surgeon: Princess Bruins, MD;  Location: [place];  Service: Obstetrics;  Laterality: N/A;  EDD: 01/25/16   CESAREAN SECTION N/A 05/29/2018   Procedure: Repeat CESAREAN SECTION;  Surgeon: Azucena Fallen, MD;  Location: [place];  Service: Obstetrics;  Laterality: N/A;  EDD: 06/10/18   CYSTECTOMY  2002   pilonidal region   DIAGNOSTIC LAPAROSCOPY WITH REMOVAL OF ECTOPIC PREGNANCY Right 05/29/2020   Procedure: DIAGNOSTIC LAPAROSCOPY WITH REMOVAL OF ECTOPIC PREGNANCY;  Surgeon: Azucena Fallen, MD;  Location: [place];  Service: Gynecology;  Laterality: Right;   MOLE REMOVAL     UNILATERAL SALPINGECTOMY Right 05/29/2020   Procedure: UNILATERAL SALPINGECTOMY;  Surgeon: Azucena Fallen, MD;  Location: [place];  Service: Gynecology;  Laterality: Right;   WISDOM TOOTH EXTRACTION     Patient Active Problem List   Diagnosis Date Noted   Family history of breast cancer 93/81/0175   Monoallelic mutation of CHEK2 gene in female patient 01/18/2022   Anxiety  and depression 05/31/2018   Rh negative, maternal 05/31/2018   Pilar Plate breech presentation 05/29/2018   Status post repeat low transverse cesarean section 05/29/2018   Postpartum care following cesarean delivery (10/24) 05/29/2018    PCP: Marda Stalker PA  REFERRING PROVIDER: Angelina Pih MD  REFERRING DIAG: M54.2 chronic neck pain   THERAPY DIAG:  Cervicalgia; spasm; weakness  Rationale for Evaluation and Treatment Rehabilitation  ONSET DATE: "Forever" 08/06/21  SUBJECTIVE:  SUBJECTIVE STATEMENT: Pt reports pain into her skull at the temporal lobes today. She states that it started after last session.   PERTINENT HISTORY:  Asthma; fibromyalgia; depression (pt feels this is well controlled); restarted Baclofen  Has a kettlebell at home; bands and dumbbells 2# and 5# and 15# at home  PAIN:  Are you having pain? 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 No  LIVING ENVIRONMENT: Lives with: lives with their family Lives in: House/apartment  OCCUPATION: full time pharmacist ACTIVITY:  [place] riding 1x/week, run in the backyard 2x/week;  agility with the dog  PLOF: Independent  PATIENT GOALS for the pain not make me cry.  I cry when I'm by myself .  OBJECTIVE:   DIAGNOSTIC FINDINGS:  none  PATIENT SURVEYS:  FOTO 42%   COGNITION: Overall cognitive status: Within functional limits for tasks  assessed   POSTURE: mild head forward; prominent scalenes and SCM  PALPATION: Cervical joint mobility WNLS;  decreased soft tissue mobility upper traps, suboccipitals; masseters right > left  CERVICAL ROM:   Active ROM A/PROM (deg) eval A/ROM 04/02/22 9/12:  Flexion 45  42  Extension 57  62  Right lateral flexion 48  52  Left lateral flexion 44  48  Right rotation 50 59 55  Left rotation 57 60 60   (Blank rows = not tested)   Decreased thoracic extension and rotation mobility  UPPER EXTREMITY ROM: WFLS , reports right shoulder clicking  UPPER EXTREMITY MMT:   Decreased strength cervical extensors and deep cervical flexors, middle and lower traps grossly 4-/5  CERVICAL SPECIAL TESTS:  Distraction test: Negative    TODAY'S TREATMENT:  9/28: UBE 2 min each forward/backward SNAG's 10 x each direction Doorway pec stretch 2 x30 sec hold. Bilat  Chin tucks x 20 supine UT stretch 2 x 30 sec Bilat  LS stretch 2x 30 sec Bilat  Cable rows 15# x10  Manual therapy soft tissue mobilization to bil cervical paraspinals and suboccipital release.  Trigger Point Dry-Needling  Treatment instructions: Expect mild to moderate muscle soreness. S/S of pneumothorax if dry needled over a lung field, and to seek immediate medical attention should they occur. Patient verbalized understanding of these instructions and education Patient Consent Given: Yes Education handout provided: Previously provided Muscles treated: right/left Posterior upper traps, R LS Electrical stimulation performed: Yes to L side only  Parameters: Mod intensity, frequency 61m Treatment response/outcome: improved soft tissue mobility   9/20: UBE 2 min each forward/backward Circuit style:  Bent rows quadruped 8# 10x right/left Counter push ups 10x 8# snatch/press overhead 5x right/left BOSU taps with 8# weight at shoulder 10x right/left Cable rows 15# 10x 2nd circuit: Archer row and rotate 10x right/left Table  planks knee taps 5x 20# kettlebell dead lifts 10x Light green loop around wrists wall slides/lift offs at top 5x Cable row 15# 10x Manual therapy soft tissue mobilization to bil cervical paraspinals Trigger Point Dry-Needling  Treatment instructions: Expect mild to moderate muscle soreness. S/S of pneumothorax if dry needled over a lung field, and to seek immediate medical attention should they occur. Patient verbalized understanding of these instructions and  education.  Patient Consent Given: Yes Education handout provided: Previously provided Muscles treated: right/left cervical multifidi and upper traps Electrical stimulation performed: No Parameters: N/A Treatment response/outcome: improved soft tissue mobility   9/15: Supine lying on foam roll: UE elevation  Thoracic extension over foam roll supine 10x  Quadruped thread the needle to open book 8x each side  Childs pose with foam roll 5x Half kneel thoracic rotation on the wall 8x right/left  Standing blue band "archery" row and rotate with turning head to follow elbow 10x each way 10# kettlebell 90 degrees holds with walk 66f 2x each side 2 8# dumbells dead lifts 2x8  suitcase hold and front thighs  2 8# suitcase carry 2x 10 feet 8# snatch and press overhead  8x each side Holding 8# at the shoulder with BOSU step taps 2x10 Therapeutic activities:  standing tolerance, lifting, pushing, pulling    PATIENT EDUCATION:  Education details: anti inflammatory strategies; dry needling after care Person educated: Patient Education method: Explanation Education comprehension: verbalized understanding   HOME EXERCISE PROGRAM:  Access Code: [email or web address]: [email or web address] Date: 03/28/2022 Prepared by: SRuben Im Exercises - Neck Mobilization with Foam Roller  - 1 x daily - 7 x weekly - 1 sets - 10 reps - Supine Static Chest Stretch on Foam Roll  - 1 x daily - 7 x weekly - 3 sets - 10 reps - Overhead Reach  on Foam Roll  - 1 x daily - 7 x weekly - 1 sets - 10 reps - Thoracic Mobilization with Hands Behind Head on Foam Roll  - 1 x daily - 7 x weekly - 1 sets - 10 reps - Sidelying Open Book Thoracic Rotation with Knee on Foam Roll  - 1 x daily - 7 x weekly - 1 sets - 10 reps - Supine Deep Neck Flexor Training  - 1 x daily - 7 x weekly - 1 sets - 10 reps - Seated Thoracic Lumbar Extension with Pectoralis Stretch  - 1 x daily - 7 x weekly - 1 sets - 10 reps - Standing Row with Anchored Resistance  - 1 x daily - 7 x weekly - 2 sets - 10 reps - Shoulder extension with resistance - Neutral  - 1 x daily - 7 x weekly - 2 sets - 10 reps - Standing Shoulder Flexion to 90 Degrees with Dumbbells  - 1 x daily - 7 x weekly - 1 sets - 8 reps - Half Deadlift with Kettlebell  - 1 x daily - 7 x weekly - 1 sets - 10 reps - Farmer's Carry with Kettlebells  - 1 x daily - 7 x weekly - 10 reps  Added today 04/02/22: Cervical SELF SNAGS  ASSESSMENT:  CLINICAL IMPRESSION: Pt reports increased pain in her temporal lobes after last session. Discussed with pt possibility of grinding or clenching teeth that could be causing pain. Pt states that she wears a mouth guard but is unaware of if she is doing either. Good response e-stim to L shoulder today. Pt has increased tension in L UT> R with palpation. Followed up with STM to increased tension at L suboccipitals. Pt reports decreased pain upon standing, but increased tension pecs and minimal in UT's. Encouraged pt to stretch more throughout work day. She states that she doesn't have an ideal work station and is unable to change it. Demonstrated different stretches per pt request.  Rows with verbal and tactile cues due to overuse of her UT's. Pt reports  increased difficulty when activating her rhomboids today. Pt reports decreased overall pain at end of session.    OBJECTIVE IMPAIRMENTS decreased activity tolerance, decreased ROM, decreased strength, increased fascial restrictions,  increased muscle spasms, and pain.   ACTIVITY LIMITATIONS carrying, lifting, sitting, standing, and caring for others  PARTICIPATION LIMITATIONS: cleaning, driving, and occupation  PERSONAL FACTORS Past/current experiences, Profession, Time since onset of injury/illness/exacerbation, and 1-2 comorbidities: depression,asthma, fibromyalgia  are also affecting patient's functional outcome.   REHAB POTENTIAL: Good  CLINICAL DECISION MAKING: Evolving/moderate complexity  EVALUATION COMPLEXITY: Moderate   GOALS: Goals reviewed with patient? Yes  SHORT TERM GOALS: Target date: 04/12/2022   The patient will demonstrate knowledge of basic self care strategies and exercises to promote healing   Baseline:  Goal status: goal met 9/12 2.  The patient will report a 30% improvement in pain levels with functional activities which are currently difficult including home and work tasks Baseline:  Goal status: ongoing  3.  Improved cervical rotation to 60 degrees bil needed for driving Baseline:  Goal status: partially met/ongoing   LONG TERM GOALS: Target date: 05/10/2022  The patient will be independent in a safe self progression of a home exercise program to promote further recovery of function   Baseline:  Goal status: INITIAL  2.  The patient will report a 60% improvement in pain levels with functional activities which are currently difficult including work tasks Baseline:  Goal status: INITIAL  3.  Improved cervical sidebending ROM to 52 degrees and rotation to 60 degrees needed for driving and work duties Baseline:  Goal status: INITIAL  4.  Improved cervical, scapular and glenohumeral strength to grossly 4+/5 needed for home and work ADLs Baseline:  Goal status: INITIAL  5.  The patient will have improved FOTO score to  58%     indicating improved function with less pain  Baseline:  Goal status: INITIAL   PLAN: PT FREQUENCY: 1-2x/week  PT DURATION: 8 weeks  PLANNED  INTERVENTIONS: Therapeutic exercises, Therapeutic activity, Neuromuscular re-education, Patient/Family education, Self Care, Joint mobilization, Aquatic Therapy, Dry Needling, Electrical stimulation, Spinal mobilization, Cryotherapy, Moist heat, Taping, Traction, Ultrasound, Ionotophoresis 22m/ml Dexamethasone, Manual therapy, and Re-evaluation  PLAN FOR NEXT SESSION:  circuit style strengthening; strengthening/muscle pumping for chronic pain control;  low reps but heavier loads more than light weights/high reps;   manual therapy  or DN to cervical muscles, scalenes, masseter and temporalis;DN;  review and progress HEP  SRudi HeapPT, DPT 05/03/22  2:43 PM

## 2022-05-08 ENCOUNTER — Ambulatory Visit: Payer: Managed Care, Other (non HMO) | Attending: Family Medicine

## 2022-05-08 DIAGNOSIS — M542 Cervicalgia: Secondary | ICD-10-CM | POA: Diagnosis not present

## 2022-05-08 DIAGNOSIS — R252 Cramp and spasm: Secondary | ICD-10-CM | POA: Diagnosis present

## 2022-05-08 DIAGNOSIS — M6281 Muscle weakness (generalized): Secondary | ICD-10-CM | POA: Insufficient documentation

## 2022-05-08 NOTE — Therapy (Signed)
OUTPATIENT PHYSICAL THERAPY CERVICAL PROGRESS NOTE Patient Name: Adrienne Williams MRN: [id number] DOB:November 26, 1980, 41 y.o., female Today's Date: 05/08/2022   PT End of Session - 05/08/22 0843     Visit Number 9    Date for PT Re-Evaluation 05/10/22    Authorization Type [employer]    PT Start Time 0801    PT Stop Time 2641    PT Time Calculation (min) 43 min    Activity Tolerance Patient tolerated treatment well    Behavior During Therapy [facility] for tasks assessed/performed                Past Medical History:  Diagnosis Date   Anxiety    Asthma    Depression    Family history of breast cancer    Frank breech presentation 05/29/2018   Hemorrhoids    Hx of varicella    PONV (postoperative nausea and vomiting)    "little nauseated"   Postpartum care following cesarean delivery (6/14) 01/18/2016   Past Surgical History:  Procedure Laterality Date   CESAREAN SECTION N/A 01/18/2016   Procedure: Primary CESAREAN SECTION;  Surgeon: Princess Bruins, MD;  Location: [place];  Service: Obstetrics;  Laterality: N/A;  EDD: 01/25/16   CESAREAN SECTION N/A 05/29/2018   Procedure: Repeat CESAREAN SECTION;  Surgeon: Azucena Fallen, MD;  Location: [place];  Service: Obstetrics;  Laterality: N/A;  EDD: 06/10/18   CYSTECTOMY  2002   pilonidal region   DIAGNOSTIC LAPAROSCOPY WITH REMOVAL OF ECTOPIC PREGNANCY Right 05/29/2020   Procedure: DIAGNOSTIC LAPAROSCOPY WITH REMOVAL OF ECTOPIC PREGNANCY;  Surgeon: Azucena Fallen, MD;  Location: [place];  Service: Gynecology;  Laterality: Right;   MOLE REMOVAL     UNILATERAL SALPINGECTOMY Right 05/29/2020   Procedure: UNILATERAL SALPINGECTOMY;  Surgeon: Azucena Fallen, MD;  Location: [place];  Service: Gynecology;  Laterality: Right;   WISDOM TOOTH EXTRACTION     Patient Active Problem List   Diagnosis Date Noted   Family history of breast cancer 58/30/9407   Monoallelic mutation of CHEK2 gene in female patient 01/18/2022   Anxiety  and depression 05/31/2018   Rh negative, maternal 05/31/2018   Pilar Plate breech presentation 05/29/2018   Status post repeat low transverse cesarean section 05/29/2018   Postpartum care following cesarean delivery (10/24) 05/29/2018    PCP: Marda Stalker PA  REFERRING PROVIDER: Angelina Pih MD  REFERRING DIAG: M54.2 chronic neck pain   THERAPY DIAG:  Cervicalgia; spasm; weakness  Rationale for Evaluation and Treatment Rehabilitation  ONSET DATE: "Forever" 08/06/21  SUBJECTIVE:  SUBJECTIVE STATEMENT: I feel beat up by work and I'm feeling achy.  Needling is helping me.   PERTINENT HISTORY:  Asthma; fibromyalgia; depression (pt feels this is well controlled); restarted Baclofen  Has a kettlebell at home; bands and dumbbells 2# and 5# and 15# at home  PAIN:  Are you having pain? 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 No  LIVING ENVIRONMENT: Lives with: lives with their family Lives in: House/apartment  OCCUPATION: full time pharmacist ACTIVITY:  [place] riding 1x/week, run in the backyard 2x/week;  agility with the dog  PLOF: Independent  PATIENT GOALS for the pain not make me cry.  I cry when I'm by myself .  OBJECTIVE:   DIAGNOSTIC FINDINGS:  none  PATIENT SURVEYS:  FOTO 42%  COGNITION: Overall cognitive status: Within functional limits for tasks assessed   POSTURE: mild head forward; prominent  scalenes and SCM  PALPATION: Cervical joint mobility WNLS;  decreased soft tissue mobility upper traps, suboccipitals; masseters right > left  CERVICAL ROM:   Active ROM A/PROM (deg) eval A/ROM 04/02/22 9/12: 05/08/22  Flexion 45  42   Extension 57  62   Right lateral flexion 48  52 50  Left lateral flexion 44  48 50  Right rotation 50 59 55 60  Left rotation 57 60 60 60   (Blank rows = not tested)   Decreased thoracic extension and rotation mobility  UPPER EXTREMITY ROM: WFLS , reports right shoulder clicking  UPPER EXTREMITY MMT:   Decreased strength cervical extensors and deep cervical flexors, middle and lower traps grossly 4-/5  CERVICAL SPECIAL TESTS:  Distraction test: Negative    TODAY'S TREATMENT:  05/08/22: UBE 2.5 min each forward/backward-PT present to discuss progress 3 way raises: 1# added 2x10 LS stretch 2x 30 sec Bilat  Cable rows 15# x10  Trigger Point Dry-Needling  Treatment instructions: Expect mild to moderate muscle soreness. S/S of pneumothorax if dry needled over a lung field, and to seek immediate medical attention should they occur. Patient verbalized understanding of these instructions and education Patient Consent Given: Yes Education handout provided: Previously provided Muscles treated: bil cervical multifidi and T1-3, Bil upper traps, bil suboccipitals Elongation after needling to neck and upper traps  Treatment response/outcome: improved soft tissue mobility  9/28: UBE 2 min each forward/backward SNAG's 10 x each direction Doorway pec stretch 2 x30 sec hold. Bilat  Chin tucks x 20 supine UT stretch 2 x 30 sec Bilat  LS stretch 2x 30 sec Bilat  Cable rows 15# x10  Manual therapy soft tissue mobilization to bil cervical paraspinals and suboccipital release.  Trigger Point Dry-Needling  Treatment instructions: Expect mild to moderate muscle soreness. S/S of pneumothorax if dry needled over a lung field, and to seek immediate medical  attention should they occur. Patient verbalized understanding of these instructions and education Patient Consent Given: Yes Education handout provided: Previously provided Muscles treated: right/left Posterior upper traps, R LS Electrical stimulation performed: Yes to L side only  Parameters: Mod intensity, frequency 23m Treatment response/outcome: improved soft tissue mobility   9/20: UBE 2 min each forward/backward Circuit style:  Bent rows  quadruped 8# 10x right/left Counter push ups 10x 8# snatch/press overhead 5x right/left BOSU taps with 8# weight at shoulder 10x right/left Cable rows 15# 10x 2nd circuit: Archer row and rotate 10x right/left Table planks knee taps 5x 20# kettlebell dead lifts 10x Light green loop around wrists wall slides/lift offs at top 5x Cable row 15# 10x Manual therapy soft tissue mobilization to bil cervical paraspinals Trigger Point Dry-Needling  Treatment instructions: Expect mild to moderate muscle soreness. S/S of pneumothorax if dry needled over a lung field, and to seek immediate medical attention should they occur. Patient verbalized understanding of these instructions and education.  Patient Consent Given: Yes Education handout provided: Previously provided Muscles treated: right/left cervical multifidi and upper traps Electrical stimulation performed: No Parameters: N/A Treatment response/outcome: improved soft tissue mobility   PATIENT EDUCATION:  Education details: anti inflammatory strategies; dry needling after care Person educated: Patient Education method: Explanation Education comprehension: verbalized understanding   HOME EXERCISE PROGRAM:  Access Code: [email or web address] URL: [email or web address] Date: 03/28/2022 Prepared by: Ruben Im  Exercises - Neck Mobilization with Foam Roller  - 1 x daily - 7 x weekly - 1 sets - 10 reps - Supine Static Chest Stretch on Foam Roll  - 1 x daily - 7 x weekly - 3 sets - 10  reps - Overhead Reach on Foam Roll  - 1 x daily - 7 x weekly - 1 sets - 10 reps - Thoracic Mobilization with Hands Behind Head on Foam Roll  - 1 x daily - 7 x weekly - 1 sets - 10 reps - Sidelying Open Book Thoracic Rotation with Knee on Foam Roll  - 1 x daily - 7 x weekly - 1 sets - 10 reps - Supine Deep Neck Flexor Training  - 1 x daily - 7 x weekly - 1 sets - 10 reps - Seated Thoracic Lumbar Extension with Pectoralis Stretch  - 1 x daily - 7 x weekly - 1 sets - 10 reps - Standing Row with Anchored Resistance  - 1 x daily - 7 x weekly - 2 sets - 10 reps - Shoulder extension with resistance - Neutral  - 1 x daily - 7 x weekly - 2 sets - 10 reps - Standing Shoulder Flexion to 90 Degrees with Dumbbells  - 1 x daily - 7 x weekly - 1 sets - 8 reps - Half Deadlift with Kettlebell  - 1 x daily - 7 x weekly - 1 sets - 10 reps - Farmer's Carry with Kettlebells  - 1 x daily - 7 x weekly - 10 reps  Added today 04/02/22: Cervical SELF SNAGS  ASSESSMENT:  CLINICAL IMPRESSION: 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  PT encouraged pt to take breaks and stretch UT and levator throughout the day.  Pt requires tactile and verbal cues with exercise to reduce scapular elevation/activation. Pt with tension in neck and upper traps and had good response to DN with improved tissue mobility and reduced tension after manual therapy.  Patient will benefit from skilled PT to address the below impairments and improve overall function.    OBJECTIVE IMPAIRMENTS decreased activity tolerance, decreased ROM, decreased strength, increased fascial restrictions, increased muscle spasms, and pain.   ACTIVITY LIMITATIONS carrying, lifting, sitting, standing, and caring for others  PARTICIPATION LIMITATIONS: cleaning, driving, and occupation  PERSONAL FACTORS Past/current experiences, Profession, Time since onset of injury/illness/exacerbation, and 1-2 comorbidities: depression,asthma, fibromyalgia  are also  affecting patient's functional outcome.  REHAB POTENTIAL: Good  CLINICAL DECISION MAKING: Evolving/moderate complexity  EVALUATION COMPLEXITY: Moderate   GOALS: Goals reviewed with patient? Yes  SHORT TERM GOALS: Target date: 04/12/2022   The patient will demonstrate knowledge of basic self care strategies and exercises to promote healing   Baseline:  Goal status: goal met 9/12  2.  The patient will report a 30% improvement in pain levels with functional activities which are currently difficult including home and work tasks Baseline: not able to rate today due to changes with her workload Goal status: ongoing  3.  Improved cervical rotation to 60 degrees bil needed for driving Baseline: 60 Goal status: MET   LONG TERM GOALS: Target date: 05/10/2022  The patient will be independent in a safe self progression of a home exercise program to promote further recovery of function   Baseline:  Goal status: INITIAL  2.  The patient will report a 60% improvement in pain levels with functional activities which are currently difficult including work tasks Baseline:  Goal status: INITIAL  3.  Improved cervical sidebending ROM to 52 degrees and rotation to 60 degrees needed for driving and work duties Baseline:  Goal status: INITIAL  4.  Improved cervical, scapular and glenohumeral strength to grossly 4+/5 needed for home and work ADLs Baseline:  Goal status: INITIAL  5.  The patient will have improved FOTO score to  58% indicating improved function with less pain  Baseline:  Goal status: INITIAL   PLAN: PT FREQUENCY: 1-2x/week  PT DURATION: 8 weeks  PLANNED INTERVENTIONS: Therapeutic exercises, Therapeutic activity, Neuromuscular re-education, Patient/Family education, Self Care, Joint mobilization, Aquatic Therapy, Dry Needling, Electrical stimulation, Spinal mobilization, Cryotherapy, Moist heat, Taping, Traction, Ultrasound, Ionotophoresis 35m/ml Dexamethasone, Manual  therapy, and Re-evaluation  PLAN FOR NEXT SESSION:  circuit style strengthening; ERO next session, encourage thoracic and cervical flexibility, DN as needed   KSigurd Sos PT 05/08/22 8:45 AM   [facility] [address] [place]rill [place] [address]Phone # [phone] [phone]

## 2022-05-15 ENCOUNTER — Ambulatory Visit: Payer: Managed Care, Other (non HMO) | Admitting: Physical Therapy

## 2022-05-15 DIAGNOSIS — R252 Cramp and spasm: Secondary | ICD-10-CM

## 2022-05-15 DIAGNOSIS — M542 Cervicalgia: Secondary | ICD-10-CM | POA: Diagnosis not present

## 2022-05-15 DIAGNOSIS — M6281 Muscle weakness (generalized): Secondary | ICD-10-CM

## 2022-05-15 NOTE — Therapy (Signed)
OUTPATIENT PHYSICAL THERAPY CERVICAL PROGRESS NOTE/RECERTIFICATION Patient Name: LOAN OGUIN MRN: [id number] DOB:25-Sep-1980, 41 y.o., female Today's Date: 05/15/2022   PT End of Session - 05/15/22 0928     Visit Number 10    Date for PT Re-Evaluation 07/10/22    Authorization Type [employer]    PT Start Time 8590    PT Stop Time 1014    PT Time Calculation (min) 43 min    Activity Tolerance Patient tolerated treatment well                Past Medical History:  Diagnosis Date   Anxiety    Asthma    Depression    Family history of breast cancer    Frank breech presentation 05/29/2018   Hemorrhoids    Hx of varicella    PONV (postoperative nausea and vomiting)    "little nauseated"   Postpartum care following cesarean delivery (6/14) 01/18/2016   Past Surgical History:  Procedure Laterality Date   CESAREAN SECTION N/A 01/18/2016   Procedure: Primary CESAREAN SECTION;  Surgeon: Princess Bruins, MD;  Location: [place];  Service: Obstetrics;  Laterality: N/A;  EDD: 01/25/16   CESAREAN SECTION N/A 05/29/2018   Procedure: Repeat CESAREAN SECTION;  Surgeon: Azucena Fallen, MD;  Location: [place];  Service: Obstetrics;  Laterality: N/A;  EDD: 06/10/18   CYSTECTOMY  2002   pilonidal region   DIAGNOSTIC LAPAROSCOPY WITH REMOVAL OF ECTOPIC PREGNANCY Right 05/29/2020   Procedure: DIAGNOSTIC LAPAROSCOPY WITH REMOVAL OF ECTOPIC PREGNANCY;  Surgeon: Azucena Fallen, MD;  Location: [place];  Service: Gynecology;  Laterality: Right;   MOLE REMOVAL     UNILATERAL SALPINGECTOMY Right 05/29/2020   Procedure: UNILATERAL SALPINGECTOMY;  Surgeon: Azucena Fallen, MD;  Location: [place];  Service: Gynecology;  Laterality: Right;   WISDOM TOOTH EXTRACTION     Patient Active Problem List   Diagnosis Date Noted   Family history of breast cancer 93/06/2161   Monoallelic mutation of CHEK2 gene in female patient 01/18/2022   Anxiety and depression 05/31/2018   Rh negative,  maternal 05/31/2018   Pilar Plate breech presentation 05/29/2018   Status post repeat low transverse cesarean section 05/29/2018   Postpartum care following cesarean delivery (10/24) 05/29/2018    PCP: Marda Stalker PA  REFERRING PROVIDER: Angelina Pih MD  REFERRING DIAG: M54.2 chronic neck pain   THERAPY DIAG:  Cervicalgia; spasm; weakness  Rationale for Evaluation and Treatment Rehabilitation  ONSET DATE: "Forever" 08/06/21  SUBJECTIVE:  SUBJECTIVE STATEMENT: It's that time of year when the pain is widespread.  Therapy is helping but I need it often.   Pain is variable 30-40% better overall.    PERTINENT HISTORY:  Asthma; fibromyalgia; depression (pt feels this is well controlled); restarted Baclofen  Has a kettlebell at home; bands and dumbbells 2# and 5# and 15# at home  PAIN:  Are you having pain? 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 No  LIVING ENVIRONMENT: Lives with: lives with their family Lives in: House/apartment  OCCUPATION: full time pharmacist ACTIVITY:  [place] riding 1x/week, run in the backyard 2x/week;  agility with the dog  PLOF: Independent  PATIENT GOALS for the pain not make me cry.  I cry when I'm by myself .  OBJECTIVE:   DIAGNOSTIC FINDINGS:  none  PATIENT SURVEYS:  FOTO 42% 10/10: 63%   COGNITION: Overall cognitive status: Within functional limits for tasks  assessed   POSTURE: mild head forward; prominent scalenes and SCM  PALPATION: Cervical joint mobility WNLS;  decreased soft tissue mobility upper traps, suboccipitals; masseters right > left  CERVICAL ROM:   Active ROM A/PROM (deg) eval A/ROM 04/02/22 9/12: 05/08/22 10/10  Flexion 45  42  55  Extension 57  62  45  Right lateral flexion 48  52 50 38  Left lateral flexion 44  48 50 38  Right rotation 50 59 55 60 48  Left rotation 57 60 60 60 56   (Blank rows = not tested)   Decreased thoracic extension and rotation mobility  UPPER EXTREMITY ROM: WFLS , reports right shoulder clicking  UPPER EXTREMITY MMT:   Decreased strength cervical extensors and deep cervical flexors, middle and lower traps grossly 4-/5 10/10:  cervical extensors and deep cervical flexors, middle and lower traps grossly 4/5  CERVICAL SPECIAL TESTS:  Distraction test: Negative    TODAY'S TREATMENT:  10/10: 8# snatch/press overhead 5x right/left 2 8# dead lifts 10x Blue band row and rotate 10x right/left  Manual therapy:soft tissue mobilization bil cervical paraspinal, upper trap, suboccipitals  Trigger Point Dry-Needling  Treatment instructions: Expect mild to moderate muscle soreness. S/S of pneumothorax if dry needled over a lung field, and to seek immediate medical attention should they occur. Patient verbalized understanding of these instructions and education Patient Consent Given: Yes Education handout provided: Previously provided Muscles treated: bil cervical multifidi, Bil upper traps, bil suboccipitals Elongation after needling to neck and upper traps  Treatment response/outcome: improved soft tissue mobility      05/08/22: UBE 2.5 min each forward/backward-PT present to discuss progress 3 way raises: 1# added 2x10 LS stretch 2x 30 sec Bilat  Cable rows 15# x10  Trigger Point Dry-Needling  Treatment instructions: Expect mild to moderate muscle soreness. S/S of pneumothorax if dry needled  over a lung field, and to seek immediate medical attention should they occur. Patient verbalized understanding of these instructions and education Patient Consent Given: Yes Education handout provided: Previously provided Muscles treated: bil cervical multifidi and T1-3, Bil upper traps, bil suboccipitals Elongation after needling to neck and upper traps  Treatment response/outcome: improved soft tissue  mobility   PATIENT EDUCATION:  Education details: anti inflammatory strategies; dry needling after care Person educated: Patient Education method: Explanation Education comprehension: verbalized understanding   HOME EXERCISE PROGRAM:  Access Code: [email or web address] URL: [email or web address] Date: 03/28/2022 Prepared by: Ruben Im  Exercises - Neck Mobilization with Foam Roller  - 1 x daily - 7 x weekly - 1 sets - 10 reps - Supine Static Chest Stretch on Foam Roll  - 1 x daily - 7 x weekly - 3 sets - 10 reps - Overhead Reach on Foam Roll  - 1 x daily - 7 x weekly - 1 sets - 10 reps - Thoracic Mobilization with Hands Behind Head on Foam Roll  - 1 x daily - 7 x weekly - 1 sets - 10 reps - Sidelying Open Book Thoracic Rotation with Knee on Foam Roll  - 1 x daily - 7 x weekly - 1 sets - 10 reps - Supine Deep Neck Flexor Training  - 1 x daily - 7 x weekly - 1 sets - 10 reps - Seated Thoracic Lumbar Extension with Pectoralis Stretch  - 1 x daily - 7 x weekly - 1 sets - 10 reps - Standing Row with Anchored Resistance  - 1 x daily - 7 x weekly - 2 sets - 10 reps - Shoulder extension with resistance - Neutral  - 1 x daily - 7 x weekly - 2 sets - 10 reps - Standing Shoulder Flexion to 90 Degrees with Dumbbells  - 1 x daily - 7 x weekly - 1 sets - 8 reps - Half Deadlift with Kettlebell  - 1 x daily - 7 x weekly - 1 sets - 10 reps - Farmer's Carry with Kettlebells  - 1 x daily - 7 x weekly - 10 reps  Added today 04/02/22: Cervical SELF SNAGS  ASSESSMENT:  CLINICAL IMPRESSION: Pain  is variable but self rates her improvement at 30-40% better overall.  Significant improvement in FOTO outcome score as well as modest improvements in cervical ROM.  She has been able to participate in strengthening of periscapular and spinal muscles with a positive response.  The patient would benefit from a continuation of skilled PT for a further progression of strengthening and functional mobility in addition to interventions to address myofascial trigger points in multiple regions.  Will continue to update and promote independence in a HEP needed for a return to the highest functional level possible with ADLs.      OBJECTIVE IMPAIRMENTS decreased activity tolerance, decreased ROM, decreased strength, increased fascial restrictions, increased muscle spasms, and pain.   ACTIVITY LIMITATIONS carrying, lifting, sitting, standing, and caring for others  PARTICIPATION LIMITATIONS: cleaning, driving, and occupation  PERSONAL FACTORS Past/current experiences, Profession, Time since onset of injury/illness/exacerbation, and 1-2 comorbidities: depression,asthma, fibromyalgia  are also affecting patient's functional outcome.   REHAB POTENTIAL: Good  CLINICAL DECISION MAKING: Evolving/moderate complexity  EVALUATION COMPLEXITY: Moderate   GOALS: Goals reviewed with patient? Yes  SHORT TERM GOALS: Target date: 04/12/2022   The patient will demonstrate knowledge of basic self care strategies and exercises to promote healing   Baseline:  Goal status: goal met 9/12  2.  The patient will report a 30% improvement in pain levels with functional activities which are currently difficult including home and work tasks Baseline: not able to rate today due to changes with her workload Goal status: ongoing  3.  Improved cervical rotation to 60 degrees bil needed for driving Baseline: 60 Goal status: MET  LONG TERM GOALS: Target date: 07/10/2022  The patient will be independent in a safe self  progression of a home exercise program to promote further recovery of function   Baseline:  Goal status: ongoing  2.  The patient will report a 60% improvement in pain levels with functional activities which are currently difficult including work tasks Baseline:  Goal status: ongoing  3.  Improved cervical sidebending ROM to 52 degrees and rotation to 60 degrees needed for driving and work duties Baseline:  Goal status: ongoing  4.  Improved cervical, scapular and glenohumeral strength to grossly 4+/5 needed for home and work ADLs Baseline:  Goal status: ongoing  5.  The patient will have improved FOTO score to  58% indicating improved function with less pain  Baseline:  Goal status: goal met 10/10   PLAN: PT FREQUENCY: 1X/week   PT DURATION: 8 weeks  PLANNED INTERVENTIONS: Therapeutic exercises, Therapeutic activity, Neuromuscular re-education, Patient/Family education, Self Care, Joint mobilization, Aquatic Therapy, Dry Needling, Electrical stimulation, Spinal mobilization, Cryotherapy, Moist heat, Taping, Traction, Ultrasound, Ionotophoresis 65m/ml Dexamethasone, Manual therapy, and Re-evaluation  PLAN FOR NEXT SESSION:  circuit style strengthening, encourage thoracic and cervical flexibility, DN as needed   SRuben Im PT 05/15/22 8:04 PM Phone: [phone]: [phone]  [address] [place] [address]Phone # [phone] [phone]

## 2022-05-22 ENCOUNTER — Ambulatory Visit: Payer: Managed Care, Other (non HMO) | Admitting: Physical Therapy

## 2022-05-22 DIAGNOSIS — M542 Cervicalgia: Secondary | ICD-10-CM

## 2022-05-22 DIAGNOSIS — M6281 Muscle weakness (generalized): Secondary | ICD-10-CM

## 2022-05-22 DIAGNOSIS — R252 Cramp and spasm: Secondary | ICD-10-CM

## 2022-05-22 NOTE — Therapy (Signed)
OUTPATIENT PHYSICAL THERAPY CERVICAL PROGRESS NOTE Patient Name: Adrienne Williams MRN: [id number] DOB:03/07/1981, 41 y.o., female Today's Date: 05/22/2022   PT End of Session - 05/22/22 0849     Visit Number 11    Date for PT Re-Evaluation 07/10/22    Authorization Type [employer]    PT Start Time 0846    PT Stop Time 0917 DN   PT Time Calculation (min) 32 min    Activity Tolerance Patient tolerated treatment well                Past Medical History:  Diagnosis Date   Anxiety    Asthma    Depression    Family history of breast cancer    Frank breech presentation 05/29/2018   Hemorrhoids    Hx of varicella    PONV (postoperative nausea and vomiting)    "little nauseated"   Postpartum care following cesarean delivery (6/14) 01/18/2016   Past Surgical History:  Procedure Laterality Date   CESAREAN SECTION N/A 01/18/2016   Procedure: Primary CESAREAN SECTION;  Surgeon: Princess Bruins, MD;  Location: [facility];  Service: Obstetrics;  Laterality: N/A;  EDD: 01/25/16   CESAREAN SECTION N/A 05/29/2018   Procedure: Repeat CESAREAN SECTION;  Surgeon: Azucena Fallen, MD;  Location: [facility];  Service: Obstetrics;  Laterality: N/A;  EDD: 06/10/18   CYSTECTOMY  2002   pilonidal region   DIAGNOSTIC LAPAROSCOPY WITH REMOVAL OF ECTOPIC PREGNANCY Right 05/29/2020   Procedure: DIAGNOSTIC LAPAROSCOPY WITH REMOVAL OF ECTOPIC PREGNANCY;  Surgeon: Azucena Fallen, MD;  Location: [facility];  Service: Gynecology;  Laterality: Right;   MOLE REMOVAL     UNILATERAL SALPINGECTOMY Right 05/29/2020   Procedure: UNILATERAL SALPINGECTOMY;  Surgeon: Azucena Fallen, MD;  Location: [facility];  Service: Gynecology;  Laterality: Right;   WISDOM TOOTH EXTRACTION     Patient Active Problem List   Diagnosis Date Noted   Family history of breast cancer 85/09/7739   Monoallelic mutation of CHEK2 gene in female patient 01/18/2022   Anxiety and depression 05/31/2018   Rh negative, maternal  05/31/2018   Pilar Plate breech presentation 05/29/2018   Status post repeat low transverse cesarean section 05/29/2018   Postpartum care following cesarean delivery (10/24) 05/29/2018    PCP: Marda Stalker PA  REFERRING PROVIDER: Angelina Pih MD  REFERRING DIAG: M54.2 chronic neck pain   THERAPY DIAG:  Cervicalgia; spasm; weakness  Rationale for Evaluation and Treatment Rehabilitation  ONSET DATE: "Forever" 08/06/21  SUBJECTIVE:  SUBJECTIVE STATEMENT: I had a massage last time but I'm still tight.  The DN helped last time.  I don't remember how long it lasted.   PERTINENT HISTORY:  Asthma; fibromyalgia; depression (pt feels this is well controlled); restarted Baclofen  Has a kettlebell at home; bands and dumbbells 2# and 5# and 15# at home  PAIN:  Are you having pain? 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 No  LIVING ENVIRONMENT: Lives with: lives with their family Lives in: House/apartment  OCCUPATION: full time pharmacist ACTIVITY:  [place] riding 1x/week, run in the backyard 2x/week;  agility with the dog  PLOF: Independent  PATIENT GOALS for the pain not make me cry.  I cry when I'm by myself .  OBJECTIVE:   DIAGNOSTIC FINDINGS:  none  PATIENT SURVEYS:  FOTO 42% 10/10: 63%   COGNITION: Overall cognitive status: Within functional limits for tasks assessed   POSTURE: mild head forward; prominent scalenes and  SCM  PALPATION: Cervical joint mobility WNLS;  decreased soft tissue mobility upper traps, suboccipitals; masseters right > left  CERVICAL ROM:   Active ROM A/PROM (deg) eval A/ROM 04/02/22 9/12: 05/08/22 10/10  Flexion 45  42  55  Extension 57  62  45  Right lateral flexion 48  52 50 38  Left lateral flexion 44  48 50 38  Right rotation 50 59 55 60 48  Left rotation 57 60 60 60 56   (Blank rows = not tested)   Decreased thoracic extension and rotation mobility  UPPER EXTREMITY ROM: WFLS , reports right shoulder clicking  UPPER EXTREMITY MMT:   Decreased strength cervical extensors and deep cervical flexors, middle and lower traps grossly 4-/5 10/10:  cervical extensors and deep cervical flexors, middle and lower traps grossly 4/5  CERVICAL SPECIAL TESTS:  Distraction test: Negative    TODAY'S TREATMENT:  10/17: UBE 4 min L1 forward/back while discussing status Red band face pulls 10x  Cable Triceps 20# 10x 5# kettlebell rotate body/press overhead 5x 5# kettlebell carries at 90 degrees 2x each side  25# kettlebell deadlifts 10x Mountain climbers on chair 10x Manual therapy:soft tissue mobilization bil cervical paraspinal, upper trap, suboccipitals  Trigger Point Dry-Needling  Treatment instructions: Expect mild to moderate muscle soreness. S/S of pneumothorax if dry needled over a lung field, and to seek immediate medical attention should they occur. Patient verbalized understanding of these instructions and education Patient Consent Given: Yes Education handout provided: Previously provided Muscles treated: bil cervical multifidi, Bil upper traps, bil suboccipitals Elongation after needling to neck and upper traps  Treatment response/outcome: improved soft tissue mobility     10/10: 8# snatch/press overhead 5x right/left 2 8# dead lifts 10x Blue band row and rotate 10x right/left  Manual therapy:soft tissue mobilization bil cervical paraspinal, upper trap,  suboccipitals  Trigger Point Dry-Needling  Treatment instructions: Expect mild to moderate muscle soreness. S/S of pneumothorax if dry needled over a lung field, and to seek immediate medical attention should they occur. Patient verbalized understanding of these instructions and education Patient Consent Given: Yes Education handout provided: Previously provided Muscles treated: bil cervical multifidi, Bil upper traps, bil suboccipitals Elongation after needling to neck  and upper traps  Treatment response/outcome: improved soft tissue mobility    PATIENT EDUCATION:  Education details: anti inflammatory strategies; dry needling after care Person educated: Patient Education method: Explanation Education comprehension: verbalized understanding   HOME EXERCISE PROGRAM:  Access Code: [email or web address] URL: [email or web address] Date: 03/28/2022 Prepared by: Ruben Im  Exercises - Neck Mobilization with Foam Roller  - 1 x daily - 7 x weekly - 1 sets - 10 reps - Supine Static Chest Stretch on Foam Roll  - 1 x daily - 7 x weekly - 3 sets - 10 reps - Overhead Reach on Foam Roll  - 1 x daily - 7 x weekly - 1 sets - 10 reps - Thoracic Mobilization with Hands Behind Head on Foam Roll  - 1 x daily - 7 x weekly - 1 sets - 10 reps - Sidelying Open Book Thoracic Rotation with Knee on Foam Roll  - 1 x daily - 7 x weekly - 1 sets - 10 reps - Supine Deep Neck Flexor Training  - 1 x daily - 7 x weekly - 1 sets - 10 reps - Seated Thoracic Lumbar Extension with Pectoralis Stretch  - 1 x daily - 7 x weekly - 1 sets - 10 reps - Standing Row with Anchored Resistance  - 1 x daily - 7 x weekly - 2 sets - 10 reps - Shoulder extension with resistance - Neutral  - 1 x daily - 7 x weekly - 2 sets - 10 reps - Standing Shoulder Flexion to 90 Degrees with Dumbbells  - 1 x daily - 7 x weekly - 1 sets - 8 reps - Half Deadlift with Kettlebell  - 1 x daily - 7 x weekly - 1 sets - 10 reps - Farmer's Carry with  Kettlebells  - 1 x daily - 7 x weekly - 10 reps  Added today 04/02/22: Cervical SELF SNAGS  ASSESSMENT:  CLINICAL IMPRESSION: Able to perform heavier load strength training with some improvement in tightness/pain.  The patient benefits significantly from dry needling and manual therapy to stimulate underlying myofascial trigger points and muscular tissue for management of neuromusculoskeletal pain and address movement impairments although limited in tolerance secondary to sensitivity.   Improved soft tissue mobility and decreased tender point size and number following treatment session.     OBJECTIVE IMPAIRMENTS decreased activity tolerance, decreased ROM, decreased strength, increased fascial restrictions, increased muscle spasms, and pain.   ACTIVITY LIMITATIONS carrying, lifting, sitting, standing, and caring for others  PARTICIPATION LIMITATIONS: cleaning, driving, and occupation  PERSONAL FACTORS Past/current experiences, Profession, Time since onset of injury/illness/exacerbation, and 1-2 comorbidities: depression,asthma, fibromyalgia  are also affecting patient's functional outcome.   REHAB POTENTIAL: Good  CLINICAL DECISION MAKING: Evolving/moderate complexity  EVALUATION COMPLEXITY: Moderate   GOALS: Goals reviewed with patient? Yes  SHORT TERM GOALS: Target date: 04/12/2022   The patient will demonstrate knowledge of basic self care strategies and exercises to promote healing   Baseline:  Goal status: goal met 9/12  2.  The patient will report a 30% improvement in pain levels with functional activities which are currently difficult including home and work tasks Baseline: not able to rate today due to changes with her workload Goal status: ongoing  3.  Improved cervical rotation to 60 degrees bil needed for driving Baseline: 60 Goal status: MET   LONG TERM GOALS: Target date: 07/10/2022  The patient will be independent in a safe self progression of a home exercise  program to promote further  recovery of function   Baseline:  Goal status: ongoing  2.  The patient will report a 60% improvement in pain levels with functional activities which are currently difficult including work tasks Baseline:  Goal status: ongoing  3.  Improved cervical sidebending ROM to 52 degrees and rotation to 60 degrees needed for driving and work duties Baseline:  Goal status: ongoing  4.  Improved cervical, scapular and glenohumeral strength to grossly 4+/5 needed for home and work ADLs Baseline:  Goal status: ongoing  5.  The patient will have improved FOTO score to  58% indicating improved function with less pain  Baseline:  Goal status: goal met 10/10   PLAN: PT FREQUENCY: 1X/week   PT DURATION: 8 weeks  PLANNED INTERVENTIONS: Therapeutic exercises, Therapeutic activity, Neuromuscular re-education, Patient/Family education, Self Care, Joint mobilization, Aquatic Therapy, Dry Needling, Electrical stimulation, Spinal mobilization, Cryotherapy, Moist heat, Taping, Traction, Ultrasound, Ionotophoresis 11m/ml Dexamethasone, Manual therapy, and Re-evaluation  PLAN FOR NEXT SESSION:  mountain climbers on floor, face pulls with green band; circuit style strengthening, encourage thoracic and cervical flexibility, DN as needed   SRuben Im PT 05/22/22 9:18 AM Phone: [phone]: [phone]  [place] [place] [place] [address]Phone # [phone] [phone]

## 2022-05-29 ENCOUNTER — Ambulatory Visit: Payer: Managed Care, Other (non HMO) | Admitting: Physical Therapy

## 2022-05-29 DIAGNOSIS — M542 Cervicalgia: Secondary | ICD-10-CM | POA: Diagnosis not present

## 2022-05-29 DIAGNOSIS — M6281 Muscle weakness (generalized): Secondary | ICD-10-CM

## 2022-05-29 DIAGNOSIS — R252 Cramp and spasm: Secondary | ICD-10-CM

## 2022-05-29 NOTE — Therapy (Signed)
OUTPATIENT PHYSICAL THERAPY CERVICAL PROGRESS NOTE Patient Name: Adrienne Williams MRN: [id number] DOB:10-15-1980, 41 y.o., female Today's Date: 05/29/2022  PT End of Session - 05/29/22 0859     Visit Number 12    Date for PT Re-Evaluation 07/10/22    Authorization Type [employer]    PT Start Time 0847    PT Stop Time 0927   PT Time Calculation (min) 40 min    Activity Tolerance Patient tolerated treatment well                     Past Medical History:  Diagnosis Date   Anxiety    Asthma    Depression    Family history of breast cancer    Frank breech presentation 05/29/2018   Hemorrhoids    Hx of varicella    PONV (postoperative nausea and vomiting)    "little nauseated"   Postpartum care following cesarean delivery (6/14) 01/18/2016   Past Surgical History:  Procedure Laterality Date   CESAREAN SECTION N/A 01/18/2016   Procedure: Primary CESAREAN SECTION;  Surgeon: Princess Bruins, MD;  Location: [facility];  Service: Obstetrics;  Laterality: N/A;  EDD: 01/25/16   CESAREAN SECTION N/A 05/29/2018   Procedure: Repeat CESAREAN SECTION;  Surgeon: Azucena Fallen, MD;  Location: [facility];  Service: Obstetrics;  Laterality: N/A;  EDD: 06/10/18   CYSTECTOMY  2002   pilonidal region   DIAGNOSTIC LAPAROSCOPY WITH REMOVAL OF ECTOPIC PREGNANCY Right 05/29/2020   Procedure: DIAGNOSTIC LAPAROSCOPY WITH REMOVAL OF ECTOPIC PREGNANCY;  Surgeon: Azucena Fallen, MD;  Location: [facility];  Service: Gynecology;  Laterality: Right;   MOLE REMOVAL     UNILATERAL SALPINGECTOMY Right 05/29/2020   Procedure: UNILATERAL SALPINGECTOMY;  Surgeon: Azucena Fallen, MD;  Location: [facility];  Service: Gynecology;  Laterality: Right;   WISDOM TOOTH EXTRACTION     Patient Active Problem List   Diagnosis Date Noted   Family history of breast cancer 38/88/2800   Monoallelic mutation of CHEK2 gene in female patient 01/18/2022   Anxiety and depression 05/31/2018   Rh negative, maternal  05/31/2018   Pilar Plate breech presentation 05/29/2018   Status post repeat low transverse cesarean section 05/29/2018   Postpartum care following cesarean delivery (10/24) 05/29/2018    PCP: Marda Stalker PA  REFERRING PROVIDER: Angelina Pih MD  REFERRING DIAG: M54.2 chronic neck pain   THERAPY DIAG:  Cervicalgia; spasm; weakness  Rationale for Evaluation and Treatment Rehabilitation  ONSET DATE: "Forever" 08/06/21  SUBJECTIVE:  SUBJECTIVE STATEMENT: I ran for 1 hour on Friday and felt better.  Pain is widespread anterior neck/shoulder and mid back too.    PERTINENT HISTORY:  Asthma; fibromyalgia; depression (pt feels this is well controlled); restarted Baclofen  Has a kettlebell at home; bands and dumbbells 2# and 5# and 15# at home  PAIN:  Are you having pain? 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 No  LIVING ENVIRONMENT: Lives with: lives with their family Lives in: House/apartment  OCCUPATION: full time pharmacist ACTIVITY:  [place] riding 1x/week, run in the backyard 2x/week;  agility with the dog  PLOF: Independent  PATIENT GOALS for the pain not make me cry.  I cry when I'm by myself .  OBJECTIVE:   DIAGNOSTIC FINDINGS:  none  PATIENT SURVEYS:  FOTO 42% 10/10: 63%   COGNITION: Overall cognitive status: Within functional limits for tasks assessed   POSTURE: mild head forward; prominent scalenes and SCM  PALPATION: Cervical  joint mobility WNLS;  decreased soft tissue mobility upper traps, suboccipitals; masseters right > left  CERVICAL ROM:   Active ROM A/PROM (deg) eval A/ROM 04/02/22 9/12: 05/08/22 10/10 10/24  Flexion 45  42  55   Extension 57  62  45   Right lateral flexion 48  52 50 38 40  Left lateral flexion 44  48 50 38 40  Right rotation 50 59 55 60 48   Left rotation 57 60 60 60 56    (Blank rows = not tested)   Decreased thoracic extension and rotation mobility  UPPER EXTREMITY ROM: WFLS , reports right shoulder clicking  UPPER EXTREMITY MMT:   Decreased strength cervical extensors and deep cervical flexors, middle and lower traps grossly 4-/5 10/10:  cervical extensors and deep cervical flexors, middle and lower traps grossly 4/5  CERVICAL SPECIAL TESTS:  Distraction test: Negative    TODAY'S TREATMENT:  10/24: UBE 5 min L1 forward/back while discussing status Doorway stretch 5x right/left Wall pec stretch with head turn 5x each side  1/2 kneel open books 10x right/left 1/2 kneel blue band scap retraction 10x 1/2 kneel 5# overhead punch 10x right/left  Tall kneel blue band horizontal rows 10x Manual therapy:soft tissue mobilization bil cervical paraspinal, upper trap Trigger Point Dry-Needling  Treatment instructions: Expect mild to moderate muscle soreness. S/S of pneumothorax if dry needled over a lung field, and to seek immediate medical attention should they occur. Patient verbalized understanding of these instructions and education Patient Consent Given: Yes Education handout provided: Previously provided Muscles treated: bil cervical multifidi, Bil upper traps Elongation after needling to neck and upper traps  Treatment response/outcome: improved soft tissue mobility         10/17: UBE 4 min L1 forward/back while discussing status Red band face pulls 10x  Cable Triceps 20# 10x 5# kettlebell rotate body/press overhead 5x 5# kettlebell carries at 90 degrees 2x each  side  25# kettlebell deadlifts 10x Mountain climbers on chair 10x Manual therapy:soft tissue mobilization bil cervical paraspinal, upper trap, suboccipitals  Trigger Point Dry-Needling  Treatment instructions: Expect mild to moderate muscle soreness. S/S of pneumothorax if dry needled over a lung field, and to seek immediate medical  attention should they occur. Patient verbalized understanding of these instructions and education Patient Consent Given: Yes Education handout provided: Previously provided Muscles treated: bil cervical multifidi, Bil upper traps, bil suboccipitals Elongation after needling to neck and upper traps  Treatment response/outcome: improved soft tissue mobility     PATIENT EDUCATION:  Education details: anti inflammatory strategies; dry needling after care Person educated: Patient Education method: Explanation Education comprehension: verbalized understanding   HOME EXERCISE PROGRAM:  Access Code: [email or web address] URL: [email or web address] Date: 03/28/2022 Prepared by: Ruben Im  Exercises - Neck Mobilization with Foam Roller  - 1 x daily - 7 x weekly - 1 sets - 10 reps - Supine Static Chest Stretch on Foam Roll  - 1 x daily - 7 x weekly - 3 sets - 10 reps - Overhead Reach on Foam Roll  - 1 x daily - 7 x weekly - 1 sets - 10 reps - Thoracic Mobilization with Hands Behind Head on Foam Roll  - 1 x daily - 7 x weekly - 1 sets - 10 reps - Sidelying Open Book Thoracic Rotation with Knee on Foam Roll  - 1 x daily - 7 x weekly - 1 sets - 10 reps - Supine Deep Neck Flexor Training  - 1 x daily - 7 x weekly - 1 sets - 10 reps - Seated Thoracic Lumbar Extension with Pectoralis Stretch  - 1 x daily - 7 x weekly - 1 sets - 10 reps - Standing Row with Anchored Resistance  - 1 x daily - 7 x weekly - 2 sets - 10 reps - Shoulder extension with resistance - Neutral  - 1 x daily - 7 x weekly - 2 sets - 10 reps - Standing Shoulder Flexion to 90 Degrees with  Dumbbells  - 1 x daily - 7 x weekly - 1 sets - 8 reps - Half Deadlift with Kettlebell  - 1 x daily - 7 x weekly - 1 sets - 10 reps - Farmer's Carry with Kettlebells  - 1 x daily - 7 x weekly - 10 reps  Added today 04/02/22: Cervical SELF SNAGS  ASSESSMENT:  CLINICAL IMPRESSION: Improving with challenge level of upper quarter strengthening and dynamic stretching.  Progressed positioning to kneeling to further activate core and scapular stabilizers.  Patient responds well to DN although limited amounts performed secondary to sensitivity.  Verbal and tactile cues to optimize activation of middle and lower traps.      OBJECTIVE IMPAIRMENTS decreased activity tolerance, decreased ROM, decreased strength, increased fascial restrictions, increased muscle spasms, and pain.   ACTIVITY LIMITATIONS carrying, lifting, sitting, standing, and caring for others  PARTICIPATION LIMITATIONS: cleaning, driving, and occupation  PERSONAL FACTORS Past/current experiences, Profession, Time since onset of injury/illness/exacerbation, and 1-2 comorbidities: depression,asthma, fibromyalgia  are also affecting patient's functional outcome.   REHAB POTENTIAL: Good  CLINICAL DECISION MAKING: Evolving/moderate complexity  EVALUATION COMPLEXITY: Moderate   GOALS: Goals reviewed with patient? Yes  SHORT TERM GOALS: Target date: 04/12/2022   The patient will demonstrate knowledge of basic self care strategies and exercises to promote healing   Baseline:  Goal status: goal met 9/12  2.  The patient will report a 30% improvement in pain levels with functional activities which are currently difficult including home and work tasks Baseline: not able to rate today due to changes with her workload Goal status: ongoing  3.  Improved cervical rotation to 60 degrees bil needed for driving Baseline: 60 Goal status: MET   LONG TERM GOALS:  Target date: 07/10/2022  The patient will be independent in a safe self  progression of a home exercise program to promote further recovery of function   Baseline:  Goal status: ongoing  2.  The patient will report a 60% improvement in pain levels with functional activities which are currently difficult including work tasks Baseline:  Goal status: ongoing  3.  Improved cervical sidebending ROM to 52 degrees and rotation to 60 degrees needed for driving and work duties Baseline:  Goal status: ongoing  4.  Improved cervical, scapular and glenohumeral strength to grossly 4+/5 needed for home and work ADLs Baseline:  Goal status: ongoing  5.  The patient will have improved FOTO score to  58% indicating improved function with less pain  Baseline:  Goal status: goal met 10/10   PLAN: PT FREQUENCY: 1X/week   PT DURATION: 8 weeks  PLANNED INTERVENTIONS: Therapeutic exercises, Therapeutic activity, Neuromuscular re-education, Patient/Family education, Self Care, Joint mobilization, Aquatic Therapy, Dry Needling, Electrical stimulation, Spinal mobilization, Cryotherapy, Moist heat, Taping, Traction, Ultrasound, Ionotophoresis 4m/ml Dexamethasone, Manual therapy, and Re-evaluation  PLAN FOR NEXT SESSION: 1/2 kneeling; pectoral stretches with head turns; mountain climbers on floor, face pulls with green band; circuit style strengthening, encourage thoracic and cervical flexibility, DN as needed   SRuben Im PT 05/29/22 4:16 PM Phone: [phone]: [phone]  [place] [place] [place] [address]Phone # [phone] [phone]

## 2022-05-30 ENCOUNTER — Ambulatory Visit: Payer: Managed Care, Other (non HMO) | Admitting: Physical Therapy

## 2022-06-01 ENCOUNTER — Telehealth: Payer: Self-pay | Admitting: Gastroenterology

## 2022-06-01 NOTE — Telephone Encounter (Signed)
Pt has bleeding hem. Recommended she try Prep H supp otc, sitz bathes, tucs pads, recticare for pain. She stattes it will not stop bleeding. Discussed with her if the bleeding gets heavy and will not stop she should go to ER or urgent care. Pt aware.

## 2022-06-01 NOTE — Telephone Encounter (Signed)
Patient called states her hemorrhoid is bleeding. Wondering if there is something she can do to help. Requesting a call

## 2022-06-05 ENCOUNTER — Ambulatory Visit: Payer: Managed Care, Other (non HMO) | Admitting: Physical Therapy

## 2022-06-05 ENCOUNTER — Encounter: Payer: Managed Care, Other (non HMO) | Admitting: Physical Therapy

## 2022-06-05 DIAGNOSIS — R252 Cramp and spasm: Secondary | ICD-10-CM

## 2022-06-05 DIAGNOSIS — M542 Cervicalgia: Secondary | ICD-10-CM | POA: Diagnosis not present

## 2022-06-05 DIAGNOSIS — M6281 Muscle weakness (generalized): Secondary | ICD-10-CM

## 2022-06-05 NOTE — Therapy (Signed)
OUTPATIENT PHYSICAL THERAPY CERVICAL PROGRESS NOTE Patient Name: Adrienne Williams MRN: [id number] DOB:April 22, 1981, 41 y.o., female       PT End of Session - 06/05/22 0757     Visit Number 13    Date for PT Re-Evaluation 07/10/22    Authorization Type [employer]    PT Start Time 63    PT Stop Time 0836   PT Time Calculation (min) 38 min    Activity Tolerance Patient tolerated treatment well               Past Medical History:  Diagnosis Date   Anxiety    Asthma    Depression    Family history of breast cancer    Frank breech presentation 05/29/2018   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   Hemorrhoids    Hx of varicella    PONV (postoperative nausea and vomiting)    "little nauseated"   Postpartum care following cesarean delivery (6/14) 01/18/2016   Past Surgical History:  Procedure Laterality Date   CESAREAN SECTION N/A 01/18/2016   Procedure: Primary CESAREAN SECTION;  Surgeon: Princess Bruins, MD;  Location: [place];  Service: Obstetrics;  Laterality: N/A;  EDD: 01/25/16   CESAREAN SECTION N/A 05/29/2018   Procedure: Repeat CESAREAN SECTION;  Surgeon: Azucena Fallen, MD;  Location: [place];  Service: Obstetrics;  Laterality: N/A;  EDD: 06/10/18   COLONOSCOPY     CYSTECTOMY  2002   pilonidal region   DIAGNOSTIC LAPAROSCOPY WITH REMOVAL OF ECTOPIC PREGNANCY Right 05/29/2020   Procedure: DIAGNOSTIC LAPAROSCOPY WITH REMOVAL OF ECTOPIC PREGNANCY;  Surgeon: Azucena Fallen, MD;  Location: [place];  Service: Gynecology;  Laterality: Right;   MOLE REMOVAL     UNILATERAL SALPINGECTOMY Right 05/29/2020   Procedure: UNILATERAL SALPINGECTOMY;  Surgeon: Azucena Fallen, MD;  Location: [place];  Service: Gynecology;  Laterality: Right;   WISDOM TOOTH EXTRACTION     Patient Active Problem List   Diagnosis Date Noted   Family history of breast cancer 51/83/3582   Monoallelic mutation of CHEK2 gene in female patient 01/18/2022   Anxiety and  depression 05/31/2018   Rh negative, maternal 05/31/2018   Pilar Plate breech presentation 05/29/2018   Status post repeat low transverse cesarean section 05/29/2018   Postpartum care following cesarean delivery (10/24) 05/29/2018    PCP: Marda Stalker PA  REFERRING PROVIDER: Angelina Pih MD  REFERRING DIAG: M54.2 chronic neck pain   THERAPY DIAG:  Cervicalgia; spasm; weakness  Rationale for Evaluation and Treatment Rehabilitation  ONSET DATE: "Forever" 08/06/21  SUBJECTIVE:  SUBJECTIVE STATEMENT: Not as bad as I expected considering the rain.  Still kind of tight.  I like doing the heavier weights.    PERTINENT HISTORY:  Asthma; fibromyalgia; depression (pt feels this is well controlled); restarted Baclofen  Has a kettlebell at home; bands and dumbbells 2# and 5# and 15# at home  PAIN:  Are you having pain? 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 No  LIVING ENVIRONMENT: Lives with: lives with their family Lives in: House/apartment  OCCUPATION: full time pharmacist ACTIVITY:  [place] riding 1x/week, run in the backyard 2x/week;  agility with the dog  PLOF: Independent  PATIENT GOALS for the pain not make me cry.  I cry when I'm by myself .  OBJECTIVE:   DIAGNOSTIC FINDINGS:  none  PATIENT SURVEYS:  FOTO 42% 10/10: 63%   COGNITION: Overall cognitive status: Within functional limits for tasks assessed   POSTURE: mild head forward;  prominent scalenes and SCM  PALPATION: Cervical joint mobility WNLS;  decreased soft tissue mobility upper traps, suboccipitals; masseters right > left  CERVICAL ROM:   Active ROM A/PROM (deg) eval A/ROM 04/02/22 9/12: 05/08/22 _0  Flexion 45  42  55    Extension 57  62  45    Right lateral flexion 48  52 50 38 40 45  Left lateral flexion 44  48 50 38 40 45  Right rotation 50 59 55 60 48  55  Left rotation 57 60 60 60 56  55   (Blank rows = not tested)   Decreased thoracic extension and rotation mobility  UPPER EXTREMITY ROM: WFLS , reports right shoulder clicking  UPPER EXTREMITY MMT:   Decreased strength cervical extensors and deep cervical flexors, middle and lower traps grossly 4-/5 10/10:  cervical extensors and deep cervical flexors, middle and lower traps grossly 4/5  CERVICAL SPECIAL TESTS:  Distraction test: Negative    TODAY'S TREATMENT:  06/06/23 UBE 5 min L1 forward/back while discussing status 1/2 kneel on BOSU weight pass around weight 8x each direction 1/2 kneel on BOSU  press overhead 10x each side 30# dead lifts to knee level 10x Blue band lat pulls: regular stance, staggered stance to each side 8x each way Plank on edge of mat finger to knee touch 10x Tall kneel blue band horizontal rows 10x Manual therapy:soft tissue mobilization bil cervical paraspinal, upper trap, bil suboccipital Trigger Point Dry-Needling  Treatment instructions: Expect mild to moderate muscle soreness. S/S of pneumothorax if dry needled over a lung field, and to seek immediate medical attention should they occur. Patient verbalized understanding of these instructions and education Patient Consent Given: Yes Education handout provided: Previously provided Muscles treated: bil cervical multifidi, Bil upper traps, bil suboccipitals Elongation after needling to neck and upper traps  Treatment response/outcome: improved soft tissue mobility          10/24: UBE 5  min L1 forward/back while discussing status Doorway stretch 5x right/left Wall pec stretch with head turn 5x each side  1/2 kneel open books 10x right/left 1/2 kneel blue band scap retraction 10x 1/2 kneel 5# overhead punch 10x right/left  Tall kneel blue band horizontal rows 10x Manual  therapy:soft tissue mobilization bil cervical paraspinal, upper trap Trigger Point Dry-Needling  Treatment instructions: Expect mild to moderate muscle soreness. S/S of pneumothorax if dry needled over a lung field, and to seek immediate medical attention should they occur. Patient verbalized understanding of these instructions and education Patient Consent Given: Yes Education handout provided: Previously provided Muscles treated: bil cervical multifidi, Bil upper traps Elongation after needling to neck and upper traps  Treatment response/outcome: improved soft tissue mobility     PATIENT EDUCATION:  Education details: anti inflammatory strategies; dry needling after care Person educated: Patient Education method: Explanation Education comprehension: verbalized understanding   HOME EXERCISE PROGRAM:  Access Code: [email or web address] URL: [email or web address] Date: 03/28/2022 Prepared by: Ruben Im  Exercises - Neck Mobilization with Foam Roller  - 1 x daily - 7 x weekly - 1 sets - 10 reps - Supine Static Chest Stretch on Foam Roll  - 1 x daily - 7 x weekly - 3 sets - 10 reps - Overhead Reach on Foam Roll  - 1 x daily - 7 x weekly - 1 sets - 10 reps - Thoracic Mobilization with Hands Behind Head on Foam Roll  - 1 x daily - 7 x weekly - 1 sets - 10 reps - Sidelying Open Book Thoracic Rotation with Knee on Foam Roll  - 1 x daily - 7 x weekly - 1 sets - 10 reps - Supine Deep Neck Flexor Training  - 1 x daily - 7 x weekly - 1 sets - 10 reps - Seated Thoracic Lumbar Extension with Pectoralis Stretch  - 1 x daily - 7 x weekly - 1 sets - 10 reps - Standing Row with Anchored Resistance  - 1 x  daily - 7 x weekly - 2 sets - 10 reps - Shoulder extension with resistance - Neutral  - 1 x daily - 7 x weekly - 2 sets - 10 reps - Standing Shoulder Flexion to 90 Degrees with Dumbbells  - 1 x daily - 7 x weekly - 1 sets - 8 reps - Half Deadlift with Kettlebell  - 1 x daily - 7 x weekly - 1 sets - 10 reps - Farmer's Carry with Kettlebells  - 1 x daily - 7 x weekly - 10 reps  Added today 04/02/22: Cervical SELF SNAGS  ASSESSMENT:  CLINICAL IMPRESSION: Good response with a progressive strength training program including higher loads and body weight exs.  Verbal cues for increased hip hinge with dead lifts.  The patient had numerous muscle twitches produced during dry needling which is a good prognostic indicator for benefit.    OBJECTIVE IMPAIRMENTS decreased activity tolerance, decreased ROM, decreased strength, increased fascial restrictions, increased muscle spasms, and pain.   ACTIVITY LIMITATIONS carrying, lifting, sitting, standing, and caring for others  PARTICIPATION LIMITATIONS: cleaning, driving, and occupation  PERSONAL FACTORS Past/current experiences, Profession, Time since onset of injury/illness/exacerbation, and 1-2 comorbidities: depression,asthma, fibromyalgia  are also affecting patient's functional outcome.   REHAB POTENTIAL: Good  CLINICAL DECISION MAKING: Evolving/moderate complexity  EVALUATION COMPLEXITY: Moderate   GOALS: Goals reviewed with patient? Yes  SHORT TERM GOALS: Target date: 04/12/2022   The patient will demonstrate knowledge of basic self care strategies and exercises to promote healing   Baseline:  Goal status: goal met 9/12  2.  The patient will report a 30% improvement in pain levels with functional activities which are currently difficult including home and work tasks Baseline: not able to rate today due to changes with her workload  Goal status: ongoing  3.  Improved cervical rotation to 60 degrees bil needed for driving Baseline:  60 Goal status: MET   LONG TERM GOALS: Target date: 07/10/2022  The patient will be independent in a safe self progression of a home exercise program to promote further recovery of function   Baseline:  Goal status: ongoing  2.  The patient will report a 60% improvement in pain levels with functional activities which are currently difficult including work tasks Baseline:  Goal status: ongoing  3.  Improved cervical sidebending ROM to 52 degrees and rotation to 60 degrees needed for driving and work duties Baseline:  Goal status: ongoing  4.  Improved cervical, scapular and glenohumeral strength to grossly 4+/5 needed for home and work ADLs Baseline:  Goal status: ongoing  5.  The patient will have improved FOTO score to  58% indicating improved function with less pain  Baseline:  Goal status: goal met 10/10   PLAN: PT FREQUENCY: 1X/week   PT DURATION: 8 weeks  PLANNED INTERVENTIONS: Therapeutic exercises, Therapeutic activity, Neuromuscular re-education, Patient/Family education, Self Care, Joint mobilization, Aquatic Therapy, Dry Needling, Electrical stimulation, Spinal mobilization, Cryotherapy, Moist heat, Taping, Traction, Ultrasound, Ionotophoresis 59m/ml Dexamethasone, Manual therapy, and Re-evaluation  PLAN FOR NEXT SESSION: 1/2 kneeling; pectoral stretches with head turns; mountain climbers on floor, face pulls with green band;  progressive upper quarter strengthening, encourage thoracic and cervical flexibility, DN as needed   SRuben Im PT 06/05/22 8:36 AM Phone: [phone]: [phone] [place] [address]Phone # [phone] [phone]

## 2022-06-12 ENCOUNTER — Ambulatory Visit: Payer: Managed Care, Other (non HMO) | Attending: Family Medicine | Admitting: Physical Therapy

## 2022-06-12 DIAGNOSIS — R252 Cramp and spasm: Secondary | ICD-10-CM | POA: Diagnosis present

## 2022-06-12 DIAGNOSIS — M6281 Muscle weakness (generalized): Secondary | ICD-10-CM | POA: Insufficient documentation

## 2022-06-12 DIAGNOSIS — M542 Cervicalgia: Secondary | ICD-10-CM | POA: Insufficient documentation

## 2022-06-12 NOTE — Therapy (Signed)
OUTPATIENT PHYSICAL THERAPY CERVICAL PROGRESS NOTE Patient Name: Adrienne Williams MRN: [id number] DOB:[birth year], 41 y.o., female       PT End of Session - 06/05/22 0757     Visit Number 13    Date for PT Re-Evaluation 07/10/22    Authorization Type [employer]    PT Start Time 70    PT Stop Time 0836   PT Time Calculation (min) 38 min    Activity Tolerance Patient tolerated treatment well               Past Medical History:  Diagnosis Date   Anxiety    Asthma    Depression    Family history of breast cancer    Frank breech presentation 05/29/2018   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   Hemorrhoids    Hx of varicella    PONV (postoperative nausea and vomiting)    "little nauseated"   Postpartum care following cesarean delivery (6/14) 01/18/2016   Past Surgical History:  Procedure Laterality Date   CESAREAN SECTION N/A 01/18/2016   Procedure: Primary CESAREAN SECTION;  Surgeon: Princess Bruins, MD;  Location: [facility];  Service: Obstetrics;  Laterality: N/A;  EDD: 01/25/16   CESAREAN SECTION N/A 05/29/2018   Procedure: Repeat CESAREAN SECTION;  Surgeon: Azucena Fallen, MD;  Location: [place];  Service: Obstetrics;  Laterality: N/A;  EDD: 06/10/18   COLONOSCOPY     CYSTECTOMY  2002   pilonidal region   DIAGNOSTIC LAPAROSCOPY WITH REMOVAL OF ECTOPIC PREGNANCY Right 05/29/2020   Procedure: DIAGNOSTIC LAPAROSCOPY WITH REMOVAL OF ECTOPIC PREGNANCY;  Surgeon: Azucena Fallen, MD;  Location: [place];  Service: Gynecology;  Laterality: Right;   MOLE REMOVAL     UNILATERAL SALPINGECTOMY Right 05/29/2020   Procedure: UNILATERAL SALPINGECTOMY;  Surgeon: Azucena Fallen, MD;  Location: [place];  Service: Gynecology;  Laterality: Right;   WISDOM TOOTH EXTRACTION     Patient Active Problem List   Diagnosis Date Noted   Family history of breast cancer 41/66/0630   Monoallelic mutation of CHEK2 gene in female patient 01/18/2022   Anxiety and  depression 05/31/2018   Rh negative, maternal 05/31/2018   Pilar Plate breech presentation 05/29/2018   Status post repeat low transverse cesarean section 05/29/2018   Postpartum care following cesarean delivery (10/24) 05/29/2018    PCP: Marda Stalker PA  REFERRING PROVIDER: Angelina Pih MD  REFERRING DIAG: M54.2 chronic neck pain   THERAPY DIAG:  Cervicalgia; spasm; weakness  Rationale for Evaluation and Treatment Rehabilitation  ONSET DATE: "Forever" 08/06/21  SUBJECTIVE:  SUBJECTIVE STATEMENT: Doing OK today.  Got kind of bad yesterday, triggered by driving.   I found something to help with the pain, I ran for 75 minutes and I felt better.    PERTINENT HISTORY:  Asthma; fibromyalgia; depression (pt feels this is well controlled); restarted Baclofen  Has a kettlebell at home; bands and dumbbells 2# and 5# and 15# at home  PAIN:  Are you having pain? 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 No  LIVING ENVIRONMENT: Lives with: lives with their family Lives in: House/apartment  OCCUPATION: full time pharmacist ACTIVITY:  [place] riding 1x/week, run in the backyard 2x/week;  agility with the dog  PLOF: Independent  PATIENT GOALS for the pain not make me cry.  I cry when I'm by myself .  OBJECTIVE:   DIAGNOSTIC FINDINGS:  none  PATIENT SURVEYS:  FOTO 42% 10/10: 63%   COGNITION: Overall cognitive status: Within functional limits for tasks  assessed   POSTURE: mild head forward; prominent scalenes and SCM  PALPATION: Cervical joint mobility WNLS;  decreased soft tissue mobility upper traps, suboccipitals; masseters right > left  CERVICAL ROM:   Active ROM A/PROM (deg) eval A/ROM 04/02/22 9/12: 05/08/22 _0  Flexion 45  42  55    Extension 57  62  45    Right lateral flexion 48  52 50 38 40 45  Left lateral flexion 44  48 50 38 40 45  Right rotation 50 59 55 60 48  55  Left rotation 57 60 60 60 56  55   (Blank rows = not tested)   Decreased thoracic extension and rotation mobility  UPPER EXTREMITY ROM: WFLS , reports right shoulder clicking  UPPER EXTREMITY MMT:   Decreased strength cervical extensors and deep cervical flexors, middle and lower traps grossly 4-/5 10/10:  cervical extensors and deep cervical flexors, middle and lower traps grossly 4/5  CERVICAL SPECIAL TESTS:  Distraction test: Negative    TODAY'S TREATMENT:  11/7: UBE 4 min L1 forward/back while discussing status Standing on foam 5# kettlebell weight pass around waist 2x10 Standing on foam 5# kb halos 10x 10# staggered stance to press overhead 10x each side  D1 flexion diagonal blue band 10x right/left Counter push ups 10x 30# dead lifts to below knee level 10x2 Seated matrix row 40# Manual therapy:soft tissue mobilization bil cervical paraspinal, upper trap, bil suboccipital Trigger Point Dry-Needling  Treatment instructions: Expect mild to moderate muscle soreness. S/S of pneumothorax if dry needled over a lung field, and to seek immediate medical attention should they occur. Patient verbalized understanding of these instructions and education Patient Consent Given: Yes Education handout provided: Previously provided Muscles treated: bil cervical multifidi, Bil upper traps, bil suboccipitals Elongation after needling to neck and upper traps  Treatment response/outcome: improved soft tissue mobility             06/06/23 UBE 5 min L1 forward/back while discussing status 1/2 kneel on BOSU weight pass around weight 8x each direction 1/2 kneel on BOSU  press overhead 10x each side 30# dead lifts to knee level 10x Blue band lat pulls: regular stance, staggered stance to each side 8x  each way Plank on edge of mat finger to knee touch 10x Tall kneel blue band horizontal rows 10x Manual therapy:soft tissue mobilization bil cervical paraspinal, upper trap, bil suboccipital Trigger Point Dry-Needling  Treatment instructions: Expect mild to moderate muscle soreness. S/S of pneumothorax if dry needled over a lung field, and to seek immediate medical attention should they occur. Patient verbalized understanding of these instructions and education Patient Consent Given: Yes Education handout provided: Previously provided Muscles treated: bil cervical multifidi, Bil upper traps, bil suboccipitals Elongation after needling to neck and upper traps  Treatment response/outcome: improved soft tissue mobility     PATIENT EDUCATION:  Education details: anti inflammatory strategies; dry needling after care Person educated: Patient Education method: Explanation Education comprehension: verbalized understanding   HOME EXERCISE PROGRAM:  Access Code: [email or web address] URL: [email or web address] Date: 03/28/2022 Prepared by: Ruben Im  Exercises - Neck Mobilization with Foam Roller  - 1 x daily - 7 x weekly - 1 sets - 10 reps - Supine Static Chest Stretch on Foam Roll  - 1 x daily - 7 x weekly - 3 sets - 10 reps - Overhead Reach on Foam Roll  - 1 x daily - 7 x weekly - 1 sets - 10 reps - Thoracic Mobilization with Hands Behind Head on Foam Roll  - 1 x daily - 7 x weekly - 1 sets - 10 reps - Sidelying Open Book Thoracic Rotation with Knee on Foam Roll  - 1 x daily - 7 x weekly - 1 sets - 10 reps - Supine Deep Neck Flexor Training  - 1 x daily - 7 x weekly - 1 sets - 10 reps -  Seated Thoracic Lumbar Extension with Pectoralis Stretch  - 1 x daily - 7 x weekly - 1 sets - 10 reps - Standing Row with Anchored Resistance  - 1 x daily - 7 x weekly - 2 sets - 10 reps - Shoulder extension with resistance - Neutral  - 1 x daily - 7 x weekly - 2 sets - 10 reps - Standing Shoulder Flexion to 90 Degrees with Dumbbells  - 1 x daily - 7 x weekly - 1 sets - 8 reps - Half Deadlift with Kettlebell  - 1 x daily - 7 x weekly - 1 sets - 10 reps - Farmer's Carry with Kettlebells  - 1 x daily - 7 x weekly - 10 reps  Added today 04/02/22: Cervical SELF SNAGS  ASSESSMENT:  CLINICAL IMPRESSION:  Able to steadily progress load with strengthening without pain exacerbation.  The patient benefits from dry needling and manual therapy to stimulate underlying myofascial trigger points with much improved soft tissue mobility and decreased tender point size and number following treatment session.   On track to meet LTGs.    OBJECTIVE IMPAIRMENTS decreased activity tolerance, decreased ROM, decreased strength, increased fascial restrictions, increased muscle spasms, and pain.   ACTIVITY LIMITATIONS carrying, lifting, sitting, standing, and caring for others  PARTICIPATION LIMITATIONS: cleaning, driving, and occupation  PERSONAL FACTORS Past/current experiences, Profession, Time since onset of injury/illness/exacerbation, and 1-2 comorbidities: depression,asthma, fibromyalgia  are also affecting patient's functional outcome.   REHAB POTENTIAL: Good  CLINICAL DECISION MAKING: Evolving/moderate complexity  EVALUATION COMPLEXITY: Moderate   GOALS: Goals reviewed with patient? Yes  SHORT TERM GOALS: Target date: 04/12/2022   The patient will demonstrate knowledge of basic self care strategies and exercises to promote healing   Baseline:  Goal status: goal met 9/12  2.  The patient will report  a 30% improvement in pain levels with functional activities which are currently difficult including  home and work tasks Baseline: not able to rate today due to changes with her workload Goal status: ongoing  3.  Improved cervical rotation to 60 degrees bil needed for driving Baseline: 60 Goal status: MET   LONG TERM GOALS: Target date: 07/10/2022  The patient will be independent in a safe self progression of a home exercise program to promote further recovery of function   Baseline:  Goal status: ongoing  2.  The patient will report a 60% improvement in pain levels with functional activities which are currently difficult including work tasks Baseline:  Goal status: ongoing  3.  Improved cervical sidebending ROM to 52 degrees and rotation to 60 degrees needed for driving and work duties Baseline:  Goal status: ongoing  4.  Improved cervical, scapular and glenohumeral strength to grossly 4+/5 needed for home and work ADLs Baseline:  Goal status: ongoing  5.  The patient will have improved FOTO score to  58% indicating improved function with less pain  Baseline:  Goal status: goal met 10/10   PLAN: PT FREQUENCY: 1X/week   PT DURATION: 8 weeks  PLANNED INTERVENTIONS: Therapeutic exercises, Therapeutic activity, Neuromuscular re-education, Patient/Family education, Self Care, Joint mobilization, Aquatic Therapy, Dry Needling, Electrical stimulation, Spinal mobilization, Cryotherapy, Moist heat, Taping, Traction, Ultrasound, Ionotophoresis 63m/ml Dexamethasone, Manual therapy, and Re-evaluation  PLAN FOR NEXT SESSION:  pectoral stretches with head turns; face pulls with green band;  progressive upper quarter strengthening, encourage thoracic and cervical flexibility, DN as needed  SRuben Im PT 06/12/22 5:42 PM Phone: [phone]: [phone] [address] [place] [place] [address]Phone # [phone] [phone]

## 2022-06-13 ENCOUNTER — Encounter: Payer: Self-pay | Admitting: Gastroenterology

## 2022-06-13 ENCOUNTER — Ambulatory Visit: Payer: Managed Care, Other (non HMO) | Admitting: Gastroenterology

## 2022-06-13 VITALS — BP 104/62 | HR 62 | Ht 63.0 in | Wt 137.5 lb

## 2022-06-13 DIAGNOSIS — K625 Hemorrhage of anus and rectum: Secondary | ICD-10-CM | POA: Diagnosis not present

## 2022-06-13 DIAGNOSIS — K648 Other hemorrhoids: Secondary | ICD-10-CM

## 2022-06-13 MED ORDER — HYDROCORTISONE ACETATE 25 MG RE SUPP: RECTAL | 1 refills | Status: DC

## 2022-06-13 NOTE — Patient Instructions (Signed)
We have sent the following medications to your pharmacy for you to pick up at your convenience: Hydrocortisone suppositories  We have provided you with a [facility] HemBand handout. If you decide you want to proceed with banding call and set up an appointment with Dr Lorin Picket E. Tomasa Rand.   I appreciate the opportunity to care for you. Doug Sou, PA-C

## 2022-06-13 NOTE — Progress Notes (Signed)
Agree with the assessment and plan as outlined by Jessica Zehr, PA-C.  Dewon Mendizabal E. Kennadi Albany, MD  [facility]  

## 2022-06-13 NOTE — Progress Notes (Signed)
06/13/2022 Adrienne Williams [id number] 1981-04-07   HISTORY OF PRESENT ILLNESS:  This is a 41 year old female who is a patient of Dr. Milas Hock.  She is here today with complaints of hemorrhoidal bleeding.  She just had a colonoscopy in June of this year that showed a normal exam.  She is here today stating that she has had intermittent rectal bleeding in the past, but recently she had an episode that lasted all day.  Was bright red blood in the toilet bowl.  Eventually stopped and has not recurred since then but had never had it last a whole day in the past.  Denies any rectal/anal pain, but some discomfort.  Says that she's had issues with hemorrhoids on and off in the past during pregnancy, etc.   Past Medical History:  Diagnosis Date   Anxiety    Asthma    Depression    Family history of breast cancer    Frank breech presentation 05/29/2018   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   Hemorrhoids    Hx of varicella    PONV (postoperative nausea and vomiting)    "little nauseated"   Postpartum care following cesarean delivery (6/14) 01/18/2016   Past Surgical History:  Procedure Laterality Date   CESAREAN SECTION N/A 01/18/2016   Procedure: Primary CESAREAN SECTION;  Surgeon: Genia Del, MD;  Location: [facility];  Service: Obstetrics;  Laterality: N/A;  EDD: 01/25/16   CESAREAN SECTION N/A 05/29/2018   Procedure: Repeat CESAREAN SECTION;  Surgeon: Shea Evans, MD;  Location: [facility];  Service: Obstetrics;  Laterality: N/A;  EDD: 06/10/18   COLONOSCOPY     CYSTECTOMY  2002   pilonidal region   DIAGNOSTIC LAPAROSCOPY WITH REMOVAL OF ECTOPIC PREGNANCY Right 05/29/2020   Procedure: DIAGNOSTIC LAPAROSCOPY WITH REMOVAL OF ECTOPIC PREGNANCY;  Surgeon: Shea Evans, MD;  Location: [facility] OR;  Service: Gynecology;  Laterality: Right;   MOLE REMOVAL     UNILATERAL SALPINGECTOMY Right 05/29/2020   Procedure: UNILATERAL SALPINGECTOMY;   Surgeon: Shea Evans, MD;  Location: [facility] OR;  Service: Gynecology;  Laterality: Right;   WISDOM TOOTH EXTRACTION      reports that she has never smoked. She has never used smokeless tobacco. She reports that she does not drink alcohol and does not use drugs. family history includes ADD / ADHD in her brother; Angina in her paternal grandfather; Atrial fibrillation in her paternal grandmother; Breast cancer (age of onset: 12) in her maternal grandmother; Cancer in her maternal grandmother; Colon polyps in her paternal grandfather; Hyperlipidemia in her paternal grandfather; Hypertension in her paternal grandfather; Hypothyroidism in her paternal grandmother. Allergies  Allergen Reactions   Benadryl [Diphenhydramine] Other (See Comments)    Makes skin crawl - pt prefers not to take    Prednisone     Pt can not sleep - prefers not to take      Outpatient Encounter Medications as of 06/13/2022  Medication Sig   ADVAIR DISKUS 100-50 MCG/ACT AEPB Inhale 1 puff into the lungs 2 (two) times daily.   albuterol (VENTOLIN HFA) 108 (90 Base) MCG/ACT inhaler Inhale into the lungs.   buPROPion (WELLBUTRIN XL) 300 MG 24 hr tablet Take 300 mg by mouth daily.   busPIRone (BUSPAR) 30 MG tablet Take 30 mg by mouth 2 (two) times daily.   cetirizine (ZYRTEC) 10 MG tablet Take 10 mg by mouth daily.   EPINEPHrine 0.3 mg/0.3 mL IJ SOAJ injection SMARTSIG:0.3  Milligram(s) IM Once PRN   LORazepam (ATIVAN) 1 MG tablet Take by mouth.   magnesium oxide (MAG-OX) 400 (241.3 Mg) MG tablet Take 1 tablet (400 mg total) by mouth daily. (Patient taking differently: Take 800 mg by mouth daily.)   montelukast (SINGULAIR) 10 MG tablet Take 10 mg by mouth at bedtime.   NON FORMULARY Allergy shots once every 2 weeks   Omega-3 Fatty Acids (FISH OIL) 1000 MG CAPS Take 1 capsule by mouth daily.   polyethylene glycol powder (GLYCOLAX/MIRALAX) 17 GM/SCOOP powder Take 1 Container by mouth once. Every other day   spironolactone  (ALDACTONE) 50 MG tablet 2 tablets every day by oral route.   VRAYLAR 1.5 MG capsule Take 1.5 mg by mouth daily.   zolpidem (AMBIEN) 10 MG tablet 1 tablet every day by oral route.   lamoTRIgine (LAMICTAL) 25 MG tablet Take 50 mg by mouth daily.   spironolactone (ALDACTONE) 100 MG tablet    zolpidem (AMBIEN) 10 MG tablet Take 10 mg by mouth at bedtime as needed.   No facility-administered encounter medications on file as of 06/13/2022.     REVIEW OF SYSTEMS  : All other systems reviewed and negative except where noted in the History of Present Illness.   PHYSICAL EXAM: BP 104/62   Pulse 62   Ht 5\' 3"  (1.6 m)   Wt 137 lb 8 oz (62.4 kg)   BMI 24.36 kg/m  General: Well developed white female in no acute distress Rectal:  Declined today.  ASSESSMENT AND PLAN: *Hemorrhoids, internal:  Intermittent bleeding.  Bleeding resolved now, but would like to consider hemorrhoid banding if it recurs like it did on this last occasion.  Was given literature on this and was advised that she would need to schedule appt with and see Dr. for banding.  I sent a prescription for hydrocortisone suppositories as well for her to use prn.  Colonoscopy in June 2023 otherwise normal.   CC:  July 2023, PA-C

## 2022-06-19 ENCOUNTER — Encounter: Payer: Self-pay | Admitting: Physical Medicine and Rehabilitation

## 2022-06-19 ENCOUNTER — Ambulatory Visit: Payer: Managed Care, Other (non HMO) | Admitting: Physical Therapy

## 2022-06-19 DIAGNOSIS — M6281 Muscle weakness (generalized): Secondary | ICD-10-CM

## 2022-06-19 DIAGNOSIS — M542 Cervicalgia: Secondary | ICD-10-CM

## 2022-06-19 DIAGNOSIS — R252 Cramp and spasm: Secondary | ICD-10-CM

## 2022-06-19 NOTE — Therapy (Signed)
OUTPATIENT PHYSICAL THERAPY CERVICAL PROGRESS NOTE Patient Name: Adrienne Williams MRN: [id number] DOB:[birth year], 41 y.o., female      PT End of Session - 06/19/22 0834     Visit Number 15    Date for PT Re-Evaluation 07/10/22    Authorization Type [employer]    PT Start Time [phone]   PT Stop Time 0916   PT Time Calculation (min) 38 min    Activity Tolerance Patient tolerated treatment well              Past Medical History:  Diagnosis Date   Anxiety    Asthma    Depression    Family history of breast cancer    Pilar Plate breech presentation 05/29/2018   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   Hemorrhoids    Hx of varicella    PONV (postoperative nausea and vomiting)    "little nauseated"   Postpartum care following cesarean delivery (6/14) 01/18/2016   Past Surgical History:  Procedure Laterality Date   CESAREAN SECTION N/A 01/18/2016   Procedure: Primary CESAREAN SECTION;  Surgeon: Princess Bruins, MD;  Location: [place];  Service: Obstetrics;  Laterality: N/A;  EDD: 01/25/16   CESAREAN SECTION N/A 05/29/2018   Procedure: Repeat CESAREAN SECTION;  Surgeon: Azucena Fallen, MD;  Location: [place];  Service: Obstetrics;  Laterality: N/A;  EDD: 06/10/18   COLONOSCOPY     CYSTECTOMY  2002   pilonidal region   DIAGNOSTIC LAPAROSCOPY WITH REMOVAL OF ECTOPIC PREGNANCY Right 05/29/2020   Procedure: DIAGNOSTIC LAPAROSCOPY WITH REMOVAL OF ECTOPIC PREGNANCY;  Surgeon: Azucena Fallen, MD;  Location: [place];  Service: Gynecology;  Laterality: Right;   MOLE REMOVAL     UNILATERAL SALPINGECTOMY Right 05/29/2020   Procedure: UNILATERAL SALPINGECTOMY;  Surgeon: Azucena Fallen, MD;  Location: [place];  Service: Gynecology;  Laterality: Right;   WISDOM TOOTH EXTRACTION     Patient Active Problem List   Diagnosis Date Noted   Family history of breast cancer 62/94/7654   Monoallelic mutation of CHEK2 gene in female patient 01/18/2022   Anxiety and  depression 05/31/2018   Rh negative, maternal 05/31/2018   Pilar Plate breech presentation 05/29/2018   Status post repeat low transverse cesarean section 05/29/2018   Postpartum care following cesarean delivery (10/24) 05/29/2018    PCP: Marda Stalker PA  REFERRING PROVIDER: Angelina Pih MD  REFERRING DIAG: M54.2 chronic neck pain   THERAPY DIAG:  Cervicalgia; spasm; weakness  Rationale for Evaluation and Treatment Rehabilitation  ONSET DATE: "Forever" 08/06/21  SUBJECTIVE:  SUBJECTIVE STATEMENT: Been working on my speed with 1 mile running.  Pain is variable but Ok today.    PERTINENT HISTORY:  Asthma; fibromyalgia; depression (pt feels this is well controlled); restarted Baclofen  Has a kettlebell at home; bands and dumbbells 2# and 5# and 15# at home  PAIN:  Are you having pain? 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 No  LIVING ENVIRONMENT: Lives with: lives with their family Lives in: House/apartment  OCCUPATION: full time pharmacist ACTIVITY:  [place] riding 1x/week, run in the backyard 2x/week;  agility with the dog  PLOF: Independent  PATIENT GOALS for the pain not make me cry.  I cry when I'm by myself .  OBJECTIVE:   DIAGNOSTIC FINDINGS:  none  PATIENT SURVEYS:  FOTO 42% 10/10: 63%   COGNITION: Overall cognitive status: Within functional limits for tasks assessed   POSTURE: mild head forward; prominent  scalenes and SCM  PALPATION: Cervical joint mobility WNLS;  decreased soft tissue mobility upper traps, suboccipitals; masseters right > left  CERVICAL ROM:   Active ROM A/PROM (deg) eval A/ROM 04/02/22 9/12: 05/08/22 _0  Flexion 45  42  55    Extension 57  62  45    Right lateral flexion 48  52 50 38 40 45  Left lateral flexion 44  48 50 38 40 45  Right rotation 50 59 55 60 48  55  Left rotation 57 60 60 60 56  55   (Blank rows = not tested)   Decreased thoracic extension and rotation mobility  UPPER EXTREMITY ROM: WFLS , reports right shoulder clicking  UPPER EXTREMITY MMT:   Decreased strength cervical extensors and deep cervical flexors, middle and lower traps grossly 4-/5 10/10:  cervical extensors and deep cervical flexors, middle and lower traps grossly 4/5  CERVICAL SPECIAL TESTS:  Distraction test: Negative    TODAY'S TREATMENT:  11/14: Seated sit up with 10# weight press overhead 10x Standing 10# kettlebell cleans 10x right/left  Counter push ups 10x Orange Plyo ball chops 10x each side 11# 30# dead lifts to below knee level 10x2 Seated matrix row 40#  10x Quadruped thread the needle using foam roll/open book combo 10x right/left Manual therapy:soft tissue mobilization bil cervical paraspinal, upper trap, bil suboccipital Trigger Point Dry-Needling  Treatment instructions: Expect mild to moderate muscle soreness. S/S of pneumothorax if dry needled over a lung field, and to seek immediate medical attention should they occur. Patient verbalized understanding of these instructions and education Patient Consent Given: Yes Education handout provided: Previously provided Muscles treated: bil cervical multifidi, Bil upper traps, bil suboccipitals Elongation after needling to neck and upper traps  Treatment response/outcome: improved soft tissue mobility        11/7: UBE 4 min L1 forward/back while discussing status Standing on foam 5# kettlebell  weight pass around waist 2x10 Standing on foam 5# kb halos 10x 10# staggered stance to press overhead 10x each side  D1 flexion diagonal blue band 10x right/left Counter push ups 10x 30# dead lifts to below knee level 10x2 Seated matrix row 40# Manual therapy:soft tissue mobilization bil cervical paraspinal, upper trap, bil  suboccipital Trigger Point Dry-Needling  Treatment instructions: Expect mild to moderate muscle soreness. S/S of pneumothorax if dry needled over a lung field, and to seek immediate medical attention should they occur. Patient verbalized understanding of these instructions and education Patient Consent Given: Yes Education handout provided: Previously provided Muscles treated: bil cervical multifidi, Bil upper traps, bil suboccipitals Elongation after needling to neck and upper traps  Treatment response/outcome: improved soft tissue mobility         PATIENT EDUCATION:  Education details: anti inflammatory strategies; dry needling after care Person educated: Patient Education method: Explanation Education comprehension: verbalized understanding   HOME EXERCISE PROGRAM:  Access Code: [email or web address] URL: [email or web address] Date: 03/28/2022 Prepared by: Ruben Im  Exercises - Neck Mobilization with Foam Roller  - 1 x daily - 7 x weekly - 1 sets - 10 reps - Supine Static Chest Stretch on Foam Roll  - 1 x daily - 7 x weekly - 3 sets - 10 reps - Overhead Reach on Foam Roll  - 1 x daily - 7 x weekly - 1 sets - 10 reps - Thoracic Mobilization with Hands Behind Head on Foam Roll  - 1 x daily - 7 x weekly - 1 sets - 10 reps - Sidelying Open Book Thoracic Rotation with Knee on Foam Roll  - 1 x daily - 7 x weekly - 1 sets - 10 reps - Supine Deep Neck Flexor Training  - 1 x daily - 7 x weekly - 1 sets - 10 reps - Seated Thoracic Lumbar Extension with Pectoralis Stretch  - 1 x daily - 7 x weekly - 1 sets - 10 reps - Standing Row with Anchored Resistance   - 1 x daily - 7 x weekly - 2 sets - 10 reps - Shoulder extension with resistance - Neutral  - 1 x daily - 7 x weekly - 2 sets - 10 reps - Standing Shoulder Flexion to 90 Degrees with Dumbbells  - 1 x daily - 7 x weekly - 1 sets - 8 reps - Half Deadlift with Kettlebell  - 1 x daily - 7 x weekly - 1 sets - 10 reps - Farmer's Carry with Kettlebells  - 1 x daily - 7 x weekly - 10 reps  Added today 04/02/22: Cervical SELF SNAGS  ASSESSMENT:  CLINICAL IMPRESSION:  Able to steadily progress intensity of strengthening and complexity of movement with a good overall response (less tightness).  Verbal cues for hip hinge with rows and lifts.   Improved tolerance to DN and manual therapy with fewer myofascial tender points and improved soft tissue mobility at the end of session.  Therapist monitoring response to all interventions and modifying treatment accordingly.      OBJECTIVE IMPAIRMENTS decreased activity tolerance, decreased ROM, decreased strength, increased fascial restrictions, increased muscle spasms, and pain.   ACTIVITY LIMITATIONS carrying, lifting, sitting, standing, and caring for others  PARTICIPATION LIMITATIONS: cleaning, driving, and occupation  PERSONAL FACTORS Past/current experiences, Profession, Time since onset of injury/illness/exacerbation, and 1-2 comorbidities: depression,asthma, fibromyalgia  are also affecting patient's functional outcome.   REHAB POTENTIAL: Good  CLINICAL DECISION MAKING: Evolving/moderate complexity  EVALUATION COMPLEXITY: Moderate   GOALS: Goals reviewed with patient? Yes  SHORT TERM GOALS: Target date: 04/12/2022   The patient will demonstrate knowledge of basic self care strategies and exercises to promote healing   Baseline:  Goal status: goal met 9/12  2.  The patient will report a 30% improvement in pain levels with functional activities  which are currently difficult including home and work tasks Baseline: not able to rate today due to  changes with her workload Goal status: ongoing  3.  Improved cervical rotation to 60 degrees bil needed for driving Baseline: 60 Goal status: MET   LONG TERM GOALS: Target date: 07/10/2022  The patient will be independent in a safe self progression of a home exercise program to promote further recovery of function   Baseline:  Goal status: ongoing  2.  The patient will report a 60% improvement in pain levels with functional activities which are currently difficult including work tasks Baseline:  Goal status: ongoing  3.  Improved cervical sidebending ROM to 52 degrees and rotation to 60 degrees needed for driving and work duties Baseline:  Goal status: ongoing  4.  Improved cervical, scapular and glenohumeral strength to grossly 4+/5 needed for home and work ADLs Baseline:  Goal status: ongoing  5.  The patient will have improved FOTO score to  58% indicating improved function with less pain  Baseline:  Goal status: goal met 10/10   PLAN: PT FREQUENCY: 1X/week   PT DURATION: 8 weeks  PLANNED INTERVENTIONS: Therapeutic exercises, Therapeutic activity, Neuromuscular re-education, Patient/Family education, Self Care, Joint mobilization, Aquatic Therapy, Dry Needling, Electrical stimulation, Spinal mobilization, Cryotherapy, Moist heat, Taping, Traction, Ultrasound, Ionotophoresis 65m/ml Dexamethasone, Manual therapy, and Re-evaluation  PLAN FOR NEXT SESSION:  follow up after Thanksgiving;  pectoral stretches with head turns; face pulls with green band;  progressive upper quarter strengthening, encourage thoracic and cervical flexibility, DN as needed    SRuben Im PT 06/19/22 9:17 AM Phone: [phone]: [phone] [address] [place] [address]Phone # [phone] [phone]

## 2022-06-26 ENCOUNTER — Ambulatory Visit: Payer: Managed Care, Other (non HMO) | Admitting: Physical Therapy

## 2022-06-27 ENCOUNTER — Encounter: Payer: Self-pay | Admitting: Physical Medicine and Rehabilitation

## 2022-06-27 ENCOUNTER — Encounter
Payer: Managed Care, Other (non HMO) | Attending: Physical Medicine and Rehabilitation | Admitting: Physical Medicine and Rehabilitation

## 2022-06-27 VITALS — BP 91/65 | HR 76 | Ht 63.0 in | Wt 137.0 lb

## 2022-06-27 DIAGNOSIS — M542 Cervicalgia: Secondary | ICD-10-CM | POA: Insufficient documentation

## 2022-06-27 DIAGNOSIS — G8929 Other chronic pain: Secondary | ICD-10-CM | POA: Diagnosis present

## 2022-06-27 DIAGNOSIS — F32A Depression, unspecified: Secondary | ICD-10-CM | POA: Diagnosis present

## 2022-06-27 DIAGNOSIS — M797 Fibromyalgia: Secondary | ICD-10-CM | POA: Insufficient documentation

## 2022-06-27 DIAGNOSIS — M7918 Myalgia, other site: Secondary | ICD-10-CM | POA: Diagnosis not present

## 2022-06-27 DIAGNOSIS — F419 Anxiety disorder, unspecified: Secondary | ICD-10-CM | POA: Diagnosis not present

## 2022-06-27 MED ORDER — DULOXETINE HCL 30 MG PO CPEP: 30.0000 mg | ORAL_CAPSULE | Freq: Every day | ORAL | 3 refills | Status: DC

## 2022-06-27 NOTE — Patient Instructions (Addendum)
Start Cymbalta with one capsule daily for 2 weeks. If tolerating well, increase to 2 capsules daily after 2 weeks. Continue on this dose until otherwise instructed by your doctor. If any concerns regarding medication side effects, call your doctor's office and do not stop the medication without tapering.   Stop Tramadol.   Continue physical activity and finish out your PT.   I will schedule you with another provider for trigger point injections in 1-2 weeks.  I have ordered xrays of your neck to evaluate for arthritis and disc disease; we will call you with results once these are available and let you know if any further action needs to be taken.   Follow up with me in 3 months. At that time, we mas discuss increasing Cymbalta or switching to alternative medications like low dose naltrexone.

## 2022-06-27 NOTE — Assessment & Plan Note (Addendum)
Continue physical activity and finish out your PT.   I have ordered xrays of your neck to evaluate for arthritis and disc disease; we will call you with results once these are available and let you know if any further action needs to be taken.

## 2022-06-27 NOTE — Progress Notes (Signed)
Subjective:    Patient ID: Adrienne Williams, female    DOB: 29-Jun-1981, 41 y.o.   MRN: [id number]  HPI  Adrienne Williams is a 41 y.o. year old female  who  has a past medical history of Anxiety, Asthma, Depression, Family history of breast cancer, Frank breech presentation (05/29/2018), Gene mutation, Hemorrhoids, varicella, PONV (postoperative nausea and vomiting), and Postpartum care following cesarean delivery (6/14) (01/18/2016).   They are presenting to PM&R clinic as a new patient for pain management evaluation. They were referred by Lurline Del, DO  for treatment of fibromyalgia pain.   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  Duration of pain: Intermittent but can last for weeks at a time.  Description of pain: "squeezing"; no aching, stabbing, burning.  Severity: On average 5/10. At worst 7/10. At best 3/10. Exacerbating factors: Working long shifts at her job (11 hour shifts; by the second day). Driving short distances (20-30 minutes).  Remitting factors: Exercise; will sometimes do excessively (ex. Ran 75 minutes recently trying to relieve pain). Massage helps.   Red flag symptoms: Patient denies saddle anesthesia, loss of bowel or bladder continence, new weakness, new numbness/tingling, or pain waking up at nighttime.  Medications tried: Topical medications ( no effect) : Has tried [place] without benefit.  Nsaids ( no effect): No effect with several days in a row.  Tylenol  ( no effect): No effect with several days in a row.  Opiates  ( good effect): Tramadol 50 mg prn, given #7 tabs on 11/13 by PCP; no other scripts. Has only been taking 0.5 tabs, but has worked very well for her. Used to be on Hydrocodone 10 years ago, not sure of dose, was 2-3x daily. She came off because "my pain got better"; no side effects.  Gabapentin / Lyrica  ( ? effect): Has been on both,  had drowsiness with gabapentin and unsure with Lyrica (?swelling, patient unsure) TCAs  ( no effect): Elavil tried, made her feel "really out of it". Orthostatic.  SNRIs  ( no effect): Used to be on Cymbalta, was on 60 mg once daily, does not remember it helping after 1 year. On Buspar 30 mg BID currently, has been on for 4 years with good anxiety control but poor depression control recently.  Other  ( good effect): Baclofen 10 mg TID; finds it helpful with no side effects.   Other treatments: PT/OT  ( good effect): Currently in since August, doing HEP and trigger point dry needling has been the most helpful (helps for 1 day). Accupuncture/chiropractor/massage  ( some effect): Massage is helpful for 1 day or so. Has a massage gun that she uses daily.  Sees a chiropractor every 6 weeks, works for 1 day or so.  TENs unit ( no effect): TENS is "distracting", but does not help with pain.  Injections: never tried Surgery: never tried Other: none  Goals for pain control: Getting through a work day without significant pain. She states she enjoys what she does and does not want to switch jobs, but is frustrated with it as her primary trigger.   Pain Inventory Average Pain 5 Pain Right Now 4 My pain is intermittent and .  In the last 24 hours, has pain interfered with the following? General activity 5 Relation with others 2 Enjoyment of life 6 What TIME of day is your pain at its worst? evening  Sleep (in general) Fair  Pain is worse with: some activites Pain improves with: therapy/exercise and medication Relief from Meds: 5  walk without assistance how many minutes can you walk? 75 ability to climb steps?  yes do you drive?  yes  employed # of hrs/week 32  spasms depression anxiety  Any changes since last visit?  no  Any changes since last visit?  no    Family History  Problem Relation Age of Onset   ADD / ADHD Brother    Breast cancer Maternal Grandmother 4   Cancer  Maternal Grandmother        stomach, possible met from breast cancer   Hypothyroidism Paternal Grandmother    Atrial fibrillation Paternal Grandmother    Angina Paternal Grandfather    Hypertension Paternal Grandfather    Hyperlipidemia Paternal Grandfather    Colon polyps Paternal Grandfather    Colon cancer Neg Hx    Esophageal cancer Neg Hx    Social History   Socioeconomic History   Marital status: Married    Spouse name: Not on file   Number of children: 2   Years of education: Not on file   Highest education level: Not on file  Occupational History   Occupation: Pharmacist  Tobacco Use   Smoking status: Never   Smokeless tobacco: Never  Vaping Use   Vaping Use: Never used  Substance and Sexual Activity   Alcohol use: No   Drug use: No   Sexual activity: Yes  Other Topics Concern   Not on file  Social History Narrative   Not on file   Social Determinants of Health   Financial Resource Strain: Low Risk  (05/26/2018)   Overall Financial Resource Strain (CARDIA)    Difficulty of Paying Living Expenses: Not hard at all  Food Insecurity: No Food Insecurity (05/26/2018)   Hunger Vital Sign    Worried About Running Out of Food in the Last Year: Never true    Ran Out of Food in the Last Year: Never true  Transportation Needs: Unknown (05/26/2018)   PRAPARE - Hydrologist (Medical): No    Lack of Transportation (Non-Medical): Not on file  Physical Activity: Not on file  Stress: Stress Concern Present (05/26/2018)   [place]    Feeling of Stress : To some extent  Social Connections: Not on file   Past Surgical History:  Procedure Laterality Date   CESAREAN SECTION N/A 01/18/2016   Procedure: Primary CESAREAN SECTION;  Surgeon: Princess Bruins, MD;  Location: [place];  Service: Obstetrics;  Laterality: N/A;  EDD: 01/25/16   CESAREAN SECTION N/A 05/29/2018    Procedure: Repeat CESAREAN SECTION;  Surgeon: Azucena Fallen, MD;  Location: [place];  Service: Obstetrics;  Laterality: N/A;  EDD: 06/10/18   COLONOSCOPY     CYSTECTOMY  2002   pilonidal region   DIAGNOSTIC LAPAROSCOPY WITH REMOVAL OF ECTOPIC PREGNANCY Right 05/29/2020   Procedure: DIAGNOSTIC LAPAROSCOPY WITH REMOVAL OF ECTOPIC PREGNANCY;  Surgeon: Azucena Fallen, MD;  Location: [place];  Service: Gynecology;  Laterality: Right;   MOLE REMOVAL     UNILATERAL SALPINGECTOMY Right 05/29/2020   Procedure: UNILATERAL SALPINGECTOMY;  Surgeon: Azucena Fallen, MD;  Location: [place];  Service: Gynecology;  Laterality: Right;   WISDOM TOOTH EXTRACTION     Past Medical History:  Diagnosis Date   Anxiety    Asthma    Depression    Family  history of breast cancer    Pilar Plate breech presentation 05/29/2018   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       B/L shoulder pain   All other systems reviewed and are negative.     Objective:   Physical Exam   Constitution: Appropriate appearance for age. No apparnet distress    HEENT: PERRL, EOMI grossly intact.  Resp: CTAB. No rales, rhonchi, or wheezing. Cardio: RRR. No mumurs, rubs, or gallops. No peripheral edema. Abdomen: Nondistended. Nontender. +bowel sounds. Psych: Dysphoric mood, tearful when discussing medical history and work.  Neuro: AAOx4. No apparent deficits, Fine motor intact. No ataxia on FTN. Sensation intact in C3-T1 dermatomes.  MSK: 5/5 strength bilateral grip, finger abduction, elbow  flexion/extension, and shoulder abduction.  Cervical Exam:  Inspection: Shoulder girdles were even, normal bulk and tone. The cervical lordotic curvature was WNL.  Palpatory exam: Mild TTP over bilateral upper cervical paraspinals; no ttp over mid- or -low cervical paraspinals, traps, rotator cuff muscles, biceps, or rhomboids. There was no muscle spasm, but there was significant tightness throughout neck and traps palpated, with a few trigger points noted.  ROM: Flexion, Extension, Rotation, and Sidebending WNL bilaterally.   Special/Provocative tests:  Radiculopathy Special Tests: Spurling's maneuver : Negative Lhermitte's sign: negative  Myelopathy Special Tests: UMN signs: no hyperreflexia, negative hoffman's Balance Impairement: none appreciated      Assessment & Plan:    Adrienne Williams is a 41 y.o. year old female  who  has a past medical history of Anxiety, Asthma, Depression, Family history of breast cancer, Frank breech presentation (05/29/2018), Gene mutation, Hemorrhoids, varicella, PONV (postoperative nausea and vomiting), and Postpartum care following cesarean delivery (6/14) (01/18/2016).   They are presenting to PM&R clinic as a new patient for pain management evaluation. They were referred by Lurline Del, DO  for treatment of fibromyalgia pain. Exam today was relatively unrevealing; no neurologic or MSK deficits, and no hypersensitivity appreciated. Patient did discuss poor control of multiple life stressors, and stressed the link between her job and exacerbation of her pain.   Fibromyalgia Assessment & Plan: Suggested work accommodations letter to allow rest breaks or limited hours to improve her symptoms, as work is her primary trigger. Did briefly discuss ADA laws for reasonable accommodations; patient endorses accommodations are not feasible and refuses at this time.   Prescribed Cymbalta 30 mg daily to start, increase to 60 mg daily after 2 weeks. Patient to  call office in 1 week to report on medication tolerance. She has done well on this medication in the past without side effects for primarily mood/depression, which is also poorly controlled at this time.   Unfortunately, patient has failed Elavil, Gabapentin, and Lyrica d/t side effects in the past.   Advised patient to stop Tramadol, as this increases risk of serotonin syndrome and is an inferior medication for control of fibromyalgia symptoms.  Follow up with me in 3 months. At that time, we may discuss increasing Cymbalta (up to 120 mg daily) or switching to alternative medications like low dose naltrexone.    Neck pain, chronic Assessment & Plan: Continue physical activity and finish out your PT.   I have ordered xrays of your neck to evaluate for arthritis and disc disease; we will call you with results once these are available and let you know if any further action needs to be taken.   Orders: -     DG Cervical Spine 2 or 3 views; Future  Anxiety and depression Assessment & Plan: Currently on Buspar, ativan, ambien, wellbutrin, and vraylar.  Reports anxiety relatively well controlled, but depression worsening due to life stressors.   Will send visit note to PCP for ongoing continuity of care.    Myofascial pain Assessment & Plan: I will schedule you with another provider for trigger point injections in 1-2 weeks.  Continue Baclofen    Other orders -     DULoxetine HCl; Take 1 capsule (30 mg total) by mouth daily. Start with one capsule daily for 2 weeks. If tolerating well, increase to 2 capsules daily after 2 weeks. Continue on this dose until otherwise instructed by your doctor. If any concerns regarding medication side effects, call your doctor's office and do not stop the medication without tapering.  Dispense: 60 capsule; Refill: [place], DO 06/27/2022

## 2022-06-27 NOTE — Assessment & Plan Note (Addendum)
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 Did briefly discuss ADA laws for reasonable accommodations; patient endorses accommodations are not feasible and refuses at this time.   Prescribed Cymbalta 30 mg daily to start, increase to 60 mg daily after 2 weeks. Patient to call office in 1 week to report on medication tolerance. She has done well on this medication in the past without side effects for primarily mood/depression, which is also poorly controlled at this time.   Unfortunately, patient has failed Elavil, Gabapentin, and Lyrica d/t side effects in the past.   Advised patient to stop Tramadol, as this increases risk of serotonin syndrome and is an inferior medication for control of fibromyalgia symptoms.  Follow up with me in 3 months. At that time, we may discuss increasing Cymbalta (up to 120 mg daily) or switching to alternative medications like low dose naltrexone.

## 2022-06-27 NOTE — Assessment & Plan Note (Addendum)
Currently on Buspar, ativan, ambien, wellbutrin, and vraylar.  Reports anxiety relatively well controlled, but depression worsening due to life stressors.   Will send visit note to PCP for ongoing continuity of care.

## 2022-06-27 NOTE — Assessment & Plan Note (Signed)
I will schedule you with another provider for trigger point injections in 1-2 weeks.  Continue Baclofen

## 2022-07-03 ENCOUNTER — Ambulatory Visit: Payer: Managed Care, Other (non HMO) | Admitting: Physical Therapy

## 2022-07-06 ENCOUNTER — Ambulatory Visit: Payer: Managed Care, Other (non HMO) | Attending: Family Medicine | Admitting: Physical Therapy

## 2022-07-06 DIAGNOSIS — R252 Cramp and spasm: Secondary | ICD-10-CM | POA: Diagnosis present

## 2022-07-06 DIAGNOSIS — M542 Cervicalgia: Secondary | ICD-10-CM | POA: Diagnosis present

## 2022-07-06 DIAGNOSIS — M6281 Muscle weakness (generalized): Secondary | ICD-10-CM | POA: Diagnosis present

## 2022-07-06 NOTE — Therapy (Signed)
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   Hemorrhoids    Hx of varicella    PONV (postoperative nausea and vomiting)    "little nauseated"   Postpartum care following cesarean delivery (6/14) 01/18/2016   Past Surgical History:  Procedure Laterality Date   CESAREAN SECTION N/A 01/18/2016   Procedure: Primary CESAREAN SECTION;  Surgeon: Princess Bruins, MD;  Location: [place];  Service: Obstetrics;  Laterality: N/A;  EDD: 01/25/16   CESAREAN SECTION N/A 05/29/2018   Procedure: Repeat CESAREAN SECTION;  Surgeon: Azucena Fallen, MD;  Location: [place];  Service: Obstetrics;  Laterality: N/A;  EDD: 06/10/18   COLONOSCOPY     CYSTECTOMY  2002   pilonidal region   DIAGNOSTIC LAPAROSCOPY WITH REMOVAL OF ECTOPIC PREGNANCY Right 05/29/2020   Procedure: DIAGNOSTIC LAPAROSCOPY WITH REMOVAL OF ECTOPIC PREGNANCY;  Surgeon: Azucena Fallen, MD;  Location: [place];  Service: Gynecology;  Laterality: Right;   MOLE REMOVAL     UNILATERAL SALPINGECTOMY Right 05/29/2020   Procedure: UNILATERAL SALPINGECTOMY;  Surgeon: Azucena Fallen, MD;  Location: [place];  Service: Gynecology;  Laterality: Right;   WISDOM TOOTH EXTRACTION     Patient Active Problem List   Diagnosis Date Noted   Neck pain, chronic 06/27/2022   Fibromyalgia 06/27/2022   Myofascial pain 06/27/2022   Internal hemorrhoids 06/13/2022   Rectal  bleeding 06/13/2022   Family history of breast cancer 78/24/2353   Monoallelic mutation of CHEK2 gene in female patient 01/18/2022   Anxiety and depression 05/31/2018   Rh negative, maternal 05/31/2018   Pilar Plate breech presentation 05/29/2018   Status post repeat low transverse cesarean section 05/29/2018   Postpartum care following cesarean delivery (10/24) 05/29/2018    PCP: Marda Stalker PA  REFERRING PROVIDER: Angelina Pih MD  REFERRING DIAG: M54.2 chronic neck pain   THERAPY DIAG:  Cervicalgia; spasm; weakness  Rationale for Evaluation and Treatment Rehabilitation  ONSET DATE: "[place]" 08/06/21  SUBJECTIVE:  SUBJECTIVE STATEMENT: I ran on _0 /14: Seated sit up with 10# weight press overhead 10x Standing 10# kettlebell cleans 10x right/left  Counter push ups 10x Orange Plyo ball chops 10x each side 11# 30# dead lifts to below knee level 10x2 Seated matrix row 40#  10x Quadruped thread the needle using foam roll/open book combo 10x right/left Manual therapy:soft tissue mobilization bil cervical paraspinal, upper trap, bil suboccipital Trigger Point Dry-Needling  Treatment instructions: Expect mild to moderate muscle soreness. S/S of pneumothorax if dry needled over a lung field, and to seek immediate medical attention should they occur. Patient verbalized understanding of these instructions and education Patient Consent Given: Yes Education handout provided: Previously provided Muscles treated: bil cervical multifidi, Bil upper traps, bil suboccipitals Elongation after needling to neck and upper traps  Treatment response/outcome: improved soft tissue mobility    PATIENT EDUCATION:  Education details: anti inflammatory strategies; dry needling after care Person educated: Patient Education method: Explanation Education comprehension: verbalized understanding   HOME EXERCISE PROGRAM:  Access Code: [email or web address] URL: [email or web address] Date: 03/28/2022 Prepared by: Ruben Im  Exercises - Neck Mobilization with Foam Roller  - 1 x daily - 7 x weekly - 1 sets - 10 reps - Supine Static Chest Stretch on Foam Roll  - 1 x daily - 7 x weekly - 3 sets - 10 reps - Overhead Reach on Foam Roll  - 1 x daily - 7 x weekly - 1 sets - 10 reps - Thoracic Mobilization with Hands Behind Head on Foam Roll  - 1 x daily - 7 x weekly - 1 sets - 10 reps - Sidelying Open Book Thoracic Rotation with Knee  on Foam Roll  - 1 x daily - 7 x weekly - 1 sets - 10 reps - Supine Deep Neck Flexor Training  - 1 x daily - 7 x weekly - 1 sets - 10 reps - Seated Thoracic Lumbar Extension with Pectoralis Stretch  - 1 x daily - 7 x weekly - 1 sets - 10 reps - Standing Row with Anchored Resistance  - 1 x daily - 7 x weekly - 2 sets - 10 reps - Shoulder extension with resistance - Neutral  - 1 x daily - 7 x weekly - 2 sets - 10 reps - Standing Shoulder Flexion to 90 Degrees with Dumbbells  - 1 x daily - 7 x weekly - 1 sets - 8 reps - Half Deadlift with Kettlebell  - 1 x daily - 7 x weekly - 1 sets - 10 reps - Farmer's Carry with Kettlebells  - 1 x daily - 7 x weekly - 10 reps  Added today 04/02/22: Cervical SELF SNAGS  ASSESSMENT:  CLINICAL IMPRESSION:  The patient is able to perform upper quarter strength training with good postural alignment.  Therapist monitoring ex technique to ensure no excessive upper trap compensation.  Decreased tender points noted in upper traps and levator scap muscles following DN and manual therapy.      OBJECTIVE IMPAIRMENTS decreased activity tolerance, decreased ROM, decreased strength, increased fascial restrictions, increased muscle spasms, and pain.   ACTIVITY LIMITATIONS carrying, lifting, sitting, standing, and caring for others  PARTICIPATION LIMITATIONS: cleaning, driving, and occupation  PERSONAL FACTORS Past/current experiences, Profession, Time since onset of injury/illness/exacerbation, and 1-2 comorbidities: depression,asthma, fibromyalgia  are also affecting patient's functional outcome.   REHAB POTENTIAL: Good  CLINICAL DECISION MAKING: Evolving/moderate complexity  EVALUATION COMPLEXITY: Moderate   GOALS: Goals reviewed with patient? Yes  SHORT TERM GOALS: Target date: 04/12/2022   The patient will demonstrate knowledge of basic self care strategies and exercises to promote healing   Baseline:  Goal status: goal met 9/12  2.  The patient will report  a 30% improvement in pain levels with functional activities which are  currently difficult including home and work tasks Baseline: not able to rate today due to changes with her workload Goal status: ongoing  3.  Improved cervical rotation to 60 degrees bil needed for driving Baseline: 60 Goal status: MET   LONG TERM GOALS: Target date: 07/10/2022  The patient will be independent in a safe self progression of a home exercise program to promote further recovery of function   Baseline:  Goal status: ongoing  2.  The patient will report a 60% improvement in pain levels with functional activities which are currently difficult including work tasks Baseline:  Goal status: ongoing  3.  Improved cervical sidebending ROM to 52 degrees and rotation to 60 degrees needed for driving and work duties Baseline:  Goal status: ongoing  4.  Improved cervical, scapular and glenohumeral strength to grossly 4+/5 needed for home and work ADLs Baseline:  Goal status: ongoing  5.  The patient will have improved FOTO score to  58% indicating improved function with less pain  Baseline:  Goal status: goal met 10/10   PLAN: PT FREQUENCY: 1X/week   PT DURATION: 8 weeks  PLANNED INTERVENTIONS: Therapeutic exercises, Therapeutic activity, Neuromuscular re-education, Patient/Family education, Self Care, Joint mobilization, Aquatic Therapy, Dry Needling, Electrical stimulation, Spinal mobilization, Cryotherapy, Moist heat, Taping, Traction, Ultrasound, Ionotophoresis 39m/ml Dexamethasone, Manual therapy, and Re-evaluation  PLAN FOR NEXT SESSION:  check progress toward goals, FOTO, ROM MMT and determine ERO vs. Discharge;  progressive upper quarter strengthening, encourage thoracic and cervical flexibility, DN as needed   SRuben Im PT 07/06/22 9:21 AM Phone: [phone]: [phone]  [address] [place] [id number] # [phone] [phone]

## 2022-07-10 ENCOUNTER — Ambulatory Visit: Payer: Managed Care, Other (non HMO) | Admitting: Physical Therapy

## 2022-07-10 DIAGNOSIS — R252 Cramp and spasm: Secondary | ICD-10-CM

## 2022-07-10 DIAGNOSIS — M6281 Muscle weakness (generalized): Secondary | ICD-10-CM

## 2022-07-10 DIAGNOSIS — M542 Cervicalgia: Secondary | ICD-10-CM

## 2022-07-10 NOTE — Therapy (Signed)
OUTPATIENT PHYSICAL THERAPY CERVICAL PROGRESS [place] SUMMARY Patient Name: Adrienne Williams MRN: [id number] DOB:September 06, 1980, 41 y.o., female   PT End of Session - 07/10/22 0843     Visit Number 17    Date for PT Re-Evaluation 07/10/22    Authorization Type [employer]    PT Start Time 0845    PT Stop Time 0925    PT Time Calculation (min) 40 min    Activity Tolerance Patient tolerated treatment well                Past Medical History:  Diagnosis Date   Anxiety    Asthma    Depression    Family history of breast cancer    Pilar Plate breech presentation 05/29/2018   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   Hemorrhoids    Hx of varicella    PONV (postoperative nausea and vomiting)    "little nauseated"   Postpartum care following cesarean delivery (6/14) 01/18/2016   Past Surgical History:  Procedure Laterality Date   CESAREAN SECTION N/A 01/18/2016   Procedure: Primary CESAREAN SECTION;  Surgeon: Princess Bruins, MD;  Location: [facility];  Service: Obstetrics;  Laterality: N/A;  EDD: 01/25/16   CESAREAN SECTION N/A 05/29/2018   Procedure: Repeat CESAREAN SECTION;  Surgeon: Azucena Fallen, MD;  Location: [facility];  Service: Obstetrics;  Laterality: N/A;  EDD: 06/10/18   COLONOSCOPY     CYSTECTOMY  2002   pilonidal region   DIAGNOSTIC LAPAROSCOPY WITH REMOVAL OF ECTOPIC PREGNANCY Right 05/29/2020   Procedure: DIAGNOSTIC LAPAROSCOPY WITH REMOVAL OF ECTOPIC PREGNANCY;  Surgeon: Azucena Fallen, MD;  Location: [place] [facility];  Service: Gynecology;  Laterality: Right;   MOLE REMOVAL     UNILATERAL SALPINGECTOMY Right 05/29/2020   Procedure: UNILATERAL SALPINGECTOMY;  Surgeon: Azucena Fallen, MD;  Location: [facility];  Service: Gynecology;  Laterality: Right;   WISDOM TOOTH EXTRACTION     Patient Active Problem List   Diagnosis Date Noted   Neck pain, chronic 06/27/2022   Fibromyalgia 06/27/2022   Myofascial pain 06/27/2022   Internal hemorrhoids  06/13/2022   Rectal bleeding 06/13/2022   Family history of breast cancer 68/03/8109   Monoallelic mutation of CHEK2 gene in female patient 01/18/2022   Anxiety and depression 05/31/2018   Rh negative, maternal 05/31/2018   Pilar Plate breech presentation 05/29/2018   Status post repeat low transverse cesarean section 05/29/2018   Postpartum care following cesarean delivery (10/24) 05/29/2018    PCP: Marda Stalker PA  REFERRING PROVIDER: Angelina Pih MD  REFERRING DIAG: M54.2 chronic neck pain   THERAPY DIAG:  Cervicalgia; spasm; weakness  Rationale for Evaluation and Treatment Rehabilitation  ONSET DATE: "Forever" 08/06/21  SUBJECTIVE:  SUBJECTIVE STATEMENT: 40-60% better overall since start of care.     PERTINENT HISTORY:  Asthma; fibromyalgia; depression (pt feels this is well controlled); restarted Baclofen  Has a kettlebell at home; bands and dumbbells 2# and 5# and 15# at home  PAIN:  Are you having pain? 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 No  LIVING ENVIRONMENT: Lives with: lives with their family Lives in: House/apartment  OCCUPATION: full time pharmacist ACTIVITY:  [place] riding 1x/week, run in the backyard 2x/week;  agility with the dog  PLOF: Independent  PATIENT GOALS for the pain not make me cry.  I cry when I'm by myself .  OBJECTIVE:   DIAGNOSTIC FINDINGS:  none  PATIENT  SURVEYS:  FOTO 42% 10/10: 63%  12/5 65%  COGNITION: Overall cognitive status: Within functional limits for tasks assessed   POSTURE: mild head forward; prominent scalenes and SCM  PALPATION: Cervical joint mobility WNLS;  decreased soft tissue mobility upper traps, suboccipitals; masseters right > left  CERVICAL ROM:   Active ROM A/PROM (deg) eval A/ROM 04/02/22 9/12: 05/08/22 _0 12/5  Flexion 45  42  55   63  Extension 57  62  45   70  Right lateral flexion 48  52 50 38 40 45 47  Left lateral flexion 44  48 50 38 40 45 35  Right rotation 50 59 55 60 48  55 60  Left rotation 57 60 60 60 56  55 70   (Blank rows = not tested)   Decreased thoracic extension and rotation mobility  UPPER EXTREMITY ROM: WFLS , reports right shoulder clicking 03/0: full shoulder ROM no clicking  UPPER EXTREMITY MMT:   Decreased strength cervical extensors and deep cervical flexors, middle and lower traps grossly 4-/5 10/10:  cervical extensors and deep cervical flexors, middle and lower traps grossly 4/5 12/5:  Grossly 4+/5  CERVICAL SPECIAL TESTS:  Distraction test: Negative    TODAY'S TREATMENT:   12/5: UBE 4 min alternating directions Standing lat bar 40# 10x thoracic extension with ball 5x Cervical ROM MMT FOTO Progression/update of HEP   12/1: UBE 4 min Standing lat bar 40# 10x Goblet squat 15# 10x Standing on foam 10# weight pass around 10x each direction  Standing on foam 10# kettlebell snatch and press overhead 10x right/left Seated sit up with 10# weight press overhead 10x Standing 10# kettlebell cleans 10x right/left  30# dead lifts to below knee level 10x Seated matrix row 40#  10x Supine thoracic extension with foam roll 10x Manual therapy:soft tissue mobilization bil cervical paraspinal, upper trap, levator scap Trigger Point Dry-Needling  Treatment instructions: Expect mild to moderate muscle soreness. S/S of pneumothorax if dry needled over a lung  field, and to seek immediate medical attention should they occur. Patient verbalized understanding of these instructions and education Patient Consent Given: Yes Education handout provided: Previously provided Muscles treated: bil cervical multifidi, Bil upper traps; bil levator scap; Elongation after needling to neck and upper traps  Treatment response/outcome: improved soft tissue mobility       11/14: Seated sit up with 10# weight press overhead 10x  Standing 10# kettlebell cleans 10x right/left  Counter push ups 10x Orange Plyo ball chops 10x each side 11# 30# dead lifts to below knee level 10x2 Seated matrix row 40#  10x Quadruped thread the needle using foam roll/open book combo 10x right/left Manual therapy:soft tissue mobilization bil cervical paraspinal, upper trap, bil suboccipital Trigger Point Dry-Needling  Treatment instructions: Expect mild to moderate muscle soreness. S/S of pneumothorax if dry needled over a lung field, and to seek immediate medical attention should they occur. Patient verbalized understanding of these instructions and education Patient Consent Given: Yes Education handout provided: Previously provided Muscles treated: bil cervical multifidi, Bil upper traps, bil suboccipitals Elongation after needling to neck and upper traps  Treatment response/outcome: improved soft tissue mobility    PATIENT EDUCATION:  Education details: anti inflammatory strategies; dry needling after care Person educated: Patient Education method: Explanation Education comprehension: verbalized understanding   HOME EXERCISE PROGRAM: Access Code: [email or web address] URL: [email or web address] Date: 07/10/2022 Prepared by: Ruben Im  Exercises - Neck Mobilization with Foam Roller  - 1 x daily - 7 x weekly - 1 sets - 10 reps - Supine Static Chest Stretch on Foam Roll  - 1 x daily - 7 x weekly - 3 sets - 10 reps - Overhead Reach on Foam Roll  - 1 x daily - 7 x  weekly - 1 sets - 10 reps - Thoracic Mobilization with Hands Behind Head on Foam Roll  - 1 x daily - 7 x weekly - 1 sets - 10 reps - Sidelying Open Book Thoracic Rotation with Knee on Foam Roll  - 1 x daily - 7 x weekly - 1 sets - 10 reps - Supine Deep Neck Flexor Training  - 1 x daily - 7 x weekly - 1 sets - 10 reps - Seated Thoracic Lumbar Extension with Pectoralis Stretch  - 1 x daily - 7 x weekly - 1 sets - 10 reps - Standing Row with Anchored Resistance  - 1 x daily - 7 x weekly - 2 sets - 10 reps - Shoulder extension with resistance - Neutral  - 1 x daily - 7 x weekly - 2 sets - 10 reps - Standing Shoulder Flexion to 90 Degrees with Dumbbells  - 1 x daily - 7 x weekly - 1 sets - 8 reps - Half Deadlift with Kettlebell  - 1 x daily - 7 x weekly - 1 sets - 10 reps - Farmer's Carry with Kettlebells  - 1 x daily - 7 x weekly - 10 reps - Seated Assisted Cervical Rotation with Towel  - 3 x daily - 7 x weekly - 10 reps - 2 hold - Bent Over Single Arm Shoulder Row with Dumbbell  - 1 x daily - 7 x weekly - 2 sets - 10 reps - Single Arm Bent Over Shoulder Extension with Dumbbell  - 1 x daily - 7 x weekly - 2 sets - 10 reps - Shoulder Overhead Press in Flexion with Dumbbells  - 1 x daily - 7 x weekly - 1 sets - 10 reps - Prone Lower Trapezius Strengthening on Swiss Ball  - 1 x daily - 7 x weekly - 1 sets - 10 reps   Added today 04/02/22: Cervical SELF SNAGS  ASSESSMENT:  CLINICAL IMPRESSION: Therapist progressing and updating HEP for increased intensity and challenge level for further strengthening and functional mobility.  The patient has met the majority of rehab goals, with noted improvements in pain reduction, outcome score,  ROM, strength and functional mobility.  A comprehensive HEP has been established and anticipate further improvements over time with regular performance of the program.  Recommend discharge from PT at this time.      OBJECTIVE IMPAIRMENTS decreased activity tolerance,  decreased ROM, decreased strength, increased fascial restrictions, increased muscle spasms, and pain.   ACTIVITY LIMITATIONS carrying, lifting, sitting, standing, and caring for others  PARTICIPATION LIMITATIONS: cleaning, driving, and occupation  PERSONAL FACTORS Past/current experiences, Profession, Time since onset of injury/illness/exacerbation, and 1-2 comorbidities: depression,asthma, fibromyalgia  are also affecting patient's functional outcome.   REHAB POTENTIAL: Good  CLINICAL DECISION MAKING: Evolving/moderate complexity  EVALUATION COMPLEXITY: Moderate   GOALS: Goals reviewed with patient? Yes  SHORT TERM GOALS: Target date: 04/12/2022   The patient will demonstrate knowledge of basic self care strategies and exercises to promote healing   Baseline:  Goal status: goal met 9/12  2.  The patient will report a 30% improvement in pain levels with functional activities which are currently difficult including home and work tasks Baseline: not able to rate today due to changes with her workload Goal status: ongoing  3.  Improved cervical rotation to 60 degrees bil needed for driving Baseline: 60 Goal status: MET   LONG TERM GOALS: Target date: 07/10/2022  The patient will be independent in a safe self progression of a home exercise program to promote further recovery of function   Baseline:  Goal status: goal met   2.  The patient will report a 60% improvement in pain levels with functional activities which are currently difficult including work tasks Baseline:  Goal status: partially met  3.  Improved cervical sidebending ROM to 52 degrees and rotation to 60 degrees needed for driving and work duties Baseline:  Goal status: partially met  4.  Improved cervical, scapular and glenohumeral strength to grossly 4+/5 needed for home and work ADLs Baseline:  Goal status: goal met  5.  The patient will have improved FOTO score to  58% indicating improved function with  less pain  Baseline:  Goal status: goal met 10/10   PLAN: PHYSICAL THERAPY DISCHARGE SUMMARY  Visits from Start of Care: 17  Current functional level related to goals / functional outcomes: See clinical impressions above   Remaining deficits: As above   Education / Equipment: HEP   Patient agrees to discharge. Patient goals were partially met. Patient is being discharged due to meeting the stated rehab goals.    Ruben Im, PT 07/10/22 8:48 AM Phone: [phone] Fax: [phone]   [address], [address] [place], [place] [address] Phone # [phone] Fax [phone]

## 2022-08-14 ENCOUNTER — Encounter: Payer: Managed Care, Other (non HMO) | Admitting: Physical Medicine & Rehabilitation

## 2022-08-17 ENCOUNTER — Encounter: Payer: Self-pay | Admitting: Obstetrics & Gynecology

## 2022-09-25 NOTE — Progress Notes (Signed)
Subjective:    Patient ID: Adrienne Williams, female    DOB: [birth year], 42 y.o.   MRN: [id number]  HPI   Pain Inventory Average Pain {NUMBERS; 0-10:5044} Pain Right Now {NUMBERS; 0-10:5044} My pain is {PAIN DESCRIPTION:21022940}  In the last 24 hours, has pain interfered with the following? General activity {NUMBERS; 0-10:5044} Relation with others {NUMBERS; 0-10:5044} Enjoyment of life {NUMBERS; 0-10:5044} What TIME of day is your pain at its worst? {time of day:24191} Sleep (in general) {BHH GOOD/FAIR/POOR:22877}  Pain is worse with: {ACTIVITIES:21022942} Pain improves with: {PAIN IMPROVES SV:[id number] Relief from Meds: {NUMBERS; 0-10:5044}  Family History  Problem Relation Age of Onset   ADD / ADHD Brother    Breast cancer Maternal Grandmother 41   Cancer Maternal Grandmother        stomach, possible met from breast cancer   Hypothyroidism Paternal Grandmother    Atrial fibrillation Paternal Grandmother    Angina Paternal Grandfather    Hypertension Paternal Grandfather    Hyperlipidemia Paternal Grandfather    Colon polyps Paternal Grandfather    Colon cancer Neg Hx    Esophageal cancer Neg Hx    Social History   Socioeconomic History   Marital status: Married    Spouse name: Not on file   Number of children: 2   Years of education: Not on file   Highest education level: Not on file  Occupational History   Occupation: Pharmacist  Tobacco Use   Smoking status: Never   Smokeless tobacco: Never  Vaping Use   Vaping Use: Never used  Substance and Sexual Activity   Alcohol use: No   Drug use: No   Sexual activity: Yes  Other Topics Concern   Not on file  Social History Narrative   Not on file   Social Determinants of Health   Financial Resource Strain: Low Risk  (05/26/2018)   Overall Financial Resource Strain (CARDIA)    Difficulty of Paying Living Expenses: Not hard at all  Food Insecurity: No Food Insecurity (05/26/2018)   Hunger Vital Sign     Worried About Running Out of Food in the Last Year: Never true    Ran Out of Food in the Last Year: Never true  Transportation Needs: Unknown (05/26/2018)   PRAPARE - Hydrologist (Medical): No    Lack of Transportation (Non-Medical): Not on file  Physical Activity: Not on file  Stress: Stress Concern Present (05/26/2018)   [place]    Feeling of Stress : To some extent  Social Connections: Not on file   Past Surgical History:  Procedure Laterality Date   CESAREAN SECTION N/A 01/18/2016   Procedure: Primary CESAREAN SECTION;  Surgeon: Princess Bruins, MD;  Location: [facility];  Service: Obstetrics;  Laterality: N/A;  EDD: 01/25/16   CESAREAN SECTION N/A 05/29/2018   Procedure: Repeat CESAREAN SECTION;  Surgeon: Azucena Fallen, MD;  Location: [facility];  Service: Obstetrics;  Laterality: N/A;  EDD: 06/10/18   COLONOSCOPY     CYSTECTOMY  2002   pilonidal region   DIAGNOSTIC LAPAROSCOPY WITH REMOVAL OF ECTOPIC PREGNANCY Right 05/29/2020   Procedure: DIAGNOSTIC LAPAROSCOPY WITH REMOVAL OF ECTOPIC PREGNANCY;  Surgeon: Azucena Fallen, MD;  Location: [facility];  Service: Gynecology;  Laterality: Right;   MOLE REMOVAL     UNILATERAL SALPINGECTOMY Right 05/29/2020   Procedure: UNILATERAL SALPINGECTOMY;  Surgeon: Azucena Fallen, MD;  Location: [facility];  Service: Gynecology;  Laterality:  Right;   WISDOM TOOTH EXTRACTION     Past Surgical History:  Procedure Laterality Date   CESAREAN SECTION N/A 01/18/2016   Procedure: Primary CESAREAN SECTION;  Surgeon: Princess Bruins, MD;  Location: [facility];  Service: Obstetrics;  Laterality: N/A;  EDD: 01/25/16   CESAREAN SECTION N/A 05/29/2018   Procedure: Repeat CESAREAN SECTION;  Surgeon: Azucena Fallen, MD;  Location: [facility];  Service: Obstetrics;  Laterality: N/A;  EDD: 06/10/18   COLONOSCOPY     CYSTECTOMY  2002    pilonidal region   DIAGNOSTIC LAPAROSCOPY WITH REMOVAL OF ECTOPIC PREGNANCY Right 05/29/2020   Procedure: DIAGNOSTIC LAPAROSCOPY WITH REMOVAL OF ECTOPIC PREGNANCY;  Surgeon: Azucena Fallen, MD;  Location: [facility];  Service: Gynecology;  Laterality: Right;   MOLE REMOVAL     UNILATERAL SALPINGECTOMY Right 05/29/2020   Procedure: UNILATERAL SALPINGECTOMY;  Surgeon: Azucena Fallen, MD;  Location: [facility];  Service: Gynecology;  Laterality: Right;   WISDOM TOOTH EXTRACTION     Past Medical History:  Diagnosis Date   Anxiety    Asthma    Depression    Family history of breast cancer    Frank breech presentation 05/29/2018   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  Opioid Risk Score:   Fall Risk Score:  `1  Depression screen [facility] 2/9     06/27/2022    9:20 AM  Depression screen PHQ 2/9  Decreased Interest 2  Down, Depressed, Hopeless 3  PHQ - 2 Score 5  Altered sleeping 2  Tired, decreased energy 1  Change in appetite 1  Feeling bad or failure about yourself  1  Trouble concentrating 1  Moving slowly or fidgety/restless 0  Suicidal thoughts 0  PHQ-9 Score 11  Difficult doing work/chores Somewhat difficult    Review of Systems     Objective:   Physical Exam        Assessment & Plan:

## 2022-09-26 ENCOUNTER — Encounter
Payer: Managed Care, Other (non HMO) | Attending: Physical Medicine and Rehabilitation | Admitting: Physical Medicine and Rehabilitation

## 2022-09-26 ENCOUNTER — Encounter: Payer: Self-pay | Admitting: Physical Medicine and Rehabilitation

## 2022-09-26 VITALS — BP 110/74 | HR 76 | Ht 63.0 in | Wt 128.0 lb

## 2022-09-26 DIAGNOSIS — F419 Anxiety disorder, unspecified: Secondary | ICD-10-CM

## 2022-09-26 DIAGNOSIS — F32A Depression, unspecified: Secondary | ICD-10-CM

## 2022-09-26 DIAGNOSIS — M542 Cervicalgia: Secondary | ICD-10-CM | POA: Insufficient documentation

## 2022-09-26 DIAGNOSIS — M7918 Myalgia, other site: Secondary | ICD-10-CM | POA: Insufficient documentation

## 2022-09-26 DIAGNOSIS — G8929 Other chronic pain: Secondary | ICD-10-CM | POA: Diagnosis present

## 2022-09-26 MED ORDER — DULOXETINE HCL 30 MG PO CPEP: 60.0000 mg | ORAL_CAPSULE | Freq: Two times a day (BID) | ORAL | 1 refills | Status: DC

## 2022-09-26 MED ORDER — BACLOFEN 10 MG PO TABS: 10.0000 mg | ORAL_TABLET | Freq: Three times a day (TID) | ORAL | 3 refills | Status: DC | PRN

## 2022-09-26 NOTE — Assessment & Plan Note (Signed)
I have refilled your Baclofen and prescribed Duloxetine at increase dose for 90 days R1.   For duloxetine, Take 2 capsules (60 mg total) by mouth 2 (two) times daily. Start with 2 capsules in the morning and one capsule at nighttime for 2 weeks. If tolerating well, increase to 2 capsules twice daily. If any concerns regarding medication side effects, call your doctor's office and do not stop the medication without tapering

## 2022-09-26 NOTE — Assessment & Plan Note (Signed)
Well controlled on current regimen, no current symptoms concerning for serotonin syndrome but will need to monitor for this on multiple antidepressants

## 2022-09-26 NOTE — Patient Instructions (Addendum)
I have refilled your Baclofen and prescribed Duloxetine at increase dose for 90 days R1.   For duloxetine, Take 2 capsules (60 mg total) by mouth 2 (two) times daily. Start with 2 capsules in the morning and one capsule at nighttime for 2 weeks. If tolerating well, increase to 2 capsules twice daily. If any concerns regarding medication side effects, call your doctor's office and do not stop the medication without tapering   Continue physical activity as tolerated and adjunctive treatments like massage with theracane and accupunture.  I will see you in 2 weeks for trigger point injecitons and 3 months for a general follow up

## 2022-09-26 NOTE — Assessment & Plan Note (Signed)
Continue physical activity as tolerated and adjunctive treatments like massage with theracane and accupunture.  I will see you in 2 weeks for trigger point injecitons and 3 months for a general follow up

## 2022-09-27 ENCOUNTER — Telehealth: Payer: Self-pay | Admitting: *Deleted

## 2022-09-27 NOTE — Telephone Encounter (Signed)
Patient left a message about her Duloxetine 30 mg 3 tabs tid. She says that her insurance will only cover this for 30 days.. She wants to know if the next prescription could be changed to 60 mg tabs pt contact #[phone].

## 2022-09-28 ENCOUNTER — Telehealth: Payer: Self-pay

## 2022-09-28 MED ORDER — DULOXETINE HCL 60 MG PO CPEP: ORAL_CAPSULE | ORAL | 3 refills | Status: DC

## 2022-09-28 MED ORDER — DULOXETINE HCL 30 MG PO CPEP: ORAL_CAPSULE | ORAL | 0 refills | Status: DC

## 2022-09-28 NOTE — Addendum Note (Signed)
Addended by: Durel Salts on: 09/28/2022 04:07 PM   Modules accepted: Orders

## 2022-09-28 NOTE — Telephone Encounter (Signed)
Fax from pharmacy states insurance will not pay for 4 pills per day of 30 mg. You can change prescription to one BID of 60 mg x 90 days

## 2022-10-30 ENCOUNTER — Encounter: Payer: Managed Care, Other (non HMO) | Admitting: Obstetrics and Gynecology

## 2022-10-30 ENCOUNTER — Encounter: Payer: Managed Care, Other (non HMO) | Admitting: Obstetrics & Gynecology

## 2022-10-31 ENCOUNTER — Telehealth: Payer: Self-pay | Admitting: Physical Medicine and Rehabilitation

## 2022-10-31 NOTE — Telephone Encounter (Signed)
Patient would like to know if her baclofen can be increased. She says it is helping but thinks it could be better. Please call patient.

## 2022-11-02 ENCOUNTER — Encounter: Payer: Self-pay | Admitting: Physical Medicine and Rehabilitation

## 2022-11-07 ENCOUNTER — Encounter
Payer: Managed Care, Other (non HMO) | Attending: Physical Medicine and Rehabilitation | Admitting: Physical Medicine and Rehabilitation

## 2022-11-07 VITALS — BP 121/79 | HR 76 | Ht 63.0 in | Wt 130.2 lb

## 2022-11-07 DIAGNOSIS — M7918 Myalgia, other site: Secondary | ICD-10-CM | POA: Diagnosis not present

## 2022-11-07 DIAGNOSIS — F32A Depression, unspecified: Secondary | ICD-10-CM | POA: Insufficient documentation

## 2022-11-07 DIAGNOSIS — M542 Cervicalgia: Secondary | ICD-10-CM | POA: Diagnosis not present

## 2022-11-07 DIAGNOSIS — F419 Anxiety disorder, unspecified: Secondary | ICD-10-CM | POA: Diagnosis not present

## 2022-11-07 DIAGNOSIS — M797 Fibromyalgia: Secondary | ICD-10-CM

## 2022-11-07 DIAGNOSIS — G8929 Other chronic pain: Secondary | ICD-10-CM | POA: Insufficient documentation

## 2022-11-07 MED ORDER — TRIAMCINOLONE ACETONIDE 40 MG/ML IJ SUSP
20.0000 mg | Freq: Once | INTRAMUSCULAR | Status: AC
  Administered 2022-11-07: 20 mg via INTRAMUSCULAR

## 2022-11-07 MED ORDER — LIDOCAINE HCL 1 % IJ SOLN
3.0000 mL | Freq: Once | INTRAMUSCULAR | Status: AC
  Administered 2022-11-07: 6 mL
  Administered 2022-11-07: 3 mL

## 2022-11-07 NOTE — Progress Notes (Signed)
HPI: Adrienne Williams is a 42 y.o. female with PMHx has Homero Fellers breech presentation; Status post repeat low transverse cesarean section; Postpartum care following cesarean delivery (10/24); Anxiety and depression; Rh negative, maternal; Family history of breast cancer; Monoallelic mutation of CHEK2 gene in female patient; Internal hemorrhoids; Rectal bleeding; Neck pain, chronic; Fibromyalgia; and Myofascial pain on their problem list. who presents to clinic for treatment of pain related to myofascial pain  via injection as described below.    No new concerns or complaints. No major changes in medical history since last visit.   Patient states her mother has been a missing person since she was a year old. She states recently a reported reached out to do a story on her mom, and brought everything back up. She is sleeping ok and has been seeing her psychiatrist and psychologist frequently.    Re: baclofen, with the stress she feels like there is a "vice" around her neck and shoulders; it is unremitting with current activities.   Doesing HIT training, running and sprinting for time in the yard up to 20-30 minutes. Also running with her dogs and kids.   Physical Exam:  General: Appropriate appearance for age.  Mental Status: Appropriate mood and affect.  Cardiovascular: RRR, no m/r/g.  Respiratory: CTAB, no rales/rhonchi/wheezing.  Skin: No apparent rashes or lesions.  Neuro: Awake, alert, and oriented x3. No apparent deficits.  MSK:  Moving al 4 limbs antigravity and against resistance.  + TTP bilateral traps, n/l levator scapulae, B/Lcervical and R thoracic paraspinals + palpable lymph node R scalenes  PROCEDURE:  Bilateral  trigger point injections Diagnosis:    ICD-10-CM   1. Myofascial pain  M79.18 lidocaine (XYLOCAINE) 1 % (with pres) injection 3 mL      Goals with treatment: [ X ] Decrease pain [  ] Improve Active / Passive ROM [ x ] Improve ADLs [  ] Improve functional  mobility  MEDICATION:  [ x ] Kenalog 40 mg/mL  [ X ] Lidocaine 1%    CONSENT: Obtained in writing per policy. Consent uploaded to chart.  Benefits discussed.  Risks discussed included, but were not limited to, pain and discomfort, bleeding, bruising, allergic reaction, infection. All questions answered to patient/family member/guardian/ caregiver satisfaction. They would like to proceed with procedure. There are no noted contraindications to procedure.  PROCEDURE Time out was preformed No heat sources No antibiotics  The patient was explained about both the benefits and risks of a Bilateral  trigger point injections. After the patient acknowledged an understanding of the risks and benefits, the patient agreed to proceed. The area was first marked and then prepped in an aseptic fashion with betadine / alcohol. A 30 g, 0.5 inch needle was directed via a direct approach into the bilateral traps, bilateral levator scapulae, B/Lcervical and R thoracic paraspinals. The injection was completed with Kenalog 40 mg/ml 0.2 cc mixed with 6 cc of 1% lidocaine after no blood was aspirated on pull back.  No complications were encountered. The patient tolerated the procedure well.  Impression: HPI: Adrienne Williams is a 42 y.o. female with PMHx has Homero Fellers breech presentation; Status post repeat low transverse cesarean section; Postpartum care following cesarean delivery (10/24); Anxiety and depression; Rh negative, maternal; Family history of breast cancer; Monoallelic mutation of CHEK2 gene in female patient; Internal hemorrhoids; Rectal bleeding; Neck pain, chronic; Fibromyalgia; and Myofascial pain on their problem list. who presents to clinic for treatment of myofascial pain . They received a  Bilateral  trigger point injections as above.   PLAN: - Resume Usual Activities. Notify Physician of any unusual bleeding, erythema or concern for side effects as reviewed above. - Apply ice prn for pain -  Tylenol prn for pain  - Conitnue exercise and HIT training as tolerated! - Use voltaren gel to your neck and shoulders up to 4 times daily for tightness and pain - Follow up as scheduled in 1-2 months  Patient/Care Giver was ready to learn without apparent learning barriers. Education was provided on diagnosis, treatment options/plan according to patient's preferred learning style. Patient/Care Giver verbalized understanding and agreement with the above plan.   Angelina Sheriff, DO 11/07/2022

## 2022-11-07 NOTE — Patient Instructions (Signed)
-   Resume Usual Activities. Notify Physician of any unusual bleeding, erythema or concern for side effects as reviewed above. - Apply ice prn for pain - Tylenol prn for pain  - Conitnue exercise and HIT training as tolerated! - Use voltaren gel to your neck and shoulders up to 4 times daily for tightness and pain

## 2022-12-18 NOTE — Therapy (Signed)
OUTPATIENT PHYSICAL THERAPY FEMALE PELVIC EVALUATION   Patient Name: Adrienne Williams MRN: [id number] DOB:1981-01-18, 42 y.o., female Today's Date: 12/19/2022  END OF SESSION:  PT End of Session - 12/19/22 0927     Visit Number 1    Date for PT Re-Evaluation 03/13/23    Authorization Type cigna    PT Start Time [phone]    PT Stop Time 1010    PT Time Calculation (min) 43 min    Activity Tolerance Patient tolerated treatment well    Behavior During Therapy [facility] for tasks assessed/performed             Past Medical History:  Diagnosis Date   Anxiety    Asthma    Depression    Family history of breast cancer    Homero Fellers breech presentation 05/29/2018   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   Hemorrhoids    Hx of varicella    PONV (postoperative nausea and vomiting)    "little nauseated"   Postpartum care following cesarean delivery (6/14) 01/18/2016   Past Surgical History:  Procedure Laterality Date   CESAREAN SECTION N/A 01/18/2016   Procedure: Primary CESAREAN SECTION;  Surgeon: Genia Del, MD;  Location: [facility];  Service: Obstetrics;  Laterality: N/A;  EDD: 01/25/16   CESAREAN SECTION N/A 05/29/2018   Procedure: Repeat CESAREAN SECTION;  Surgeon: Shea Evans, MD;  Location: [facility];  Service: Obstetrics;  Laterality: N/A;  EDD: 06/10/18   COLONOSCOPY     CYSTECTOMY  2002   pilonidal region   DIAGNOSTIC LAPAROSCOPY WITH REMOVAL OF ECTOPIC PREGNANCY Right 05/29/2020   Procedure: DIAGNOSTIC LAPAROSCOPY WITH REMOVAL OF ECTOPIC PREGNANCY;  Surgeon: Shea Evans, MD;  Location: [facility] OR;  Service: Gynecology;  Laterality: Right;   MOLE REMOVAL     UNILATERAL SALPINGECTOMY Right 05/29/2020   Procedure: UNILATERAL SALPINGECTOMY;  Surgeon: Shea Evans, MD;  Location: [facility] OR;  Service: Gynecology;  Laterality: Right;   WISDOM TOOTH EXTRACTION     Patient Active Problem List   Diagnosis Date Noted   Neck pain, chronic  06/27/2022   Fibromyalgia 06/27/2022   Myofascial pain 06/27/2022   Internal hemorrhoids 06/13/2022   Rectal bleeding 06/13/2022   Family history of breast cancer 01/18/2022   Monoallelic mutation of CHEK2 gene in female patient 01/18/2022   Anxiety and depression 05/31/2018   Rh negative, maternal 05/31/2018   Homero Fellers breech presentation 05/29/2018   Status post repeat low transverse cesarean section 05/29/2018   Postpartum care following cesarean delivery (10/24) 05/29/2018    PCP: Jarrett Soho, PA-C   REFERRING PROVIDER: Vick Frees, MD   REFERRING DIAG: N39.3 (ICD-10-CM) - Stress incontinence (female)   THERAPY DIAG:  Muscle weakness (generalized)  Other muscle spasm  Unspecified lack of coordination  Rationale for Evaluation and Treatment: Rehabilitation  ONSET DATE: since c-sections 5 years ago, but worse now  SUBJECTIVE:  SUBJECTIVE STATEMENT: Pt is a runner and was doing more sprinting and it caused leakage.  Pt was coughing during a cold and that also caused leakage.  Pt is also gardening a lot and cleaning.  I had PT for neck and shoulder pain that is managed now Fluid intake: Yes: a lot water    PAIN:  Are you having pain? No  PRECAUTIONS: None  WEIGHT BEARING RESTRICTIONS: No  FALLS:  Has patient fallen in last 6 months? No  LIVING ENVIRONMENT: Lives with: lives with their spouse and 2 kids Lives in: House/apartment   OCCUPATION: full time pharmacist retail  PLOF: Independent  PATIENT GOALS: be able to run and have less leakage  PERTINENT HISTORY:  C-section 2017, 2019, ectopic removal 2021, hemorrhoids Sexual abuse: No  BOWEL MOVEMENT: Pain with bowel movement: No Type of bowel movement:Strain No   URINATION: Pain with urination: No Fully empty  bladder: Yes: maybe but not sure Stream: Strong Urgency: No (sometimes at work I wait a long) Frequency: normal for being on a diuretic Leakage: Coughing and Exercise Pads: Yes: just when I know I will be sprinting  INTERCOURSE: Pain with intercourse:  No   PREGNANCY:  C-section deliveries 2 Currently pregnant No  PROLAPSE:    OBJECTIVE:    PATIENT SURVEYS:    PFIQ-7   COGNITION: Overall cognitive status: Within functional limits for tasks assessed     SENSATION: Light touch:  Proprioception:   MUSCLE LENGTH: Hamstrings: WFL  Thomas test:  LUMBAR SPECIAL TESTS:  ASLR lumbar support (feels less tight)  FUNCTIONAL TESTS:  Single leg normal  GAIT:  Comments: WFL   POSTURE: increased lumbar lordosis and anterior pelvic tilt  PELVIC ALIGNMENT:   LUMBARAROM/PROM:  A/PROM A/PROM  eval  Flexion 75%  Extension   Right lateral flexion   Left lateral flexion   Right rotation   Left rotation    (Blank rows = not tested)  LOWER EXTREMITY ROM: hip rotation 75%   LOWER EXTREMITY MMT:  MMT Right eval Left eval  Hip flexion 5 4+  Hip extension 5 5  Hip abduction 5 4+  Hip adduction 5 4+  Hip internal rotation 5 5  Hip external rotation 5 5  Knee flexion    Knee extension    Ankle dorsiflexion    Ankle plantarflexion    Ankle inversion    Ankle eversion     PALPATION:   General  lumbar and gluteals tight, abdomen feels bloated, some deep fascial restriction around c-section incision                External Perineal Exam ischiocavernosis tight on left                             Internal Pelvic Floor tight and high tone, slow relaxation time  Patient confirms identification and approves PT to assess internal pelvic floor and treatment Yes  PELVIC MMT:   MMT eval  Vaginal 3/5 x 5 reps slow, 5 sec hold  Internal Anal Sphincter   External Anal Sphincter   Puborectalis   Diastasis Recti Above umbilicus to xyphoid one finger to knuckle deep   (Blank rows = not tested)        TONE: high  PROLAPSE: no  TODAY'S TREATMENT:  DATE: 12/19/22  EVAL  and initial HEP, discussed dry needling and follow up visits   PATIENT EDUCATION:  Education details: Access Code: [email or web address] Person educated: Patient Education method: Explanation, Demonstration, Tactile cues, Verbal cues, and Handouts Education comprehension: verbalized understanding and returned demonstration  HOME EXERCISE PROGRAM: Access Code: [email or web address] URL: [email or web address] Date: 12/19/2022 Prepared by: Dwana Curd  Exercises - Cat Cow to Child's Pose  - 1 x daily - 7 x weekly - 1 sets - 5 reps - 10 sec hold - Quadruped Circle Weight Shifts  - 1 x daily - 7 x weekly - 3 sets - 10 reps  ASSESSMENT:  CLINICAL IMPRESSION: Patient is a 42 y.o. female who was seen today for physical therapy evaluation and treatment for SUI. Pt has tension throughout pelvic floor and slow ability to relax.  Pt has tension throughout posterior kinetic chain.  Some abdominal weakness noted with ASLR and diastasis of rectus abdominus present.  Pt will benefit from skilled PT to address all impairments for full return to functional activities without leakage  OBJECTIVE IMPAIRMENTS: decreased coordination, decreased endurance, decreased ROM, decreased strength, increased muscle spasms, impaired flexibility, impaired tone, and postural dysfunction.   ACTIVITY LIMITATIONS: continence and toileting  PARTICIPATION LIMITATIONS: community activity and exercise, coughing, sneezing  PERSONAL FACTORS: Time since onset of injury/illness/exacerbation and 1-2 comorbidities: 2 c-section, surgery for ectopic pregnancy  are also affecting patient's functional outcome.   REHAB POTENTIAL: Excellent  CLINICAL DECISION MAKING: Stable/uncomplicated  EVALUATION  COMPLEXITY: Low   GOALS: Goals reviewed with patient? Yes  SHORT TERM GOALS: Target date: 01/16/23  Ind with diaphragmatic breathing Baseline: Goal status: INITIAL  2.  Pt will notice less leakage in general when working out Baseline:  Goal status: INITIAL   LONG TERM GOALS: Target date: 03/13/23  Pt will be independent with advanced HEP to maintain improvements made throughout therapy  Baseline:  Goal status: INITIAL  2.  Pt will be able to functional actions such as coughing for longer period of time as needed without leakage due to improved quick flick and pelvic floor muscle endurance Baseline:  Goal status: INITIAL  3.  Pt will be able to run/workout for 30 minutes without leakage or discomfort  Baseline:  Goal status: INITIAL  4.  Pt be able to do fast run for 1 min at a time without leakage Baseline:  Goal status: INITIAL    PLAN:  PT FREQUENCY: 1x/week  PT DURATION: 12 weeks  PLANNED INTERVENTIONS: Therapeutic exercises, Therapeutic activity, Neuromuscular re-education, Balance training, Gait training, Patient/Family education, Self Care, Joint mobilization, Dry Needling, Electrical stimulation, Spinal manipulation, Spinal mobilization, Cryotherapy, Moist heat, Taping, Traction, Biofeedback, Manual therapy, and Re-evaluation  PLAN FOR NEXT SESSION: abdominal fascial release and lymph massage for bloating, dry needling lumbar and gluteals, breathing and down training pelvic floor and transversus abdominus activation, maybe RUSI in 2 visits for transversus abdominus activation   Jakki L Freya Zobrist, PT 12/19/2022, 11:02 AM

## 2022-12-19 ENCOUNTER — Other Ambulatory Visit: Payer: Self-pay

## 2022-12-19 ENCOUNTER — Ambulatory Visit: Payer: Managed Care, Other (non HMO) | Attending: Obstetrics and Gynecology | Admitting: Physical Therapy

## 2022-12-19 ENCOUNTER — Encounter: Payer: Self-pay | Admitting: Physical Therapy

## 2022-12-19 DIAGNOSIS — M6281 Muscle weakness (generalized): Secondary | ICD-10-CM | POA: Diagnosis present

## 2022-12-19 DIAGNOSIS — M62838 Other muscle spasm: Secondary | ICD-10-CM | POA: Diagnosis present

## 2022-12-19 DIAGNOSIS — R279 Unspecified lack of coordination: Secondary | ICD-10-CM | POA: Diagnosis present

## 2023-01-01 ENCOUNTER — Encounter: Payer: Managed Care, Other (non HMO) | Admitting: Obstetrics and Gynecology

## 2023-01-02 ENCOUNTER — Encounter
Payer: Managed Care, Other (non HMO) | Attending: Physical Medicine and Rehabilitation | Admitting: Physical Medicine and Rehabilitation

## 2023-01-02 ENCOUNTER — Encounter: Payer: Self-pay | Admitting: Plastic Surgery

## 2023-01-02 ENCOUNTER — Ambulatory Visit: Payer: Managed Care, Other (non HMO) | Admitting: Plastic Surgery

## 2023-01-02 VITALS — BP 114/75 | HR 85 | Ht 63.0 in | Wt 129.4 lb

## 2023-01-02 VITALS — BP 130/83 | HR 93 | Ht 63.0 in | Wt 128.6 lb

## 2023-01-02 DIAGNOSIS — M7918 Myalgia, other site: Secondary | ICD-10-CM | POA: Diagnosis present

## 2023-01-02 DIAGNOSIS — L989 Disorder of the skin and subcutaneous tissue, unspecified: Secondary | ICD-10-CM

## 2023-01-02 MED ORDER — DICLOFENAC SODIUM 1 % EX GEL: 4.0000 g | Freq: Four times a day (QID) | CUTANEOUS | 2 refills | Status: DC | PRN

## 2023-01-02 MED ORDER — LIDOCAINE HCL 1 % IJ SOLN
3.0000 mL | Freq: Once | INTRAMUSCULAR | Status: AC
Start: 2023-01-02 — End: 2023-01-02
  Administered 2023-01-02: 3 mL

## 2023-01-02 NOTE — Progress Notes (Signed)
HPI: JADALEE DANNER is a 42 y.o. female with PMHx has Homero Fellers breech presentation; Status post repeat low transverse cesarean section; Postpartum care following cesarean delivery (10/24); Anxiety and depression; Rh negative, maternal; Family history of breast cancer; Monoallelic mutation of CHEK2 gene in female patient; Internal hemorrhoids; Rectal bleeding; Neck pain, chronic; Fibromyalgia; and Myofascial pain on their problem list. who presents to clinic for treatment of pain related to myofascial pain  via injection as described below.     No new concerns or complaints. No major changes in medical history since last visit. She overdid it yesterday gardening for about 2 hours, and is in more pain today. She is making an effort to also keep the house more clean which is more difficult with her young children. Mood-wise, she is doing much better.  Diclofenac gel over the counter has been helpful, but expensive. She states she has looked into it with insurance and it is cheaper, would like a script today.   TPI last time lasted worked for about 4 weeks. She would like to get them more frequently.     Physical Exam:  General: Appropriate appearance for age.  Mental Status: Appropriate mood and affect.  Cardiovascular: RRR, no m/r/g.  Respiratory: CTAB, no rales/rhonchi/wheezing.  Skin: No apparent rashes or lesions.  Neuro: Awake, alert, and oriented x3. No apparent deficits.  MSK:  Moving al 4 limbs antigravity and against resistance.  + TTP bilateral traps, n/l levator scapulae, B/Lcervical and R thoracic paraspinals   PROCEDURE:  Bilateral  trigger point injections Diagnosis:      ICD-10-CM    1. Myofascial pain  M79.18 lidocaine (XYLOCAINE) 1 % (with pres) injection 3 mL       Goals with treatment: [ X ] Decrease pain [  ] Improve Active / Passive ROM [ x ] Improve ADLs [  ] Improve functional mobility   MEDICATION:  [ x ] Kenalog 40 mg/mL  [ X ] Lidocaine 1%      CONSENT:  Obtained in writing per policy. Consent uploaded to chart.   Benefits discussed.  Risks discussed included, but were not limited to, pain and discomfort, bleeding, bruising, allergic reaction, infection. All questions answered to patient/family member/guardian/ caregiver satisfaction. They would like to proceed with procedure. There are no noted contraindications to procedure.   PROCEDURE Time out was preformed No heat sources No antibiotics   The patient was explained about both the benefits and risks of a Bilateral  trigger point injections. After the patient acknowledged an understanding of the risks and benefits, the patient agreed to proceed. The area was first marked and then prepped in an aseptic fashion with betadine / alcohol. A 30 g, 0.5 inch needle was directed via a direct approach into the bilateral traps, bilateral levator scapulae, B/Lcervical and R thoracic paraspinals. The injection was completed with Kenalog 40 mg/ml 0.2 cc mixed with 6 cc of 1% lidocaine after no blood was aspirated on pull back.   No complications were encountered. The patient tolerated the procedure well.   Impression: HPI: IFEOMA MIDURA is a 42 y.o. female with PMHx has Homero Fellers breech presentation; Status post repeat low transverse cesarean section; Postpartum care following cesarean delivery (10/24); Anxiety and depression; Rh negative, maternal; Family history of breast cancer; Monoallelic mutation of CHEK2 gene in female patient; Internal hemorrhoids; Rectal bleeding; Neck pain, chronic; Fibromyalgia; and Myofascial pain on their problem list. who presents to clinic for treatment of myofascial pain . They received a  Bilateral  trigger point injections as above.    PLAN: - Resume Usual Activities. Notify Physician of any unusual bleeding, erythema or concern for side effects as reviewed above. - Apply ice prn for pain - Tylenol prn for pain - Conitnue exercise and HIT training as tolerated - Follow up  as scheduled in 2 months, or as frequently as every 1 month if feasible financially and beneficial; would discuss with your insurer before scheudling - I have sent a script for your voltaren gel   Patient/Care Giver was ready to learn without apparent learning barriers. Education was provided on diagnosis, treatment options/plan according to patient's preferred learning style. Patient/Care Giver verbalized understanding and agreement with the above plan.

## 2023-01-02 NOTE — Progress Notes (Signed)
Referring Provider Jarrett Soho, PA-C [address] [place],  [place] [address]   CC:  Chief Complaint  Patient presents with   Advice Only      Adrienne Williams is an 42 y.o. female.  HPI: Adrienne Williams is a 42 year old female who presents today for evaluation of a cyst at the right mandibular margin and to discuss therapy for acne scars.  She states that the cyst has been present for quite some time though she is not sure exactly how long.  She does have a history of acne and has some very mild scars resulting from her  Allergies  Allergen Reactions   Benadryl [Diphenhydramine] Other (See Comments)    Makes skin crawl - pt prefers not to take    Cat Hair Extract Other (See Comments)   Dog Epithelium (Canis Lupus Familiaris) Other (See Comments)   Dust Mite Extract Other (See Comments)   Molds & Smuts Other (See Comments)   Other Other (See Comments)   Prednisone     Pt can not sleep - prefers not to take    Outpatient Encounter Medications as of 01/02/2023  Medication Sig   albuterol (VENTOLIN HFA) 108 (90 Base) MCG/ACT inhaler Inhale into the lungs.   Azelastine HCl 137 MCG/SPRAY SOLN Place into both nostrils.   baclofen (LIORESAL) 10 MG tablet Take 1 tablet (10 mg total) by mouth 3 (three) times daily as needed for muscle spasms.   buPROPion (WELLBUTRIN XL) 300 MG 24 hr tablet Take 300 mg by mouth daily.   cetirizine (ZYRTEC) 10 MG tablet Take by mouth.   diclofenac Sodium (VOLTAREN) 1 % GEL Apply 4 g topically 4 (four) times daily as needed (for pain).   DULoxetine (CYMBALTA) 60 MG capsule Start with one 60 mg capsule in the morning and one 30 mg capsule at nighttime for 2 weeks. If tolerating well, increase to one 60 mg capsule twice daily. If any concerns regarding medication side effects, call your doctor's office and do not stop the medication without tapering.   EPINEPHrine 0.3 mg/0.3 mL IJ SOAJ injection SMARTSIG:0.3 Milligram(s) IM Once PRN   fluticasone  (FLONASE) 50 MCG/ACT nasal spray SPRAY TWO SPRAYS IN THE AFFECTED NOSTRIL DAILY   fluticasone furoate-vilanterol (BREO ELLIPTA) 200-25 MCG/ACT AEPB Inhale 1 puff into the lungs daily.   ibuprofen (ADVIL) 800 MG tablet    lamoTRIgine (LAMICTAL) 100 MG tablet Take 100 mg by mouth 2 (two) times daily.   lisdexamfetamine (VYVANSE) 50 MG capsule Take 50 mg by mouth daily.   LORazepam (ATIVAN) 1 MG tablet Take by mouth.   magnesium oxide (MAG-OX) 400 (241.3 Mg) MG tablet Take 1 tablet (400 mg total) by mouth daily. (Patient taking differently: Take 800 mg by mouth daily.)   montelukast (SINGULAIR) 10 MG tablet Take 10 mg by mouth at bedtime.   NON FORMULARY Allergy shots once every 2 weeks   Omega-3 Fatty Acids (FISH OIL) 1000 MG CAPS Take 1 capsule by mouth daily.   polyethylene glycol powder (GLYCOLAX/MIRALAX) 17 GM/SCOOP powder Take 1 Container by mouth once. Every other day   spironolactone (ALDACTONE) 50 MG tablet 2 tablets every day by oral route.   tretinoin (RETIN-A) 0.05 % cream Apply 1 Application topically at bedtime.   zolpidem (AMBIEN) 10 MG tablet 1 tablet every day by oral route.   [DISCONTINUED] ADVAIR DISKUS 100-50 MCG/ACT AEPB Inhale 1 puff into the lungs 2 (two) times daily. (Patient not taking: Reported on 12/19/2022)   [DISCONTINUED] ALPRAZolam (  XANAX) 0.25 MG tablet    [DISCONTINUED] busPIRone (BUSPAR) 30 MG tablet Take 30 mg by mouth 2 (two) times daily. (Patient not taking: Reported on 01/02/2023)   [DISCONTINUED] DULoxetine (CYMBALTA) 30 MG capsule Start with one 60 mg capsule in the morning and one 30 mg capsule at nighttime for 2 weeks. If tolerating well, increase to one 60 mg capsule twice daily.   If any concerns regarding medication side effects, call your doctor's office and do not stop the medication without tapering.   [DISCONTINUED] Fluticasone-Salmeterol (ADVAIR DISKUS IN)    [DISCONTINUED] hydrocortisone (ANUSOL-HC) 25 MG suppository Insert one supp into rectum daily  at bedtime as needed (Patient not taking: Reported on 12/19/2022)   [DISCONTINUED] lamoTRIgine (LAMICTAL) 150 MG tablet    [DISCONTINUED] VRAYLAR 1.5 MG capsule Take 1.5 mg by mouth daily. (Patient not taking: Reported on 12/19/2022)   No facility-administered encounter medications on file as of 01/02/2023.     Past Medical History:  Diagnosis Date   Anxiety    Asthma    Depression    Family history of breast cancer    Frank breech presentation 05/29/2018   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   Hemorrhoids    Hx of varicella    PONV (postoperative nausea and vomiting)    "little nauseated"   Postpartum care following cesarean delivery (6/14) 01/18/2016    Past Surgical History:  Procedure Laterality Date   CESAREAN SECTION N/A 01/18/2016   Procedure: Primary CESAREAN SECTION;  Surgeon: Genia Del, MD;  Location: [facility];  Service: Obstetrics;  Laterality: N/A;  EDD: 01/25/16   CESAREAN SECTION N/A 05/29/2018   Procedure: Repeat CESAREAN SECTION;  Surgeon: Shea Evans, MD;  Location: [facility] SUIT[facility];  Service: Obstetrics;  Laterality: N/A;  EDD: 06/10/18   COLONOSCOPY     CYSTECTOMY  2002   pilonidal region   DIAGNOSTIC LAPAROSCOPY WITH REMOVAL OF ECTOPIC PREGNANCY Right 05/29/2020   Procedure: DIAGNOSTIC LAPAROSCOPY WITH REMOVAL OF ECTOPIC PREGNANCY;  Surgeon: Shea Evans, MD;  Location: [facility] OR;  Service: Gynecology;  Laterality: Right;   MOLE REMOVAL     UNILATERAL SALPINGECTOMY Right 05/29/2020   Procedure: UNILATERAL SALPINGECTOMY;  Surgeon: Shea Evans, MD;  Location: [facility] OR;  Service: Gynecology;  Laterality: Right;   WISDOM TOOTH EXTRACTION      Family History  Problem Relation Age of Onset   ADD / ADHD Brother    Breast cancer Maternal Grandmother 26   Cancer Maternal Grandmother        stomach, possible met from breast cancer   Hypothyroidism Paternal Grandmother    Atrial fibrillation Paternal Grandmother    Angina  Paternal Grandfather    Hypertension Paternal Grandfather    Hyperlipidemia Paternal Grandfather    Colon polyps Paternal Grandfather    Colon cancer Neg Hx    Esophageal cancer Neg Hx     Social History   Social History Narrative   Not on file     Review of Systems General: Denies fevers, chills, weight loss CV: Denies chest pain, shortness of breath, palpitations Skin: Cyst of the right mandibular margin and several areas of light scarring from  Physical Exam    01/02/2023    2:42 PM 01/02/2023    9:35 AM 11/07/2022    9:20 AM  Vitals with BMI  Height 5\' 3"  5\' 3"  5\' 3"   Weight 128 lbs 10 oz 129 lbs 6 oz 130 lbs 3 oz  BMI 22.79 22.93  23.07  Systolic 130 114 161  Diastolic 83 75 79  Pulse 93 85 76    General:  No acute distress,  Alert and oriented, Non-Toxic, Normal speech and affect Integument: As noted there is a 2 mm cyst at the right mandibular margin just behind the chin.  She does have some acne scarring. Mammogram: Patient is being closely followed for breast cancer due to being high risk for malignancy Assessment/Plan Cyst: Discussed with the patient the possibility of removing the cyst in the office under local.  She is amenable to this.  Will schedule.  She understands there will be a small scar resulting from the excision. Acne scarring: Patient was given information on both the halo and the BBL today.  She is not interested in scheduling at this time but we will discuss it further when she returns for her procedure.  Santiago Glad 01/02/2023, 4:43 PM

## 2023-01-02 NOTE — Patient Instructions (Signed)
-   Resume Usual Activities. Notify Physician of any unusual bleeding, erythema or concern for side effects as reviewed above. - Apply ice prn for pain - Tylenol prn for pain  - Conitnue exercise and HIT training as tolerated! - Use voltaren gel to your neck and shoulders up to 4 times daily for tightness and pain - Follow up as scheduled in 2 months, or as frequently as every 1 month if feasible financially and beneficial; would discuss with your insurer before scheudling - I have sent a script for your voltaren gel

## 2023-01-08 ENCOUNTER — Other Ambulatory Visit: Payer: Self-pay | Admitting: General Surgery

## 2023-01-08 DIAGNOSIS — Z1502 Genetic susceptibility to malignant neoplasm of ovary: Secondary | ICD-10-CM

## 2023-01-08 DIAGNOSIS — Z1501 Genetic susceptibility to malignant neoplasm of breast: Secondary | ICD-10-CM

## 2023-01-08 DIAGNOSIS — Z1589 Genetic susceptibility to other disease: Secondary | ICD-10-CM

## 2023-01-08 DIAGNOSIS — Z1509 Genetic susceptibility to other malignant neoplasm: Secondary | ICD-10-CM

## 2023-01-14 ENCOUNTER — Encounter: Payer: Self-pay | Admitting: Physical Therapy

## 2023-01-21 ENCOUNTER — Other Ambulatory Visit: Payer: Self-pay | Admitting: Physical Medicine and Rehabilitation

## 2023-02-12 ENCOUNTER — Ambulatory Visit: Payer: Managed Care, Other (non HMO) | Admitting: Physical Therapy

## 2023-02-13 ENCOUNTER — Ambulatory Visit: Payer: Managed Care, Other (non HMO) | Admitting: Plastic Surgery

## 2023-02-13 ENCOUNTER — Encounter: Payer: Managed Care, Other (non HMO) | Admitting: Physical Medicine and Rehabilitation

## 2023-02-15 ENCOUNTER — Encounter: Payer: Self-pay | Admitting: General Surgery

## 2023-02-18 ENCOUNTER — Ambulatory Visit
Admission: RE | Admit: 2023-02-18 | Discharge: 2023-02-18 | Disposition: A | Discharge status: Home / Self Care | Payer: Managed Care, Other (non HMO) | Source: Ambulatory Visit | Attending: General Surgery | Admitting: General Surgery

## 2023-02-18 DIAGNOSIS — Z1589 Genetic susceptibility to other disease: Secondary | ICD-10-CM

## 2023-02-18 DIAGNOSIS — Z1509 Genetic susceptibility to other malignant neoplasm: Secondary | ICD-10-CM

## 2023-02-18 DIAGNOSIS — Z1501 Genetic susceptibility to malignant neoplasm of breast: Secondary | ICD-10-CM

## 2023-02-18 DIAGNOSIS — Z1502 Genetic susceptibility to malignant neoplasm of ovary: Secondary | ICD-10-CM

## 2023-02-18 MED ORDER — GADOPICLENOL 0.5 MMOL/ML IV SOLN
6.0000 mL | Freq: Once | INTRAVENOUS | Status: AC | PRN
  Administered 2023-02-18: 6 mL via INTRAVENOUS

## 2023-02-21 ENCOUNTER — Other Ambulatory Visit: Payer: Self-pay | Admitting: General Surgery

## 2023-02-21 DIAGNOSIS — R928 Other abnormal and inconclusive findings on diagnostic imaging of breast: Secondary | ICD-10-CM

## 2023-02-25 ENCOUNTER — Ambulatory Visit
Admission: RE | Admit: 2023-02-25 | Discharge: 2023-02-25 | Disposition: A | Discharge status: Home / Self Care | Payer: Managed Care, Other (non HMO) | Source: Ambulatory Visit | Attending: General Surgery | Admitting: General Surgery

## 2023-02-25 DIAGNOSIS — R928 Other abnormal and inconclusive findings on diagnostic imaging of breast: Secondary | ICD-10-CM

## 2023-02-25 HISTORY — PX: BREAST BIOPSY: SHX20

## 2023-02-25 MED ORDER — GADOPICLENOL 0.5 MMOL/ML IV SOLN
6.0000 mL | Freq: Once | INTRAVENOUS | Status: AC | PRN
  Administered 2023-02-25: 6 mL via INTRAVENOUS

## 2023-02-27 ENCOUNTER — Other Ambulatory Visit: Payer: Self-pay | Admitting: General Surgery

## 2023-02-27 DIAGNOSIS — Z1501 Genetic susceptibility to malignant neoplasm of breast: Secondary | ICD-10-CM

## 2023-03-18 ENCOUNTER — Encounter: Payer: Managed Care, Other (non HMO) | Admitting: Physical Medicine and Rehabilitation

## 2023-03-26 ENCOUNTER — Encounter: Payer: Managed Care, Other (non HMO) | Admitting: Obstetrics & Gynecology

## 2023-03-26 ENCOUNTER — Other Ambulatory Visit: Payer: Managed Care, Other (non HMO)

## 2023-03-27 ENCOUNTER — Other Ambulatory Visit: Payer: Self-pay

## 2023-03-27 ENCOUNTER — Ambulatory Visit: Payer: Managed Care, Other (non HMO) | Attending: Family Medicine

## 2023-03-27 DIAGNOSIS — M62838 Other muscle spasm: Secondary | ICD-10-CM | POA: Diagnosis not present

## 2023-03-27 DIAGNOSIS — M6281 Muscle weakness (generalized): Secondary | ICD-10-CM | POA: Insufficient documentation

## 2023-03-27 DIAGNOSIS — X58XXXD Exposure to other specified factors, subsequent encounter: Secondary | ICD-10-CM | POA: Diagnosis not present

## 2023-03-27 DIAGNOSIS — M546 Pain in thoracic spine: Secondary | ICD-10-CM | POA: Diagnosis not present

## 2023-03-27 DIAGNOSIS — R29898 Other symptoms and signs involving the musculoskeletal system: Secondary | ICD-10-CM | POA: Insufficient documentation

## 2023-03-27 DIAGNOSIS — M542 Cervicalgia: Secondary | ICD-10-CM | POA: Diagnosis not present

## 2023-03-27 DIAGNOSIS — S29012D Strain of muscle and tendon of back wall of thorax, subsequent encounter: Secondary | ICD-10-CM | POA: Diagnosis not present

## 2023-03-27 DIAGNOSIS — R252 Cramp and spasm: Secondary | ICD-10-CM

## 2023-03-27 NOTE — Therapy (Signed)
OUTPATIENT PHYSICAL THERAPY CERVICAL EVALUATION   Patient Name: Adrienne Williams MRN: [id number] DOB:[birth year], 42 y.o., female Today's Date: 03/27/2023  END OF SESSION:  PT End of Session - 03/27/23 1029     Visit Number 1    Date for PT Re-Evaluation 05/22/23    Authorization Type [employer]    PT Start Time 1020    PT Stop Time 1100    PT Time Calculation (min) 40 min    Activity Tolerance Patient limited by pain    Behavior During Therapy Anxious             Past Medical History:  Diagnosis Date   Anxiety    Asthma    Depression    Family history of breast cancer    Frank breech presentation 05/29/2018   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   Hemorrhoids    Hx of varicella    PONV (postoperative nausea and vomiting)    "little nauseated"   Postpartum care following cesarean delivery (6/14) 01/18/2016   Past Surgical History:  Procedure Laterality Date   BREAST BIOPSY Right 02/25/2023   CESAREAN SECTION N/A 01/18/2016   Procedure: Primary CESAREAN SECTION;  Surgeon: Genia Del, MD;  Location: [facility];  Service: Obstetrics;  Laterality: N/A;  EDD: 01/25/16   CESAREAN SECTION N/A 05/29/2018   Procedure: Repeat CESAREAN SECTION;  Surgeon: Shea Evans, MD;  Location: [facility];  Service: Obstetrics;  Laterality: N/A;  EDD: 06/10/18   COLONOSCOPY     CYSTECTOMY  2002   pilonidal region   DIAGNOSTIC LAPAROSCOPY WITH REMOVAL OF ECTOPIC PREGNANCY Right 05/29/2020   Procedure: DIAGNOSTIC LAPAROSCOPY WITH REMOVAL OF ECTOPIC PREGNANCY;  Surgeon: Shea Evans, MD;  Location: [facility] OR;  Service: Gynecology;  Laterality: Right;   MOLE REMOVAL     UNILATERAL SALPINGECTOMY Right 05/29/2020   Procedure: UNILATERAL SALPINGECTOMY;  Surgeon: Shea Evans, MD;  Location: [facility] OR;  Service: Gynecology;  Laterality: Right;   WISDOM TOOTH EXTRACTION     Patient Active Problem List   Diagnosis Date Noted   Neck pain, chronic 06/27/2022    Fibromyalgia 06/27/2022   Myofascial pain 06/27/2022   Internal hemorrhoids 06/13/2022   Rectal bleeding 06/13/2022   Family history of breast cancer 01/18/2022   Monoallelic mutation of CHEK2 gene in female patient 01/18/2022   Anxiety and depression 05/31/2018   Rh negative, maternal 05/31/2018   Homero Fellers breech presentation 05/29/2018   Status post repeat low transverse cesarean section 05/29/2018   Postpartum care following cesarean delivery (10/24) 05/29/2018    PCP: Jarrett Soho, PA-C   REFERRING PROVIDER: Jackelyn Poling, DO  REFERRING DIAG: [phone] (ICD-10-CM) - Other muscle spasm  THERAPY DIAG:  Muscle weakness (generalized) - Plan: PT plan of care cert/re-cert  Pain in thoracic spine - Plan: PT plan of care cert/re-cert  Cervicalgia - Plan: PT plan of care cert/re-cert  Other muscle spasm - Plan: PT plan of care cert/re-cert  Cramp and spasm - Plan: PT plan of care cert/re-cert  Rationale for Evaluation and Treatment: Rehabilitation  ONSET DATE: 03/26/2023  SUBJECTIVE:  SUBJECTIVE STATEMENT: Patient reports she was helping her husband install garage seal on 03-10-23.  She felt severe pain in thoracic spine and neck onset around 03-13-23 and explains that she noticed that she had a lot of swelling and had to get a bigger bra and extender for her bra due to the swelling.  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  Difficulty with driving, sleeping, IADL's self care and working.  I have two kids and caring for them and the dogs has been very challenging.  I am usually very active but unable to do anything right now.  Hand dominance: Right  PERTINENT HISTORY:  na  PAIN:  Are you having pain?  Pain is 6-7/10 currently but as bad as 8/10 in  the past 24 hours.    PRECAUTIONS: None  RED FLAGS: None     WEIGHT BEARING RESTRICTIONS: No  FALLS:  Has patient fallen in last 6 months? No  LIVING ENVIRONMENT: Lives with: lives with their family and lives with their spouse Lives in: House/apartment   OCCUPATION: Pharmacist  PLOF: Independent, Independent with basic ADLs, Independent with household mobility without device, Independent with community mobility without device, Independent with homemaking with ambulation, Independent with gait, and Independent with transfers  PATIENT GOALS: to eliminate pain and be able to work and do my usual daily activities and sleep without interruption  NEXT MD VISIT: 04/10/23  OBJECTIVE:   DIAGNOSTIC FINDINGS:  none  PATIENT SURVEYS:  NDI 80% self disability score  COGNITION: Overall cognitive status: Within functional limits for tasks assessed  SENSATION: WFL  POSTURE: rounded shoulders  PALPATION: Tight bands and trigger points in R > L upper traps and rhomboids   CERVICAL ROM:   Active ROM A/PROM (deg) eval  Flexion WNL  Extension WNL  Right lateral flexion WNL  Left lateral flexion WNL  Right rotation WNL  Left rotation WNL   (Blank rows = not tested)  UPPER EXTREMITY ROM:  .   UPPER EXTREMITY MMT: 5/5 throughout bilateral upper extremities but patient states that she "is going to be so sore" from the muscle testing throughout the test.   TODAY'S TREATMENT:                                                                                                                              DATE: 8/21 Initial eval completed and initiated HEP MELT method for sub-occipitals Lying on foam roller x 1 min Horizontal abduction x 20 on foam roller with red band but patient states "this is really hard" switch to yellow for HEP D2 PNF x 20 on foam roller with yellow band  PATIENT EDUCATION:  Education details: initiated HEP Person educated: Patient Education method:  Programmer, multimedia, Facilities manager, Verbal cues, and Handouts Education comprehension: verbalized understanding, returned demonstration, and verbal cues required  HOME EXERCISE PROGRAM: Access Code: [email or web address] URL: [email or web address] Date: 03/27/2023 Prepared by: Mikey Kirschner  Exercises - Supine Chest Stretch on Foam Roll  - 1 x daily - 7 x weekly - 3 sets - 10 reps - Thoracic Extension Mobilization on Foam Roll  - 1 x daily - 7 x weekly - 3 sets - 10 reps - Supine Shoulder Horizontal Abduction with Resistance  - 1 x daily - 7 x weekly - 3 sets - 10 reps - Supine PNF D2 Flexion with Resistance  - 1 x daily - 7 x weekly - 3 sets - 10 reps  ASSESSMENT:  CLINICAL IMPRESSION: Patient is a 42  y.o. female who was seen today for physical therapy evaluation and treatment for thoracic strain.  She presents with normal C spine ROM, and UE strength but with trigger points and tight bands in the cervical paraspinals, R > L upper traps and rhomboids.  Symptoms consistent with thoracic strain.  She should respond well to skilled PT for  postural strengthening, STM, and DN.    OBJECTIVE IMPAIRMENTS: increased fascial restrictions and increased muscle spasms.   ACTIVITY LIMITATIONS: carrying, lifting, bending, sitting, squatting, sleeping, transfers, bed mobility, bathing, dressing, and hygiene/grooming  PARTICIPATION LIMITATIONS: meal prep, cleaning, laundry, interpersonal relationship, driving, shopping, community activity, and occupation  PERSONAL FACTORS: Education, Past/current experiences, and 3+ comorbidities: anxiety , depression and chronic pain  are also affecting patient's functional outcome.   REHAB POTENTIAL: Fair due to   CLINICAL DECISION MAKING: Unstable/unpredictable  EVALUATION COMPLEXITY: High   GOALS: Goals reviewed with patient? Yes  SHORT TERM GOALS: Target date: 04/24/2023   Patient to report pain no greater than 2/10  Baseline:  Goal status: INITIAL  2.   Patient will be independent with initial HEP  Baseline:  Goal status: INITIAL  \  LONG TERM GOALS: Target date: 05/22/2023   Patient to report pain no greater than 2/10  Baseline:  Goal status: INITIAL  2.  Patient to be independent with advanced HEP  Baseline:  Goal status: INITIAL  3.  Patient to be able to sleep through the night  Baseline:  Goal status: INITIAL  4.  Patient to be able to return to work Baseline:  Goal status: INITIAL  5.  Neck pain disablity score to improve by 5 Baseline:  Goal status: INITIAL  6.  Patient to report 85% improvement in overall symptoms  Baseline:  Goal status: INITIAL   PLAN:  PT FREQUENCY: 1-2x/week  PT DURATION: 8 weeks  PLANNED INTERVENTIONS: Therapeutic exercises, Therapeutic activity, Neuromuscular re-education, Balance training, Gait training, Patient/Family education, Self Care, Joint mobilization, Vestibular training, Canalith repositioning, Aquatic Therapy, Dry Needling, Electrical stimulation, Spinal mobilization, Cryotherapy, Moist heat, Taping, Vasopneumatic device, Traction, Ultrasound, Ionotophoresis 4mg /ml Dexamethasone, and Manual therapy  PLAN FOR NEXT SESSION: UBE, review HEP, progress postural strengthneing   Vernell Barrier, PT 03/27/2023, 11:02 PM

## 2023-03-28 ENCOUNTER — Ambulatory Visit: Payer: Managed Care, Other (non HMO) | Admitting: Physical Therapy

## 2023-03-28 DIAGNOSIS — M6281 Muscle weakness (generalized): Secondary | ICD-10-CM

## 2023-03-28 DIAGNOSIS — M62838 Other muscle spasm: Secondary | ICD-10-CM | POA: Diagnosis not present

## 2023-03-28 DIAGNOSIS — M546 Pain in thoracic spine: Secondary | ICD-10-CM

## 2023-03-28 DIAGNOSIS — M542 Cervicalgia: Secondary | ICD-10-CM

## 2023-03-28 NOTE — Therapy (Signed)
OUTPATIENT PHYSICAL THERAPY CERVICAL PROGRESS NOTE   Patient Name: Adrienne Williams MRN: [id number] DOB:[birth year], 42 y.o., female Today's Date: 03/28/2023  END OF SESSION:  PT End of Session - 03/28/23 1014     Visit Number 2    Date for PT Re-Evaluation 05/22/23    Authorization Type [employer]    PT Start Time 1015    PT Stop Time 1100    PT Time Calculation (min) 45 min    Activity Tolerance Patient tolerated treatment well             Past Medical History:  Diagnosis Date   Anxiety    Asthma    Depression    Family history of breast cancer    Frank breech presentation 05/29/2018   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   Hemorrhoids    Hx of varicella    PONV (postoperative nausea and vomiting)    "little nauseated"   Postpartum care following cesarean delivery (6/14) 01/18/2016   Past Surgical History:  Procedure Laterality Date   BREAST BIOPSY Right 02/25/2023   CESAREAN SECTION N/A 01/18/2016   Procedure: Primary CESAREAN SECTION;  Surgeon: Genia Del, MD;  Location: [facility];  Service: Obstetrics;  Laterality: N/A;  EDD: 01/25/16   CESAREAN SECTION N/A 05/29/2018   Procedure: Repeat CESAREAN SECTION;  Surgeon: Shea Evans, MD;  Location: [facility];  Service: Obstetrics;  Laterality: N/A;  EDD: 06/10/18   COLONOSCOPY     CYSTECTOMY  2002   pilonidal region   DIAGNOSTIC LAPAROSCOPY WITH REMOVAL OF ECTOPIC PREGNANCY Right 05/29/2020   Procedure: DIAGNOSTIC LAPAROSCOPY WITH REMOVAL OF ECTOPIC PREGNANCY;  Surgeon: Shea Evans, MD;  Location: [facility] OR;  Service: Gynecology;  Laterality: Right;   MOLE REMOVAL     UNILATERAL SALPINGECTOMY Right 05/29/2020   Procedure: UNILATERAL SALPINGECTOMY;  Surgeon: Shea Evans, MD;  Location: [facility] OR;  Service: Gynecology;  Laterality: Right;   WISDOM TOOTH EXTRACTION     Patient Active Problem List   Diagnosis Date Noted   Neck pain, chronic 06/27/2022   Fibromyalgia 06/27/2022    Myofascial pain 06/27/2022   Internal hemorrhoids 06/13/2022   Rectal bleeding 06/13/2022   Family history of breast cancer 01/18/2022   Monoallelic mutation of CHEK2 gene in female patient 01/18/2022   Anxiety and depression 05/31/2018   Rh negative, maternal 05/31/2018   Homero Fellers breech presentation 05/29/2018   Status post repeat low transverse cesarean section 05/29/2018   Postpartum care following cesarean delivery (10/24) 05/29/2018    PCP: Jarrett Soho, PA-C   REFERRING PROVIDER: Jackelyn Poling, DO  REFERRING DIAG: [phone] (ICD-10-CM) - Other muscle spasm  THERAPY DIAG:  Pain in thoracic spine  Cervicalgia  Muscle weakness (generalized)  Rationale for Evaluation and Treatment: Rehabilitation  ONSET DATE: 03/26/2023  SUBJECTIVE:  SUBJECTIVE STATEMENT: I was doing well until this.  Today lots of pain in both lats, goes up into the neck/headache.  Some swelling off/on.  Spasms. Unable to work now.      Previously doing planks and able to lift one arm  EVAL: Patient reports she was helping her husband install garage seal on 03-10-23.  She felt severe pain in thoracic spine and neck onset around 03-13-23 and explains that she noticed that she had a lot of swelling and had to get a bigger bra and extender for her bra due to the swelling.  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  Difficulty with driving, sleeping, IADL's self care and working.  I have two kids and caring for them and the dogs has been very challenging.  I am usually very active but unable to do anything right now.  Hand dominance: Right  PERTINENT HISTORY:  na  PAIN:  Are you having pain?  Pain is 1-2/10 but rushing/quickly to get kids to school; aggravated [place] 8/10    8/22: both lats, headache, back of neck Even laundry is a struggle. PRECAUTIONS: None  RED FLAGS: None     WEIGHT BEARING RESTRICTIONS: No  FALLS:  Has patient fallen in last 6 months? No  LIVING ENVIRONMENT: Lives with: lives with their family and lives with their spouse Lives in: House/apartment   OCCUPATION: Pharmacist  PLOF: Independent, Independent with basic ADLs, Independent with household mobility without device, Independent with community mobility without device, Independent with homemaking with ambulation, Independent with gait, and Independent with transfers  PATIENT GOALS: to eliminate pain and be able to work and do my usual daily activities and sleep without interruption  NEXT MD VISIT: 04/10/23  OBJECTIVE:   DIAGNOSTIC FINDINGS:  MRI showed small lesion 4 mm breast and had biopsy in July; normal but recommended another MRI and biopsy   PATIENT SURVEYS:  NDI 80% self disability score  COGNITION: Overall cognitive status: Within functional limits for tasks assessed  SENSATION: WFL  POSTURE: rounded shoulders  PALPATION: Tight bands and trigger points in R > L upper traps and rhomboids   CERVICAL ROM:   Active ROM A/PROM (deg) eval  Flexion WNL  Extension WNL  Right lateral flexion WNL  Left lateral flexion WNL  Right rotation WNL  Left rotation WNL   (Blank rows = not tested)  UPPER EXTREMITY ROM:  .   UPPER EXTREMITY MMT: 5/5 throughout bilateral upper extremities but patient states that she "is going to be so sore" from the muscle testing throughout the test.   TODAY'S TREATMENT:            DATE: 8/22 Ther Ex: instructed in specific muscle group stretching (lats and teres)  Cervical retraction for postural alignment to dec strain on anterior muscles and suboccipitals Manual therapy to bil lats, teres, upper traps, suboccipitals Trigger Point Dry-Needling  Treatment instructions: Expect mild to moderate muscle soreness. S/S of  pneumothorax if dry needled over a lung field, and to seek immediate medical attention should they occur. Patient verbalized understanding of these instructions and education.  Patient Consent Given: Yes Education handout provided: Previously provided Muscles treated: bil lats, teres, upper traps, suboccipitals Electrical stimulation performed: No Parameters: N/A Treatment response/outcome: dec tender point size/number  Moist heat supine 2 min  DATE: 8/21 Initial eval completed and initiated HEP MELT method for sub-occipitals Lying on foam roller x 1 min Horizontal abduction x 20 on foam roller with red band but patient states "this is really hard" switch to yellow for HEP D2 PNF x 20 on foam roller with yellow band  PATIENT EDUCATION:  Education details: initiated HEP Person educated: Patient Education method: Programmer, multimedia, Facilities manager, Verbal cues, and Handouts Education comprehension: verbalized understanding, returned demonstration, and verbal cues required  HOME EXERCISE PROGRAM: Access Code: [email or web address] URL: [email or web address] Date: 03/28/2023 Prepared by: Lavinia Sharps  Exercises - Supine Chest Stretch on Foam Roll  - 1 x daily - 7 x weekly - 3 sets - 10 reps - Thoracic Extension Mobilization on Foam Roll  - 1 x daily - 7 x weekly - 3 sets - 10 reps - Supine Shoulder Horizontal Abduction with Resistance  - 1 x daily - 7 x weekly - 3 sets - 10 reps - Supine PNF D2 Flexion with Resistance  - 1 x daily - 7 x weekly - 3 sets - 10 reps - Latissimus Dorsi Stretch at Wall  - 2 x daily - 7 x weekly - 1 sets - 2-3 reps - 20 hold - Prone Chest Stretch on Chair  - 1 x daily - 7 x weekly - 1 sets - 2-3 reps - 20 hold  ASSESSMENT:  CLINICAL IMPRESSION: The patient has a positive response to dry needling and manual therapy to stimulate underlying myofascial  trigger points and muscular tissue for management of neuromusculoskeletal pain and address movement impairments.  Much improved soft tissue mobility and decreased tender point size and number following treatment session.   The patient was encouraged in regular performance of HEP post DN including soft tissue lengthening and strengthening exercises to enhance long term benefit.       OBJECTIVE IMPAIRMENTS: increased fascial restrictions and increased muscle spasms.   ACTIVITY LIMITATIONS: carrying, lifting, bending, sitting, squatting, sleeping, transfers, bed mobility, bathing, dressing, and hygiene/grooming  PARTICIPATION LIMITATIONS: meal prep, cleaning, laundry, interpersonal relationship, driving, shopping, community activity, and occupation  PERSONAL FACTORS: Education, Past/current experiences, and 3+ comorbidities: anxiety , depression and chronic pain  are also affecting patient's functional outcome.   REHAB POTENTIAL: Fair due to   CLINICAL DECISION MAKING: Unstable/unpredictable  EVALUATION COMPLEXITY: High   GOALS: Goals reviewed with patient? Yes  SHORT TERM GOALS: Target date: 04/24/2023   Patient to report pain no greater than 2/10  Baseline:  Goal status: INITIAL  2.  Patient will be independent with initial HEP  Baseline:  Goal status: INITIAL  \  LONG TERM GOALS: Target date: 05/22/2023   Patient to report pain no greater than 2/10  Baseline:  Goal status: INITIAL  2.  Patient to be independent with advanced HEP  Baseline:  Goal status: INITIAL  3.  Patient to be able to sleep through the night  Baseline:  Goal status: INITIAL  4.  Patient to be able to return to work Baseline:  Goal status: INITIAL  5.  Neck pain disablity score to improve by 5 Baseline:  Goal status: INITIAL  6.  Patient to report 85% improvement in overall symptoms  Baseline:  Goal status: INITIAL   PLAN:  PT FREQUENCY: 1-2x/week  PT DURATION: 8 weeks  PLANNED  INTERVENTIONS: Therapeutic exercises, Therapeutic activity, Neuromuscular re-education, Balance training, Gait training, Patient/Family education, Self Care, Joint mobilization, Vestibular training, Canalith repositioning, Aquatic Therapy, Dry Needling, Electrical stimulation, Spinal mobilization, Cryotherapy, Moist heat, Taping, Vasopneumatic  device, Traction, Ultrasound, Ionotophoresis 4mg /ml Dexamethasone, and Manual therapy  PLAN FOR NEXT SESSION: assess response to DN;  foam roll lat stretch;  lacrosse ball trigger point release; UBE, review HEP, progress postural strengthening  Lavinia Sharps, PT 03/28/23 10:47 PM Phone: [phone] Fax: [phone]

## 2023-04-02 ENCOUNTER — Ambulatory Visit: Payer: Managed Care, Other (non HMO) | Admitting: Physical Therapy

## 2023-04-02 DIAGNOSIS — M546 Pain in thoracic spine: Secondary | ICD-10-CM

## 2023-04-02 DIAGNOSIS — M6281 Muscle weakness (generalized): Secondary | ICD-10-CM

## 2023-04-02 DIAGNOSIS — M542 Cervicalgia: Secondary | ICD-10-CM

## 2023-04-02 DIAGNOSIS — M62838 Other muscle spasm: Secondary | ICD-10-CM | POA: Diagnosis not present

## 2023-04-02 NOTE — Therapy (Signed)
OUTPATIENT PHYSICAL THERAPY CERVICAL PROGRESS NOTE   Patient Name: Adrienne Williams MRN: [id number] DOB:[birth year], 42 y.o., female Today's Date: 04/02/2023  END OF SESSION:  PT End of Session - 04/02/23 1016     Visit Number 3    Date for PT Re-Evaluation 05/22/23    Authorization Type [employer]    PT Start Time 1015    PT Stop Time 1100    PT Time Calculation (min) 45 min    Activity Tolerance Patient tolerated treatment well             Past Medical History:  Diagnosis Date   Anxiety    Asthma    Depression    Family history of breast cancer    Frank breech presentation 05/29/2018   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   Hemorrhoids    Hx of varicella    PONV (postoperative nausea and vomiting)    "little nauseated"   Postpartum care following cesarean delivery (6/14) 01/18/2016   Past Surgical History:  Procedure Laterality Date   BREAST BIOPSY Right 02/25/2023   CESAREAN SECTION N/A 01/18/2016   Procedure: Primary CESAREAN SECTION;  Surgeon: Genia Del, MD;  Location: [facility];  Service: Obstetrics;  Laterality: N/A;  EDD: 01/25/16   CESAREAN SECTION N/A 05/29/2018   Procedure: Repeat CESAREAN SECTION;  Surgeon: Shea Evans, MD;  Location: [facility];  Service: Obstetrics;  Laterality: N/A;  EDD: 06/10/18   COLONOSCOPY     CYSTECTOMY  2002   pilonidal region   DIAGNOSTIC LAPAROSCOPY WITH REMOVAL OF ECTOPIC PREGNANCY Right 05/29/2020   Procedure: DIAGNOSTIC LAPAROSCOPY WITH REMOVAL OF ECTOPIC PREGNANCY;  Surgeon: Shea Evans, MD;  Location: [facility] OR;  Service: Gynecology;  Laterality: Right;   MOLE REMOVAL     UNILATERAL SALPINGECTOMY Right 05/29/2020   Procedure: UNILATERAL SALPINGECTOMY;  Surgeon: Shea Evans, MD;  Location: [facility] OR;  Service: Gynecology;  Laterality: Right;   WISDOM TOOTH EXTRACTION     Patient Active Problem List   Diagnosis Date Noted   Neck pain, chronic 06/27/2022   Fibromyalgia 06/27/2022    Myofascial pain 06/27/2022   Internal hemorrhoids 06/13/2022   Rectal bleeding 06/13/2022   Family history of breast cancer 01/18/2022   Monoallelic mutation of CHEK2 gene in female patient 01/18/2022   Anxiety and depression 05/31/2018   Rh negative, maternal 05/31/2018   Homero Fellers breech presentation 05/29/2018   Status post repeat low transverse cesarean section 05/29/2018   Postpartum care following cesarean delivery (10/24) 05/29/2018    PCP: Jarrett Soho, PA-C   REFERRING PROVIDER: Jackelyn Poling, DO  REFERRING DIAG: [phone] (ICD-10-CM) - Other muscle spasm  THERAPY DIAG:  Pain in thoracic spine  Cervicalgia  Muscle weakness (generalized)  Rationale for Evaluation and Treatment: Rehabilitation  ONSET DATE: 03/26/2023  SUBJECTIVE:  SUBJECTIVE STATEMENT: Mid back severe pain today.  I stretched over night and that caused it to go into spasm.     Previously doing planks and able to lift one arm  EVAL: Patient reports she was helping her husband install garage seal on 03-10-23.  She felt severe pain in thoracic spine and neck onset around 03-13-23 and explains that she noticed that she had a lot of swelling and had to get a bigger bra and extender for her bra due to the swelling.  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  Difficulty with driving, sleeping, IADL's self care and working.  I have two kids and caring for them and the dogs has been very challenging.  I am usually very active but unable to do anything right now.  Hand dominance: Right  PERTINENT HISTORY:  na  PAIN:  PAIN:  Are you having pain? Yes NPRS scale: 8/10 Pain location: right > left mid back pain, neck pain  Aggravating factors: stress; overstretching Relieving  factors: DN   PRECAUTIONS: None  RED FLAGS: None     WEIGHT BEARING RESTRICTIONS: No  FALLS:  Has patient fallen in last 6 months? No  LIVING ENVIRONMENT: Lives with: lives with their family and lives with their spouse Lives in: House/apartment   OCCUPATION: Pharmacist  PLOF: Independent, Independent with basic ADLs, Independent with household mobility without device, Independent with community mobility without device, Independent with homemaking with ambulation, Independent with gait, and Independent with transfers  PATIENT GOALS: to eliminate pain and be able to work and do my usual daily activities and sleep without interruption  NEXT MD VISIT: 04/10/23  OBJECTIVE:   DIAGNOSTIC FINDINGS:  MRI showed small lesion 4 mm breast and had biopsy in July; normal but recommended another MRI and biopsy   PATIENT SURVEYS:  NDI 80% self disability score  COGNITION: Overall cognitive status: Within functional limits for tasks assessed  SENSATION: WFL  POSTURE: rounded shoulders  PALPATION: Tight bands and trigger points in R > L upper traps and rhomboids   CERVICAL ROM:   Active ROM A/PROM (deg) eval  Flexion WNL  Extension WNL  Right lateral flexion WNL  Left lateral flexion WNL  Right rotation WNL  Left rotation WNL   (Blank rows = not tested)  UPPER EXTREMITY ROM:  .   UPPER EXTREMITY MMT: 5/5 throughout bilateral upper extremities but patient states that she "is going to be so sore" from the muscle testing throughout the test.   TODAY'S TREATMENT:      DATE: 8/27 Given red band for home use with HEP Melt method for c-sp review Lying on foam roll vertically for muscle relaxation Discussed prioritizing sleep Discussed brain's role in pain Manual therapy to right lats, right teres, bil upper traps, bil suboccipitals; bil levator scap Trigger Point Dry-Needling  Treatment instructions: Expect mild to moderate muscle soreness. S/S of pneumothorax if dry  needled over a lung field, and to seek immediate medical attention should they occur. Patient verbalized understanding of these instructions and education.  Patient Consent Given: Yes Education handout provided: Previously provided Muscles treated: right rhomboids, right subscapularis; right lats,right teres, bil upper traps, bil suboccipitals; bil cervical multifidi Electrical stimulation performed: No Parameters: N/A Treatment response/outcome: dec tender point size/number  Moist heat supine 2 min        DATE: 8/22 Ther Ex: instructed in specific muscle group stretching (lats and teres)  Cervical retraction for postural  alignment to dec strain on anterior muscles and suboccipitals Manual therapy to bil lats, teres, upper traps, suboccipitals Trigger Point Dry-Needling  Treatment instructions: Expect mild to moderate muscle soreness. S/S of pneumothorax if dry needled over a lung field, and to seek immediate medical attention should they occur. Patient verbalized understanding of these instructions and education.  Patient Consent Given: Yes Education handout provided: Previously provided Muscles treated: bil lats, teres, upper traps, suboccipitals Electrical stimulation performed: No Parameters: N/A Treatment response/outcome: dec tender point size/number  Moist heat supine 2 min                                                                                                                 DATE: 8/21 Initial eval completed and initiated HEP MELT method for sub-occipitals Lying on foam roller x 1 min Horizontal abduction x 20 on foam roller with red band but patient states "this is really hard" switch to yellow for HEP D2 PNF x 20 on foam roller with yellow band  PATIENT EDUCATION:  Education details: initiated HEP Person educated: Patient Education method: Programmer, multimedia, Facilities manager, Verbal cues, and Handouts Education comprehension: verbalized understanding, returned  demonstration, and verbal cues required  HOME EXERCISE PROGRAM: Access Code: [email or web address] URL: [email or web address] Date: 03/28/2023 Prepared by: Lavinia Sharps  Exercises - Supine Chest Stretch on Foam Roll  - 1 x daily - 7 x weekly - 3 sets - 10 reps - Thoracic Extension Mobilization on Foam Roll  - 1 x daily - 7 x weekly - 3 sets - 10 reps - Supine Shoulder Horizontal Abduction with Resistance  - 1 x daily - 7 x weekly - 3 sets - 10 reps - Supine PNF D2 Flexion with Resistance  - 1 x daily - 7 x weekly - 3 sets - 10 reps - Latissimus Dorsi Stretch at Wall  - 2 x daily - 7 x weekly - 1 sets - 2-3 reps - 20 hold - Prone Chest Stretch on Chair  - 1 x daily - 7 x weekly - 1 sets - 2-3 reps - 20 hold  ASSESSMENT:  CLINICAL IMPRESSION: The patient reports increased pain today after stretching during the night.  Noted spasm in right thoracic paraspinals and tender points in right rhomboids.  Much improved soft tissue mobility following DN and manual therapy.  Discussed strategies for muscle relaxation including meditation and sleep hygiene for central desensitization.       OBJECTIVE IMPAIRMENTS: increased fascial restrictions and increased muscle spasms.   ACTIVITY LIMITATIONS: carrying, lifting, bending, sitting, squatting, sleeping, transfers, bed mobility, bathing, dressing, and hygiene/grooming  PARTICIPATION LIMITATIONS: meal prep, cleaning, laundry, interpersonal relationship, driving, shopping, community activity, and occupation  PERSONAL FACTORS: Education, Past/current experiences, and 3+ comorbidities: anxiety , depression and chronic pain  are also affecting patient's functional outcome.   REHAB POTENTIAL: Fair due to   CLINICAL DECISION MAKING: Unstable/unpredictable  EVALUATION COMPLEXITY: High   GOALS: Goals reviewed with patient? Yes  SHORT TERM GOALS: Target date: 04/24/2023   Patient to report pain  no greater than 2/10  Baseline:  Goal status:  INITIAL  2.  Patient will be independent with initial HEP  Baseline:  Goal status: INITIAL  \  LONG TERM GOALS: Target date: 05/22/2023   Patient to report pain no greater than 2/10  Baseline:  Goal status: INITIAL  2.  Patient to be independent with advanced HEP  Baseline:  Goal status: INITIAL  3.  Patient to be able to sleep through the night  Baseline:  Goal status: INITIAL  4.  Patient to be able to return to work Baseline:  Goal status: INITIAL  5.  Neck pain disablity score to improve by 5 Baseline:  Goal status: INITIAL  6.  Patient to report 85% improvement in overall symptoms  Baseline:  Goal status: INITIAL   PLAN:  PT FREQUENCY: 1-2x/week  PT DURATION: 8 weeks  PLANNED INTERVENTIONS: Therapeutic exercises, Therapeutic activity, Neuromuscular re-education, Balance training, Gait training, Patient/Family education, Self Care, Joint mobilization, Vestibular training, Canalith repositioning, Aquatic Therapy, Dry Needling, Electrical stimulation, Spinal mobilization, Cryotherapy, Moist heat, Taping, Vasopneumatic device, Traction, Ultrasound, Ionotophoresis 4mg /ml Dexamethasone, and Manual therapy  PLAN FOR NEXT SESSION:  DN;   lacrosse ball trigger point release; UBE, review HEP, progress postural strengthening  Lavinia Sharps, PT 04/02/23 7:14 PM Phone: [phone] Fax: [phone]

## 2023-04-04 ENCOUNTER — Ambulatory Visit: Payer: Managed Care, Other (non HMO) | Admitting: Physical Therapy

## 2023-04-05 ENCOUNTER — Other Ambulatory Visit: Payer: Self-pay | Admitting: General Surgery

## 2023-04-05 DIAGNOSIS — Z1501 Genetic susceptibility to malignant neoplasm of breast: Secondary | ICD-10-CM

## 2023-04-09 ENCOUNTER — Encounter: Payer: Self-pay | Admitting: Physical Therapy

## 2023-04-11 ENCOUNTER — Encounter: Payer: Self-pay | Admitting: Physical Therapy

## 2023-04-13 ENCOUNTER — Ambulatory Visit
Admission: RE | Admit: 2023-04-13 | Discharge: 2023-04-13 | Disposition: A | Discharge status: Home / Self Care | Payer: Managed Care, Other (non HMO) | Source: Ambulatory Visit | Attending: General Surgery | Admitting: General Surgery

## 2023-04-13 DIAGNOSIS — Z1502 Genetic susceptibility to malignant neoplasm of ovary: Secondary | ICD-10-CM

## 2023-04-13 MED ORDER — GADOPICLENOL 0.5 MMOL/ML IV SOLN
5.0000 mL | Freq: Once | INTRAVENOUS | Status: AC | PRN
  Administered 2023-04-13: 5 mL via INTRAVENOUS

## 2023-04-14 NOTE — Therapy (Signed)
OUTPATIENT PHYSICAL THERAPY CERVICAL TREATMENT NOTE   Patient Name: Adrienne Williams MRN: [id number] DOB:June 13, 1981, 42 y.o., female Today's Date: 04/15/2023  END OF SESSION:  PT End of Session - 04/15/23 1453     Visit Number 4    Date for PT Re-Evaluation 05/22/23    Authorization Type [employer]    PT Start Time 1453    PT Stop Time 1542   heat x 10 at end   PT Time Calculation (min) 49 min    Activity Tolerance Patient tolerated treatment well    Behavior During Therapy Anxious              Past Medical History:  Diagnosis Date   Anxiety    Asthma    Depression    Family history of breast cancer    Frank breech presentation 05/29/2018   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   Hemorrhoids    Hx of varicella    PONV (postoperative nausea and vomiting)    "little nauseated"   Postpartum care following cesarean delivery (6/14) 01/18/2016   Past Surgical History:  Procedure Laterality Date   BREAST BIOPSY Right 02/25/2023   CESAREAN SECTION N/A 01/18/2016   Procedure: Primary CESAREAN SECTION;  Surgeon: Genia Del, MD;  Location: [facility];  Service: Obstetrics;  Laterality: N/A;  EDD: 01/25/16   CESAREAN SECTION N/A 05/29/2018   Procedure: Repeat CESAREAN SECTION;  Surgeon: Shea Evans, MD;  Location: [facility];  Service: Obstetrics;  Laterality: N/A;  EDD: 06/10/18   COLONOSCOPY     CYSTECTOMY  2002   pilonidal region   DIAGNOSTIC LAPAROSCOPY WITH REMOVAL OF ECTOPIC PREGNANCY Right 05/29/2020   Procedure: DIAGNOSTIC LAPAROSCOPY WITH REMOVAL OF ECTOPIC PREGNANCY;  Surgeon: Shea Evans, MD;  Location: [facility] OR;  Service: Gynecology;  Laterality: Right;   MOLE REMOVAL     UNILATERAL SALPINGECTOMY Right 05/29/2020   Procedure: UNILATERAL SALPINGECTOMY;  Surgeon: Shea Evans, MD;  Location: [facility] OR;  Service: Gynecology;  Laterality: Right;   WISDOM TOOTH EXTRACTION     Patient Active Problem List   Diagnosis Date Noted    Neck pain, chronic 06/27/2022   Fibromyalgia 06/27/2022   Myofascial pain 06/27/2022   Internal hemorrhoids 06/13/2022   Rectal bleeding 06/13/2022   Family history of breast cancer 01/18/2022   Monoallelic mutation of CHEK2 gene in female patient 01/18/2022   Anxiety and depression 05/31/2018   Rh negative, maternal 05/31/2018   Homero Fellers breech presentation 05/29/2018   Status post repeat low transverse cesarean section 05/29/2018   Postpartum care following cesarean delivery (10/24) 05/29/2018    PCP: Jarrett Soho, PA-C   REFERRING PROVIDER: Jackelyn Poling, DO  REFERRING DIAG: [phone] (ICD-10-CM) - Other muscle spasm  THERAPY DIAG:  Pain in thoracic spine  Cervicalgia  Muscle weakness (generalized)  Other muscle spasm  Cramp and spasm  Rationale for Evaluation and Treatment: Rehabilitation  ONSET DATE: 03/26/2023  SUBJECTIVE:  SUBJECTIVE STATEMENT: Patient reports increased spasm for the past few days. It seems all over the back. Feels it in chest too.  Feels good in the morning but worsens by mid day.   Previously doing planks and able to lift one arm  EVAL: Patient reports she was helping her husband install garage seal on 03-10-23.  She felt severe pain in thoracic spine and neck onset around 03-13-23 and explains that she noticed that she had a lot of swelling and had to get a bigger bra and extender for her bra due to the swelling.  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  Difficulty with driving, sleeping, IADL's self care and working.  I have two kids and caring for them and the dogs has been very challenging.  I am usually very active but unable to do anything right now.  Hand dominance: Right  PERTINENT HISTORY:  na  PAIN:   PAIN:  Are you having pain? Yes NPRS scale: 6-7/10 Pain location: right > left mid back pain, neck pain  Aggravating factors: stress; overstretching Relieving factors: DN   PRECAUTIONS: None  RED FLAGS: None     WEIGHT BEARING RESTRICTIONS: No  FALLS:  Has patient fallen in last 6 months? No  LIVING ENVIRONMENT: Lives with: lives with their family and lives with their spouse Lives in: House/apartment   OCCUPATION: Pharmacist  PLOF: Independent, Independent with basic ADLs, Independent with household mobility without device, Independent with community mobility without device, Independent with homemaking with ambulation, Independent with gait, and Independent with transfers  PATIENT GOALS: to eliminate pain and be able to work and do my usual daily activities and sleep without interruption  NEXT MD VISIT: 04/10/23  OBJECTIVE:   DIAGNOSTIC FINDINGS:  MRI showed small lesion 4 mm breast and had biopsy in July; normal but recommended another MRI and biopsy   PATIENT SURVEYS:  NDI 80% self disability score  COGNITION: Overall cognitive status: Within functional limits for tasks assessed  SENSATION: WFL  POSTURE: rounded shoulders  PALPATION: Tight bands and trigger points in R > L upper traps and rhomboids   CERVICAL ROM:   Active ROM A/PROM (deg) eval  Flexion WNL  Extension WNL  Right lateral flexion WNL  Left lateral flexion WNL  Right rotation WNL  Left rotation WNL   (Blank rows = not tested)  UPPER EXTREMITY ROM:  .   UPPER EXTREMITY MMT: 5/5 throughout bilateral upper extremities but patient states that she "is going to be so sore" from the muscle testing throughout the test.   TODAY'S TREATMENT:      DATE:  04/15/23 UBE L2 x 2 min forward, backward was painful Rows red Tband x 10 Extension red x 10 Horizontal ABD red x 10 ER with scap retraction red x 10 Prone T and Y x 10 (Y very hard) Manual therapy to right lats, right teres, bil upper  traps, bil suboccipitals; bil levator scap Trigger Point Dry-Needling  Treatment instructions: Expect mild to moderate muscle soreness. S/S of pneumothorax if dry needled over a lung field, and to seek immediate medical attention should they occur. Patient verbalized understanding of these instructions and education.  Patient Consent Given: Yes Education handout provided: Previously provided Muscles treated: right lats,right teres, bil upper traps, bil cervical multifidi and T1 Electrical stimulation performed: No Parameters: N/A Treatment response/outcome: dec tender point size/number, twitch response and decreased muscle tension Moist heat supine 10 min at end  of session         8/27 Given red band for home use with HEP Melt method for c-sp review Lying on foam roll vertically for muscle relaxation Discussed prioritizing sleep Discussed brain's role in pain Manual therapy to right lats, right teres, bil upper traps, bil suboccipitals; bil levator scap Trigger Point Dry-Needling  Treatment instructions: Expect mild to moderate muscle soreness. S/S of pneumothorax if dry needled over a lung field, and to seek immediate medical attention should they occur. Patient verbalized understanding of these instructions and education.  Patient Consent Given: Yes Education handout provided: Previously provided Muscles treated: right rhomboids, right subscapularis; right lats,right teres, bil upper traps, bil suboccipitals; bil cervical multifidi Electrical stimulation performed: No Parameters: N/A Treatment response/outcome: dec tender point size/number  Moist heat supine 2 min        DATE: 8/22 Ther Ex: instructed in specific muscle group stretching (lats and teres)  Cervical retraction for postural alignment to dec strain on anterior muscles and suboccipitals Manual therapy to bil lats, teres, upper traps, suboccipitals Trigger Point Dry-Needling  Treatment instructions: Expect mild to  moderate muscle soreness. S/S of pneumothorax if dry needled over a lung field, and to seek immediate medical attention should they occur. Patient verbalized understanding of these instructions and education.  Patient Consent Given: Yes Education handout provided: Previously provided Muscles treated: bil lats, teres, upper traps, suboccipitals Electrical stimulation performed: No Parameters: N/A Treatment response/outcome: dec tender point size/number  Moist heat supine 2 min   PATIENT EDUCATION:  Education details: initiated HEP Person educated: Patient Education method: Programmer, multimedia, Facilities manager, Verbal cues, and Handouts Education comprehension: verbalized understanding, returned demonstration, and verbal cues required  HOME EXERCISE PROGRAM: Access Code: [email or web address] URL: [email or web address] Date: 03/28/2023 Prepared by: Lavinia Sharps  Exercises - Supine Chest Stretch on Foam Roll  - 1 x daily - 7 x weekly - 3 sets - 10 reps - Thoracic Extension Mobilization on Foam Roll  - 1 x daily - 7 x weekly - 3 sets - 10 reps - Supine Shoulder Horizontal Abduction with Resistance  - 1 x daily - 7 x weekly - 3 sets - 10 reps - Supine PNF D2 Flexion with Resistance  - 1 x daily - 7 x weekly - 3 sets - 10 reps - Latissimus Dorsi Stretch at Wall  - 2 x daily - 7 x weekly - 1 sets - 2-3 reps - 20 hold - Prone Chest Stretch on Chair  - 1 x daily - 7 x weekly - 1 sets - 2-3 reps - 20 hold  ASSESSMENT:  CLINICAL IMPRESSION: Gladies reports increased spasms in past few days. It worsens by mid day. She was able to tolerate more standing TE today despite this. She is very weak in her low traps with prone Ys. Good response to DN again this session. She may benefit from more prone spinal stabilization to help with spasms.       OBJECTIVE IMPAIRMENTS: increased fascial restrictions and increased muscle spasms.   ACTIVITY LIMITATIONS: carrying, lifting, bending, sitting, squatting,  sleeping, transfers, bed mobility, bathing, dressing, and hygiene/grooming  PARTICIPATION LIMITATIONS: meal prep, cleaning, laundry, interpersonal relationship, driving, shopping, community activity, and occupation  PERSONAL FACTORS: Education, Past/current experiences, and 3+ comorbidities: anxiety , depression and chronic pain  are also affecting patient's functional outcome.   REHAB POTENTIAL: Fair due to   CLINICAL DECISION MAKING: Unstable/unpredictable  EVALUATION COMPLEXITY: High   GOALS: Goals reviewed with patient? Yes  SHORT TERM GOALS: Target  date: 04/24/2023   Patient to report pain no greater than 2/10  Baseline:  Goal status: INITIAL  2.  Patient will be independent with initial HEP  Baseline:  Goal status: INITIAL  \  LONG TERM GOALS: Target date: 05/22/2023   Patient to report pain no greater than 2/10  Baseline:  Goal status: INITIAL  2.  Patient to be independent with advanced HEP  Baseline:  Goal status: INITIAL  3.  Patient to be able to sleep through the night  Baseline:  Goal status: INITIAL  4.  Patient to be able to return to work Baseline:  Goal status: INITIAL  5.  Neck pain disablity score to improve by 5 Baseline:  Goal status: INITIAL  6.  Patient to report 85% improvement in overall symptoms  Baseline:  Goal status: INITIAL   PLAN:  PT FREQUENCY: 1-2x/week  PT DURATION: 8 weeks  PLANNED INTERVENTIONS: Therapeutic exercises, Therapeutic activity, Neuromuscular re-education, Balance training, Gait training, Patient/Family education, Self Care, Joint mobilization, Vestibular training, Canalith repositioning, Aquatic Therapy, Dry Needling, Electrical stimulation, Spinal mobilization, Cryotherapy, Moist heat, Taping, Vasopneumatic device, Traction, Ultrasound, Ionotophoresis 4mg /ml Dexamethasone, and Manual therapy  PLAN FOR NEXT SESSION:  DN;   lacrosse ball trigger point release; UBE, review HEP, progress postural  strengthening  Solon Palm, PT  04/15/23 5:22 PM Phone: [phone] Fax: [phone]

## 2023-04-15 ENCOUNTER — Encounter: Payer: Self-pay | Admitting: Physical Therapy

## 2023-04-15 ENCOUNTER — Ambulatory Visit: Payer: Managed Care, Other (non HMO) | Attending: Family Medicine | Admitting: Physical Therapy

## 2023-04-15 DIAGNOSIS — M546 Pain in thoracic spine: Secondary | ICD-10-CM | POA: Diagnosis present

## 2023-04-15 DIAGNOSIS — M62838 Other muscle spasm: Secondary | ICD-10-CM | POA: Diagnosis present

## 2023-04-15 DIAGNOSIS — M6281 Muscle weakness (generalized): Secondary | ICD-10-CM | POA: Diagnosis present

## 2023-04-15 DIAGNOSIS — R252 Cramp and spasm: Secondary | ICD-10-CM | POA: Diagnosis present

## 2023-04-15 DIAGNOSIS — M542 Cervicalgia: Secondary | ICD-10-CM | POA: Diagnosis present

## 2023-04-17 ENCOUNTER — Ambulatory Visit: Payer: Managed Care, Other (non HMO) | Admitting: Physical Therapy

## 2023-04-17 DIAGNOSIS — M542 Cervicalgia: Secondary | ICD-10-CM

## 2023-04-17 DIAGNOSIS — M546 Pain in thoracic spine: Secondary | ICD-10-CM | POA: Diagnosis not present

## 2023-04-17 DIAGNOSIS — M6281 Muscle weakness (generalized): Secondary | ICD-10-CM

## 2023-04-17 NOTE — Therapy (Signed)
OUTPATIENT PHYSICAL THERAPY CERVICAL TREATMENT NOTE   Patient Name: Adrienne Williams MRN: [id number] DOB:Feb 14, 1981, 42 y.o., female Today's Date: 04/17/2023  END OF SESSION:  PT End of Session - 04/17/23 1015     Visit Number 5    Date for PT Re-Evaluation 05/22/23    Authorization Type [employer]    PT Start Time 1015    PT Stop Time 1058    PT Time Calculation (min) 43 min    Activity Tolerance Patient tolerated treatment well              Past Medical History:  Diagnosis Date   Anxiety    Asthma    Depression    Family history of breast cancer    Frank breech presentation 05/29/2018   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   Hemorrhoids    Hx of varicella    PONV (postoperative nausea and vomiting)    "little nauseated"   Postpartum care following cesarean delivery (6/14) 01/18/2016   Past Surgical History:  Procedure Laterality Date   BREAST BIOPSY Right 02/25/2023   CESAREAN SECTION N/A 01/18/2016   Procedure: Primary CESAREAN SECTION;  Surgeon: Genia Del, MD;  Location: [facility];  Service: Obstetrics;  Laterality: N/A;  EDD: 01/25/16   CESAREAN SECTION N/A 05/29/2018   Procedure: Repeat CESAREAN SECTION;  Surgeon: Shea Evans, MD;  Location: [facility];  Service: Obstetrics;  Laterality: N/A;  EDD: 06/10/18   COLONOSCOPY     CYSTECTOMY  2002   pilonidal region   DIAGNOSTIC LAPAROSCOPY WITH REMOVAL OF ECTOPIC PREGNANCY Right 05/29/2020   Procedure: DIAGNOSTIC LAPAROSCOPY WITH REMOVAL OF ECTOPIC PREGNANCY;  Surgeon: Shea Evans, MD;  Location: [facility] OR;  Service: Gynecology;  Laterality: Right;   MOLE REMOVAL     UNILATERAL SALPINGECTOMY Right 05/29/2020   Procedure: UNILATERAL SALPINGECTOMY;  Surgeon: Shea Evans, MD;  Location: [facility] OR;  Service: Gynecology;  Laterality: Right;   WISDOM TOOTH EXTRACTION     Patient Active Problem List   Diagnosis Date Noted   Neck pain, chronic 06/27/2022   Fibromyalgia  06/27/2022   Myofascial pain 06/27/2022   Internal hemorrhoids 06/13/2022   Rectal bleeding 06/13/2022   Family history of breast cancer 01/18/2022   Monoallelic mutation of CHEK2 gene in female patient 01/18/2022   Anxiety and depression 05/31/2018   Rh negative, maternal 05/31/2018   Homero Fellers breech presentation 05/29/2018   Status post repeat low transverse cesarean section 05/29/2018   Postpartum care following cesarean delivery (10/24) 05/29/2018    PCP: Jarrett Soho, PA-C   REFERRING PROVIDER: Jackelyn Poling, DO  REFERRING DIAG: [phone] (ICD-10-CM) - Other muscle spasm  THERAPY DIAG:  Pain in thoracic spine  Cervicalgia  Muscle weakness (generalized)  Rationale for Evaluation and Treatment: Rehabilitation  ONSET DATE: 03/26/2023  SUBJECTIVE:  SUBJECTIVE STATEMENT: I had a massage on Sunday and having some back and pelvic discomfort.  Had covid 2 weeks ago and now having tachycardia, going to get checked out.    EVAL: Patient reports she was helping her husband install garage seal on 03-10-23.  She felt severe pain in thoracic spine and neck onset around 03-13-23 and explains that she noticed that she had a lot of swelling and had to get a bigger bra and extender for her bra due to the swelling.  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  Difficulty with driving, sleeping, IADL's self care and working.  I have two kids and caring for them and the dogs has been very challenging.  I am usually very active but unable to do anything right now.  Hand dominance: Right  PERTINENT HISTORY:  na  PAIN:  PAIN:  Are you having pain? Yes NPRS scale: 3-5/10 Pain location: right > left mid back pain, neck pain  Aggravating factors: stress;  overstretching Relieving factors: DN   PRECAUTIONS: None  RED FLAGS: None     WEIGHT BEARING RESTRICTIONS: No  FALLS:  Has patient fallen in last 6 months? No  LIVING ENVIRONMENT: Lives with: lives with their family and lives with their spouse Lives in: House/apartment   OCCUPATION: Pharmacist  PLOF: Independent, Independent with basic ADLs, Independent with household mobility without device, Independent with community mobility without device, Independent with homemaking with ambulation, Independent with gait, and Independent with transfers  PATIENT GOALS: to eliminate pain and be able to work and do my usual daily activities and sleep without interruption  NEXT MD VISIT: 04/10/23  OBJECTIVE:   DIAGNOSTIC FINDINGS:  MRI showed small lesion 4 mm breast and had biopsy in July; normal but recommended another MRI and biopsy   PATIENT SURVEYS:  NDI 80% self disability score  COGNITION: Overall cognitive status: Within functional limits for tasks assessed  SENSATION: WFL  POSTURE: rounded shoulders  PALPATION: Tight bands and trigger points in R > L upper traps and rhomboids   CERVICAL ROM:   Active ROM A/PROM (deg) eval  Flexion WNL  Extension WNL  Right lateral flexion WNL  Left lateral flexion WNL  Right rotation WNL  Left rotation WNL   (Blank rows = not tested)  UPPER EXTREMITY ROM:  .   UPPER EXTREMITY MMT: 5/5 throughout bilateral upper extremities but patient states that she "is going to be so sore" from the muscle testing throughout the test.   TODAY'S TREATMENT:      DATE:  04/17/23 Foam roll thoracic extension in quadruped 10x UBE L2 x 2 min forward, backward was more difficult but not painful Rows red Tband x 10 Shoulder Extension red x 10 quadruped alternating Y x 10  Manual therapy to bil lats, bil teres, bil upper traps,  bil levator scap Trigger Point Dry-Needling  Treatment instructions: Expect mild to moderate muscle soreness. S/S of  pneumothorax if dry needled over a lung field, and to seek immediate medical attention should they occur. Patient verbalized understanding of these instructions and education.  Patient Consent Given: Yes Education handout provided: Previously provided Muscles treated: right and left lats,right and left teres, bil upper traps, bil levator scap; right infraspinatus, right supraspinatus Electrical stimulation performed: No Parameters: N/A Treatment response/outcome: dec tender point size/number, twitch response and decreased muscle tension Moist heat supine 10 min at end of session  04/15/23 UBE L2 x 2 min forward, backward was painful Rows red Tband x 10 Extension red x 10 Horizontal ABD red x 10 ER with scap retraction red x 10 Prone T and Y x 10 (Y very hard) Manual therapy to right lats, right teres, bil upper traps, bil suboccipitals; bil levator scap Trigger Point Dry-Needling  Treatment instructions: Expect mild to moderate muscle soreness. S/S of pneumothorax if dry needled over a lung field, and to seek immediate medical attention should they occur. Patient verbalized understanding of these instructions and education.  Patient Consent Given: Yes Education handout provided: Previously provided Muscles treated: right lats,right teres, bil upper traps, bil cervical multifidi and T1 Electrical stimulation performed: No Parameters: N/A Treatment response/outcome: dec tender point size/number, twitch response and decreased muscle tension Moist heat supine 10 min at end of session         8/27 Given red band for home use with HEP Melt method for c-sp review Lying on foam roll vertically for muscle relaxation Discussed prioritizing sleep Discussed brain's role in pain Manual therapy to right lats, right teres, bil upper traps, bil suboccipitals; bil levator scap Trigger Point Dry-Needling  Treatment instructions: Expect mild to moderate muscle soreness. S/S of pneumothorax  if dry needled over a lung field, and to seek immediate medical attention should they occur. Patient verbalized understanding of these instructions and education.  Patient Consent Given: Yes Education handout provided: Previously provided Muscles treated: right rhomboids, right subscapularis; right lats,right teres, bil upper traps, bil suboccipitals; bil cervical multifidi Electrical stimulation performed: No Parameters: N/A Treatment response/outcome: dec tender point size/number  Moist heat supine 2 min        PATIENT EDUCATION:  Education details: initiated HEP Person educated: Patient Education method: Programmer, multimedia, Facilities manager, Verbal cues, and Handouts Education comprehension: verbalized understanding, returned demonstration, and verbal cues required  HOME EXERCISE PROGRAM: Access Code: [email or web address] URL: [email or web address] Date: 03/28/2023 Prepared by: Lavinia Sharps  Exercises - Supine Chest Stretch on Foam Roll  - 1 x daily - 7 x weekly - 3 sets - 10 reps - Thoracic Extension Mobilization on Foam Roll  - 1 x daily - 7 x weekly - 3 sets - 10 reps - Supine Shoulder Horizontal Abduction with Resistance  - 1 x daily - 7 x weekly - 3 sets - 10 reps - Supine PNF D2 Flexion with Resistance  - 1 x daily - 7 x weekly - 3 sets - 10 reps - Latissimus Dorsi Stretch at Wall  - 2 x daily - 7 x weekly - 1 sets - 2-3 reps - 20 hold - Prone Chest Stretch on Chair  - 1 x daily - 7 x weekly - 1 sets - 2-3 reps - 20 hold  ASSESSMENT:  CLINICAL IMPRESSION: Less painful overall today compared to previous visits.  Good response to low volume and low intensity exercise.  The patient had numerous muscle twitches produced during dry needling which is a good prognostic indicator for benefit.  Therapist monitoring response to all interventions and modifying treatment accordingly.      OBJECTIVE IMPAIRMENTS: increased fascial restrictions and increased muscle spasms.   ACTIVITY  LIMITATIONS: carrying, lifting, bending, sitting, squatting, sleeping, transfers, bed mobility, bathing, dressing, and hygiene/grooming  PARTICIPATION LIMITATIONS: meal prep, cleaning, laundry, interpersonal relationship, driving, shopping, community activity, and occupation  PERSONAL FACTORS: Education, Past/current experiences, and 3+ comorbidities: anxiety , depression and chronic pain  are also affecting patient's functional outcome.   REHAB POTENTIAL: Fair due to  CLINICAL DECISION MAKING: Unstable/unpredictable  EVALUATION COMPLEXITY: High   GOALS: Goals reviewed with patient? Yes  SHORT TERM GOALS: Target date: 04/24/2023   Patient to report pain no greater than 2/10  Baseline:  Goal status: INITIAL  2.  Patient will be independent with initial HEP  Baseline:  Goal status: INITIAL  \  LONG TERM GOALS: Target date: 05/22/2023   Patient to report pain no greater than 2/10  Baseline:  Goal status: INITIAL  2.  Patient to be independent with advanced HEP  Baseline:  Goal status: INITIAL  3.  Patient to be able to sleep through the night  Baseline:  Goal status: INITIAL  4.  Patient to be able to return to work Baseline:  Goal status: INITIAL  5.  Neck pain disablity score to improve by 5 Baseline:  Goal status: INITIAL  6.  Patient to report 85% improvement in overall symptoms  Baseline:  Goal status: INITIAL   PLAN:  PT FREQUENCY: 1-2x/week  PT DURATION: 8 weeks  PLANNED INTERVENTIONS: Therapeutic exercises, Therapeutic activity, Neuromuscular re-education, Balance training, Gait training, Patient/Family education, Self Care, Joint mobilization, Vestibular training, Canalith repositioning, Aquatic Therapy, Dry Needling, Electrical stimulation, Spinal mobilization, Cryotherapy, Moist heat, Taping, Vasopneumatic device, Traction, Ultrasound, Ionotophoresis 4mg /ml Dexamethasone, and Manual therapy  PLAN FOR NEXT SESSION:  DN;  UBE, review HEP,  progress postural strengthening including resistance training upper quarter  Lavinia Sharps, PT 04/17/23 8:55 PM Phone: [phone] Fax: [phone]

## 2023-04-23 ENCOUNTER — Ambulatory Visit: Payer: Managed Care, Other (non HMO) | Admitting: Physical Therapy

## 2023-04-23 DIAGNOSIS — M546 Pain in thoracic spine: Secondary | ICD-10-CM

## 2023-04-23 DIAGNOSIS — M542 Cervicalgia: Secondary | ICD-10-CM

## 2023-04-23 DIAGNOSIS — M6281 Muscle weakness (generalized): Secondary | ICD-10-CM

## 2023-04-23 NOTE — Therapy (Signed)
OUTPATIENT PHYSICAL THERAPY CERVICAL TREATMENT NOTE   Patient Name: Adrienne Williams MRN: [id number] DOB:05-Feb-1981, 42 y.o., female Today's Date: 04/23/2023  END OF SESSION:  PT End of Session - 04/23/23 1012     Visit Number 6    Date for PT Re-Evaluation 05/22/23    Authorization Type [employer]    PT Start Time 1015    PT Stop Time 1055    PT Time Calculation (min) 40 min    Activity Tolerance Patient tolerated treatment well              Past Medical History:  Diagnosis Date   Anxiety    Asthma    Depression    Family history of breast cancer    Frank breech presentation 05/29/2018   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   Hemorrhoids    Hx of varicella    PONV (postoperative nausea and vomiting)    "little nauseated"   Postpartum care following cesarean delivery (6/14) 01/18/2016   Past Surgical History:  Procedure Laterality Date   BREAST BIOPSY Right 02/25/2023   CESAREAN SECTION N/A 01/18/2016   Procedure: Primary CESAREAN SECTION;  Surgeon: Genia Del, MD;  Location: [facility];  Service: Obstetrics;  Laterality: N/A;  EDD: 01/25/16   CESAREAN SECTION N/A 05/29/2018   Procedure: Repeat CESAREAN SECTION;  Surgeon: Shea Evans, MD;  Location: [facility];  Service: Obstetrics;  Laterality: N/A;  EDD: 06/10/18   COLONOSCOPY     CYSTECTOMY  2002   pilonidal region   DIAGNOSTIC LAPAROSCOPY WITH REMOVAL OF ECTOPIC PREGNANCY Right 05/29/2020   Procedure: DIAGNOSTIC LAPAROSCOPY WITH REMOVAL OF ECTOPIC PREGNANCY;  Surgeon: Shea Evans, MD;  Location: [facility] OR;  Service: Gynecology;  Laterality: Right;   MOLE REMOVAL     UNILATERAL SALPINGECTOMY Right 05/29/2020   Procedure: UNILATERAL SALPINGECTOMY;  Surgeon: Shea Evans, MD;  Location: [facility] OR;  Service: Gynecology;  Laterality: Right;   WISDOM TOOTH EXTRACTION     Patient Active Problem List   Diagnosis Date Noted   Neck pain, chronic 06/27/2022   Fibromyalgia  06/27/2022   Myofascial pain 06/27/2022   Internal hemorrhoids 06/13/2022   Rectal bleeding 06/13/2022   Family history of breast cancer 01/18/2022   Monoallelic mutation of CHEK2 gene in female patient 01/18/2022   Anxiety and depression 05/31/2018   Rh negative, maternal 05/31/2018   Homero Fellers breech presentation 05/29/2018   Status post repeat low transverse cesarean section 05/29/2018   Postpartum care following cesarean delivery (10/24) 05/29/2018    PCP: Jarrett Soho, PA-C   REFERRING PROVIDER: Jackelyn Poling, DO  REFERRING DIAG: [phone] (ICD-10-CM) - Other muscle spasm  THERAPY DIAG:  Pain in thoracic spine  Cervicalgia  Muscle weakness (generalized)  Rationale for Evaluation and Treatment: Rehabilitation  ONSET DATE: 03/26/2023  SUBJECTIVE:  SUBJECTIVE STATEMENT: I need some things to do in the moment (while working).  Pain with fatigue while walking and packing for trip to [place].     EVAL: Patient reports she was helping her husband install garage seal on 03-10-23.  She felt severe pain in thoracic spine and neck onset around 03-13-23 and explains that she noticed that she had a lot of swelling and had to get a bigger bra and extender for her bra due to the swelling.  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  Difficulty with driving, sleeping, IADL's self care and working.  I have two kids and caring for them and the dogs has been very challenging.  I am usually very active but unable to do anything right now.  Hand dominance: Right  PERTINENT HISTORY:  na  PAIN:  PAIN:  Are you having pain? Yes NPRS scale: ranges 0-5/10 Pain location: right > left mid back pain, neck pain  Aggravating factors: stress; overstretching Relieving  factors: DN   PRECAUTIONS: None  RED FLAGS: None     WEIGHT BEARING RESTRICTIONS: No  FALLS:  Has patient fallen in last 6 months? No  LIVING ENVIRONMENT: Lives with: lives with their family and lives with their spouse Lives in: House/apartment   OCCUPATION: Pharmacist  PLOF: Independent, Independent with basic ADLs, Independent with household mobility without device, Independent with community mobility without device, Independent with homemaking with ambulation, Independent with gait, and Independent with transfers  PATIENT GOALS: to eliminate pain and be able to work and do my usual daily activities and sleep without interruption  NEXT MD VISIT: 04/10/23  OBJECTIVE:   DIAGNOSTIC FINDINGS:  MRI showed small lesion 4 mm breast and had biopsy in July; normal but recommended another MRI and biopsy   PATIENT SURVEYS:  NDI 80% self disability score  COGNITION: Overall cognitive status: Within functional limits for tasks assessed  SENSATION: WFL  POSTURE: rounded shoulders  PALPATION: Tight bands and trigger points in R > L upper traps and rhomboids   CERVICAL ROM:   Active ROM A/PROM (deg) eval  Flexion WNL  Extension WNL  Right lateral flexion WNL  Left lateral flexion WNL  Right rotation WNL  Left rotation WNL   (Blank rows = not tested)  UPPER EXTREMITY ROM:  .   UPPER EXTREMITY MMT: 5/5 throughout bilateral upper extremities but patient states that she "is going to be so sore" from the muscle testing throughout the test.   TODAY'S TREATMENT:      DATE:  04/23/23 Blue band rows 10x Green band shoulder extension 10x Counter push up 5x (challenging) 2nd set feet closer to counter Wall push up 10x Open books 1/2 kneel next to the wall 5x Cervical retractions 5x Back to wall with lacrosse ball pinned to levator scap with UE elevation, horizontal adduction and cervical rotation Overhead press 5# 5x right/left Squats on corner of table with 5#  dumbbells at shoulders 10x Dead lifts to knee level with 5# dumbbells 10x Farmers carry 5# dumbbells with BOSU toe tap   DATE:  04/17/23 Foam roll thoracic extension in quadruped 10x UBE L2 x 2 min forward, backward was more difficult but not painful Rows red Tband x 10 Shoulder Extension red x 10 quadruped alternating Y x 10  Manual therapy to bil lats, bil teres, bil upper traps,  bil levator scap Trigger Point Dry-Needling  Treatment instructions: Expect mild to moderate muscle soreness. S/S of  pneumothorax if dry needled over a lung field, and to seek immediate medical attention should they occur. Patient verbalized understanding of these instructions and education.  Patient Consent Given: Yes Education handout provided: Previously provided Muscles treated: right and left lats,right and left teres, bil upper traps, bil levator scap; right infraspinatus, right supraspinatus Electrical stimulation performed: No Parameters: N/A Treatment response/outcome: dec tender point size/number, twitch response and decreased muscle tension Moist heat supine 10 min at end of session         04/15/23 UBE L2 x 2 min forward, backward was painful Rows red Tband x 10 Extension red x 10 Horizontal ABD red x 10 ER with scap retraction red x 10 Prone T and Y x 10 (Y very hard) Manual therapy to right lats, right teres, bil upper traps, bil suboccipitals; bil levator scap Trigger Point Dry-Needling  Treatment instructions: Expect mild to moderate muscle soreness. S/S of pneumothorax if dry needled over a lung field, and to seek immediate medical attention should they occur. Patient verbalized understanding of these instructions and education.  Patient Consent Given: Yes Education handout provided: Previously provided Muscles treated: right lats,right teres, bil upper traps, bil cervical multifidi and T1 Electrical stimulation performed: No Parameters: N/A Treatment response/outcome: dec tender  point size/number, twitch response and decreased muscle tension Moist heat supine 10 min at end of session         8/27 Given red band for home use with HEP Melt method for c-sp review Lying on foam roll vertically for muscle relaxation Discussed prioritizing sleep Discussed brain's role in pain Manual therapy to right lats, right teres, bil upper traps, bil suboccipitals; bil levator scap Trigger Point Dry-Needling  Treatment instructions: Expect mild to moderate muscle soreness. S/S of pneumothorax if dry needled over a lung field, and to seek immediate medical attention should they occur. Patient verbalized understanding of these instructions and education.  Patient Consent Given: Yes Education handout provided: Previously provided Muscles treated: right rhomboids, right subscapularis; right lats,right teres, bil upper traps, bil suboccipitals; bil cervical multifidi Electrical stimulation performed: No Parameters: N/A Treatment response/outcome: dec tender point size/number  Moist heat supine 2 min        PATIENT EDUCATION:  Education details: initiated HEP Person educated: Patient Education method: Programmer, multimedia, Facilities manager, Verbal cues, and Handouts Education comprehension: verbalized understanding, returned demonstration, and verbal cues required  HOME EXERCISE PROGRAM: Access Code: [email or web address] URL: [email or web address] Date: 04/23/2023 Prepared by: Lavinia Sharps  Exercises - Supine Chest Stretch on Foam Roll  - 1 x daily - 7 x weekly - 3 sets - 10 reps - Thoracic Extension Mobilization on Foam Roll  - 1 x daily - 7 x weekly - 3 sets - 10 reps - Supine Shoulder Horizontal Abduction with Resistance  - 1 x daily - 7 x weekly - 3 sets - 10 reps - Supine PNF D2 Flexion with Resistance  - 1 x daily - 7 x weekly - 3 sets - 10 reps - Latissimus Dorsi Stretch at Wall  - 2 x daily - 7 x weekly - 1 sets - 2-3 reps - 20 hold - Prone Chest Stretch on Chair  - 1 x daily  - 7 x weekly - 1 sets - 2-3 reps - 20 hold - Standing Thoracic Open Book at Wall  - 1 x daily - 7 x weekly - 1 sets - 5 reps - Standing Cervical Retraction  - 3-4 x daily - 7 x weekly - 1 sets - 5  reps - Wall Push Up  - 1 x daily - 7 x weekly - 1 sets - 10 reps - Wall Angels  - 1 x daily - 7 x weekly - 1 sets - 10 reps - Shoulder Overhead Press in Flexion with Dumbbells  - 1 x daily - 7 x weekly - 2 sets - 5 reps - Squat with Chair Touch  - 1 x daily - 7 x weekly - 2 sets - 5 reps - Half Deadlift with Kettlebell  - 1 x daily - 7 x weekly - 2 sets - 5 reps - Farmer's Carry with Kettlebells  - 1 x daily - 7 x weekly - 1 sets - 10 reps - Standing Hip Circles  - 1 x daily - 7 x weekly - 1 sets - 10 reps -ASSESSMENT:  CLINICAL IMPRESSION: Discussed ex's she could do when she returns to work this weekend.  Good response to all using low volume/repetition number.  She is most challenged by counter push ups.  Therapist progressing and updating HEP for increased intensity and challenge level for further strengthening and functional mobility.      OBJECTIVE IMPAIRMENTS: increased fascial restrictions and increased muscle spasms.   ACTIVITY LIMITATIONS: carrying, lifting, bending, sitting, squatting, sleeping, transfers, bed mobility, bathing, dressing, and hygiene/grooming  PARTICIPATION LIMITATIONS: meal prep, cleaning, laundry, interpersonal relationship, driving, shopping, community activity, and occupation  PERSONAL FACTORS: Education, Past/current experiences, and 3+ comorbidities: anxiety , depression and chronic pain  are also affecting patient's functional outcome.   REHAB POTENTIAL: Fair due to   CLINICAL DECISION MAKING: Unstable/unpredictable  EVALUATION COMPLEXITY: High   GOALS: Goals reviewed with patient? Yes  SHORT TERM GOALS: Target date: 04/24/2023   Patient to report pain no greater than 2/10  Baseline:  Goal status: INITIAL  2.  Patient will be independent with  initial HEP  Baseline:  Goal status: met 9/17  \  LONG TERM GOALS: Target date: 05/22/2023   Patient to report pain no greater than 2/10  Baseline:  Goal status: INITIAL  2.  Patient to be independent with advanced HEP  Baseline:  Goal status: INITIAL  3.  Patient to be able to sleep through the night  Baseline:  Goal status: INITIAL  4.  Patient to be able to return to work Baseline:  Goal status: INITIAL  5.  Neck pain disablity score to improve by 5 Baseline:  Goal status: INITIAL  6.  Patient to report 85% improvement in overall symptoms  Baseline:  Goal status: INITIAL   PLAN:  PT FREQUENCY: 1-2x/week  PT DURATION: 8 weeks  PLANNED INTERVENTIONS: Therapeutic exercises, Therapeutic activity, Neuromuscular re-education, Balance training, Gait training, Patient/Family education, Self Care, Joint mobilization, Vestibular training, Canalith repositioning, Aquatic Therapy, Dry Needling, Electrical stimulation, Spinal mobilization, Cryotherapy, Moist heat, Taping, Vasopneumatic device, Traction, Ultrasound, Ionotophoresis 4mg /ml Dexamethasone, and Manual therapy  PLAN FOR NEXT SESSION:  returns to work Saturday; DN;  UBE, review HEP, progress postural strengthening including resistance training upper quarter Lavinia Sharps, PT 04/23/23 8:25 PM Phone: [phone] Fax: [phone]

## 2023-04-25 ENCOUNTER — Ambulatory Visit: Payer: Managed Care, Other (non HMO) | Admitting: Physical Therapy

## 2023-04-25 DIAGNOSIS — M546 Pain in thoracic spine: Secondary | ICD-10-CM | POA: Diagnosis not present

## 2023-04-25 DIAGNOSIS — M6281 Muscle weakness (generalized): Secondary | ICD-10-CM

## 2023-04-25 DIAGNOSIS — M542 Cervicalgia: Secondary | ICD-10-CM

## 2023-04-25 NOTE — Therapy (Signed)
OUTPATIENT PHYSICAL THERAPY CERVICAL TREATMENT NOTE   Patient Name: Adrienne Williams MRN: [id number] DOB:Jun 15, 1981, 42 y.o., female Today's Date: 04/25/2023  END OF SESSION:  PT End of Session - 04/25/23 0842     Visit Number 7    Date for PT Re-Evaluation 05/22/23    Authorization Type [employer]    PT Start Time 0845    PT Stop Time 0925    PT Time Calculation (min) 40 min    Activity Tolerance Patient tolerated treatment well              Past Medical History:  Diagnosis Date   Anxiety    Asthma    Depression    Family history of breast cancer    Homero Fellers breech presentation 05/29/2018   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   Hemorrhoids    Hx of varicella    PONV (postoperative nausea and vomiting)    "little nauseated"   Postpartum care following cesarean delivery (6/14) 01/18/2016   Past Surgical History:  Procedure Laterality Date   BREAST BIOPSY Right 02/25/2023   CESAREAN SECTION N/A 01/18/2016   Procedure: Primary CESAREAN SECTION;  Surgeon: Genia Del, MD;  Location: [facility];  Service: Obstetrics;  Laterality: N/A;  EDD: 01/25/16   CESAREAN SECTION N/A 05/29/2018   Procedure: Repeat CESAREAN SECTION;  Surgeon: Shea Evans, MD;  Location: [facility];  Service: Obstetrics;  Laterality: N/A;  EDD: 06/10/18   COLONOSCOPY     CYSTECTOMY  2002   pilonidal region   DIAGNOSTIC LAPAROSCOPY WITH REMOVAL OF ECTOPIC PREGNANCY Right 05/29/2020   Procedure: DIAGNOSTIC LAPAROSCOPY WITH REMOVAL OF ECTOPIC PREGNANCY;  Surgeon: Shea Evans, MD;  Location: [facility] OR;  Service: Gynecology;  Laterality: Right;   MOLE REMOVAL     UNILATERAL SALPINGECTOMY Right 05/29/2020   Procedure: UNILATERAL SALPINGECTOMY;  Surgeon: Shea Evans, MD;  Location: [facility] OR;  Service: Gynecology;  Laterality: Right;   WISDOM TOOTH EXTRACTION     Patient Active Problem List   Diagnosis Date Noted   Neck pain, chronic 06/27/2022   Fibromyalgia  06/27/2022   Myofascial pain 06/27/2022   Internal hemorrhoids 06/13/2022   Rectal bleeding 06/13/2022   Family history of breast cancer 01/18/2022   Monoallelic mutation of CHEK2 gene in female patient 01/18/2022   Anxiety and depression 05/31/2018   Rh negative, maternal 05/31/2018   Homero Fellers breech presentation 05/29/2018   Status post repeat low transverse cesarean section 05/29/2018   Postpartum care following cesarean delivery (10/24) 05/29/2018    PCP: Jarrett Soho, PA-C   REFERRING PROVIDER: Jackelyn Poling, DO  REFERRING DIAG: [phone] (ICD-10-CM) - Other muscle spasm  THERAPY DIAG:  Pain in thoracic spine  Cervicalgia  Muscle weakness (generalized)  Rationale for Evaluation and Treatment: Rehabilitation  ONSET DATE: 03/26/2023  SUBJECTIVE:  SUBJECTIVE STATEMENT: I'm having trouble getting comfortable in the car.  I was also in front of the computer yesterday.  I'm really tight all through my chest.  Horseback riding on Tuesday was a good core workout.  The doctor was not concerned by tachycardia but waiting for a monitor.  Did 15 min of running and it helped so much.  Plan to do every other other day.    EVAL: Patient reports she was helping her husband install garage seal on 03-10-23.  She felt severe pain in thoracic spine and neck onset around 03-13-23 and explains that she noticed that she had a lot of swelling and had to get a bigger bra and extender for her bra due to the swelling.  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  Difficulty with driving, sleeping, IADL's self care and working.  I have two kids and caring for them and the dogs has been very challenging.  I am usually very active but unable to do anything right now.  Hand  dominance: Right  PERTINENT HISTORY:  na  PAIN:  PAIN:  Are you having pain? Yes NPRS scale: ranges ranges 0-5/10 Pain location: bil neck pain; bil suboccipitals Aggravating factors: stress; overstretching Relieving factors: DN   PRECAUTIONS: None  RED FLAGS: None     WEIGHT BEARING RESTRICTIONS: No  FALLS:  Has patient fallen in last 6 months? No  LIVING ENVIRONMENT: Lives with: lives with their family and lives with their spouse Lives in: House/apartment   OCCUPATION: Pharmacist  PLOF: Independent, Independent with basic ADLs, Independent with household mobility without device, Independent with community mobility without device, Independent with homemaking with ambulation, Independent with gait, and Independent with transfers  PATIENT GOALS: to eliminate pain and be able to work and do my usual daily activities and sleep without interruption  NEXT MD VISIT: 04/10/23  OBJECTIVE:   DIAGNOSTIC FINDINGS:  MRI showed small lesion 4 mm breast and had biopsy in July; normal but recommended another MRI and biopsy   PATIENT SURVEYS:  NDI 80% self disability score  COGNITION: Overall cognitive status: Within functional limits for tasks assessed  SENSATION: WFL  POSTURE: rounded shoulders  PALPATION: Tight bands and trigger points in R > L upper traps and rhomboids   CERVICAL ROM:   Active ROM A/PROM (deg) eval  Flexion WNL  Extension WNL  Right lateral flexion WNL  Left lateral flexion WNL  Right rotation WNL  Left rotation WNL   (Blank rows = not tested)   UPPER EXTREMITY MMT: 5/5 throughout bilateral upper extremities but patient states that she "is going to be so sore" from the muscle testing throughout the test.   TODAY'S TREATMENT:      DATE: 04/25/23 UBE 2 forward/backward Cable row 10# 10x (difficult) Lat bar standing 25# 10x Band green over top of the door lat pulls(given for home)  Towel SNAG rotation 10x right/left Towel assisted cervical  extension 5x Diaphragmatic breathing to decrease accessory anterior neck muscle overuse Manual therapy to bil lats, bil teres, bil upper traps,  bil levator scap Trigger Point Dry-Needling  Treatment instructions: Expect mild to moderate muscle soreness. S/S of pneumothorax if dry needled over a lung field, and to seek immediate medical attention should they occur. Patient verbalized understanding of these instructions and education.  Patient Consent Given: Yes Education handout provided: Previously provided Muscles treated: right  lats,right teres, bil upper traps, bil levator scap;  Electrical stimulation  performed: No Parameters: N/A Treatment response/outcome: dec tender point size/number, twitch response and decreased muscle tension 04/23/23 Blue band rows 10x Green band shoulder extension 10x Counter push up 5x (challenging) 2nd set feet closer to counter Wall push up 10x Open books 1/2 kneel next to the wall 5x Cervical retractions 5x Back to wall with lacrosse ball pinned to levator scap with UE elevation, horizontal adduction and cervical rotation Overhead press 5# 5x right/left Squats on corner of table with 5# dumbbells at shoulders 10x Dead lifts to knee level with 5# dumbbells 10x Farmers carry 5# dumbbells with BOSU toe tap   DATE:  04/17/23 Foam roll thoracic extension in quadruped 10x UBE L2 x 2 min forward, backward was more difficult but not painful Rows red Tband x 10 Shoulder Extension red x 10 quadruped alternating Y x 10  Manual therapy to bil lats, bil teres, bil upper traps,  bil levator scap Trigger Point Dry-Needling  Treatment instructions: Expect mild to moderate muscle soreness. S/S of pneumothorax if dry needled over a lung field, and to seek immediate medical attention should they occur. Patient verbalized understanding of these instructions and education.  Patient Consent Given: Yes Education handout provided: Previously provided Muscles treated:  right and left lats,right and left teres, bil upper traps, bil levator scap; right infraspinatus, right supraspinatus Electrical stimulation performed: No Parameters: N/A Treatment response/outcome: dec tender point size/number, twitch response and decreased muscle tension Moist heat supine 10 min at end of session                 PATIENT EDUCATION:  Education details: initiated HEP Person educated: Patient Education method: Programmer, multimedia, Facilities manager, Verbal cues, and Handouts Education comprehension: verbalized understanding, returned demonstration, and verbal cues required  HOME EXERCISE PROGRAM: Access Code: [email or web address] URL: [email or web address] Date: 04/25/2023 Prepared by: Lavinia Sharps  Exercises - Supine Chest Stretch on Foam Roll  - 1 x daily - 7 x weekly - 3 sets - 10 reps - Thoracic Extension Mobilization on Foam Roll  - 1 x daily - 7 x weekly - 3 sets - 10 reps - Supine Shoulder Horizontal Abduction with Resistance  - 1 x daily - 7 x weekly - 3 sets - 10 reps - Supine PNF D2 Flexion with Resistance  - 1 x daily - 7 x weekly - 3 sets - 10 reps - Latissimus Dorsi Stretch at Wall  - 2 x daily - 7 x weekly - 1 sets - 2-3 reps - 20 hold - Prone Chest Stretch on Chair  - 1 x daily - 7 x weekly - 1 sets - 2-3 reps - 20 hold - Standing Thoracic Open Book at Wall  - 1 x daily - 7 x weekly - 1 sets - 5 reps - Standing Cervical Retraction  - 3-4 x daily - 7 x weekly - 1 sets - 5 reps - Wall Push Up  - 1 x daily - 7 x weekly - 1 sets - 10 reps - Wall Angels  - 1 x daily - 7 x weekly - 1 sets - 10 reps - Shoulder Overhead Press in Flexion with Dumbbells  - 1 x daily - 7 x weekly - 2 sets - 5 reps - Squat with Chair Touch  - 1 x daily - 7 x weekly - 2 sets - 5 reps - Half Deadlift with Kettlebell  - 1 x daily - 7 x weekly - 2 sets - 5 reps - Farmer's Carry with Kettlebells  -  1 x daily - 7 x weekly - 1 sets - 10 reps - Standing Hip Circles  - 1 x daily - 7 x weekly - 1  sets - 10 reps - Seated Assisted Cervical Rotation with Towel  - 1 x daily - 7 x weekly - 1 sets - 10 reps - Cervical Extension AROM with Strap  - 1 x daily - 7 x weekly - 1 sets - 10 reps - Supine Diaphragmatic Breathing  - 1 x daily - 7 x weekly - 1 sets - 10 reps -ASSESSMENT:  CLINICAL IMPRESSION: Therapist progressing and updating HEP including strategies for work to help with pain control.  Patient is challenged by lat pulls and backwards direction on UBE but improved since start of care.  The patient had numerous muscle twitches produced during dry needling which is a good prognostic indicator for benefit.  Therapist monitoring response to all interventions and modifying treatment accordingly.       OBJECTIVE IMPAIRMENTS: increased fascial restrictions and increased muscle spasms.   ACTIVITY LIMITATIONS: carrying, lifting, bending, sitting, squatting, sleeping, transfers, bed mobility, bathing, dressing, and hygiene/grooming  PARTICIPATION LIMITATIONS: meal prep, cleaning, laundry, interpersonal relationship, driving, shopping, community activity, and occupation  PERSONAL FACTORS: Education, Past/current experiences, and 3+ comorbidities: anxiety , depression and chronic pain  are also affecting patient's functional outcome.   REHAB POTENTIAL: Fair due to   CLINICAL DECISION MAKING: Unstable/unpredictable  EVALUATION COMPLEXITY: High   GOALS: Goals reviewed with patient? Yes  SHORT TERM GOALS: Target date: 04/24/2023   Patient to report pain no greater than 2/10  Baseline:  Goal status: INITIAL  2.  Patient will be independent with initial HEP  Baseline:  Goal status: met 9/17  \  LONG TERM GOALS: Target date: 05/22/2023   Patient to report pain no greater than 2/10  Baseline:  Goal status: INITIAL  2.  Patient to be independent with advanced HEP  Baseline:  Goal status: INITIAL  3.  Patient to be able to sleep through the night  Baseline:  Goal status:  INITIAL  4.  Patient to be able to return to work Baseline:  Goal status: INITIAL  5.  Neck pain disablity score to improve by 5 Baseline:  Goal status: INITIAL  6.  Patient to report 85% improvement in overall symptoms  Baseline:  Goal status: INITIAL   PLAN:  PT FREQUENCY: 1-2x/week  PT DURATION: 8 weeks  PLANNED INTERVENTIONS: Therapeutic exercises, Therapeutic activity, Neuromuscular re-education, Balance training, Gait training, Patient/Family education, Self Care, Joint mobilization, Vestibular training, Canalith repositioning, Aquatic Therapy, Dry Needling, Electrical stimulation, Spinal mobilization, Cryotherapy, Moist heat, Taping, Vasopneumatic device, Traction, Ultrasound, Ionotophoresis 4mg /ml Dexamethasone, and Manual therapy  PLAN FOR NEXT SESSION:  returns to work Saturday; DN;  UBE, review HEP, progress postural strengthening including resistance training upper quarter  Lavinia Sharps, PT 04/25/23 6:14 PM Phone: [phone] Fax: [phone]

## 2023-04-30 ENCOUNTER — Ambulatory Visit: Payer: Managed Care, Other (non HMO) | Attending: Family Medicine

## 2023-04-30 ENCOUNTER — Other Ambulatory Visit: Payer: Self-pay | Admitting: *Deleted

## 2023-04-30 ENCOUNTER — Ambulatory Visit: Payer: Managed Care, Other (non HMO) | Admitting: Physical Therapy

## 2023-04-30 DIAGNOSIS — R000 Tachycardia, unspecified: Secondary | ICD-10-CM

## 2023-04-30 DIAGNOSIS — M546 Pain in thoracic spine: Secondary | ICD-10-CM | POA: Diagnosis not present

## 2023-04-30 DIAGNOSIS — M542 Cervicalgia: Secondary | ICD-10-CM

## 2023-04-30 DIAGNOSIS — R002 Palpitations: Secondary | ICD-10-CM

## 2023-04-30 DIAGNOSIS — M6281 Muscle weakness (generalized): Secondary | ICD-10-CM

## 2023-04-30 NOTE — Progress Notes (Unsigned)
Enrolled for [employer] to mail a ZIO XT long term holter monitor to the patients address on file.   DOD to read. 

## 2023-04-30 NOTE — Therapy (Signed)
OUTPATIENT PHYSICAL THERAPY CERVICAL TREATMENT NOTE   Patient Name: Adrienne Williams MRN: [id number] DOB:1980-11-25, 42 y.o., female Today's Date: 04/30/2023  END OF SESSION:  PT End of Session - 04/30/23 0847     Visit Number 8    Date for PT Re-Evaluation 05/22/23    Authorization Type [employer]    PT Start Time [phone]    PT Stop Time 0930    PT Time Calculation (min) 43 min    Activity Tolerance Patient tolerated treatment well              Past Medical History:  Diagnosis Date   Anxiety    Asthma    Depression    Family history of breast cancer    Homero Fellers breech presentation 05/29/2018   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   Hemorrhoids    Hx of varicella    PONV (postoperative nausea and vomiting)    "little nauseated"   Postpartum care following cesarean delivery (6/14) 01/18/2016   Past Surgical History:  Procedure Laterality Date   BREAST BIOPSY Right 02/25/2023   CESAREAN SECTION N/A 01/18/2016   Procedure: Primary CESAREAN SECTION;  Surgeon: Genia Del, MD;  Location: [facility];  Service: Obstetrics;  Laterality: N/A;  EDD: 01/25/16   CESAREAN SECTION N/A 05/29/2018   Procedure: Repeat CESAREAN SECTION;  Surgeon: Shea Evans, MD;  Location: [facility];  Service: Obstetrics;  Laterality: N/A;  EDD: 06/10/18   COLONOSCOPY     CYSTECTOMY  2002   pilonidal region   DIAGNOSTIC LAPAROSCOPY WITH REMOVAL OF ECTOPIC PREGNANCY Right 05/29/2020   Procedure: DIAGNOSTIC LAPAROSCOPY WITH REMOVAL OF ECTOPIC PREGNANCY;  Surgeon: Shea Evans, MD;  Location: [facility] OR;  Service: Gynecology;  Laterality: Right;   MOLE REMOVAL     UNILATERAL SALPINGECTOMY Right 05/29/2020   Procedure: UNILATERAL SALPINGECTOMY;  Surgeon: Shea Evans, MD;  Location: [facility] OR;  Service: Gynecology;  Laterality: Right;   WISDOM TOOTH EXTRACTION     Patient Active Problem List   Diagnosis Date Noted   Neck pain, chronic 06/27/2022   Fibromyalgia  06/27/2022   Myofascial pain 06/27/2022   Internal hemorrhoids 06/13/2022   Rectal bleeding 06/13/2022   Family history of breast cancer 01/18/2022   Monoallelic mutation of CHEK2 gene in female patient 01/18/2022   Anxiety and depression 05/31/2018   Rh negative, maternal 05/31/2018   Homero Fellers breech presentation 05/29/2018   Status post repeat low transverse cesarean section 05/29/2018   Postpartum care following cesarean delivery (10/24) 05/29/2018    PCP: Jarrett Soho, PA-C   REFERRING PROVIDER: Jackelyn Poling, DO  REFERRING DIAG: [phone] (ICD-10-CM) - Other muscle spasm  THERAPY DIAG:  Pain in thoracic spine  Cervicalgia  Muscle weakness (generalized)  Rationale for Evaluation and Treatment: Rehabilitation  ONSET DATE: 03/26/2023  SUBJECTIVE:  SUBJECTIVE STATEMENT: I'm tired from working 2 weekend day.  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  Working again 11 hours Wed and Thursday.  Going horseback riding today.    EVAL: Patient reports she was helping her husband install garage seal on 03-10-23.  She felt severe pain in thoracic spine and neck onset around 03-13-23 and explains that she noticed that she had a lot of swelling and had to get a bigger bra and extender for her bra due to the swelling.  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  Difficulty with driving, sleeping, IADL's self care and working.  I have two kids and caring for them and the dogs has been very challenging.  I am usually very active but unable to do anything right now.  Hand dominance: Right  PERTINENT HISTORY:  na  PAIN:  PAIN:  Are you having pain? Yes NPRS scale: ranges ranges 0-5/10 Pain location: bil neck pain; tightness in  pectorals Aggravating factors: stress; overstretching Relieving factors: DN   PRECAUTIONS: None  RED FLAGS: None     WEIGHT BEARING RESTRICTIONS: No  FALLS:  Has patient fallen in last 6 months? No  LIVING ENVIRONMENT: Lives with: lives with their family and lives with their spouse Lives in: House/apartment   OCCUPATION: Pharmacist  PLOF: Independent, Independent with basic ADLs, Independent with household mobility without device, Independent with community mobility without device, Independent with homemaking with ambulation, Independent with gait, and Independent with transfers  PATIENT GOALS: to eliminate pain and be able to work and do my usual daily activities and sleep without interruption  NEXT MD VISIT: 04/10/23  OBJECTIVE:   DIAGNOSTIC FINDINGS:  MRI showed small lesion 4 mm breast and had biopsy in July; normal but recommended another MRI and biopsy   PATIENT SURVEYS:  NDI 80% self disability score  COGNITION: Overall cognitive status: Within functional limits for tasks assessed  SENSATION: WFL  POSTURE: rounded shoulders  PALPATION: Tight bands and trigger points in R > L upper traps and rhomboids   CERVICAL ROM:   Active ROM A/PROM (deg) eval  Flexion WNL  Extension WNL  Right lateral flexion WNL  Left lateral flexion WNL  Right rotation WNL  Left rotation WNL   (Blank rows = not tested)   UPPER EXTREMITY MMT: 5/5 throughout bilateral upper extremities but patient states that she "is going to be so sore" from the muscle testing throughout the test.   TODAY'S TREATMENT:      04/30/23 Blue band rows 10x2 Doorway pec stretch static hold 20 sec 2 sets of 3 at 3 hand levels (low medium and high) Triceps 20# 5x (very challenging)  Doorway lat stretch 20 sec 2 sets Green band lat pulls 10x2 Wall push up 10x Overhead press 5# 5x right/left Sit to stand with 5# dumbbells overhead press 5x Squats holding 5# dumbbells at shoulders 5x Dead lifts  to knee level with 5# dumbbells 10x Overhead hold 5# dumbbell with BOSU toe tap 10x Shrugs 7# dumbbells bil 10x  Cervical retractions 5x  DATE: 04/25/23 UBE 2 forward/backward Cable row 10# 10x (difficult) Lat bar standing 25# 10x Band green over top of the door lat pulls(given for home)  Towel SNAG rotation 10x right/left Towel assisted cervical extension 5x Diaphragmatic breathing to decrease accessory anterior neck muscle overuse Manual therapy to bil lats, bil teres, bil upper traps,  bil levator scap Trigger Point Dry-Needling  Treatment instructions: Expect mild to  moderate muscle soreness. S/S of pneumothorax if dry needled over a lung field, and to seek immediate medical attention should they occur. Patient verbalized understanding of these instructions and education.  Patient Consent Given: Yes Education handout provided: Previously provided Muscles treated: right  lats,right teres, bil upper traps, bil levator scap;  Electrical stimulation performed: No Parameters: N/A Treatment response/outcome: dec tender point size/number, twitch response and decreased muscle tension 04/23/23 Blue band rows 10x Green band shoulder extension 10x Counter push up 5x (challenging) 2nd set feet closer to counter Wall push up 10x Open books 1/2 kneel next to the wall 5x Cervical retractions 5x Back to wall with lacrosse ball pinned to levator scap with UE elevation, horizontal adduction and cervical rotation Overhead press 5# 5x right/left Squats on corner of table with 5# dumbbells at shoulders 10x Dead lifts to knee level with 5# dumbbells 10x Farmers carry 5# dumbbells with BOSU toe tap         PATIENT EDUCATION:  Education details: initiated HEP Person educated: Patient Education method: Programmer, multimedia, Facilities manager, Verbal cues, and Handouts Education comprehension: verbalized understanding, returned demonstration, and verbal cues required  HOME EXERCISE PROGRAM: Access Code:  [email or web address] URL: [email or web address] Date: 04/30/2023 Prepared by: Lavinia Sharps  Exercises - Supine Chest Stretch on Foam Roll  - 1 x daily - 7 x weekly - 3 sets - 10 reps - Thoracic Extension Mobilization on Foam Roll  - 1 x daily - 7 x weekly - 3 sets - 10 reps - Supine Shoulder Horizontal Abduction with Resistance  - 1 x daily - 7 x weekly - 3 sets - 10 reps - Supine PNF D2 Flexion with Resistance  - 1 x daily - 7 x weekly - 3 sets - 10 reps - Latissimus Dorsi Stretch at Wall  - 2 x daily - 7 x weekly - 1 sets - 2-3 reps - 20 hold - Prone Chest Stretch on Chair  - 1 x daily - 7 x weekly - 1 sets - 2-3 reps - 20 hold - Standing Thoracic Open Book at Wall  - 1 x daily - 7 x weekly - 1 sets - 5 reps - Standing Cervical Retraction  - 3-4 x daily - 7 x weekly - 1 sets - 5 reps - Wall Push Up  - 1 x daily - 7 x weekly - 1 sets - 10 reps - Wall Angels  - 1 x daily - 7 x weekly - 1 sets - 10 reps - Shoulder Overhead Press in Flexion with Dumbbells  - 1 x daily - 7 x weekly - 2 sets - 5 reps - Squat with Chair Touch  - 1 x daily - 7 x weekly - 2 sets - 5 reps - Half Deadlift with Kettlebell  - 1 x daily - 7 x weekly - 2 sets - 5 reps - Farmer's Carry with Kettlebells  - 1 x daily - 7 x weekly - 1 sets - 10 reps - Standing Hip Circles  - 1 x daily - 7 x weekly - 1 sets - 10 reps - Seated Assisted Cervical Rotation with Towel  - 1 x daily - 7 x weekly - 1 sets - 10 reps - Cervical Extension AROM with Strap  - 1 x daily - 7 x weekly - 1 sets - 10 reps - Supine Diaphragmatic Breathing  - 1 x daily - 7 x weekly - 1 sets - 10 reps - Doorway Pec Stretch at 90 Degrees Abduction  -  1 x daily - 7 x weekly - 2 sets - 2 reps - 20 hold - Doorway Pec Stretch at 120 Degrees Abduction  - 1 x daily - 7 x weekly - 2 sets - 2 reps - 20 hold - Standing Quadratus Lumborum Stretch with Doorway  - 1 x daily - 7 x weekly - 2 sets - 2 reps - 20 hold -ASSESSMENT:  CLINICAL IMPRESSION: Updated HEP to  include doorway stretching of pectorals and lats/quadratus lumborum muscles.  The patient reports feeling tired after returning to work but overall her symptoms were manageable.  We continue to discuss and try exercises that she can use at work to help with her long shifts.  Therapist monitoring response and providing cues to encourage scapular retraction and depression and to avoid compensatory strategies.       OBJECTIVE IMPAIRMENTS: increased fascial restrictions and increased muscle spasms.   ACTIVITY LIMITATIONS: carrying, lifting, bending, sitting, squatting, sleeping, transfers, bed mobility, bathing, dressing, and hygiene/grooming  PARTICIPATION LIMITATIONS: meal prep, cleaning, laundry, interpersonal relationship, driving, shopping, community activity, and occupation  PERSONAL FACTORS: Education, Past/current experiences, and 3+ comorbidities: anxiety , depression and chronic pain  are also affecting patient's functional outcome.   REHAB POTENTIAL: Fair due to   CLINICAL DECISION MAKING: Unstable/unpredictable  EVALUATION COMPLEXITY: High   GOALS: Goals reviewed with patient? Yes  SHORT TERM GOALS: Target date: 04/24/2023   Patient to report pain no greater than 2/10  Baseline:  Goal status: INITIAL  2.  Patient will be independent with initial HEP  Baseline:  Goal status: met 9/17  \  LONG TERM GOALS: Target date: 05/22/2023   Patient to report pain no greater than 2/10  Baseline:  Goal status: INITIAL  2.  Patient to be independent with advanced HEP  Baseline:  Goal status: INITIAL  3.  Patient to be able to sleep through the night  Baseline:  Goal status: INITIAL  4.  Patient to be able to return to work Baseline:  Goal status: INITIAL  5.  Neck pain disablity score to improve by 5 Baseline:  Goal status: INITIAL  6.  Patient to report 85% improvement in overall symptoms  Baseline:  Goal status: INITIAL   PLAN:  PT FREQUENCY:  1-2x/week  PT DURATION: 8 weeks  PLANNED INTERVENTIONS: Therapeutic exercises, Therapeutic activity, Neuromuscular re-education, Balance training, Gait training, Patient/Family education, Self Care, Joint mobilization, Vestibular training, Canalith repositioning, Aquatic Therapy, Dry Needling, Electrical stimulation, Spinal mobilization, Cryotherapy, Moist heat, Taping, Vasopneumatic device, Traction, Ultrasound, Ionotophoresis 4mg /ml Dexamethasone, and Manual therapy  PLAN FOR NEXT SESSION:   DN;  UBE, review HEP, progress postural strengthening including resistance training upper quarter  Lavinia Sharps, PT 04/30/23 9:47 AM Phone: [phone] Fax: [phone]

## 2023-05-03 ENCOUNTER — Ambulatory Visit: Payer: Managed Care, Other (non HMO) | Admitting: Physical Therapy

## 2023-05-03 DIAGNOSIS — M542 Cervicalgia: Secondary | ICD-10-CM

## 2023-05-03 DIAGNOSIS — M6281 Muscle weakness (generalized): Secondary | ICD-10-CM

## 2023-05-03 DIAGNOSIS — M546 Pain in thoracic spine: Secondary | ICD-10-CM

## 2023-05-03 NOTE — Therapy (Signed)
OUTPATIENT PHYSICAL THERAPY CERVICAL TREATMENT NOTE   Patient Name: Adrienne Williams MRN: [id number] DOB:[birth year], 42 y.o., female Today's Date: 05/03/2023  END OF SESSION:  PT End of Session - 05/03/23 0844     Visit Number 9    Date for PT Re-Evaluation 05/22/23    Authorization Type [employer]    PT Start Time 0843    PT Stop Time 0925    PT Time Calculation (min) 42 min    Activity Tolerance Patient tolerated treatment well              Past Medical History:  Diagnosis Date   Anxiety    Asthma    Depression    Family history of breast cancer    Homero Fellers breech presentation 05/29/2018   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   Hemorrhoids    Hx of varicella    PONV (postoperative nausea and vomiting)    "little nauseated"   Postpartum care following cesarean delivery (6/14) 01/18/2016   Past Surgical History:  Procedure Laterality Date   BREAST BIOPSY Right 02/25/2023   CESAREAN SECTION N/A 01/18/2016   Procedure: Primary CESAREAN SECTION;  Surgeon: Genia Del, MD;  Location: [facility];  Service: Obstetrics;  Laterality: N/A;  EDD: 01/25/16   CESAREAN SECTION N/A 05/29/2018   Procedure: Repeat CESAREAN SECTION;  Surgeon: Shea Evans, MD;  Location: [facility];  Service: Obstetrics;  Laterality: N/A;  EDD: 06/10/18   COLONOSCOPY     CYSTECTOMY  2002   pilonidal region   DIAGNOSTIC LAPAROSCOPY WITH REMOVAL OF ECTOPIC PREGNANCY Right 05/29/2020   Procedure: DIAGNOSTIC LAPAROSCOPY WITH REMOVAL OF ECTOPIC PREGNANCY;  Surgeon: Shea Evans, MD;  Location: [facility] OR;  Service: Gynecology;  Laterality: Right;   MOLE REMOVAL     UNILATERAL SALPINGECTOMY Right 05/29/2020   Procedure: UNILATERAL SALPINGECTOMY;  Surgeon: Shea Evans, MD;  Location: [facility] OR;  Service: Gynecology;  Laterality: Right;   WISDOM TOOTH EXTRACTION     Patient Active Problem List   Diagnosis Date Noted   Neck pain, chronic 06/27/2022   Fibromyalgia  06/27/2022   Myofascial pain 06/27/2022   Internal hemorrhoids 06/13/2022   Rectal bleeding 06/13/2022   Family history of breast cancer 01/18/2022   Monoallelic mutation of CHEK2 gene in female patient 01/18/2022   Anxiety and depression 05/31/2018   Rh negative, maternal 05/31/2018   Homero Fellers breech presentation 05/29/2018   Status post repeat low transverse cesarean section 05/29/2018   Postpartum care following cesarean delivery (10/24) 05/29/2018    PCP: Jarrett Soho, PA-C   REFERRING PROVIDER: Jackelyn Poling, DO  REFERRING DIAG: [phone] (ICD-10-CM) - Other muscle spasm  THERAPY DIAG:  Pain in thoracic spine  Cervicalgia  Muscle weakness (generalized)  Rationale for Evaluation and Treatment: Rehabilitation  ONSET DATE: 03/26/2023  SUBJECTIVE:  SUBJECTIVE STATEMENT: Worked 12 1/2 hours.  PT and meditation saved me.  Hard to get in the right position of the car and also hard to get the bra in the right area.  States she feels really tired today.    EVAL: Patient reports she was helping her husband install garage seal on 03-10-23.  She felt severe pain in thoracic spine and neck onset around 03-13-23 and explains that she noticed that she had a lot of swelling and had to get a bigger bra and extender for her bra due to the swelling.  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  Difficulty with driving, sleeping, IADL's self care and working.  I have two kids and caring for them and the dogs has been very challenging.  I am usually very active but unable to do anything right now.  Hand dominance: Right  PERTINENT HISTORY:  na  PAIN:  PAIN:  Are you having pain? Yes NPRS scale: ranges ranges 0-5/10 Pain location: bil neck pain; tightness in  pectorals Aggravating factors: stress; overstretching Relieving factors: DN   PRECAUTIONS: None  RED FLAGS: None     WEIGHT BEARING RESTRICTIONS: No  FALLS:  Has patient fallen in last 6 months? No  LIVING ENVIRONMENT: Lives with: lives with their family and lives with their spouse Lives in: House/apartment   OCCUPATION: Pharmacist  PLOF: Independent, Independent with basic ADLs, Independent with household mobility without device, Independent with community mobility without device, Independent with homemaking with ambulation, Independent with gait, and Independent with transfers  PATIENT GOALS: to eliminate pain and be able to work and do my usual daily activities and sleep without interruption  NEXT MD VISIT: 04/10/23  OBJECTIVE:   DIAGNOSTIC FINDINGS:  MRI showed small lesion 4 mm breast and had biopsy in July; normal but recommended another MRI and biopsy   PATIENT SURVEYS:  NDI 80% self disability score  COGNITION: Overall cognitive status: Within functional limits for tasks assessed  SENSATION: WFL  POSTURE: rounded shoulders  PALPATION: Tight bands and trigger points in R > L upper traps and rhomboids   CERVICAL ROM:   Active ROM A/PROM (deg) eval  Flexion WNL  Extension WNL  Right lateral flexion WNL  Left lateral flexion WNL  Right rotation WNL  Left rotation WNL   (Blank rows = not tested)   UPPER EXTREMITY MMT: 5/5 throughout bilateral upper extremities but patient states that she "is going to be so sore" from the muscle testing throughout the test.   TODAY'S TREATMENT:     DATE: 05/03/23 UBE 2:25 forward/backward Cable rows 5# 10x 5# dumbbell overhead press 10x alternating sides Supine pair of 3# dumbbells chest press 10x Discussed pulling ex's emphasizing middle and lower traps and lats to help lengthen pectorals Manual therapy to right teres, left upper traps,  left levator scap, bil pectorals Trigger Point Dry-Needling  Treatment  instructions: Expect mild to moderate muscle soreness. S/S of pneumothorax if dry needled over a lung field, and to seek immediate medical attention should they occur. Patient verbalized understanding of these instructions and education.  Patient Consent Given: Yes Education handout provided: Previously provided Muscles treated: right lats,right teres, left upper traps, left levator scap;  bil pecs Electrical stimulation performed: No Parameters: N/A Treatment response/outcome: dec tender point size/number, twitch response and decreased muscle tension  Heat 2 min to pectorals 04/30/23 Blue band rows 10x2 Doorway pec stretch static hold 20 sec 2 sets  of 3 at 3 hand levels (low medium and high) Triceps 20# 5x (very challenging)  Doorway lat stretch 20 sec 2 sets Green band lat pulls 10x2 Wall push up 10x Overhead press 5# 5x right/left Sit to stand with 5# dumbbells overhead press 5x Squats holding 5# dumbbells at shoulders 5x Dead lifts to knee level with 5# dumbbells 10x Overhead hold 5# dumbbell with BOSU toe tap 10x Shrugs 7# dumbbells bil 10x  Cervical retractions 5x  DATE: 04/25/23 UBE 2 forward/backward Cable row 10# 10x (difficult) Lat bar standing 25# 10x Band green over top of the door lat pulls(given for home)  Towel SNAG rotation 10x right/left Towel assisted cervical extension 5x Diaphragmatic breathing to decrease accessory anterior neck muscle overuse Manual therapy to bil lats, bil teres, bil upper traps,  bil levator scap Trigger Point Dry-Needling  Treatment instructions: Expect mild to moderate muscle soreness. S/S of pneumothorax if dry needled over a lung field, and to seek immediate medical attention should they occur. Patient verbalized understanding of these instructions and education.  Patient Consent Given: Yes Education handout provided: Previously provided Muscles treated: right  lats,right teres, bil upper traps, bil levator scap;  Electrical  stimulation performed: No Parameters: N/A Treatment response/outcome: dec tender point size/number, twitch response and decreased muscle tension        PATIENT EDUCATION:  Education details: initiated HEP Person educated: Patient Education method: Programmer, multimedia, Facilities manager, Verbal cues, and Handouts Education comprehension: verbalized understanding, returned demonstration, and verbal cues required  HOME EXERCISE PROGRAM: Access Code: [email or web address] URL: [email or web address] Date: 04/30/2023 Prepared by: Lavinia Sharps  Exercises - Supine Chest Stretch on Foam Roll  - 1 x daily - 7 x weekly - 3 sets - 10 reps - Thoracic Extension Mobilization on Foam Roll  - 1 x daily - 7 x weekly - 3 sets - 10 reps - Supine Shoulder Horizontal Abduction with Resistance  - 1 x daily - 7 x weekly - 3 sets - 10 reps - Supine PNF D2 Flexion with Resistance  - 1 x daily - 7 x weekly - 3 sets - 10 reps - Latissimus Dorsi Stretch at Wall  - 2 x daily - 7 x weekly - 1 sets - 2-3 reps - 20 hold - Prone Chest Stretch on Chair  - 1 x daily - 7 x weekly - 1 sets - 2-3 reps - 20 hold - Standing Thoracic Open Book at Wall  - 1 x daily - 7 x weekly - 1 sets - 5 reps - Standing Cervical Retraction  - 3-4 x daily - 7 x weekly - 1 sets - 5 reps - Wall Push Up  - 1 x daily - 7 x weekly - 1 sets - 10 reps - Wall Angels  - 1 x daily - 7 x weekly - 1 sets - 10 reps - Shoulder Overhead Press in Flexion with Dumbbells  - 1 x daily - 7 x weekly - 2 sets - 5 reps - Squat with Chair Touch  - 1 x daily - 7 x weekly - 2 sets - 5 reps - Half Deadlift with Kettlebell  - 1 x daily - 7 x weekly - 2 sets - 5 reps - Farmer's Carry with Kettlebells  - 1 x daily - 7 x weekly - 1 sets - 10 reps - Standing Hip Circles  - 1 x daily - 7 x weekly - 1 sets - 10 reps - Seated Assisted Cervical Rotation with Towel  - 1  x daily - 7 x weekly - 1 sets - 10 reps - Cervical Extension AROM with Strap  - 1 x daily - 7 x weekly - 1 sets - 10  reps - Supine Diaphragmatic Breathing  - 1 x daily - 7 x weekly - 1 sets - 10 reps - Doorway Pec Stretch at 90 Degrees Abduction  - 1 x daily - 7 x weekly - 2 sets - 2 reps - 20 hold - Doorway Pec Stretch at 120 Degrees Abduction  - 1 x daily - 7 x weekly - 2 sets - 2 reps - 20 hold - Standing Quadratus Lumborum Stretch with Doorway  - 1 x daily - 7 x weekly - 2 sets - 2 reps - 20 hold -ASSESSMENT:  CLINICAL IMPRESSION:  The patient is more fatigued today after returning to work this week and working very long hours.  Added DN to bil pectorals with improved muscle lengthening noted and a positive change per patient report.  She is compliant with multiple strategies to address her myofascial pain including meditation and deep breathing as well as exercise.  Therapist monitoring response to all interventions and modifying treatment accordingly.     OBJECTIVE IMPAIRMENTS: increased fascial restrictions and increased muscle spasms.   ACTIVITY LIMITATIONS: carrying, lifting, bending, sitting, squatting, sleeping, transfers, bed mobility, bathing, dressing, and hygiene/grooming  PARTICIPATION LIMITATIONS: meal prep, cleaning, laundry, interpersonal relationship, driving, shopping, community activity, and occupation  PERSONAL FACTORS: Education, Past/current experiences, and 3+ comorbidities: anxiety , depression and chronic pain  are also affecting patient's functional outcome.   REHAB POTENTIAL: Fair due to   CLINICAL DECISION MAKING: Unstable/unpredictable  EVALUATION COMPLEXITY: High   GOALS: Goals reviewed with patient? Yes  SHORT TERM GOALS: Target date: 04/24/2023   Patient to report pain no greater than 2/10  Baseline:  Goal status: ongoing  2.  Patient will be independent with initial HEP  Baseline:  Goal status: met 9/17  \  LONG TERM GOALS: Target date: 05/22/2023   Patient to report pain no greater than 2/10  Baseline:  Goal status: INITIAL  2.  Patient to be  independent with advanced HEP  Baseline:  Goal status: INITIAL  3.  Patient to be able to sleep through the night  Baseline:  Goal status: INITIAL  4.  Patient to be able to return to work Baseline:  Goal status: INITIAL  5.  Neck pain disablity score to improve by 5 Baseline:  Goal status: INITIAL  6.  Patient to report 85% improvement in overall symptoms  Baseline:  Goal status: INITIAL   PLAN:  PT FREQUENCY: 1-2x/week  PT DURATION: 8 weeks  PLANNED INTERVENTIONS: Therapeutic exercises, Therapeutic activity, Neuromuscular re-education, Balance training, Gait training, Patient/Family education, Self Care, Joint mobilization, Vestibular training, Canalith repositioning, Aquatic Therapy, Dry Needling, Electrical stimulation, Spinal mobilization, Cryotherapy, Moist heat, Taping, Vasopneumatic device, Traction, Ultrasound, Ionotophoresis 4mg /ml Dexamethasone, and Manual therapy  PLAN FOR NEXT SESSION:   DN;  UBE, review HEP, progress postural strengthening including resistance training upper quarter  Lavinia Sharps, PT 05/03/23 9:34 AM Phone: [phone] Fax: [phone]

## 2023-05-07 ENCOUNTER — Ambulatory Visit: Payer: Managed Care, Other (non HMO) | Attending: Family Medicine | Admitting: Physical Therapy

## 2023-05-07 DIAGNOSIS — N393 Stress incontinence (female) (male): Secondary | ICD-10-CM | POA: Insufficient documentation

## 2023-05-07 DIAGNOSIS — M542 Cervicalgia: Secondary | ICD-10-CM

## 2023-05-07 DIAGNOSIS — M546 Pain in thoracic spine: Secondary | ICD-10-CM

## 2023-05-07 DIAGNOSIS — M6281 Muscle weakness (generalized): Secondary | ICD-10-CM

## 2023-05-07 NOTE — Therapy (Signed)
OUTPATIENT PHYSICAL THERAPY CERVICAL TREATMENT NOTE   Patient Name: Adrienne Williams MRN: [id number] DOB:1981/02/10, 42 y.o., female Today's Date: 05/07/2023  END OF SESSION:  PT End of Session - 05/07/23 0844     Visit Number 10    Date for PT Re-Evaluation 05/22/23    Authorization Type [employer]    PT Start Time 0845    PT Stop Time 0950   heat after session   PT Time Calculation (min) 65 min    Activity Tolerance Patient tolerated treatment well              Past Medical History:  Diagnosis Date   Anxiety    Asthma    Depression    Family history of breast cancer    Homero Fellers breech presentation 05/29/2018   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   Hemorrhoids    Hx of varicella    PONV (postoperative nausea and vomiting)    "little nauseated"   Postpartum care following cesarean delivery (6/14) 01/18/2016   Past Surgical History:  Procedure Laterality Date   BREAST BIOPSY Right 02/25/2023   CESAREAN SECTION N/A 01/18/2016   Procedure: Primary CESAREAN SECTION;  Surgeon: Genia Del, MD;  Location: [facility];  Service: Obstetrics;  Laterality: N/A;  EDD: 01/25/16   CESAREAN SECTION N/A 05/29/2018   Procedure: Repeat CESAREAN SECTION;  Surgeon: Shea Evans, MD;  Location: [facility];  Service: Obstetrics;  Laterality: N/A;  EDD: 06/10/18   COLONOSCOPY     CYSTECTOMY  2002   pilonidal region   DIAGNOSTIC LAPAROSCOPY WITH REMOVAL OF ECTOPIC PREGNANCY Right 05/29/2020   Procedure: DIAGNOSTIC LAPAROSCOPY WITH REMOVAL OF ECTOPIC PREGNANCY;  Surgeon: Shea Evans, MD;  Location: [facility] OR;  Service: Gynecology;  Laterality: Right;   MOLE REMOVAL     UNILATERAL SALPINGECTOMY Right 05/29/2020   Procedure: UNILATERAL SALPINGECTOMY;  Surgeon: Shea Evans, MD;  Location: [facility] OR;  Service: Gynecology;  Laterality: Right;   WISDOM TOOTH EXTRACTION     Patient Active Problem List   Diagnosis Date Noted   Neck pain, chronic 06/27/2022    Fibromyalgia 06/27/2022   Myofascial pain 06/27/2022   Internal hemorrhoids 06/13/2022   Rectal bleeding 06/13/2022   Family history of breast cancer 01/18/2022   Monoallelic mutation of CHEK2 gene in female patient 01/18/2022   Anxiety and depression 05/31/2018   Rh negative, maternal 05/31/2018   Homero Fellers breech presentation 05/29/2018   Status post repeat low transverse cesarean section 05/29/2018   Postpartum care following cesarean delivery (10/24) 05/29/2018    PCP: Jarrett Soho, PA-C   REFERRING PROVIDER: Jackelyn Poling, DO  REFERRING DIAG: [phone] (ICD-10-CM) - Other muscle spasm  THERAPY DIAG:  Pain in thoracic spine  Cervicalgia  Muscle weakness (generalized)  Rationale for Evaluation and Treatment: Rehabilitation  ONSET DATE: 03/26/2023  SUBJECTIVE:  SUBJECTIVE STATEMENT: Worked all day yesterday and it was overall a good day.  I could tell when it started raining though.  I think the green band lat pulls, they helped at work.  Meditation is really saving me.  Driving really sucks.    EVAL: Patient reports she was helping her husband install garage seal on 03-10-23.  She felt severe pain in thoracic spine and neck onset around 03-13-23 and explains that she noticed that she had a lot of swelling and had to get a bigger bra and extender for her bra due to the swelling.  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  Difficulty with driving, sleeping, IADL's self care and working.  I have two kids and caring for them and the dogs has been very challenging.  I am usually very active but unable to do anything right now.  Hand dominance: Right  PERTINENT HISTORY:  na  PAIN:  PAIN:  Are you having pain? Yes NPRS scale: ranges ranges "hard to  put a number on it but less than a 5"/10 Pain location: bil neck pain; tightness in pectorals Aggravating factors: stress; overstretching Relieving factors: DN   PRECAUTIONS: None  RED FLAGS: None     WEIGHT BEARING RESTRICTIONS: No  FALLS:  Has patient fallen in last 6 months? No  LIVING ENVIRONMENT: Lives with: lives with their family and lives with their spouse Lives in: House/apartment   OCCUPATION: Pharmacist  PLOF: Independent, Independent with basic ADLs, Independent with household mobility without device, Independent with community mobility without device, Independent with homemaking with ambulation, Independent with gait, and Independent with transfers  PATIENT GOALS: to eliminate pain and be able to work and do my usual daily activities and sleep without interruption  NEXT MD VISIT: 04/10/23  OBJECTIVE:   DIAGNOSTIC FINDINGS:  MRI showed small lesion 4 mm breast and had biopsy in July; normal but recommended another MRI and biopsy   PATIENT SURVEYS:  NDI 80% self disability score  COGNITION: Overall cognitive status: Within functional limits for tasks assessed  SENSATION: WFL  POSTURE: rounded shoulders  PALPATION: Tight bands and trigger points in R > L upper traps and rhomboids   CERVICAL ROM:   Active ROM A/PROM (deg) eval  Flexion WNL  Extension WNL  Right lateral flexion WNL  Left lateral flexion WNL  Right rotation WNL  Left rotation WNL   (Blank rows = not tested)   UPPER EXTREMITY MMT: 5/5 throughout bilateral upper extremities but patient states that she "is going to be so sore" from the muscle testing throughout the test.   TODAY'S TREATMENT:   05/07/23 Nu-Step L1 4 min Discussed use of lumbar roll in the car for postural alignment Cervical retractions 8x Doorway pec stretch static hold 20 sec 2 sets of 3 at 3 hand levels (low medium and high) Lat bar standing 25# 10x Standing green band anchored over the top of the door: lat  pulls 10x, bil shoulder extensions 10x Manual therapy to bil teres,  bil pectorals Trigger Point Dry-Needling  Treatment instructions: Expect mild to moderate muscle soreness. S/S of pneumothorax if dry needled over a lung field, and to seek immediate medical attention should they occur. Patient verbalized understanding of these instructions and education.  Patient Consent Given: Yes Education handout provided: Previously provided Muscles treated: bil teres, bil pectorals Electrical stimulation performed: No Parameters: N/A Treatment response/outcome: dec tender point size/number, twitch response and decreased muscle tension  Heat 20 min to pectorals    DATE: 05/03/23 UBE 2:25 forward/backward Cable rows 5# 10x 5# dumbbell overhead press 10x alternating sides Supine pair of 3# dumbbells chest press 10x Discussed pulling ex's emphasizing middle and lower traps and lats to help lengthen pectorals Manual therapy to right teres, left upper traps,  left levator scap, bil pectorals Trigger Point Dry-Needling  Treatment instructions: Expect mild to moderate muscle soreness. S/S of pneumothorax if dry needled over a lung field, and to seek immediate medical attention should they occur. Patient verbalized understanding of these instructions and education.  Patient Consent Given: Yes Education handout provided: Previously provided Muscles treated: right lats,right teres, left upper traps, left levator scap;  bil pecs Electrical stimulation performed: No Parameters: N/A Treatment response/outcome: dec tender point size/number, twitch response and decreased muscle tension  Heat 2 min to pectorals 04/30/23 Blue band rows 10x2 Doorway pec stretch static hold 20 sec 2 sets of 3 at 3 hand levels (low medium and high) Triceps 20# 5x (very challenging)  Doorway lat stretch 20 sec 2 sets Green band lat pulls 10x2 Wall push up 10x Overhead press 5# 5x right/left Sit to stand with 5# dumbbells  overhead press 5x Squats holding 5# dumbbells at shoulders 5x Dead lifts to knee level with 5# dumbbells 10x Overhead hold 5# dumbbell with BOSU toe tap 10x Shrugs 7# dumbbells bil 10x  Cervical retractions 5x  DATE: 04/25/23 UBE 2 forward/backward Cable row 10# 10x (difficult) Lat bar standing 25# 10x Band green over top of the door lat pulls(given for home)  Towel SNAG rotation 10x right/left Towel assisted cervical extension 5x Diaphragmatic breathing to decrease accessory anterior neck muscle overuse Manual therapy to bil lats, bil teres, bil upper traps,  bil levator scap Trigger Point Dry-Needling  Treatment instructions: Expect mild to moderate muscle soreness. S/S of pneumothorax if dry needled over a lung field, and to seek immediate medical attention should they occur. Patient verbalized understanding of these instructions and education.  Patient Consent Given: Yes Education handout provided: Previously provided Muscles treated: right  lats,right teres, bil upper traps, bil levator scap;  Electrical stimulation performed: No Parameters: N/A Treatment response/outcome: dec tender point size/number, twitch response and decreased muscle tension        PATIENT EDUCATION:  Education details: initiated HEP Person educated: Patient Education method: Programmer, multimedia, Facilities manager, Verbal cues, and Handouts Education comprehension: verbalized understanding, returned demonstration, and verbal cues required  HOME EXERCISE PROGRAM: Access Code: [email or web address] URL: [email or web address] Date: 04/30/2023 Prepared by: Lavinia Sharps  Exercises - Supine Chest Stretch on Foam Roll  - 1 x daily - 7 x weekly - 3 sets - 10 reps - Thoracic Extension Mobilization on Foam Roll  - 1 x daily - 7 x weekly - 3 sets - 10 reps - Supine Shoulder Horizontal Abduction with Resistance  - 1 x daily - 7 x weekly - 3 sets - 10 reps - Supine PNF D2 Flexion with Resistance  - 1 x daily - 7 x weekly  - 3 sets - 10 reps - Latissimus Dorsi Stretch at Wall  - 2 x daily - 7 x weekly - 1 sets - 2-3 reps - 20 hold - Prone Chest Stretch on Chair  - 1 x daily - 7 x weekly - 1 sets - 2-3 reps - 20 hold - Standing Thoracic Open Book at Wall  - 1 x daily - 7 x weekly - 1 sets - 5 reps - Standing Cervical Retraction  -  3-4 x daily - 7 x weekly - 1 sets - 5 reps - Wall Push Up  - 1 x daily - 7 x weekly - 1 sets - 10 reps - Wall Angels  - 1 x daily - 7 x weekly - 1 sets - 10 reps - Shoulder Overhead Press in Flexion with Dumbbells  - 1 x daily - 7 x weekly - 2 sets - 5 reps - Squat with Chair Touch  - 1 x daily - 7 x weekly - 2 sets - 5 reps - Half Deadlift with Kettlebell  - 1 x daily - 7 x weekly - 2 sets - 5 reps - Farmer's Carry with Kettlebells  - 1 x daily - 7 x weekly - 1 sets - 10 reps - Standing Hip Circles  - 1 x daily - 7 x weekly - 1 sets - 10 reps - Seated Assisted Cervical Rotation with Towel  - 1 x daily - 7 x weekly - 1 sets - 10 reps - Cervical Extension AROM with Strap  - 1 x daily - 7 x weekly - 1 sets - 10 reps - Supine Diaphragmatic Breathing  - 1 x daily - 7 x weekly - 1 sets - 10 reps - Doorway Pec Stretch at 90 Degrees Abduction  - 1 x daily - 7 x weekly - 2 sets - 2 reps - 20 hold - Doorway Pec Stretch at 120 Degrees Abduction  - 1 x daily - 7 x weekly - 2 sets - 2 reps - 20 hold - Standing Quadratus Lumborum Stretch with Doorway  - 1 x daily - 7 x weekly - 2 sets - 2 reps - 20 hold -ASSESSMENT:  CLINICAL IMPRESSION:  Overall decreasing pain sensitivity.  Good carryover with home ex's and meditation to help with pain modulation.  The patient benefits significantly from dry needling and manual therapy to stimulate underlying myofascial trigger points and muscular tissue for management of neuromusculoskeletal pain and address movement impairments.  Therapist monitoring response to all interventions and modifying treatment accordingly.      OBJECTIVE IMPAIRMENTS: increased  fascial restrictions and increased muscle spasms.   ACTIVITY LIMITATIONS: carrying, lifting, bending, sitting, squatting, sleeping, transfers, bed mobility, bathing, dressing, and hygiene/grooming  PARTICIPATION LIMITATIONS: meal prep, cleaning, laundry, interpersonal relationship, driving, shopping, community activity, and occupation  PERSONAL FACTORS: Education, Past/current experiences, and 3+ comorbidities: anxiety , depression and chronic pain  are also affecting patient's functional outcome.   REHAB POTENTIAL: Fair due to   CLINICAL DECISION MAKING: Unstable/unpredictable  EVALUATION COMPLEXITY: High   GOALS: Goals reviewed with patient? Yes  SHORT TERM GOALS: Target date: 04/24/2023   Patient to report pain no greater than 2/10  Baseline:  Goal status: ongoing  2.  Patient will be independent with initial HEP  Baseline:  Goal status: met 9/17  \  LONG TERM GOALS: Target date: 05/22/2023   Patient to report pain no greater than 2/10  Baseline:  Goal status: INITIAL  2.  Patient to be independent with advanced HEP  Baseline:  Goal status: INITIAL  3.  Patient to be able to sleep through the night  Baseline:  Goal status: INITIAL  4.  Patient to be able to return to work Baseline:  Goal status: INITIAL  5.  Neck pain disablity score to improve by 5 Baseline:  Goal status: INITIAL  6.  Patient to report 85% improvement in overall symptoms  Baseline:  Goal status: INITIAL   PLAN:  PT  FREQUENCY: 1-2x/week  PT DURATION: 8 weeks  PLANNED INTERVENTIONS: Therapeutic exercises, Therapeutic activity, Neuromuscular re-education, Balance training, Gait training, Patient/Family education, Self Care, Joint mobilization, Vestibular training, Canalith repositioning, Aquatic Therapy, Dry Needling, Electrical stimulation, Spinal mobilization, Cryotherapy, Moist heat, Taping, Vasopneumatic device, Traction, Ultrasound, Ionotophoresis 4mg /ml Dexamethasone, and Manual  therapy  PLAN FOR NEXT SESSION:   DN;  UBE, review HEP, progress postural strengthening including resistance training upper quarter   Lavinia Sharps, PT 05/07/23 6:17 PM Phone: [phone] Fax: [phone]

## 2023-05-09 ENCOUNTER — Ambulatory Visit: Payer: Managed Care, Other (non HMO) | Attending: Family Medicine | Admitting: Physical Therapy

## 2023-05-09 DIAGNOSIS — M546 Pain in thoracic spine: Secondary | ICD-10-CM | POA: Diagnosis present

## 2023-05-09 DIAGNOSIS — M6281 Muscle weakness (generalized): Secondary | ICD-10-CM | POA: Insufficient documentation

## 2023-05-09 DIAGNOSIS — M62838 Other muscle spasm: Secondary | ICD-10-CM | POA: Insufficient documentation

## 2023-05-09 DIAGNOSIS — M542 Cervicalgia: Secondary | ICD-10-CM | POA: Insufficient documentation

## 2023-05-09 NOTE — Therapy (Signed)
OUTPATIENT PHYSICAL THERAPY CERVICAL TREATMENT NOTE   Patient Name: Adrienne Williams MRN: [id number] DOB:01-Mar-1981, 42 y.o., female Today's Date: 05/09/2023  END OF SESSION:  PT End of Session - 05/09/23 1457     Visit Number 11    Date for PT Re-Evaluation 05/22/23    Authorization Type [employer]    PT Start Time 1447    PT Stop Time 1530    PT Time Calculation (min) 43 min    Activity Tolerance Patient tolerated treatment well              Past Medical History:  Diagnosis Date   Anxiety    Asthma    Depression    Family history of breast cancer    Frank breech presentation 05/29/2018   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   Hemorrhoids    Hx of varicella    PONV (postoperative nausea and vomiting)    "little nauseated"   Postpartum care following cesarean delivery (6/14) 01/18/2016   Past Surgical History:  Procedure Laterality Date   BREAST BIOPSY Right 02/25/2023   CESAREAN SECTION N/A 01/18/2016   Procedure: Primary CESAREAN SECTION;  Surgeon: Genia Del, MD;  Location: [facility];  Service: Obstetrics;  Laterality: N/A;  EDD: 01/25/16   CESAREAN SECTION N/A 05/29/2018   Procedure: Repeat CESAREAN SECTION;  Surgeon: Shea Evans, MD;  Location: [facility];  Service: Obstetrics;  Laterality: N/A;  EDD: 06/10/18   COLONOSCOPY     CYSTECTOMY  2002   pilonidal region   DIAGNOSTIC LAPAROSCOPY WITH REMOVAL OF ECTOPIC PREGNANCY Right 05/29/2020   Procedure: DIAGNOSTIC LAPAROSCOPY WITH REMOVAL OF ECTOPIC PREGNANCY;  Surgeon: Shea Evans, MD;  Location: [facility] OR;  Service: Gynecology;  Laterality: Right;   MOLE REMOVAL     UNILATERAL SALPINGECTOMY Right 05/29/2020   Procedure: UNILATERAL SALPINGECTOMY;  Surgeon: Shea Evans, MD;  Location: [facility] OR;  Service: Gynecology;  Laterality: Right;   WISDOM TOOTH EXTRACTION     Patient Active Problem List   Diagnosis Date Noted   Neck pain, chronic 06/27/2022   Fibromyalgia  06/27/2022   Myofascial pain 06/27/2022   Internal hemorrhoids 06/13/2022   Rectal bleeding 06/13/2022   Family history of breast cancer 01/18/2022   Monoallelic mutation of CHEK2 gene in female patient 01/18/2022   Anxiety and depression 05/31/2018   Rh negative, maternal 05/31/2018   Homero Fellers breech presentation 05/29/2018   Status post repeat low transverse cesarean section 05/29/2018   Postpartum care following cesarean delivery (10/24) 05/29/2018    PCP: Jarrett Soho, PA-C   REFERRING PROVIDER: Jackelyn Poling, DO  REFERRING DIAG: [phone] (ICD-10-CM) - Other muscle spasm  THERAPY DIAG:  Pain in thoracic spine  Cervicalgia  Muscle weakness (generalized)  Rationale for Evaluation and Treatment: Rehabilitation  ONSET DATE: 03/26/2023  SUBJECTIVE:  SUBJECTIVE STATEMENT: My hip flexors feels tight and affects the whole body.  Left > right upper trap and SCM pain.  Worked 4 hours today.  EVAL: Patient reports she was helping her husband install garage seal on 03-10-23.  She felt severe pain in thoracic spine and neck onset around 03-13-23 and explains that she noticed that she had a lot of swelling and had to get a bigger bra and extender for her bra due to the swelling.  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  Difficulty with driving, sleeping, IADL's self care and working.  I have two kids and caring for them and the dogs has been very challenging.  I am usually very active but unable to do anything right now.  Hand dominance: Right  PERTINENT HISTORY:  na  PAIN:  PAIN:  Are you having pain? Yes NPRS scale:  5/10 Pain location: bil neck pain; tightness in pectorals Aggravating factors: stress; overstretching Relieving factors: DN    PRECAUTIONS: None  RED FLAGS: None     WEIGHT BEARING RESTRICTIONS: No  FALLS:  Has patient fallen in last 6 months? No  LIVING ENVIRONMENT: Lives with: lives with their family and lives with their spouse Lives in: House/apartment   OCCUPATION: Pharmacist  PLOF: Independent, Independent with basic ADLs, Independent with household mobility without device, Independent with community mobility without device, Independent with homemaking with ambulation, Independent with gait, and Independent with transfers  PATIENT GOALS: to eliminate pain and be able to work and do my usual daily activities and sleep without interruption  NEXT MD VISIT: 04/10/23  OBJECTIVE:   DIAGNOSTIC FINDINGS:  MRI showed small lesion 4 mm breast and had biopsy in July; normal but recommended another MRI and biopsy   PATIENT SURVEYS:  NDI 80% self disability score  COGNITION: Overall cognitive status: Within functional limits for tasks assessed  SENSATION: WFL  POSTURE: rounded shoulders  PALPATION: Tight bands and trigger points in R > L upper traps and rhomboids   CERVICAL ROM:   Active ROM A/PROM (deg) eval  Flexion WNL  Extension WNL  Right lateral flexion WNL  Left lateral flexion WNL  Right rotation WNL  Left rotation WNL   (Blank rows = not tested)   UPPER EXTREMITY MMT: 5/5 throughout bilateral upper extremities but patient states that she "is going to be so sore" from the muscle testing throughout the test.   TODAY'S TREATMENT:   10/3: 2nd step hip flexors series 4 reps each: UE raise, UE reach up and over, thoracic rotation with hand behind head Doorway lat stretch right /left with added trunk rotation  Review of current HEP and appropriate hold times Manual therapy to bil upper traps, SCM and cervical musculature Trigger Point Dry-Needling  Treatment instructions: Expect mild to moderate muscle soreness. S/S of pneumothorax if dry needled over a lung field, and to seek  immediate medical attention should they occur. Patient verbalized understanding of these instructions and education.  Patient Consent Given: Yes Education handout provided: Previously provided Muscles treated: bil anterior upper trap approach, left SCM, bil Upper trap posterior approach, bil cervical multifidi; suboccipitals Electrical stimulation performed: No Parameters: N/A Treatment response/outcome: dec tender point size/number, twitch response and decreased muscle tension  Heat 10 min     05/07/23 Nu-Step L1 4 min Discussed use of lumbar roll in the car for postural alignment Cervical retractions 8x Doorway pec stretch static hold 20 sec 2 sets of 3  at 3 hand levels (low medium and high) Lat bar standing 25# 10x Standing green band anchored over the top of the door: lat pulls 10x, bil shoulder extensions 10x Manual therapy to bil teres,  bil pectorals Trigger Point Dry-Needling  Treatment instructions: Expect mild to moderate muscle soreness. S/S of pneumothorax if dry needled over a lung field, and to seek immediate medical attention should they occur. Patient verbalized understanding of these instructions and education.  Patient Consent Given: Yes Education handout provided: Previously provided Muscles treated: bil teres, bil pectorals Electrical stimulation performed: No Parameters: N/A Treatment response/outcome: dec tender point size/number, twitch response and decreased muscle tension  Heat 20 min to pectorals    DATE: 05/03/23 UBE 2:25 forward/backward Cable rows 5# 10x 5# dumbbell overhead press 10x alternating sides Supine pair of 3# dumbbells chest press 10x Discussed pulling ex's emphasizing middle and lower traps and lats to help lengthen pectorals Manual therapy to right teres, left upper traps,  left levator scap, bil pectorals Trigger Point Dry-Needling  Treatment instructions: Expect mild to moderate muscle soreness. S/S of pneumothorax if dry needled over a  lung field, and to seek immediate medical attention should they occur. Patient verbalized understanding of these instructions and education.  Patient Consent Given: Yes Education handout provided: Previously provided Muscles treated: right lats,right teres, left upper traps, left levator scap;  bil pecs Electrical stimulation performed: No Parameters: N/A Treatment response/outcome: dec tender point size/number, twitch response and decreased muscle tension  Heat 2 min to pectorals 04/30/23 Blue band rows 10x2 Doorway pec stretch static hold 20 sec 2 sets of 3 at 3 hand levels (low medium and high) Triceps 20# 5x (very challenging)  Doorway lat stretch 20 sec 2 sets Green band lat pulls 10x2 Wall push up 10x Overhead press 5# 5x right/left Sit to stand with 5# dumbbells overhead press 5x Squats holding 5# dumbbells at shoulders 5x Dead lifts to knee level with 5# dumbbells 10x Overhead hold 5# dumbbell with BOSU toe tap 10x Shrugs 7# dumbbells bil 10x  Cervical retractions 5x PATIENT EDUCATION:  Education details: initiated HEP Person educated: Patient Education method: Programmer, multimedia, Facilities manager, Verbal cues, and Handouts Education comprehension: verbalized understanding, returned demonstration, and verbal cues required  HOME EXERCISE PROGRAM: Access Code: [email or web address] URL: [email or web address] Date: 04/30/2023 Prepared by: Lavinia Sharps  Exercises - Supine Chest Stretch on Foam Roll  - 1 x daily - 7 x weekly - 3 sets - 10 reps - Thoracic Extension Mobilization on Foam Roll  - 1 x daily - 7 x weekly - 3 sets - 10 reps - Supine Shoulder Horizontal Abduction with Resistance  - 1 x daily - 7 x weekly - 3 sets - 10 reps - Supine PNF D2 Flexion with Resistance  - 1 x daily - 7 x weekly - 3 sets - 10 reps - Latissimus Dorsi Stretch at Wall  - 2 x daily - 7 x weekly - 1 sets - 2-3 reps - 20 hold - Prone Chest Stretch on Chair  - 1 x daily - 7 x weekly - 1 sets - 2-3 reps -  20 hold - Standing Thoracic Open Book at Wall  - 1 x daily - 7 x weekly - 1 sets - 5 reps - Standing Cervical Retraction  - 3-4 x daily - 7 x weekly - 1 sets - 5 reps - Wall Push Up  - 1 x daily - 7 x weekly - 1 sets - 10 reps - Wall Angels  -  1 x daily - 7 x weekly - 1 sets - 10 reps - Shoulder Overhead Press in Flexion with Dumbbells  - 1 x daily - 7 x weekly - 2 sets - 5 reps - Squat with Chair Touch  - 1 x daily - 7 x weekly - 2 sets - 5 reps - Half Deadlift with Kettlebell  - 1 x daily - 7 x weekly - 2 sets - 5 reps - Farmer's Carry with Kettlebells  - 1 x daily - 7 x weekly - 1 sets - 10 reps - Standing Hip Circles  - 1 x daily - 7 x weekly - 1 sets - 10 reps - Seated Assisted Cervical Rotation with Towel  - 1 x daily - 7 x weekly - 1 sets - 10 reps - Cervical Extension AROM with Strap  - 1 x daily - 7 x weekly - 1 sets - 10 reps - Supine Diaphragmatic Breathing  - 1 x daily - 7 x weekly - 1 sets - 10 reps - Doorway Pec Stretch at 90 Degrees Abduction  - 1 x daily - 7 x weekly - 2 sets - 2 reps - 20 hold - Doorway Pec Stretch at 120 Degrees Abduction  - 1 x daily - 7 x weekly - 2 sets - 2 reps - 20 hold - Standing Quadratus Lumborum Stretch with Doorway  - 1 x daily - 7 x weekly - 2 sets - 2 reps - 20 hold -ASSESSMENT:  CLINICAL IMPRESSION:  The patient benefits significantly from dry needling and manual therapy to stimulate underlying myofascial trigger points and muscular tissue for management of neuromusculoskeletal pain with the addition of SCM today and an anterior approach to upper traps.  We continue to update her HEP and include education to promote movement and postural support.  Therapist monitoring response to all interventions and modifying treatment accordingly.    OBJECTIVE IMPAIRMENTS: increased fascial restrictions and increased muscle spasms.   ACTIVITY LIMITATIONS: carrying, lifting, bending, sitting, squatting, sleeping, transfers, bed mobility, bathing, dressing, and  hygiene/grooming  PARTICIPATION LIMITATIONS: meal prep, cleaning, laundry, interpersonal relationship, driving, shopping, community activity, and occupation  PERSONAL FACTORS: Education, Past/current experiences, and 3+ comorbidities: anxiety , depression and chronic pain  are also affecting patient's functional outcome.   REHAB POTENTIAL: Fair due to   CLINICAL DECISION MAKING: Unstable/unpredictable  EVALUATION COMPLEXITY: High   GOALS: Goals reviewed with patient? Yes  SHORT TERM GOALS: Target date: 04/24/2023   Patient to report pain no greater than 2/10  Baseline:  Goal status: ongoing  2.  Patient will be independent with initial HEP  Baseline:  Goal status: met 9/17  \  LONG TERM GOALS: Target date: 05/22/2023   Patient to report pain no greater than 2/10  Baseline:  Goal status: INITIAL  2.  Patient to be independent with advanced HEP  Baseline:  Goal status: INITIAL  3.  Patient to be able to sleep through the night  Baseline:  Goal status: INITIAL  4.  Patient to be able to return to work Baseline:  Goal status: INITIAL  5.  Neck pain disablity score to improve by 5 Baseline:  Goal status: INITIAL  6.  Patient to report 85% improvement in overall symptoms  Baseline:  Goal status: INITIAL   PLAN:  PT FREQUENCY: 1-2x/week  PT DURATION: 8 weeks  PLANNED INTERVENTIONS: Therapeutic exercises, Therapeutic activity, Neuromuscular re-education, Balance training, Gait training, Patient/Family education, Self Care, Joint mobilization, Vestibular training, Canalith repositioning, Aquatic Therapy,  Dry Needling, Electrical stimulation, Spinal mobilization, Cryotherapy, Moist heat, Taping, Vasopneumatic device, Traction, Ultrasound, Ionotophoresis 4mg /ml Dexamethasone, and Manual therapy  PLAN FOR NEXT SESSION:   DN;  UBE, review HEP, progress postural strengthening including resistance training upper quarter   Lavinia Sharps, PT 05/09/23 5:44  PM Phone: [phone] Fax: [phone]

## 2023-05-12 NOTE — Therapy (Signed)
OUTPATIENT PHYSICAL THERAPY FEMALE PELVIC EVALUATION   Patient Name: Adrienne Williams MRN: [id number] DOB:[birth year], 41 y.o., female Today's Date: 05/13/2023  END OF SESSION:  PT End of Session - 05/13/23 1347     Visit Number 12    Date for PT Re-Evaluation 05/22/23    Authorization Type [employer]    PT Start Time 1230    PT Stop Time 1315    PT Time Calculation (min) 45 min    Activity Tolerance Patient tolerated treatment well    Behavior During Therapy Anxious             Past Medical History:  Diagnosis Date   Anxiety    Asthma    Depression    Family history of breast cancer    Frank breech presentation 05/29/2018   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   Hemorrhoids    Hx of varicella    PONV (postoperative nausea and vomiting)    "little nauseated"   Postpartum care following cesarean delivery (6/14) 01/18/2016   Past Surgical History:  Procedure Laterality Date   BREAST BIOPSY Right 02/25/2023   CESAREAN SECTION N/A 01/18/2016   Procedure: Primary CESAREAN SECTION;  Surgeon: Genia Del, MD;  Location: [facility];  Service: Obstetrics;  Laterality: N/A;  EDD: 01/25/16   CESAREAN SECTION N/A 05/29/2018   Procedure: Repeat CESAREAN SECTION;  Surgeon: Shea Evans, MD;  Location: [facility] SU[facility];  Service: Obstetrics;  Laterality: N/A;  EDD: 06/10/18   COLONOSCOPY     CYSTECTOMY  2002   pilonidal region   DIAGNOSTIC LAPAROSCOPY WITH REMOVAL OF ECTOPIC PREGNANCY Right 05/29/2020   Procedure: DIAGNOSTIC LAPAROSCOPY WITH REMOVAL OF ECTOPIC PREGNANCY;  Surgeon: Shea Evans, MD;  Location: [facility] OR;  Service: Gynecology;  Laterality: Right;   MOLE REMOVAL     UNILATERAL SALPINGECTOMY Right 05/29/2020   Procedure: UNILATERAL SALPINGECTOMY;  Surgeon: Shea Evans, MD;  Location: [facility] OR;  Service: Gynecology;  Laterality: Right;   WISDOM TOOTH EXTRACTION     Patient Active Problem List   Diagnosis Date Noted   Neck pain, chronic  06/27/2022   Fibromyalgia 06/27/2022   Myofascial pain 06/27/2022   Internal hemorrhoids 06/13/2022   Rectal bleeding 06/13/2022   Family history of breast cancer 01/18/2022   Monoallelic mutation of CHEK2 gene in female patient 01/18/2022   Anxiety and depression 05/31/2018   Rh negative, maternal 05/31/2018   Homero Fellers breech presentation 05/29/2018   Status post repeat low transverse cesarean section 05/29/2018   Postpartum care following cesarean delivery (10/24) 05/29/2018    PCP: Jarrett Soho, PA-C PCP - General  REFERRING PROVIDER: Vick Frees, MD Ref Provider  REFERRING DIAG: N39.3 (ICD-10-CM) - Stress incontinence (female) (female)  THERAPY DIAG:  Muscle weakness (generalized)  Other muscle spasm  Unspecified lack of coordination  Rationale for Evaluation and Treatment: Rehabilitation  ONSET DATE: 2019  SUBJECTIVE:    Pt reports that she injured herself, seeing ortho, has had some SUI as well. Reports that she has had severe pain in her upper body, meditation helps. Reports that everything is tight. Privates and hamstrings are tight, Has had a lot of swelling as well - in her pelvis, abdominal as well. Leaks with running.  SUBJECTIVE STATEMENT:  Fluid intake: Yes: mostly water, seltzer, liquid IV    PAIN:  Are you having pain? Yes NPRS scale: 7/10 Pain location: External  Pain type: throbbing, tight, and tingling Pain description: constant, stabbing if super tight  Aggravating factors: walking or running, being on her feet, sitting, standing Relieving factors: meditation, heat, stretching, does not know the right stretches, if she tightens her core  PRECAUTIONS: None  RED FLAGS: None   WEIGHT BEARING RESTRICTIONS: No  FALLS:  Has patient fallen in last 6 months?  No  LIVING ENVIRONMENT: Lives with: lives with their family Lives in: House/apartment Stairs: No Has following equipment at home: None  OCCUPATION: pharmacist- retail, very difficult right now  PLOF: Independent  PATIENT GOALS: to be able to relieve pain without having to spend a ton of time on it, has 2 young kids and a job. Pain is a priority. Rare that the leaking happens.   PERTINENT HISTORY:   Sexual abuse: No  BOWEL MOVEMENT: Pain with bowel movement: Yes when stuff is super tight Type of bowel movement:Strain Yes sometimes Fully empty rectum: Yes: when she goes Leakage: No only when the hemorrhoid is really bad, it was crazy, it' better now Pads: No Fiber supplement: No  URINATION: Pain with urination: No Fully empty bladder: No sometimes feels like she struggles Stream: Weak Urgency: No, used to Frequency: not now Leakage: Coughing, Sneezing, and Exercise, running Pads: Yes:    INTERCOURSE: Pain with intercourse: Initial Penetration, During Penetration, After Intercourse, During Climax, and Pain Interrupts Intercourse Ability to have vaginal penetration:  Yes: has not in a while Climax: yes Marinoff Scale: 2/3  PREGNANCY: Vaginal deliveries 0 Tearing No C-section deliveries 2 Currently pregnant No  PROLAPSE: Check with internal   OBJECTIVE:  Note: Objective measures were completed at Evaluation unless otherwise noted. Very tight and tender abdominal scars Pt irritable Guarded gait Guarded and somewhat anxious     COGNITION: Overall cognitive status: Within functional limits for tasks assessed     SENSATION: Light touch: Appears intact Proprioception: Appears intact  MUSCLE LENGTH: Hamstrings: tight bilateral Thomas test: Right tight ; Left tight    POSTURE: rounded shoulders and forward head  PELVIC ALIGNMENT:  LUMBARAROM/PROM:  A/PROM A/PROM  eval  Flexion limited  Extension limited  Right lateral flexion limited  Left  lateral flexion   Right rotation   Left rotation    (Blank rows = not tested)  LOWER EXTREMITY ROM:  Passive ROM Right eval Left eval  Hip flexion limited limited  Hip extension    Hip abduction limited limited  Hip adduction    Hip internal rotation    Hip external rotation    Knee flexion    Knee extension    Ankle dorsiflexion    Ankle plantarflexion    Ankle inversion    Ankle eversion     (Blank rows = not tested)  LOWER EXTREMITY MMT:  MMT Right eval Left eval  Hip flexion    Hip extension    Hip abduction    Hip adduction    Hip internal rotation    Hip external rotation    Knee flexion    Knee extension    Ankle dorsiflexion    Ankle plantarflexion    Ankle inversion    Ankle eversion     PALPATION:   General  WFL                External Perineal Exam assess next visit  Internal Pelvic Floor assess next visit  Patient confirms identification and approves PT to assess internal pelvic floor and treatment Yes  PELVIC MMT:   MMT eval  Vaginal   Internal Anal Sphincter   External Anal Sphincter   Puborectalis   Diastasis Recti Yes- 2 fingers above umbilicus  (Blank rows = not tested)        TONE: Next visit  PROLAPSE: Next visit  TODAY'S TREATMENT:                                                                                                                              DATE:   EVAL see above   PATIENT EDUCATION:  Education details/ manual performed: scar massage and cupping Person educated: Patient Education method: Programmer, multimedia, Demonstration, and Handouts Education comprehension: verbalized understanding, returned demonstration, verbal cues required, tactile cues required, and needs further education  HOME EXERCISE PROGRAM:  [email or web address]  ASSESSMENT:  CLINICAL IMPRESSION: Patient is a 42 y.o. F who was seen today for physical therapy evaluation and treatment for stress urinary incontinence. At this point  her priority is treating her abdominal and pelvic pain, she reported that she has a lot of irritation post intercourse as well and pain and tightness throughout her abdomen, low back and hips. She did well today with treatment and manual therapy on her tight and tender abdominal scars and stretches. Has limited PROM and guarded movement throughout her low back and hips as well as diastasis rectus. Patient will benefit from PT to reduce pain and urinary incontinence and improve QOL.  OBJECTIVE IMPAIRMENTS: decreased activity tolerance, decreased coordination, decreased endurance, decreased mobility, decreased ROM, decreased strength, increased fascial restrictions, increased muscle spasms, impaired flexibility, and pain.   ACTIVITY LIMITATIONS: bending  PARTICIPATION LIMITATIONS: interpersonal relationship, occupation, and yard work  PERSONAL FACTORS: Time since onset of injury/illness/exacerbation are also affecting patient's functional outcome.   REHAB POTENTIAL: Good  CLINICAL DECISION MAKING: Stable/uncomplicated  EVALUATION COMPLEXITY: Low   GOALS: Goals reviewed with patient? Yes  SHORT TERM GOALS: Target date: 06/10/2023    Pt will be independent with HEP.   Baseline: Goal status: INITIAL  2.  Pt will be able to run/workout for 30 minutes without leakage or discomfort  Baseline:  Goal status: INITIAL  3.  Pt will be independent with diaphragmatic breathing and down training activities in order to improve pelvic floor relaxation.  Baseline:  Goal status: INITIAL   LONG TERM GOALS: Target date: 08/05/2023    Pt will be independent with advanced HEP.   Baseline:  Goal status: INITIAL  2.  Pt will report decreased pelvic pain to max 3/10  Baseline:  Goal status: INITIAL  3.  Pt will report reduced pain abdominal scars to max 3/10 Baseline:  Goal status: INITIAL  4.  Pt will soak 0 pads/ day Baseline:  Goal status: INITIAL   PLAN:  PT FREQUENCY:  1-2x/week  PT DURATION: 12 weeks  PLANNED INTERVENTIONS: Therapeutic  exercises, Therapeutic activity, Neuromuscular re-education, Balance training, Gait training, Patient/Family education, Self Care, Joint mobilization, Dry Needling, Electrical stimulation, Spinal manipulation, Spinal mobilization, scar mobilization, Biofeedback, and Manual therapy  PLAN FOR NEXT SESSION: internal pelvic floor muscle assessment and treatment  Synthia Fairbank, PT 05/13/23 1:50 PM

## 2023-05-13 ENCOUNTER — Ambulatory Visit: Payer: Managed Care, Other (non HMO) | Attending: Obstetrics and Gynecology | Admitting: Physical Therapy

## 2023-05-13 ENCOUNTER — Other Ambulatory Visit: Payer: Self-pay

## 2023-05-13 ENCOUNTER — Encounter: Payer: Self-pay | Admitting: Physical Therapy

## 2023-05-13 DIAGNOSIS — M62838 Other muscle spasm: Secondary | ICD-10-CM | POA: Insufficient documentation

## 2023-05-13 DIAGNOSIS — M546 Pain in thoracic spine: Secondary | ICD-10-CM | POA: Diagnosis not present

## 2023-05-13 DIAGNOSIS — R279 Unspecified lack of coordination: Secondary | ICD-10-CM | POA: Insufficient documentation

## 2023-05-13 DIAGNOSIS — M6281 Muscle weakness (generalized): Secondary | ICD-10-CM | POA: Insufficient documentation

## 2023-05-14 ENCOUNTER — Ambulatory Visit: Payer: Managed Care, Other (non HMO) | Admitting: Physical Therapy

## 2023-05-14 DIAGNOSIS — N393 Stress incontinence (female) (male): Secondary | ICD-10-CM | POA: Diagnosis not present

## 2023-05-14 DIAGNOSIS — M6281 Muscle weakness (generalized): Secondary | ICD-10-CM

## 2023-05-14 DIAGNOSIS — M62838 Other muscle spasm: Secondary | ICD-10-CM

## 2023-05-14 DIAGNOSIS — R252 Cramp and spasm: Secondary | ICD-10-CM

## 2023-05-14 DIAGNOSIS — M542 Cervicalgia: Secondary | ICD-10-CM

## 2023-05-14 DIAGNOSIS — M546 Pain in thoracic spine: Secondary | ICD-10-CM

## 2023-05-14 NOTE — Therapy (Signed)
OUTPATIENT PHYSICAL THERAPY CERVICAL TREATMENT NOTE   Patient Name: Adrienne Williams MRN: [id number] DOB:[birth year], 42 y.o., female Today's Date: 05/14/2023  END OF SESSION:  PT End of Session - 05/14/23 0846     Visit Number 12   ortho appts   Date for PT Re-Evaluation 05/22/23    Authorization Type [employer]    PT Start Time 0845    PT Stop Time 0930    PT Time Calculation (min) 45 min    Activity Tolerance Patient tolerated treatment well              Past Medical History:  Diagnosis Date   Anxiety    Asthma    Depression    Family history of breast cancer    Homero Fellers breech presentation 05/29/2018   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   Hemorrhoids    Hx of varicella    PONV (postoperative nausea and vomiting)    "little nauseated"   Postpartum care following cesarean delivery (6/14) 01/18/2016   Past Surgical History:  Procedure Laterality Date   BREAST BIOPSY Right 02/25/2023   CESAREAN SECTION N/A 01/18/2016   Procedure: Primary CESAREAN SECTION;  Surgeon: Genia Del, MD;  Location: [facility];  Service: Obstetrics;  Laterality: N/A;  EDD: 01/25/16   CESAREAN SECTION N/A 05/29/2018   Procedure: Repeat CESAREAN SECTION;  Surgeon: Shea Evans, MD;  Location: [facility];  Service: Obstetrics;  Laterality: N/A;  EDD: 06/10/18   COLONOSCOPY     CYSTECTOMY  2002   pilonidal region   DIAGNOSTIC LAPAROSCOPY WITH REMOVAL OF ECTOPIC PREGNANCY Right 05/29/2020   Procedure: DIAGNOSTIC LAPAROSCOPY WITH REMOVAL OF ECTOPIC PREGNANCY;  Surgeon: Shea Evans, MD;  Location: [facility] OR;  Service: Gynecology;  Laterality: Right;   MOLE REMOVAL     UNILATERAL SALPINGECTOMY Right 05/29/2020   Procedure: UNILATERAL SALPINGECTOMY;  Surgeon: Shea Evans, MD;  Location: [facility] OR;  Service: Gynecology;  Laterality: Right;   WISDOM TOOTH EXTRACTION     Patient Active Problem List   Diagnosis Date Noted   Neck pain, chronic 06/27/2022    Fibromyalgia 06/27/2022   Myofascial pain 06/27/2022   Internal hemorrhoids 06/13/2022   Rectal bleeding 06/13/2022   Family history of breast cancer 01/18/2022   Monoallelic mutation of CHEK2 gene in female patient 01/18/2022   Anxiety and depression 05/31/2018   Rh negative, maternal 05/31/2018   Homero Fellers breech presentation 05/29/2018   Status post repeat low transverse cesarean section 05/29/2018   Postpartum care following cesarean delivery (10/24) 05/29/2018    PCP: Jarrett Soho, PA-C   REFERRING PROVIDER: Jackelyn Poling, DO  REFERRING DIAG: [phone] (ICD-10-CM) - Other muscle spasm  THERAPY DIAG:  Muscle weakness (generalized)  Other muscle spasm  Cervicalgia  Pain in thoracic spine  Cramp and spasm  Rationale for Evaluation and Treatment: Rehabilitation  ONSET DATE: 03/26/2023  SUBJECTIVE:  SUBJECTIVE STATEMENT: Worked 3 1/2 days straight.  Had a big pain spike.  I did the bands a lot, maybe too much.  I think this helps, I always feel better after PT.  EVAL: Patient reports she was helping her husband install garage seal on 03-10-23.  She felt severe pain in thoracic spine and neck onset around 03-13-23 and explains that she noticed that she had a lot of swelling and had to get a bigger bra and extender for her bra due to the swelling.  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  Difficulty with driving, sleeping, IADL's self care and working.  I have two kids and caring for them and the dogs has been very challenging.  I am usually very active but unable to do anything right now.  Hand dominance: Right  PERTINENT HISTORY:  na  PAIN:  PAIN:  Are you having pain? Yes NPRS scale:  5/10 Pain location: my lower body bothers me more than  neck pain  Aggravating factors: stress; overstretching Relieving factors: DN   PRECAUTIONS: None  RED FLAGS: None     WEIGHT BEARING RESTRICTIONS: No  FALLS:  Has patient fallen in last 6 months? No  LIVING ENVIRONMENT: Lives with: lives with their family and lives with their spouse Lives in: House/apartment   OCCUPATION: Pharmacist  PLOF: Independent, Independent with basic ADLs, Independent with household mobility without device, Independent with community mobility without device, Independent with homemaking with ambulation, Independent with gait, and Independent with transfers  PATIENT GOALS: to eliminate pain and be able to work and do my usual daily activities and sleep without interruption  NEXT MD VISIT: 04/10/23  OBJECTIVE:   DIAGNOSTIC FINDINGS:  MRI showed small lesion 4 mm breast and had biopsy in July; normal but recommended another MRI and biopsy   PATIENT SURVEYS:  NDI 80% self disability score  COGNITION: Overall cognitive status: Within functional limits for tasks assessed  SENSATION: WFL  POSTURE: rounded shoulders  PALPATION: Tight bands and trigger points in R > L upper traps and rhomboids   CERVICAL ROM:   Active ROM A/PROM (deg) eval  Flexion WNL  Extension WNL  Right lateral flexion WNL  Left lateral flexion WNL  Right rotation WNL  Left rotation WNL   (Blank rows = not tested)   UPPER EXTREMITY MMT: 5/5 throughout bilateral upper extremities but patient states that she "is going to be so sore" from the muscle testing throughout the test.   TODAY'S TREATMENT:   10/8 Nu-Step L1 5 min while discussing status 5# overhead press 5x right/left Wall push up 8x Holding 5# weight with BOSU toe taps 5x right/left  Table planks 5 sec holds 3x Manual therapy to bil upper traps cervical musculature Trigger Point Dry-Needling  Treatment instructions: Expect mild to moderate muscle soreness. S/S of pneumothorax if dry needled over a lung  field, and to seek immediate medical attention should they occur. Patient verbalized understanding of these instructions and education.  Patient Consent Given: Yes Education handout provided: Previously provided Muscles treated: bil teres, lats; bil anterior upper trap approach, bil Upper trap posterior approach Electrical stimulation performed: No Parameters: N/A Treatment response/outcome: dec tender point size/number, twitch response and decreased muscle tension  Heat 2 min     10/3: 2nd step hip flexors series 4 reps each: UE raise, UE reach up and over, thoracic rotation with hand behind head Doorway lat stretch right /left with added trunk  rotation  Review of current HEP and appropriate hold times Manual therapy to bil upper traps, SCM and cervical musculature Trigger Point Dry-Needling  Treatment instructions: Expect mild to moderate muscle soreness. S/S of pneumothorax if dry needled over a lung field, and to seek immediate medical attention should they occur. Patient verbalized understanding of these instructions and education.  Patient Consent Given: Yes Education handout provided: Previously provided Muscles treated: bil anterior upper trap approach, left SCM, bil Upper trap posterior approach, bil cervical multifidi; suboccipitals Electrical stimulation performed: No Parameters: N/A Treatment response/outcome: dec tender point size/number, twitch response and decreased muscle tension  Heat 10 min     05/07/23 Nu-Step L1 4 min Discussed use of lumbar roll in the car for postural alignment Cervical retractions 8x Doorway pec stretch static hold 20 sec 2 sets of 3 at 3 hand levels (low medium and high) Lat bar standing 25# 10x Standing green band anchored over the top of the door: lat pulls 10x, bil shoulder extensions 10x Manual therapy to bil teres,  bil pectorals Trigger Point Dry-Needling  Treatment instructions: Expect mild to moderate muscle soreness. S/S of  pneumothorax if dry needled over a lung field, and to seek immediate medical attention should they occur. Patient verbalized understanding of these instructions and education.  Patient Consent Given: Yes Education handout provided: Previously provided Muscles treated: bil teres, bil pectorals Electrical stimulation performed: No Parameters: N/A Treatment response/outcome: dec tender point size/number, twitch response and decreased muscle tension  Heat 20 min to pectorals    DATE: 05/03/23 UBE 2:25 forward/backward Cable rows 5# 10x 5# dumbbell overhead press 10x alternating sides Supine pair of 3# dumbbells chest press 10x Discussed pulling ex's emphasizing middle and lower traps and lats to help lengthen pectorals Manual therapy to right teres, left upper traps,  left levator scap, bil pectorals Trigger Point Dry-Needling  Treatment instructions: Expect mild to moderate muscle soreness. S/S of pneumothorax if dry needled over a lung field, and to seek immediate medical attention should they occur. Patient verbalized understanding of these instructions and education.  Patient Consent Given: Yes Education handout provided: Previously provided Muscles treated: right lats,right teres, left upper traps, left levator scap;  bil pecs Electrical stimulation performed: No Parameters: N/A Treatment response/outcome: dec tender point size/number, twitch response and decreased muscle tension  Heat 2 min to pectorals Cervical retractions 5x PATIENT EDUCATION:  Education details: initiated HEP Person educated: Patient Education method: Programmer, multimedia, Facilities manager, Verbal cues, and Handouts Education comprehension: verbalized understanding, returned demonstration, and verbal cues required  HOME EXERCISE PROGRAM: Access Code: [email or web address] URL: [email or web address] Date: 05/14/2023 Prepared by: Lavinia Sharps  Exercises - Supine Chest Stretch on Foam Roll  - 1 x daily - 7 x weekly -  3 sets - 10 reps - Thoracic Extension Mobilization on Foam Roll  - 1 x daily - 7 x weekly - 3 sets - 10 reps - Supine Shoulder Horizontal Abduction with Resistance  - 1 x daily - 7 x weekly - 3 sets - 10 reps - Supine PNF D2 Flexion with Resistance  - 1 x daily - 7 x weekly - 3 sets - 10 reps - Latissimus Dorsi Stretch at Wall  - 2 x daily - 7 x weekly - 1 sets - 2-3 reps - 20 hold - Prone Chest Stretch on Chair  - 1 x daily - 7 x weekly - 1 sets - 2-3 reps - 20 hold - Standing Thoracic Open Book at Wall  - 1 x daily -  7 x weekly - 1 sets - 5 reps - Standing Cervical Retraction  - 3-4 x daily - 7 x weekly - 1 sets - 5 reps - Wall Push Up  - 1 x daily - 7 x weekly - 1 sets - 10 reps - Wall Angels  - 1 x daily - 7 x weekly - 1 sets - 10 reps - Shoulder Overhead Press in Flexion with Dumbbells  - 1 x daily - 7 x weekly - 2 sets - 5 reps - Squat with Chair Touch  - 1 x daily - 7 x weekly - 2 sets - 5 reps - Half Deadlift with Kettlebell  - 1 x daily - 7 x weekly - 2 sets - 5 reps - Farmer's Carry with Kettlebells  - 1 x daily - 7 x weekly - 1 sets - 10 reps - Standing Hip Circles  - 1 x daily - 7 x weekly - 1 sets - 10 reps - Seated Assisted Cervical Rotation with Towel  - 1 x daily - 7 x weekly - 1 sets - 10 reps - Cervical Extension AROM with Strap  - 1 x daily - 7 x weekly - 1 sets - 10 reps - Supine Diaphragmatic Breathing  - 1 x daily - 7 x weekly - 1 sets - 10 reps - Doorway Pec Stretch at 90 Degrees Abduction  - 1 x daily - 7 x weekly - 2 sets - 2 reps - 20 hold - Doorway Pec Stretch at 120 Degrees Abduction  - 1 x daily - 7 x weekly - 2 sets - 2 reps - 20 hold - Standing Quadratus Lumborum Stretch with Doorway  - 1 x daily - 7 x weekly - 2 sets - 2 reps - 20 hold - Standing Lat Pull Down with Resistance - Elbows Bent  - 1 x daily - 7 x weekly - 1 sets - 10 reps - Shoulder extension with resistance - Neutral  - 1 x daily - 7 x weekly - 1 sets - 10 reps - Standing Plank on Wall  - 1 x daily  - 7 x weekly - 1 sets - 5 reps - 5 hold - Standing Full Side Plank on Wall  - 1 x daily - 7 x weekly - 1 sets - 3 reps - 5 hold -ASSESSMENT:  CLINICAL IMPRESSION:  Anticipate a slower recovery secondary to longevity of the issue as well as repetitive, long duration of work activities.  She responds well to modified planks (elbows on counter height table) to activate core muscles.  Twitch responses in teres and upper trap muscles which is a positive sign of benefit.  Therapist monitoring response to all interventions and modifying treatment accordingly.    OBJECTIVE IMPAIRMENTS: increased fascial restrictions and increased muscle spasms.   ACTIVITY LIMITATIONS: carrying, lifting, bending, sitting, squatting, sleeping, transfers, bed mobility, bathing, dressing, and hygiene/grooming  PARTICIPATION LIMITATIONS: meal prep, cleaning, laundry, interpersonal relationship, driving, shopping, community activity, and occupation  PERSONAL FACTORS: Education, Past/current experiences, and 3+ comorbidities: anxiety , depression and chronic pain  are also affecting patient's functional outcome.   REHAB POTENTIAL: Fair due to   CLINICAL DECISION MAKING: Unstable/unpredictable  EVALUATION COMPLEXITY: High   GOALS: Goals reviewed with patient? Yes  SHORT TERM GOALS: Target date: 04/24/2023   Patient to report pain no greater than 2/10  Baseline:  Goal status: ongoing  2.  Patient will be independent with initial HEP  Baseline:  Goal status: met 9/17  LONG TERM GOALS: Target date: 05/22/2023   Patient to report pain no greater than 2/10  Baseline:  Goal status: INITIAL  2.  Patient to be independent with advanced HEP  Baseline:  Goal status: INITIAL  3.  Patient to be able to sleep through the night  Baseline:  Goal status: INITIAL  4.  Patient to be able to return to work Baseline:  Goal status: INITIAL  5.  Neck pain disablity score to improve by 5 Baseline:  Goal status:  INITIAL  6.  Patient to report 85% improvement in overall symptoms  Baseline:  Goal status: INITIAL   PLAN:  PT FREQUENCY: 1-2x/week  PT DURATION: 8 weeks  PLANNED INTERVENTIONS: Therapeutic exercises, Therapeutic activity, Neuromuscular re-education, Balance training, Gait training, Patient/Family education, Self Care, Joint mobilization, Vestibular training, Canalith repositioning, Aquatic Therapy, Dry Needling, Electrical stimulation, Spinal mobilization, Cryotherapy, Moist heat, Taping, Vasopneumatic device, Traction, Ultrasound, Ionotophoresis 4mg /ml Dexamethasone, and Manual therapy  PLAN FOR NEXT SESSION: add side/lateral wall planks;  DN;  UBE, review HEP, progress postural strengthening including resistance training upper quarter  Lavinia Sharps, PT 05/14/23 10:18 PM Phone: [phone] Fax: [phone]

## 2023-05-16 DIAGNOSIS — R002 Palpitations: Secondary | ICD-10-CM | POA: Diagnosis not present

## 2023-05-16 DIAGNOSIS — R000 Tachycardia, unspecified: Secondary | ICD-10-CM | POA: Diagnosis not present

## 2023-05-17 ENCOUNTER — Ambulatory Visit: Payer: Managed Care, Other (non HMO) | Admitting: Physical Therapy

## 2023-05-17 DIAGNOSIS — M6281 Muscle weakness (generalized): Secondary | ICD-10-CM

## 2023-05-17 DIAGNOSIS — M542 Cervicalgia: Secondary | ICD-10-CM

## 2023-05-17 DIAGNOSIS — M62838 Other muscle spasm: Secondary | ICD-10-CM

## 2023-05-17 DIAGNOSIS — M546 Pain in thoracic spine: Secondary | ICD-10-CM | POA: Diagnosis not present

## 2023-05-17 NOTE — Therapy (Signed)
OUTPATIENT PHYSICAL THERAPY CERVICAL TREATMENT NOTE   Patient Name: Adrienne Williams MRN: [id number] DOB:1981-03-13, 42 y.o., female Today's Date: 05/17/2023  END OF SESSION:  PT End of Session - 05/17/23 0850     Visit Number 13    Date for PT Re-Evaluation 05/22/23    Authorization Type [employer]    PT Start Time 0847    PT Stop Time 0930    PT Time Calculation (min) 43 min    Activity Tolerance Patient tolerated treatment well              Past Medical History:  Diagnosis Date   Anxiety    Asthma    Depression    Family history of breast cancer    Homero Fellers breech presentation 05/29/2018   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   Hemorrhoids    Hx of varicella    PONV (postoperative nausea and vomiting)    "little nauseated"   Postpartum care following cesarean delivery (6/14) 01/18/2016   Past Surgical History:  Procedure Laterality Date   BREAST BIOPSY Right 02/25/2023   CESAREAN SECTION N/A 01/18/2016   Procedure: Primary CESAREAN SECTION;  Surgeon: Genia Del, MD;  Location: [facility];  Service: Obstetrics;  Laterality: N/A;  EDD: 01/25/16   CESAREAN SECTION N/A 05/29/2018   Procedure: Repeat CESAREAN SECTION;  Surgeon: Shea Evans, MD;  Location: [facility];  Service: Obstetrics;  Laterality: N/A;  EDD: 06/10/18   COLONOSCOPY     CYSTECTOMY  2002   pilonidal region   DIAGNOSTIC LAPAROSCOPY WITH REMOVAL OF ECTOPIC PREGNANCY Right 05/29/2020   Procedure: DIAGNOSTIC LAPAROSCOPY WITH REMOVAL OF ECTOPIC PREGNANCY;  Surgeon: Shea Evans, MD;  Location: [facility] OR;  Service: Gynecology;  Laterality: Right;   MOLE REMOVAL     UNILATERAL SALPINGECTOMY Right 05/29/2020   Procedure: UNILATERAL SALPINGECTOMY;  Surgeon: Shea Evans, MD;  Location: [facility] OR;  Service: Gynecology;  Laterality: Right;   WISDOM TOOTH EXTRACTION     Patient Active Problem List   Diagnosis Date Noted   Neck pain, chronic 06/27/2022   Fibromyalgia  06/27/2022   Myofascial pain 06/27/2022   Internal hemorrhoids 06/13/2022   Rectal bleeding 06/13/2022   Family history of breast cancer 01/18/2022   Monoallelic mutation of CHEK2 gene in female patient 01/18/2022   Anxiety and depression 05/31/2018   Rh negative, maternal 05/31/2018   Homero Fellers breech presentation 05/29/2018   Status post repeat low transverse cesarean section 05/29/2018   Postpartum care following cesarean delivery (10/24) 05/29/2018    PCP: Jarrett Soho, PA-C   REFERRING PROVIDER: Jackelyn Poling, DO  REFERRING DIAG: [phone] (ICD-10-CM) - Other muscle spasm  THERAPY DIAG:  Muscle weakness (generalized)  Other muscle spasm  Cervicalgia  Rationale for Evaluation and Treatment: Rehabilitation  ONSET DATE: 03/26/2023  SUBJECTIVE:  SUBJECTIVE STATEMENT: I almost cancelled today.  I'm tired and I think I need to do exercises in sitting today.   EVAL: Patient reports she was helping her husband install garage seal on 03-10-23.  She felt severe pain in thoracic spine and neck onset around 03-13-23 and explains that she noticed that she had a lot of swelling and had to get a bigger bra and extender for her bra due to the swelling.  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  Difficulty with driving, sleeping, IADL's self care and working.  I have two kids and caring for them and the dogs has been very challenging.  I am usually very active but unable to do anything right now.  Hand dominance: Right  PERTINENT HISTORY:  na  PAIN:  PAIN:  Are you having pain? Yes NPRS scale:  5/10 Pain location: my lower body bothers me more than neck pain  Aggravating factors: stress; overstretching Relieving factors: DN   PRECAUTIONS: None  RED  FLAGS: None     WEIGHT BEARING RESTRICTIONS: No  FALLS:  Has patient fallen in last 6 months? No  LIVING ENVIRONMENT: Lives with: lives with their family and lives with their spouse Lives in: House/apartment   OCCUPATION: Pharmacist  PLOF: Independent, Independent with basic ADLs, Independent with household mobility without device, Independent with community mobility without device, Independent with homemaking with ambulation, Independent with gait, and Independent with transfers  PATIENT GOALS: to eliminate pain and be able to work and do my usual daily activities and sleep without interruption  NEXT MD VISIT: 04/10/23  OBJECTIVE:   DIAGNOSTIC FINDINGS:  MRI showed small lesion 4 mm breast and had biopsy in July; normal but recommended another MRI and biopsy   PATIENT SURVEYS:  NDI 80% self disability score  COGNITION: Overall cognitive status: Within functional limits for tasks assessed  SENSATION: WFL  POSTURE: rounded shoulders  PALPATION: Tight bands and trigger points in R > L upper traps and rhomboids   CERVICAL ROM:   Active ROM A/PROM (deg) eval  Flexion WNL  Extension WNL  Right lateral flexion WNL  Left lateral flexion WNL  Right rotation WNL  Left rotation WNL   (Blank rows = not tested)   UPPER EXTREMITY MMT: 5/5 throughout bilateral upper extremities but patient states that she "is going to be so sore" from the muscle testing throughout the test.   TODAY'S TREATMENT:   10/11 Seated thoracic extension with ball 8x Seated holding purple Pilates ball lateral stretch 8x each way Seated open books holding Pilates ball 8x each way Cervical SNAG with towel for rotation 8x right/left Seated 4# yellow plyoball: Vs, hip to hip,  small chops each way 8x each  Nu-Step L5  ( green machine) 5 min  Standing cable row 10# 10x Seated red band triceps extension 10x right/left Standing red band lat pulls 12x   10/8 Nu-Step L1 5 min while discussing  status 5# overhead press 5x right/left Wall push up 8x Holding 5# weight with BOSU toe taps 5x right/left  Table planks 5 sec holds 3x Manual therapy to bil upper traps cervical musculature Trigger Point Dry-Needling  Treatment instructions: Expect mild to moderate muscle soreness. S/S of pneumothorax if dry needled over a lung field, and to seek immediate medical attention should they occur. Patient verbalized understanding of these instructions and education.  Patient Consent Given: Yes Education handout provided: Previously provided Muscles treated: bil teres, lats;  bil anterior upper trap approach, bil Upper trap posterior approach Electrical stimulation performed: No Parameters: N/A Treatment response/outcome: dec tender point size/number, twitch response and decreased muscle tension  Heat 2 min     10/3: 2nd step hip flexors series 4 reps each: UE raise, UE reach up and over, thoracic rotation with hand behind head Doorway lat stretch right /left with added trunk rotation  Review of current HEP and appropriate hold times Manual therapy to bil upper traps, SCM and cervical musculature Trigger Point Dry-Needling  Treatment instructions: Expect mild to moderate muscle soreness. S/S of pneumothorax if dry needled over a lung field, and to seek immediate medical attention should they occur. Patient verbalized understanding of these instructions and education.  Patient Consent Given: Yes Education handout provided: Previously provided Muscles treated: bil anterior upper trap approach, left SCM, bil Upper trap posterior approach, bil cervical multifidi; suboccipitals Electrical stimulation performed: No Parameters: N/A Treatment response/outcome: dec tender point size/number, twitch response and decreased muscle tension  Heat 10 min     05/07/23 Nu-Step L1 4 min Discussed use of lumbar roll in the car for postural alignment Cervical retractions 8x Doorway pec stretch static hold 20  sec 2 sets of 3 at 3 hand levels (low medium and high) Lat bar standing 25# 10x Standing green band anchored over the top of the door: lat pulls 10x, bil shoulder extensions 10x Manual therapy to bil teres,  bil pectorals Trigger Point Dry-Needling  Treatment instructions: Expect mild to moderate muscle soreness. S/S of pneumothorax if dry needled over a lung field, and to seek immediate medical attention should they occur. Patient verbalized understanding of these instructions and education.  Patient Consent Given: Yes Education handout provided: Previously provided Muscles treated: bil teres, bil pectorals Electrical stimulation performed: No Parameters: N/A Treatment response/outcome: dec tender point size/number, twitch response and decreased muscle tension  Heat 20 min to pectorals   PATIENT EDUCATION:  Education details: initiated HEP Person educated: Patient Education method: Programmer, multimedia, Facilities manager, Verbal cues, and Handouts Education comprehension: verbalized understanding, returned demonstration, and verbal cues required  HOME EXERCISE PROGRAM: Access Code: [email or web address] URL: [email or web address] Date: 05/14/2023 Prepared by: Lavinia Sharps  Exercises - Supine Chest Stretch on Foam Roll  - 1 x daily - 7 x weekly - 3 sets - 10 reps - Thoracic Extension Mobilization on Foam Roll  - 1 x daily - 7 x weekly - 3 sets - 10 reps - Supine Shoulder Horizontal Abduction with Resistance  - 1 x daily - 7 x weekly - 3 sets - 10 reps - Supine PNF D2 Flexion with Resistance  - 1 x daily - 7 x weekly - 3 sets - 10 reps - Latissimus Dorsi Stretch at Wall  - 2 x daily - 7 x weekly - 1 sets - 2-3 reps - 20 hold - Prone Chest Stretch on Chair  - 1 x daily - 7 x weekly - 1 sets - 2-3 reps - 20 hold - Standing Thoracic Open Book at Wall  - 1 x daily - 7 x weekly - 1 sets - 5 reps - Standing Cervical Retraction  - 3-4 x daily - 7 x weekly - 1 sets - 5 reps - Wall Push Up  - 1 x daily  - 7 x weekly - 1 sets - 10 reps - Wall Angels  - 1 x daily - 7 x weekly - 1 sets - 10 reps - Shoulder Overhead Press in Flexion with Dumbbells  - 1  x daily - 7 x weekly - 2 sets - 5 reps - Squat with Chair Touch  - 1 x daily - 7 x weekly - 2 sets - 5 reps - Half Deadlift with Kettlebell  - 1 x daily - 7 x weekly - 2 sets - 5 reps - Farmer's Carry with Kettlebells  - 1 x daily - 7 x weekly - 1 sets - 10 reps - Standing Hip Circles  - 1 x daily - 7 x weekly - 1 sets - 10 reps - Seated Assisted Cervical Rotation with Towel  - 1 x daily - 7 x weekly - 1 sets - 10 reps - Cervical Extension AROM with Strap  - 1 x daily - 7 x weekly - 1 sets - 10 reps - Supine Diaphragmatic Breathing  - 1 x daily - 7 x weekly - 1 sets - 10 reps - Doorway Pec Stretch at 90 Degrees Abduction  - 1 x daily - 7 x weekly - 2 sets - 2 reps - 20 hold - Doorway Pec Stretch at 120 Degrees Abduction  - 1 x daily - 7 x weekly - 2 sets - 2 reps - 20 hold - Standing Quadratus Lumborum Stretch with Doorway  - 1 x daily - 7 x weekly - 2 sets - 2 reps - 20 hold - Standing Lat Pull Down with Resistance - Elbows Bent  - 1 x daily - 7 x weekly - 1 sets - 10 reps - Shoulder extension with resistance - Neutral  - 1 x daily - 7 x weekly - 1 sets - 10 reps - Standing Plank on Wall  - 1 x daily - 7 x weekly - 1 sets - 5 reps - 5 hold - Standing Full Side Plank on Wall  - 1 x daily - 7 x weekly - 1 sets - 3 reps - 5 hold -ASSESSMENT:  CLINICAL IMPRESSION: Treatment modified secondary based on complaints of extreme fatigue from long work hours and family obligations.  Treatment focus on thoracic and cervical mobility as well as muscle pumping for pain modulation.  Particularly targeting posterior chain muscles.  Verbal cues for low reps and slow movements. The patient states, "I'm glad I came today."   OBJECTIVE IMPAIRMENTS: increased fascial restrictions and increased muscle spasms.   ACTIVITY LIMITATIONS: carrying, lifting, bending,  sitting, squatting, sleeping, transfers, bed mobility, bathing, dressing, and hygiene/grooming  PARTICIPATION LIMITATIONS: meal prep, cleaning, laundry, interpersonal relationship, driving, shopping, community activity, and occupation  PERSONAL FACTORS: Education, Past/current experiences, and 3+ comorbidities: anxiety , depression and chronic pain  are also affecting patient's functional outcome.   REHAB POTENTIAL: Fair due to   CLINICAL DECISION MAKING: Unstable/unpredictable  EVALUATION COMPLEXITY: High   GOALS: Goals reviewed with patient? Yes  SHORT TERM GOALS: Target date: 04/24/2023   Patient to report pain no greater than 2/10  Baseline:  Goal status: ongoing  2.  Patient will be independent with initial HEP  Baseline:  Goal status: met 9/17    LONG TERM GOALS: Target date: 05/22/2023   Patient to report pain no greater than 2/10  Baseline:  Goal status: INITIAL  2.  Patient to be independent with advanced HEP  Baseline:  Goal status: INITIAL  3.  Patient to be able to sleep through the night  Baseline:  Goal status: INITIAL  4.  Patient to be able to return to work Baseline:  Goal status: INITIAL  5.  Neck pain disablity score to improve by  5 Baseline:  Goal status: INITIAL  6.  Patient to report 85% improvement in overall symptoms  Baseline:  Goal status: INITIAL   PLAN:  PT FREQUENCY: 1-2x/week  PT DURATION: 8 weeks  PLANNED INTERVENTIONS: Therapeutic exercises, Therapeutic activity, Neuromuscular re-education, Balance training, Gait training, Patient/Family education, Self Care, Joint mobilization, Vestibular training, Canalith repositioning, Aquatic Therapy, Dry Needling, Electrical stimulation, Spinal mobilization, Cryotherapy, Moist heat, Taping, Vasopneumatic device, Traction, Ultrasound, Ionotophoresis 4mg /ml Dexamethasone, and Manual therapy  PLAN FOR NEXT SESSION: ERO; add side/lateral wall planks;  DN;  UBE, review HEP, progress  postural strengthening including resistance training upper quarter  Lavinia Sharps, PT 05/17/23 9:37 AM Phone: [phone] Fax: [phone]

## 2023-05-20 NOTE — Progress Notes (Signed)
Subjective:    Patient ID: Adrienne Williams, female    DOB: 04-19-1981, 42 y.o.   MRN: [id number]  HPI  Adrienne Williams is a 42 y.o. year old female  who  has a past medical history of Anxiety, Asthma, Depression, Family history of breast cancer, Frank breech presentation (05/29/2018), Gene mutation, Hemorrhoids, varicella, PONV (postoperative nausea and vomiting), and Postpartum care following cesarean delivery (6/14) (01/18/2016).   They are presenting to PM&R clinic for follow up related to fibromyalgia .  Plan from last visit:  Neck pain, chronic Assessment & Plan: I have refilled your Baclofen and prescribed Duloxetine at increase dose for 90 days R1.    For duloxetine, Take 2 capsules (60 mg total) by mouth 2 (two) times daily. Start with 2 capsules in the morning and one capsule at nighttime for 2 weeks. If tolerating well, increase to 2 capsules twice daily. If any concerns regarding medication side effects, call your doctor's office and do not stop the medication without tapering      Myofascial pain Assessment & Plan: Continue physical activity as tolerated and adjunctive treatments like massage with theracane and accupunture.   I will see you in 2 weeks for trigger point injecitons and 3 months for a general follow up     Anxiety and depression Assessment & Plan: Well controlled on current regimen, no current symptoms concerning for serotonin syndrome but will need to monitor for this on multiple antidepressants   Interval Hx:  - Therapies: Ongoing PT, 'last note: "The patient has been able to return to work (3 weeks ago). She reports decreased intensity of pain from severe to moderate with self rated improvement since start of care at 30-40%. Neck Disability Index has improved from 80% limitation to 54%. She is limited in stamina to endure job tasks for long shifts but also with driving her kids to and from school and doing housework for extended periods of time. The  patient would benefit from a continuation of skilled PT for a further progression of strengthening and functional mobility. ". Also about to start pelvic PT for her C section scar.    - Follow ups: 2x TPI since last visit - doing dry needling with PT, mostly back, neck, lats.    - Recently helped her husband install a garage door sealer after gardening, with a lot of overhead lifting - 5 days afterward she got lots of swelling in her back and shoulders - the fluid came off quickly but after that severe pain kicked in. She was not functional at that point and took off work, then got COVID at PT, ended up taking about 6 weeks off work. Is back at this point.   She works as a Engineer, drilling, and work has been "terrible", getting in trouble for taking breaks. She is back at work for 4 weeks, and by the end of the day cannot open prescription bottles.    - Medications: Still taking Duloxetine 60 mg BID. She is using PRN baclofen rarely but avoids it because she feels it effects her posture. Was taking Voltaren biofreezes.   She is seeing her psychiatrist regularly and is having lots of anxiety, recently started on a heart monitor for palpitations associated with this.    - Other concerns: Has been significnatly helped by meditation since her last visit. She is limited by time for this, but says it helps a LOT with pain control.   She says "if I don't listen to  my body, I know it is going to be a bad day."   Stressors are well controlled now; STD was an issue initially but just got paid for it. Youngest daughter switched schools due to bullying. Is meditation and ambien at nighttime.   Pain Inventory Average Pain 6 Pain Right Now 4 My pain is constant and pulse  In the last 24 hours, has pain interfered with the following? General activity 7 Relation with others 7 Enjoyment of life 7 What TIME of day is your pain at its worst? evening Sleep (in general) Fair  Pain is worse with: walking,  sitting, standing, and some activites Pain improves with: rest and medication Relief from Meds:  fair  Family History  Problem Relation Age of Onset   ADD / ADHD Brother    Breast cancer Maternal Grandmother 20   Cancer Maternal Grandmother        stomach, possible met from breast cancer   Hypothyroidism Paternal Grandmother    Atrial fibrillation Paternal Grandmother    Angina Paternal Grandfather    Hypertension Paternal Grandfather    Hyperlipidemia Paternal Grandfather    Colon polyps Paternal Grandfather    Colon cancer Neg Hx    Esophageal cancer Neg Hx    Social History   Socioeconomic History   Marital status: Married    Spouse name: Not on file   Number of children: 2   Years of education: Not on file   Highest education level: Not on file  Occupational History   Occupation: Pharmacist  Tobacco Use   Smoking status: Never   Smokeless tobacco: Never  Vaping Use   Vaping status: Never Used  Substance and Sexual Activity   Alcohol use: No   Drug use: No   Sexual activity: Yes  Other Topics Concern   Not on file  Social History Narrative   Not on file   Social Determinants of Health   Financial Resource Strain: Low Risk  (05/26/2018)   Overall Financial Resource Strain (CARDIA)    Difficulty of Paying Living Expenses: Not hard at all  Food Insecurity: No Food Insecurity (05/26/2018)   Hunger Vital Sign    Worried About Running Out of Food in the Last Year: Never true    Ran Out of Food in the Last Year: Never true  Transportation Needs: Unknown (05/26/2018)   PRAPARE - Administrator, Civil Service (Medical): No    Lack of Transportation (Non-Medical): Not on file  Physical Activity: Not on file  Stress: Stress Concern Present (05/26/2018)   [employer] of Occupational Health - Occupational Stress Questionnaire    Feeling of Stress : To some extent  Social Connections: Not on file   Past Surgical History:  Procedure Laterality Date    BREAST BIOPSY Right 02/25/2023   CESAREAN SECTION N/A 01/18/2016   Procedure: Primary CESAREAN SECTION;  Surgeon: Genia Del, MD;  Location: [facility];  Service: Obstetrics;  Laterality: N/A;  EDD: 01/25/16   CESAREAN SECTION N/A 05/29/2018   Procedure: Repeat CESAREAN SECTION;  Surgeon: Shea Evans, MD;  Location: [facility];  Service: Obstetrics;  Laterality: N/A;  EDD: 06/10/18   COLONOSCOPY     CYSTECTOMY  2002   pilonidal region   DIAGNOSTIC LAPAROSCOPY WITH REMOVAL OF ECTOPIC PREGNANCY Right 05/29/2020   Procedure: DIAGNOSTIC LAPAROSCOPY WITH REMOVAL OF ECTOPIC PREGNANCY;  Surgeon: Shea Evans, MD;  Location: [facility] OR;  Service: Gynecology;  Laterality: Right;   MOLE REMOVAL  UNILATERAL SALPINGECTOMY Right 05/29/2020   Procedure: UNILATERAL SALPINGECTOMY;  Surgeon: Shea Evans, MD;  Location: [facility] OR;  Service: Gynecology;  Laterality: Right;   WISDOM TOOTH EXTRACTION     Past Surgical History:  Procedure Laterality Date   BREAST BIOPSY Right 02/25/2023   CESAREAN SECTION N/A 01/18/2016   Procedure: Primary CESAREAN SECTION;  Surgeon: Genia Del, MD;  Location: [facility];  Service: Obstetrics;  Laterality: N/A;  EDD: 01/25/16   CESAREAN SECTION N/A 05/29/2018   Procedure: Repeat CESAREAN SECTION;  Surgeon: Shea Evans, MD;  Location: [facility];  Service: Obstetrics;  Laterality: N/A;  EDD: 06/10/18   COLONOSCOPY     CYSTECTOMY  2002   pilonidal region   DIAGNOSTIC LAPAROSCOPY WITH REMOVAL OF ECTOPIC PREGNANCY Right 05/29/2020   Procedure: DIAGNOSTIC LAPAROSCOPY WITH REMOVAL OF ECTOPIC PREGNANCY;  Surgeon: Shea Evans, MD;  Location: [facility] OR;  Service: Gynecology;  Laterality: Right;   MOLE REMOVAL     UNILATERAL SALPINGECTOMY Right 05/29/2020   Procedure: UNILATERAL SALPINGECTOMY;  Surgeon: Shea Evans, MD;  Location: [facility] OR;  Service: Gynecology;  Laterality: Right;   WISDOM TOOTH EXTRACTION     Past Medical History:   Diagnosis Date   Anxiety    Asthma    Depression    Family history of breast cancer    Frank breech presentation 05/29/2018   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  Opioid Risk Score:   Fall Risk Score:  `1  Depression screen [facility] 2/9     09/26/2022    9:37 AM 06/27/2022    9:20 AM  Depression screen PHQ 2/9  Decreased Interest 1 2  Down, Depressed, Hopeless 1 3  PHQ - 2 Score 2 5  Altered sleeping  2  Tired, decreased energy  1  Change in appetite  1  Feeling bad or failure about yourself   1  Trouble concentrating  1  Moving slowly or fidgety/restless  0  Suicidal thoughts  0  PHQ-9 Score  11  Difficult doing work/chores  Somewhat difficult    Review of Systems  Musculoskeletal:  Positive for neck pain.       Pain in both shoulders  Pelvis pain  All other systems reviewed and are negative.      Objective:   Physical Exam   Constitution: Appropriate appearance for age. No apparent distress    HEENT: PERRL, EOMI grossly intact.  Resp: CTAB. No rales, rhonchi, or wheezing. Cardio: RRR. No mumurs, rubs, or gallops. No peripheral edema. +HR monitor Abdomen: Nondistended.  Psych: Mood mildly anxious, appropriate affect. Unchanged from prior exams.  Neuro: AAOx4. No apparent deficits Sensation intact throughout  MSK: 5/5 strength bilateral grip, finger abduction, elbow flexion/extension, and shoulder abduction.   Cervical Exam:  Inspection: Shoulder girdles were even. The cervical lordotic curvature was  WNL. Unchanged  ROM: Flexion  WNL, Extension  WNL, Rotation  WNL b/l, Sidebending WNL b/l.   Palpatory exam: Ropey, tight texture and ttp at the bilateral cervical paraspinals, traps, levator scapulae, medial scapula, thoracic  parqaspinals and SCM muscles. There was no  muscle spasm.          Assessment & Plan:   Adrienne Williams is a 42 y.o. year  old female  who  has a past medical history of Anxiety, Asthma, Depression, Family history of breast cancer, Frank breech presentation (05/29/2018), Gene mutation, Hemorrhoids, varicella, PONV (postoperative nausea and vomiting), and Postpartum care following cesarean delivery (6/14) (01/18/2016).   They are presenting to PM&R clinic as a follow up for fibromyalgia pain.   Fibromyalgia I will message you once your naltrexone script is sent in to a compounding pharmacy; cost is generally between $30-70 per month out of pocket. This will most likely be filled at [facility] or [facility].   Start low dose naltrexone at 1.5 mg dose (1 capsule) on an empty stomach at night for 1 week. If no side effects, increase to 3 mg (2 capsules) at nighttime for 3 weeks. You will need to call the office or message me through mychart at week 2-3 and report effects before I will prescribe the medication further; based on your response, we may keep 3 mg nightly or increase up to 4.5 mg nightly.   This is NOT an as needed medication, you must take it CONSISTENTLY to appreciate a benefit, but it can be stopped safely without weaning if needed. Call clinic if any questions   If you do not tolerate this medicaiton, we may do a short course of tramadol #15 tabs for severe pain only.   I have refilled your duloxetine 60 mg BID and your baclofen 5-10 mg three times daily as needed.  Continue staying active, meditating and attenting PT as discussed; you are doing awesome here! Stay aware of your activity level and try not to overdo it if possible.   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   Follow up in 1 month  Myofascial pain Neck pain, chronic Does best with dry needling with PT rather  than trigger points; continue this  Today, I provided you a handout on dietary interventions for pain and anxiety emphasizing low inflammatory diet  Anxiety and depression Seeing psychiatrist regularly, some external stressors are improving and I anticipate this will help with pain as well.   Poor sleep  For sleep, I want you to pick a time to lay down every night, ideally between 8 and 10 PM.    Starting 1 hour before you want to go to sleep, turn off all television screens, phone screens, tablets, and computers.    Keep the lights low and perform only low stimulation activities, such as reading.    Only use your bedroom for sleep and sex.    You may also take 3 to 5 mg of over-the-counter melatonin approximately 1 hour before bedtime or your prescribed ambien.

## 2023-05-21 ENCOUNTER — Ambulatory Visit: Payer: Managed Care, Other (non HMO) | Admitting: Physical Therapy

## 2023-05-21 DIAGNOSIS — M546 Pain in thoracic spine: Secondary | ICD-10-CM | POA: Diagnosis not present

## 2023-05-21 DIAGNOSIS — M542 Cervicalgia: Secondary | ICD-10-CM

## 2023-05-21 DIAGNOSIS — M62838 Other muscle spasm: Secondary | ICD-10-CM

## 2023-05-21 DIAGNOSIS — M6281 Muscle weakness (generalized): Secondary | ICD-10-CM

## 2023-05-21 NOTE — Therapy (Signed)
OUTPATIENT PHYSICAL THERAPY CERVICAL TREATMENT NOTE/RECERTIFICATION   Patient Name: Adrienne Williams MRN: [id number] DOB:[birth year], 42 y.o., female Today's Date: 05/21/2023  END OF SESSION:  PT End of Session - 05/21/23 1537     Visit Number 14    Date for PT Re-Evaluation 07/30/23    Authorization Type [employer]    PT Start Time 1531    PT Stop Time 1614    PT Time Calculation (min) 43 min    Activity Tolerance Patient tolerated treatment well              Past Medical History:  Diagnosis Date   Anxiety    Asthma    Depression    Family history of breast cancer    Frank breech presentation 05/29/2018   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   Hemorrhoids    Hx of varicella    PONV (postoperative nausea and vomiting)    "little nauseated"   Postpartum care following cesarean delivery (6/14) 01/18/2016   Past Surgical History:  Procedure Laterality Date   BREAST BIOPSY Right 02/25/2023   CESAREAN SECTION N/A 01/18/2016   Procedure: Primary CESAREAN SECTION;  Surgeon: Genia Del, MD;  Location: [facility];  Service: Obstetrics;  Laterality: N/A;  EDD: 01/25/16   CESAREAN SECTION N/A 05/29/2018   Procedure: Repeat CESAREAN SECTION;  Surgeon: Shea Evans, MD;  Location: [facility];  Service: Obstetrics;  Laterality: N/A;  EDD: 06/10/18   COLONOSCOPY     CYSTECTOMY  2002   pilonidal region   DIAGNOSTIC LAPAROSCOPY WITH REMOVAL OF ECTOPIC PREGNANCY Right 05/29/2020   Procedure: DIAGNOSTIC LAPAROSCOPY WITH REMOVAL OF ECTOPIC PREGNANCY;  Surgeon: Shea Evans, MD;  Location: [facility] OR;  Service: Gynecology;  Laterality: Right;   MOLE REMOVAL     UNILATERAL SALPINGECTOMY Right 05/29/2020   Procedure: UNILATERAL SALPINGECTOMY;  Surgeon: Shea Evans, MD;  Location: [facility] OR;  Service: Gynecology;  Laterality: Right;   WISDOM TOOTH EXTRACTION     Patient Active Problem List   Diagnosis Date Noted   Neck pain, chronic 06/27/2022    Fibromyalgia 06/27/2022   Myofascial pain 06/27/2022   Internal hemorrhoids 06/13/2022   Rectal bleeding 06/13/2022   Family history of breast cancer 01/18/2022   Monoallelic mutation of CHEK2 gene in female patient 01/18/2022   Anxiety and depression 05/31/2018   Rh negative, maternal 05/31/2018   Homero Fellers breech presentation 05/29/2018   Status post repeat low transverse cesarean section 05/29/2018   Postpartum care following cesarean delivery (10/24) 05/29/2018    PCP: Jarrett Soho, PA-C   REFERRING PROVIDER: Jackelyn Poling, DO  REFERRING DIAG: [phone] (ICD-10-CM) - Other muscle spasm  THERAPY DIAG:  Muscle weakness (generalized)  Other muscle spasm  Cervicalgia  Rationale for Evaluation and Treatment: Rehabilitation  ONSET DATE: 03/26/2023  SUBJECTIVE:  SUBJECTIVE STATEMENT: My strength is better but not my stamina.  40-50% better overall.  It still consumes part of my day but the pain is moderate rather than severe.  EVAL: Patient reports she was helping her husband install garage seal on 03-10-23.  She felt severe pain in thoracic spine and neck onset around 03-13-23 and explains that she noticed that she had a lot of swelling and had to get a bigger bra and extender for her bra due to the swelling.  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  Difficulty with driving, sleeping, IADL's self care and working.  I have two kids and caring for them and the dogs has been very challenging.  I am usually very active but unable to do anything right now.  Hand dominance: Right  PERTINENT HISTORY:  na  PAIN:  PAIN:  Are you having pain? Yes NPRS scale:  5/10 Pain location: my lower body bothers me more than neck pain  Aggravating factors: stress;  overstretching Relieving factors: DN   PRECAUTIONS: None  RED FLAGS: None     WEIGHT BEARING RESTRICTIONS: No  FALLS:  Has patient fallen in last 6 months? No  LIVING ENVIRONMENT: Lives with: lives with their family and lives with their spouse Lives in: House/apartment   OCCUPATION: Pharmacist  PLOF: Independent, Independent with basic ADLs, Independent with household mobility without device, Independent with community mobility without device, Independent with homemaking with ambulation, Independent with gait, and Independent with transfers  PATIENT GOALS: to eliminate pain and be able to work and do my usual daily activities and sleep without interruption The Patient-Specific Functional Scale  Initial:  I am going to ask you to identify up to 3 important activities that you are unable to do or are having difficulty with as a result of this problem.  Today are there any activities that you are unable to do or having difficulty with because of this?  (Patient shown scale and patient rated each activity)  Follow up: When you first came in you had difficulty performing these activities.  Today do you still have difficulty?  Patient-Specific activity scoring scheme (Point to one number):  0 1 2 3 4 5 6 7 8 9  10 Unable                                                                                                          Able to perform To perform  activity at the same Activity         Level as before                                                                                                                       Injury or problem  Activity       Stamina to work all day                                                                       10/15     5                   2.             Driving the kids to school and picking up                                    10/15            6                       3.            Doing extended housework                                                            10/15                4    NEXT MD VISIT: 04/10/23  OBJECTIVE:   DIAGNOSTIC FINDINGS:  MRI showed small lesion 4 mm breast and had biopsy in July; normal but recommended another MRI and biopsy   PATIENT SURVEYS:  NDI 80% self disability score  NDI 10/15:  54%  COGNITION: Overall cognitive status: Within functional limits for tasks assessed  SENSATION: WFL  POSTURE: rounded shoulders  PALPATION: Tight bands and trigger points in R > L upper traps and rhomboids   CERVICAL ROM:   Active ROM A/PROM (deg) eval 10/15  Flexion WNL WNL  Extension WNL WNL  Right lateral flexion WNL WNL  Left lateral flexion WNL WNL  Right rotation WNL WNL  Left rotation WNL WNL   (Blank rows = not tested)   UPPER EXTREMITY MMT: 5/5 throughout bilateral upper extremities but patient states that she "is going to be so sore" from the muscle testing throughout the test.   TODAY'S TREATMENT:   10/15 Nu-Step L5  ( green machine) 5 min NDI  PSFS Standing cable row 10# 10x Wall clocks with green loop 7x right/left Standing red band lat pulls 12x   Green band loop steering wheels 10x  Manual therapy to bil upper traps cervical musculature Trigger Point Dry-Needling  Treatment instructions: Expect mild to moderate muscle soreness. S/S of pneumothorax if dry needled over a lung field, and to seek immediate medical attention should they occur. Patient verbalized understanding of these instructions and education.  Patient Consent Given: Yes Education handout provided: Previously provided Muscles treated: bil teres, lats; bil upper trap  Electrical stimulation performed: No Parameters: N/A Treatment response/outcome: dec tender point size/number, twitch response and decreased muscle tension  Heat 2 min     10/11 Seated thoracic extension with ball 8x Seated holding purple  Pilates ball lateral stretch 8x each way Seated open books holding Pilates ball 8x each way Cervical SNAG with towel for rotation 8x right/left Seated 4# yellow plyoball: Vs, hip to hip,  small chops each way 8x each  Nu-Step L5  ( green machine) 5 min  Standing cable row 10# 10x Seated red band triceps extension 10x right/left Standing red band lat pulls 12x   10/8 Nu-Step L1 5 min while discussing status 5# overhead press 5x right/left Wall push up 8x Holding 5# weight with BOSU toe taps 5x right/left  Table planks 5 sec holds 3x Manual therapy to bil upper traps cervical musculature Trigger Point Dry-Needling  Treatment instructions: Expect mild to moderate muscle soreness. S/S of pneumothorax if dry needled over a lung field, and to seek immediate medical attention should they occur. Patient verbalized understanding of these instructions and education.  Patient Consent Given: Yes Education handout provided: Previously provided Muscles treated: bil teres, lats; bil anterior upper trap approach, bil Upper trap posterior approach Electrical stimulation performed: No Parameters: N/A Treatment response/outcome: dec tender point size/number, twitch response and decreased muscle tension  Heat 2 min     10/3: 2nd step hip flexors series 4 reps each: UE raise, UE reach up and over, thoracic rotation with hand behind head Doorway lat stretch right /left with added trunk rotation  Review of current HEP and appropriate hold times Manual therapy to bil upper traps, SCM and cervical musculature Trigger Point Dry-Needling  Treatment instructions: Expect mild to moderate muscle soreness. S/S of pneumothorax if dry needled over a lung field, and to seek immediate medical attention should they occur. Patient verbalized understanding of these instructions and education.  Patient Consent Given: Yes Education handout provided: Previously provided Muscles treated: bil anterior upper trap approach,  left SCM, bil Upper trap posterior approach, bil cervical multifidi; suboccipitals Electrical stimulation performed: No Parameters: N/A Treatment response/outcome: dec tender point size/number, twitch response and decreased muscle tension  Heat 10 min       PATIENT EDUCATION:  Education details: initiated HEP Person educated: Patient Education method: Programmer, multimedia, Facilities manager, Verbal cues, and Handouts Education comprehension: verbalized understanding, returned demonstration, and verbal cues required  HOME EXERCISE PROGRAM: Access Code: [email or web address] URL: [email or web address] Date: 05/14/2023 Prepared by: Lavinia Sharps  Exercises - Supine Chest Stretch on Foam Roll  - 1 x daily - 7 x weekly - 3 sets - 10 reps - Thoracic Extension Mobilization on Foam Roll  - 1 x daily - 7 x weekly - 3 sets - 10 reps - Supine Shoulder Horizontal Abduction with Resistance  - 1 x daily - 7 x weekly - 3 sets - 10 reps - Supine PNF D2 Flexion with Resistance  - 1 x daily -  7 x weekly - 3 sets - 10 reps - Latissimus Dorsi Stretch at Wall  - 2 x daily - 7 x weekly - 1 sets - 2-3 reps - 20 hold - Prone Chest Stretch on Chair  - 1 x daily - 7 x weekly - 1 sets - 2-3 reps - 20 hold - Standing Thoracic Open Book at Wall  - 1 x daily - 7 x weekly - 1 sets - 5 reps - Standing Cervical Retraction  - 3-4 x daily - 7 x weekly - 1 sets - 5 reps - Wall Push Up  - 1 x daily - 7 x weekly - 1 sets - 10 reps - Wall Angels  - 1 x daily - 7 x weekly - 1 sets - 10 reps - Shoulder Overhead Press in Flexion with Dumbbells  - 1 x daily - 7 x weekly - 2 sets - 5 reps - Squat with Chair Touch  - 1 x daily - 7 x weekly - 2 sets - 5 reps - Half Deadlift with Kettlebell  - 1 x daily - 7 x weekly - 2 sets - 5 reps - Farmer's Carry with Kettlebells  - 1 x daily - 7 x weekly - 1 sets - 10 reps - Standing Hip Circles  - 1 x daily - 7 x weekly - 1 sets - 10 reps - Seated Assisted Cervical Rotation with Towel  - 1 x daily - 7  x weekly - 1 sets - 10 reps - Cervical Extension AROM with Strap  - 1 x daily - 7 x weekly - 1 sets - 10 reps - Supine Diaphragmatic Breathing  - 1 x daily - 7 x weekly - 1 sets - 10 reps - Doorway Pec Stretch at 90 Degrees Abduction  - 1 x daily - 7 x weekly - 2 sets - 2 reps - 20 hold - Doorway Pec Stretch at 120 Degrees Abduction  - 1 x daily - 7 x weekly - 2 sets - 2 reps - 20 hold - Standing Quadratus Lumborum Stretch with Doorway  - 1 x daily - 7 x weekly - 2 sets - 2 reps - 20 hold - Standing Lat Pull Down with Resistance - Elbows Bent  - 1 x daily - 7 x weekly - 1 sets - 10 reps - Shoulder extension with resistance - Neutral  - 1 x daily - 7 x weekly - 1 sets - 10 reps - Standing Plank on Wall  - 1 x daily - 7 x weekly - 1 sets - 5 reps - 5 hold - Standing Full Side Plank on Wall  - 1 x daily - 7 x weekly - 1 sets - 3 reps - 5 hold -ASSESSMENT:  CLINICAL IMPRESSION: The patient has been able to return to work (3 weeks ago).  She reports decreased intensity of pain from severe to moderate with self rated improvement since start of care at 30-40%.  Neck Disability Index has improved from 80% limitation to 54%.  She is limited in stamina to endure job tasks for long shifts but also with driving her kids to and from school and doing housework for extended periods of time. The patient would benefit from a continuation of skilled PT for a further progression of strengthening and functional mobility.  Will continue to update and promote independence in a HEP needed for a return to the highest functional level possible with ADLs.  Recommend decrease in treatment frequency to  1x/week.   OBJECTIVE IMPAIRMENTS: increased fascial restrictions and increased muscle spasms.   ACTIVITY LIMITATIONS: carrying, lifting, bending, sitting, squatting, sleeping, transfers, bed mobility, bathing, dressing, and hygiene/grooming  PARTICIPATION LIMITATIONS: meal prep, cleaning, laundry, interpersonal relationship,  driving, shopping, community activity, and occupation  PERSONAL FACTORS: Education, Past/current experiences, and 3+ comorbidities: anxiety , depression and chronic pain  are also affecting patient's functional outcome.   REHAB POTENTIAL: Fair due to   CLINICAL DECISION MAKING: Unstable/unpredictable  EVALUATION COMPLEXITY: High   GOALS: Goals reviewed with patient? Yes  SHORT TERM GOALS: Target date: 04/24/2023   Patient to report pain no greater than 2/10  Baseline:  Goal status: ongoing  2.  Patient will be independent with initial HEP  Baseline:  Goal status: met 9/17    LONG TERM GOALS: Target date:07/30/2023   Patient to report an overall improvement at 60% with home and work ADLs Baseline:  Goal status: revised  2.  Patient to be independent with advanced HEP  Baseline:  Goal status:ongoing  3.  Patient to be able to sleep through the night  Baseline:  Goal status: ongoing 4.  Patient to be able to return to work Baseline:  Goal status: met 10/15  5.  Neck pain disablity score to improve by 5 Baseline:  Goal status: met 10/15  6.  Patient to report PSFS score of 6 with doing housework for extended period of time Baseline:  Goal status: INITIAL 7.  Patient will report PSFS of 8 with driving kids to and from school New  8. PSFS of 6 with stamina to work a long shift new   PLAN:  PT FREQUENCY: 1x/week  PT DURATION: 10 weeks  PLANNED INTERVENTIONS: Therapeutic exercises, Therapeutic activity, Neuromuscular re-education, Balance training, Gait training, Patient/Family education, Self Care, Joint mobilization, Vestibular training, Canalith repositioning, Aquatic Therapy, Dry Needling, Electrical stimulation, Spinal mobilization, Cryotherapy, Moist heat, Taping, Vasopneumatic device, Traction, Ultrasound, Ionotophoresis 4mg /ml Dexamethasone, and Manual therapy  PLAN FOR NEXT SESSION:  steering wheels side/lateral wall planks;  DN;  UBE, review HEP,  progress postural strengthening including resistance training upper quarter  Lavinia Sharps, PT 05/21/23 9:17 PM Phone: [phone] Fax: [phone]

## 2023-05-22 ENCOUNTER — Encounter: Payer: Self-pay | Admitting: Physical Medicine and Rehabilitation

## 2023-05-22 ENCOUNTER — Encounter
Payer: Managed Care, Other (non HMO) | Attending: Physical Medicine and Rehabilitation | Admitting: Physical Medicine and Rehabilitation

## 2023-05-22 VITALS — BP 111/77 | HR 103 | Ht 63.0 in | Wt 131.0 lb

## 2023-05-22 DIAGNOSIS — M542 Cervicalgia: Secondary | ICD-10-CM | POA: Diagnosis present

## 2023-05-22 DIAGNOSIS — M797 Fibromyalgia: Secondary | ICD-10-CM

## 2023-05-22 DIAGNOSIS — Z72820 Sleep deprivation: Secondary | ICD-10-CM | POA: Diagnosis not present

## 2023-05-22 DIAGNOSIS — M7918 Myalgia, other site: Secondary | ICD-10-CM | POA: Insufficient documentation

## 2023-05-22 DIAGNOSIS — F419 Anxiety disorder, unspecified: Secondary | ICD-10-CM

## 2023-05-22 DIAGNOSIS — G8929 Other chronic pain: Secondary | ICD-10-CM

## 2023-05-22 DIAGNOSIS — F32A Depression, unspecified: Secondary | ICD-10-CM | POA: Diagnosis present

## 2023-05-22 MED ORDER — DULOXETINE HCL 60 MG PO CPEP: 60.0000 mg | ORAL_CAPSULE | Freq: Two times a day (BID) | ORAL | 3 refills | Status: DC

## 2023-05-22 MED ORDER — BACLOFEN 10 MG PO TABS: 5.0000 mg | ORAL_TABLET | Freq: Three times a day (TID) | ORAL | 5 refills | Status: DC | PRN

## 2023-05-22 NOTE — Patient Instructions (Signed)
  Fibromyalgia I will message you when I have sent your script for low dose naltrexone. You will be on a low dose at bedtime for 1 week, then if no side effects will increase the dose. Message me after 2 weeks on the medication to report effects. Primary side effect I would watch for is worsening anxiety.   If you do not toerate this medicaiton, we may do a short course of tramadol #15 tabs for severe pain only.   I have refilled your duloxetine 60 mg BID and your baclofen 5-10 mg three times daily as needed.  Continue staying active, meditating and attenting PT as discussed; you are doing awesome here! Stay aware of your activity level and try not to overdo it if possible.    I am writing yo a work Naval architect note for 2x 15 minute breaks in a 11 hour shift to help reduce pain from overactivity.   Follow up in 1 month  Myofascial pain Neck pain, chronic Does best with dry needling with PT rather than trigger points; continue this  Today, I provided you a handout on dietary interventions for pain and anxiety emphasizing low inflammatory diet  Anxiety and depression Seeing psychiatrist regularly, some external stressors are improving and I anticipate this will help with pain as well.   Poor sleep  For sleep, I want you to pick a time to lay down every night, ideally between 8 and 10 PM.    Starting 1 hour before you want to go to sleep, turn off all television screens, phone screens, tablets, and computers.    Keep the lights low and perform only low stimulation activities, such as reading.    Only use your bedroom for sleep and sex.    You may also take 3 to 5 mg of over-the-counter melatonin approximately 1 hour before bedtime or your prescribed ambien.

## 2023-05-23 ENCOUNTER — Other Ambulatory Visit: Payer: Self-pay | Admitting: Physical Medicine and Rehabilitation

## 2023-05-24 ENCOUNTER — Telehealth: Payer: Self-pay | Admitting: Physical Medicine and Rehabilitation

## 2023-05-24 NOTE — Telephone Encounter (Signed)
Patient had received a letter for employer by Dr Shearon Stalls but employer needs a timeframe or needs to know if its permanent . And her boss would like note updated by the end of the day today , informed Dr Marina Gravel not in the office today . Patient needs this accomodation for her during her breaks .

## 2023-05-27 ENCOUNTER — Ambulatory Visit: Payer: Managed Care, Other (non HMO) | Admitting: Physical Therapy

## 2023-05-28 ENCOUNTER — Ambulatory Visit: Payer: Managed Care, Other (non HMO) | Admitting: Physical Therapy

## 2023-05-28 DIAGNOSIS — M542 Cervicalgia: Secondary | ICD-10-CM

## 2023-05-28 DIAGNOSIS — M6281 Muscle weakness (generalized): Secondary | ICD-10-CM

## 2023-05-28 DIAGNOSIS — N393 Stress incontinence (female) (male): Secondary | ICD-10-CM | POA: Diagnosis not present

## 2023-05-28 DIAGNOSIS — M62838 Other muscle spasm: Secondary | ICD-10-CM

## 2023-05-28 NOTE — Therapy (Signed)
OUTPATIENT PHYSICAL THERAPY CERVICAL TREATMENT NOTE   Patient Name: Adrienne Williams MRN: [id number] DOB:Oct 23, 1980, 42 y.o., female Today's Date: 05/28/2023  END OF SESSION:  PT End of Session - 05/28/23 0800     Visit Number 15    Date for PT Re-Evaluation 07/30/23    Authorization Type [employer]    PT Start Time 0801    PT Stop Time 0845    PT Time Calculation (min) 44 min    Activity Tolerance Patient tolerated treatment well              Past Medical History:  Diagnosis Date   Anxiety    Asthma    Depression    Family history of breast cancer    Homero Fellers breech presentation 05/29/2018   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   Hemorrhoids    Hx of varicella    PONV (postoperative nausea and vomiting)    "little nauseated"   Postpartum care following cesarean delivery (6/14) 01/18/2016   Past Surgical History:  Procedure Laterality Date   BREAST BIOPSY Right 02/25/2023   CESAREAN SECTION N/A 01/18/2016   Procedure: Primary CESAREAN SECTION;  Surgeon: Genia Del, MD;  Location: [facility];  Service: Obstetrics;  Laterality: N/A;  EDD: 01/25/16   CESAREAN SECTION N/A 05/29/2018   Procedure: Repeat CESAREAN SECTION;  Surgeon: Shea Evans, MD;  Location: [facility];  Service: Obstetrics;  Laterality: N/A;  EDD: 06/10/18   COLONOSCOPY     CYSTECTOMY  2002   pilonidal region   DIAGNOSTIC LAPAROSCOPY WITH REMOVAL OF ECTOPIC PREGNANCY Right 05/29/2020   Procedure: DIAGNOSTIC LAPAROSCOPY WITH REMOVAL OF ECTOPIC PREGNANCY;  Surgeon: Shea Evans, MD;  Location: [facility] OR;  Service: Gynecology;  Laterality: Right;   MOLE REMOVAL     UNILATERAL SALPINGECTOMY Right 05/29/2020   Procedure: UNILATERAL SALPINGECTOMY;  Surgeon: Shea Evans, MD;  Location: [facility] OR;  Service: Gynecology;  Laterality: Right;   WISDOM TOOTH EXTRACTION     Patient Active Problem List   Diagnosis Date Noted   Poor sleep 05/22/2023   Neck pain, chronic  06/27/2022   Fibromyalgia 06/27/2022   Myofascial pain 06/27/2022   Internal hemorrhoids 06/13/2022   Rectal bleeding 06/13/2022   Family history of breast cancer 01/18/2022   Monoallelic mutation of CHEK2 gene in female patient 01/18/2022   Anxiety and depression 05/31/2018   Rh negative, maternal 05/31/2018   Homero Fellers breech presentation 05/29/2018   Status post repeat low transverse cesarean section 05/29/2018   Postpartum care following cesarean delivery (10/24) 05/29/2018    PCP: Jarrett Soho, PA-C   REFERRING PROVIDER: Jackelyn Poling, DO  REFERRING DIAG: [phone] (ICD-10-CM) - Other muscle spasm  THERAPY DIAG:  Muscle weakness (generalized)  Other muscle spasm  Cervicalgia  Rationale for Evaluation and Treatment: Rehabilitation  ONSET DATE: 03/26/2023  SUBJECTIVE:  SUBJECTIVE STATEMENT: Opening all those prescription bottles at work bothers my left upper trap and shoulder blade area.  Hard to turn my head to the right.  I have more awareness of what work does to me.  My pain is moderate at most which is awesome and even some times of no pain!  No big pain spikes.  Going horseback riding today.    EVAL: Patient reports she was helping her husband install garage seal on 03-10-23.  She felt severe pain in thoracic spine and neck onset around 03-13-23 and explains that she noticed that she had a lot of swelling and had to get a bigger bra and extender for her bra due to the swelling.  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  Difficulty with driving, sleeping, IADL's self care and working.  I have two kids and caring for them and the dogs has been very challenging.  I am usually very active but unable to do anything right now.  Hand dominance:  Right  PERTINENT HISTORY:  na  PAIN:  PAIN:  Are you having pain? Yes NPRS scale:  4/10 Pain location: my lower body bothers me more than neck pain  Aggravating factors: stress; overstretching Relieving factors: DN   PRECAUTIONS: None  RED FLAGS: None     WEIGHT BEARING RESTRICTIONS: No  FALLS:  Has patient fallen in last 6 months? No  LIVING ENVIRONMENT: Lives with: lives with their family and lives with their spouse Lives in: House/apartment   OCCUPATION: Pharmacist  PLOF: Independent, Independent with basic ADLs, Independent with household mobility without device, Independent with community mobility without device, Independent with homemaking with ambulation, Independent with gait, and Independent with transfers  PATIENT GOALS: to eliminate pain and be able to work and do my usual daily activities and sleep without interruption The Patient-Specific Functional Scale  Initial:  I am going to ask you to identify up to 3 important activities that you are unable to do or are having difficulty with as a result of this problem.  Today are there any activities that you are unable to do or having difficulty with because of this?  (Patient shown scale and patient rated each activity)  Follow up: When you first came in you had difficulty performing these activities.  Today do you still have difficulty?  Patient-Specific activity scoring scheme (Point to one number):  0 1 2 3 4 5 6 7 8 9  10 Unable                                                                                                          Able to perform To perform  activity at the same Activity         Level as before                                                                                                                       Injury or problem  Activity       Stamina to work all day                                                                        10/15     5                   2.             Driving the kids to school and picking up                                    10/15            6                      3.            Doing extended housework                                                            10/15                4   4. Able to push the cart at [employer]    NEXT MD VISIT: 04/10/23  OBJECTIVE:   DIAGNOSTIC FINDINGS:  MRI showed small lesion 4 mm breast and had biopsy in July; normal but recommended another MRI and biopsy   PATIENT SURVEYS:  NDI 80% self disability score  NDI 10/15:  54%  COGNITION: Overall cognitive status: Within functional limits for tasks assessed  SENSATION: WFL  POSTURE: rounded shoulders  PALPATION: Tight bands and trigger points in R > L upper traps and rhomboids   CERVICAL ROM:   Active ROM A/PROM (deg) eval 10/15  Flexion WNL WNL  Extension WNL WNL  Right lateral flexion WNL WNL  Left lateral flexion WNL WNL  Right rotation WNL WNL  Left rotation WNL WNL   (Blank rows = not tested)   UPPER EXTREMITY MMT: 5/5 throughout bilateral upper extremities but patient states that she "is going to be so sore" from the muscle testing throughout the test.   TODAY'S TREATMENT:  10/22 Nu-Step L3  ( blue machine) 5 min Standing cable row 15# 8x Wall lift offs with green loop 4x  (very challenging) Squats with 10# KB 10x Farmer's carry hold with BOSU alternating step taps 10x right/left sides Standing 30# lat pulls 12x   Manual therapy to bil upper traps cervical musculature Trigger Point Dry-Needling  Treatment instructions: Expect mild to moderate muscle soreness. S/S of pneumothorax if dry needled over a lung field, and to seek immediate medical attention should they occur. Patient verbalized understanding of these instructions and education.  Patient Consent Given: Yes Education handout provided: Previously provided Muscles treated: bil  teres, bil lats; bil upper trap (ant and post approach); bil pecs Electrical stimulation performed: No Parameters: N/A Treatment response/outcome: dec tender point size/number, twitch response and decreased muscle tension  Heat 2 min     10/15 Nu-Step L5  ( green machine) 5 min NDI PSFS Standing cable row 10# 10x Wall clocks with green loop 7x right/left Standing red band lat pulls 12x   Green band loop steering wheels 10x  Manual therapy to bil upper traps cervical musculature Trigger Point Dry-Needling  Treatment instructions: Expect mild to moderate muscle soreness. S/S of pneumothorax if dry needled over a lung field, and to seek immediate medical attention should they occur. Patient verbalized understanding of these instructions and education.  Patient Consent Given: Yes Education handout provided: Previously provided Muscles treated: bil teres, lats; bil upper trap  Electrical stimulation performed: No Parameters: N/A Treatment response/outcome: dec tender point size/number, twitch response and decreased muscle tension  Heat 2 min     10/11 Seated thoracic extension with ball 8x Seated holding purple Pilates ball lateral stretch 8x each way Seated open books holding Pilates ball 8x each way Cervical SNAG with towel for rotation 8x right/left Seated 4# yellow plyoball: Vs, hip to hip,  small chops each way 8x each  Nu-Step L5  ( green machine) 5 min  Standing cable row 10# 10x Seated red band triceps extension 10x right/left Standing red band lat pulls 12x       PATIENT EDUCATION:  Education details: initiated HEP Person educated: Patient Education method: Programmer, multimedia, Facilities manager, Verbal cues, and Handouts Education comprehension: verbalized understanding, returned demonstration, and verbal cues required  HOME EXERCISE PROGRAM: Access Code: [email or web address] URL: [email or web address] Date: 05/14/2023 Prepared by: Lavinia Sharps  Exercises - Supine Chest  Stretch on Foam Roll  - 1 x daily - 7 x weekly - 3 sets - 10 reps - Thoracic Extension Mobilization on Foam Roll  - 1 x daily - 7 x weekly - 3 sets - 10 reps - Supine Shoulder Horizontal Abduction with Resistance  - 1 x daily - 7 x weekly - 3 sets - 10 reps - Supine PNF D2 Flexion with Resistance  - 1 x daily - 7 x weekly - 3 sets - 10 reps - Latissimus Dorsi Stretch at Wall  - 2 x daily - 7 x weekly - 1 sets - 2-3 reps - 20 hold - Prone Chest Stretch on Chair  - 1 x daily - 7 x weekly - 1 sets - 2-3 reps - 20 hold - Standing Thoracic Open Book at Wall  - 1 x daily - 7 x weekly - 1 sets - 5 reps - Standing Cervical Retraction  - 3-4 x daily - 7 x weekly - 1 sets - 5 reps - Wall Push Up  - 1 x daily - 7 x weekly - 1 sets - 10  reps - Wall Angels  - 1 x daily - 7 x weekly - 1 sets - 10 reps - Shoulder Overhead Press in Flexion with Dumbbells  - 1 x daily - 7 x weekly - 2 sets - 5 reps - Squat with Chair Touch  - 1 x daily - 7 x weekly - 2 sets - 5 reps - Half Deadlift with Kettlebell  - 1 x daily - 7 x weekly - 2 sets - 5 reps - Farmer's Carry with Kettlebells  - 1 x daily - 7 x weekly - 1 sets - 10 reps - Standing Hip Circles  - 1 x daily - 7 x weekly - 1 sets - 10 reps - Seated Assisted Cervical Rotation with Towel  - 1 x daily - 7 x weekly - 1 sets - 10 reps - Cervical Extension AROM with Strap  - 1 x daily - 7 x weekly - 1 sets - 10 reps - Supine Diaphragmatic Breathing  - 1 x daily - 7 x weekly - 1 sets - 10 reps - Doorway Pec Stretch at 90 Degrees Abduction  - 1 x daily - 7 x weekly - 2 sets - 2 reps - 20 hold - Doorway Pec Stretch at 120 Degrees Abduction  - 1 x daily - 7 x weekly - 2 sets - 2 reps - 20 hold - Standing Quadratus Lumborum Stretch with Doorway  - 1 x daily - 7 x weekly - 2 sets - 2 reps - 20 hold - Standing Lat Pull Down with Resistance - Elbows Bent  - 1 x daily - 7 x weekly - 1 sets - 10 reps - Shoulder extension with resistance - Neutral  - 1 x daily - 7 x weekly - 1 sets  - 10 reps - Standing Plank on Wall  - 1 x daily - 7 x weekly - 1 sets - 5 reps - 5 hold - Standing Full Side Plank on Wall  - 1 x daily - 7 x weekly - 1 sets - 3 reps - 5 hold -ASSESSMENT:  CLINICAL IMPRESSION: Able to increase intensity of exercise with several exercises today with a positive response.  Verbal and tactile cues to activate middle and lower traps with wall lift offs (very challenging). The patient had numerous muscle twitches produced during dry needling particularly teres and subscapularis muscles which is a good prognostic indicator for benefit as it is associated with positive neuromotor and nervous system changes.  Much improved muscle length and decreased tender points following session.    OBJECTIVE IMPAIRMENTS: increased fascial restrictions and increased muscle spasms.   ACTIVITY LIMITATIONS: carrying, lifting, bending, sitting, squatting, sleeping, transfers, bed mobility, bathing, dressing, and hygiene/grooming  PARTICIPATION LIMITATIONS: meal prep, cleaning, laundry, interpersonal relationship, driving, shopping, community activity, and occupation  PERSONAL FACTORS: Education, Past/current experiences, and 3+ comorbidities: anxiety , depression and chronic pain  are also affecting patient's functional outcome.   REHAB POTENTIAL: Fair due to   CLINICAL DECISION MAKING: Unstable/unpredictable  EVALUATION COMPLEXITY: High   GOALS: Goals reviewed with patient? Yes  SHORT TERM GOALS: Target date: 04/24/2023   Patient to report pain no greater than 2/10  Baseline:  Goal status: ongoing  2.  Patient will be independent with initial HEP  Baseline:  Goal status: met 9/17    LONG TERM GOALS: Target date:07/30/2023   Patient to report an overall improvement at 60% with home and work ADLs Baseline:  Goal status: revised  2.  Patient to  be independent with advanced HEP  Baseline:  Goal status:ongoing  3.  Patient to be able to sleep through the night   Baseline:  Goal status: ongoing 4.  Patient to be able to return to work Baseline:  Goal status: met 10/15  5.  Neck pain disablity score to improve by 5 Baseline:  Goal status: met 10/15  6.  Patient to report PSFS score of 6 with doing housework for extended period of time Baseline:  Goal status: INITIAL 7.  Patient will report PSFS of 8 with driving kids to and from school New  8. PSFS of 6 with stamina to work a long shift new   PLAN:  PT FREQUENCY: 1x/week  PT DURATION: 10 weeks  PLANNED INTERVENTIONS: Therapeutic exercises, Therapeutic activity, Neuromuscular re-education, Balance training, Gait training, Patient/Family education, Self Care, Joint mobilization, Vestibular training, Canalith repositioning, Aquatic Therapy, Dry Needling, Electrical stimulation, Spinal mobilization, Cryotherapy, Moist heat, Taping, Vasopneumatic device, Traction, Ultrasound, Ionotophoresis 4mg /ml Dexamethasone, and Manual therapy  PLAN FOR NEXT SESSION:  steering wheels side/lateral wall planks; push sled;  DN;  Nu-step, review HEP, progress postural strengthening including resistance training upper quarter  Lavinia Sharps, PT 05/28/23 8:53 AM Phone: [phone] Fax: [phone]

## 2023-05-29 ENCOUNTER — Encounter: Payer: Self-pay | Admitting: Physical Medicine and Rehabilitation

## 2023-05-29 MED ORDER — NALTREXONE HCL (PAIN) 1.5 MG PO CAPS: ORAL_CAPSULE | ORAL | 0 refills | Status: DC

## 2023-06-04 ENCOUNTER — Ambulatory Visit: Payer: Managed Care, Other (non HMO) | Admitting: Physical Therapy

## 2023-06-04 DIAGNOSIS — M62838 Other muscle spasm: Secondary | ICD-10-CM

## 2023-06-04 DIAGNOSIS — M542 Cervicalgia: Secondary | ICD-10-CM

## 2023-06-04 DIAGNOSIS — M6281 Muscle weakness (generalized): Secondary | ICD-10-CM

## 2023-06-04 DIAGNOSIS — M546 Pain in thoracic spine: Secondary | ICD-10-CM | POA: Diagnosis not present

## 2023-06-04 NOTE — Therapy (Signed)
OUTPATIENT PHYSICAL THERAPY CERVICAL TREATMENT NOTE   Patient Name: Adrienne Williams MRN: [id number] DOB:[birth year], 42 y.o., female Today's Date: 06/04/2023  END OF SESSION:  PT End of Session - 06/04/23 0848     Visit Number 16    Date for PT Re-Evaluation 07/30/23    Authorization Type [employer]    PT Start Time [phone]    PT Stop Time 0940   pt stayed beyond session to enjoy heat   PT Time Calculation (min) 53 min    Activity Tolerance Patient tolerated treatment well              Past Medical History:  Diagnosis Date   Anxiety    Asthma    Depression    Family history of breast cancer    Homero Fellers breech presentation 05/29/2018   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   Hemorrhoids    Hx of varicella    PONV (postoperative nausea and vomiting)    "little nauseated"   Postpartum care following cesarean delivery (6/14) 01/18/2016   Past Surgical History:  Procedure Laterality Date   BREAST BIOPSY Right 02/25/2023   CESAREAN SECTION N/A 01/18/2016   Procedure: Primary CESAREAN SECTION;  Surgeon: Genia Del, MD;  Location: [facility];  Service: Obstetrics;  Laterality: N/A;  EDD: 01/25/16   CESAREAN SECTION N/A 05/29/2018   Procedure: Repeat CESAREAN SECTION;  Surgeon: Shea Evans, MD;  Location: [facility];  Service: Obstetrics;  Laterality: N/A;  EDD: 06/10/18   COLONOSCOPY     CYSTECTOMY  2002   pilonidal region   DIAGNOSTIC LAPAROSCOPY WITH REMOVAL OF ECTOPIC PREGNANCY Right 05/29/2020   Procedure: DIAGNOSTIC LAPAROSCOPY WITH REMOVAL OF ECTOPIC PREGNANCY;  Surgeon: Shea Evans, MD;  Location: [facility] OR;  Service: Gynecology;  Laterality: Right;   MOLE REMOVAL     UNILATERAL SALPINGECTOMY Right 05/29/2020   Procedure: UNILATERAL SALPINGECTOMY;  Surgeon: Shea Evans, MD;  Location: [facility] OR;  Service: Gynecology;  Laterality: Right;   WISDOM TOOTH EXTRACTION     Patient Active Problem List   Diagnosis Date Noted   Poor  sleep 05/22/2023   Neck pain, chronic 06/27/2022   Fibromyalgia 06/27/2022   Myofascial pain 06/27/2022   Internal hemorrhoids 06/13/2022   Rectal bleeding 06/13/2022   Family history of breast cancer 01/18/2022   Monoallelic mutation of CHEK2 gene in female patient 01/18/2022   Anxiety and depression 05/31/2018   Rh negative, maternal 05/31/2018   Homero Fellers breech presentation 05/29/2018   Status post repeat low transverse cesarean section 05/29/2018   Postpartum care following cesarean delivery (10/24) 05/29/2018    PCP: Jarrett Soho, PA-C   REFERRING PROVIDER: Jackelyn Poling, DO  REFERRING DIAG: [id number] (ICD-10-CM) - Other muscle spasm  THERAPY DIAG:  Muscle weakness (generalized)  Other muscle spasm  Cervicalgia  Rationale for Evaluation and Treatment: Rehabilitation  ONSET DATE: 03/26/2023  SUBJECTIVE:  SUBJECTIVE STATEMENT: I feel tight in my intercostals, especially right ribs.  It gets stuck b/c I clamp down from the pain.   EVAL: Patient reports she was helping her husband install garage seal on 03-10-23.  She felt severe pain in thoracic spine and neck onset around 03-13-23 and explains that she noticed that she had a lot of swelling and had to get a bigger bra and extender for her bra due to the swelling.  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  Difficulty with driving, sleeping, IADL's self care and working.  I have two kids and caring for them and the dogs has been very challenging.  I am usually very active but unable to do anything right now.  Hand dominance: Right  PERTINENT HISTORY:  na  PAIN:  PAIN:  Are you having pain? Yes NPRS scale:  4-5/10 Pain location: my ribs feel stuck  Aggravating factors: stress;  overstretching Relieving factors: DN   PRECAUTIONS: None  RED FLAGS: None     WEIGHT BEARING RESTRICTIONS: No  FALLS:  Has patient fallen in last 6 months? No  LIVING ENVIRONMENT: Lives with: lives with their family and lives with their spouse Lives in: House/apartment   OCCUPATION: Pharmacist  PLOF: Independent, Independent with basic ADLs, Independent with household mobility without device, Independent with community mobility without device, Independent with homemaking with ambulation, Independent with gait, and Independent with transfers  PATIENT GOALS: to eliminate pain and be able to work and do my usual daily activities and sleep without interruption The Patient-Specific Functional Scale  Initial:  I am going to ask you to identify up to 3 important activities that you are unable to do or are having difficulty with as a result of this problem.  Today are there any activities that you are unable to do or having difficulty with because of this?  (Patient shown scale and patient rated each activity)  Follow up: When you first came in you had difficulty performing these activities.  Today do you still have difficulty?  Patient-Specific activity scoring scheme (Point to one number):  0 1 2 3 4 5 6 7 8 9  10 Unable                                                                                                          Able to perform To perform                                                                                                    activity at the same Activity  Level as before                                                                                                                       Injury or problem  Activity       Stamina to work all day                                                                       10/15     5                   2.             Driving the kids to school and picking up                                    10/15            6                       3.            Doing extended housework                                                            10/15                4   4. Able to push the cart at [employer]    NEXT MD VISIT: 04/10/23  OBJECTIVE:   DIAGNOSTIC FINDINGS:  MRI showed small lesion 4 mm breast and had biopsy in July; normal but recommended another MRI and biopsy   PATIENT SURVEYS:  NDI 80% self disability score  NDI 10/15:  54%  COGNITION: Overall cognitive status: Within functional limits for tasks assessed  SENSATION: WFL  POSTURE: rounded shoulders  PALPATION: Tight bands and trigger points in R > L upper traps and rhomboids   CERVICAL ROM:   Active ROM A/PROM (deg) eval 10/15  Flexion WNL WNL  Extension WNL WNL  Right lateral flexion WNL WNL  Left lateral flexion WNL WNL  Right rotation WNL WNL  Left rotation WNL WNL   (Blank rows = not tested)   UPPER EXTREMITY MMT: 5/5 throughout bilateral upper extremities but patient states that she "is going to be so sore" from the muscle testing throughout the test.   TODAY'S TREATMENT:   10/29 Nu-Step L3  ( blue machine) 5 min Seated peanut ball to  lengthen trunk musculature 5x right/left Sidelying pelvic oscillations using peanut ball Open books with peanut ball 10x  Recorded pt doing the peanut ball ex's with her phone since they are not on Medbridge (pt interested in getting a peanut for home) Standing cable row 15# 10x Standing 30# lat pulls 15x   Manual therapy to bil upper traps, scapular musculature Trigger Point Dry-Needling  Treatment instructions: Expect mild to moderate muscle soreness. S/S of pneumothorax if dry needled over a lung field, and to seek immediate medical attention should they occur. Patient verbalized understanding of these instructions and education.  Patient Consent Given: Yes Education handout provided: Previously provided Muscles treated: bil teres, bil lats; bil upper traps and levator scap  muscles Electrical stimulation performed: No Parameters: N/A Treatment response/outcome: dec tender point size/number, twitch response and decreased muscle tension  Heat 10 min     10/22 Nu-Step L3  ( blue machine) 5 min Standing cable row 15# 8x Wall lift offs with green loop 4x  (very challenging) Squats with 10# KB 10x Farmer's carry hold with BOSU alternating step taps 10x right/left sides Standing 30# lat pulls 12x   Manual therapy to bil upper traps cervical musculature Trigger Point Dry-Needling  Treatment instructions: Expect mild to moderate muscle soreness. S/S of pneumothorax if dry needled over a lung field, and to seek immediate medical attention should they occur. Patient verbalized understanding of these instructions and education.  Patient Consent Given: Yes Education handout provided: Previously provided Muscles treated: bil teres, bil lats; bil upper trap (ant and post approach); bil pecs Electrical stimulation performed: No Parameters: N/A Treatment response/outcome: dec tender point size/number, twitch response and decreased muscle tension  Heat 2 min     10/15 Nu-Step L5  ( green machine) 5 min NDI PSFS Standing cable row 10# 10x Wall clocks with green loop 7x right/left Standing red band lat pulls 12x   Green band loop steering wheels 10x  Manual therapy to bil upper traps cervical musculature Trigger Point Dry-Needling  Treatment instructions: Expect mild to moderate muscle soreness. S/S of pneumothorax if dry needled over a lung field, and to seek immediate medical attention should they occur. Patient verbalized understanding of these instructions and education.  Patient Consent Given: Yes Education handout provided: Previously provided Muscles treated: bil teres, lats; bil upper trap  Electrical stimulation performed: No Parameters: N/A Treatment response/outcome: dec tender point size/number, twitch response and decreased muscle tension  Heat 2 min          PATIENT EDUCATION:  Education details:  HEP Person educated: Patient Education method: Programmer, multimedia, Facilities manager, Verbal cues, and Handouts Education comprehension: verbalized understanding, returned demonstration, and verbal cues required  HOME EXERCISE PROGRAM: Access Code: [email or web address] URL: [email or web address] Date: 05/14/2023 Prepared by: Lavinia Sharps  Exercises - Supine Chest Stretch on Foam Roll  - 1 x daily - 7 x weekly - 3 sets - 10 reps - Thoracic Extension Mobilization on Foam Roll  - 1 x daily - 7 x weekly - 3 sets - 10 reps - Supine Shoulder Horizontal Abduction with Resistance  - 1 x daily - 7 x weekly - 3 sets - 10 reps - Supine PNF D2 Flexion with Resistance  - 1 x daily - 7 x weekly - 3 sets - 10 reps - Latissimus Dorsi Stretch at Wall  - 2 x daily - 7 x weekly - 1 sets - 2-3 reps - 20 hold - Prone Chest Stretch on Chair  - 1 x daily -  7 x weekly - 1 sets - 2-3 reps - 20 hold - Standing Thoracic Open Book at Wall  - 1 x daily - 7 x weekly - 1 sets - 5 reps - Standing Cervical Retraction  - 3-4 x daily - 7 x weekly - 1 sets - 5 reps - Wall Push Up  - 1 x daily - 7 x weekly - 1 sets - 10 reps - Wall Angels  - 1 x daily - 7 x weekly - 1 sets - 10 reps - Shoulder Overhead Press in Flexion with Dumbbells  - 1 x daily - 7 x weekly - 2 sets - 5 reps - Squat with Chair Touch  - 1 x daily - 7 x weekly - 2 sets - 5 reps - Half Deadlift with Kettlebell  - 1 x daily - 7 x weekly - 2 sets - 5 reps - Farmer's Carry with Kettlebells  - 1 x daily - 7 x weekly - 1 sets - 10 reps - Standing Hip Circles  - 1 x daily - 7 x weekly - 1 sets - 10 reps - Seated Assisted Cervical Rotation with Towel  - 1 x daily - 7 x weekly - 1 sets - 10 reps - Cervical Extension AROM with Strap  - 1 x daily - 7 x weekly - 1 sets - 10 reps - Supine Diaphragmatic Breathing  - 1 x daily - 7 x weekly - 1 sets - 10 reps - Doorway Pec Stretch at 90 Degrees Abduction  - 1 x daily - 7 x weekly  - 2 sets - 2 reps - 20 hold - Doorway Pec Stretch at 120 Degrees Abduction  - 1 x daily - 7 x weekly - 2 sets - 2 reps - 20 hold - Standing Quadratus Lumborum Stretch with Doorway  - 1 x daily - 7 x weekly - 2 sets - 2 reps - 20 hold - Standing Lat Pull Down with Resistance - Elbows Bent  - 1 x daily - 7 x weekly - 1 sets - 10 reps - Shoulder extension with resistance - Neutral  - 1 x daily - 7 x weekly - 1 sets - 10 reps - Standing Plank on Wall  - 1 x daily - 7 x weekly - 1 sets - 5 reps - 5 hold - Standing Full Side Plank on Wall  - 1 x daily - 7 x weekly - 1 sets - 3 reps - 5 hold -ASSESSMENT:  CLINICAL IMPRESSION: The patient continues to benefit significantly from dry needling and manual therapy to stimulate underlying myofascial trigger points and muscular tissue for management of neuromusculoskeletal pain and address movement impairments. She responds well to exercise and demonstrates good compliance with HEP.  Prolonged positions and repetitive tasks at work continue to exacerbate symptoms therefore we continue to discuss multiple strategies to counteract this at home and work.  Therapist monitoring response to all interventions and modifying treatment accordingly.    OBJECTIVE IMPAIRMENTS: increased fascial restrictions and increased muscle spasms.   ACTIVITY LIMITATIONS: carrying, lifting, bending, sitting, squatting, sleeping, transfers, bed mobility, bathing, dressing, and hygiene/grooming  PARTICIPATION LIMITATIONS: meal prep, cleaning, laundry, interpersonal relationship, driving, shopping, community activity, and occupation  PERSONAL FACTORS: Education, Past/current experiences, and 3+ comorbidities: anxiety , depression and chronic pain  are also affecting patient's functional outcome.   REHAB POTENTIAL: Fair due to   CLINICAL DECISION MAKING: Unstable/unpredictable  EVALUATION COMPLEXITY: High   GOALS: Goals reviewed with patient? Yes  SHORT TERM GOALS: Target date:  04/24/2023   Patient to report pain no greater than 2/10  Baseline:  Goal status: ongoing  2.  Patient will be independent with initial HEP  Baseline:  Goal status: met 9/17    LONG TERM GOALS: Target date:07/30/2023   Patient to report an overall improvement at 60% with home and work ADLs Baseline:  Goal status: revised  2.  Patient to be independent with advanced HEP  Baseline:  Goal status:ongoing  3.  Patient to be able to sleep through the night  Baseline:  Goal status: ongoing 4.  Patient to be able to return to work Baseline:  Goal status: met 10/15  5.  Neck pain disablity score to improve by 5 Baseline:  Goal status: met 10/15  6.  Patient to report PSFS score of 6 with doing housework for extended period of time Baseline:  Goal status: INITIAL 7.  Patient will report PSFS of 8 with driving kids to and from school New  8. PSFS of 6 with stamina to work a long shift new   PLAN:  PT FREQUENCY: 1x/week  PT DURATION: 10 weeks  PLANNED INTERVENTIONS: Therapeutic exercises, Therapeutic activity, Neuromuscular re-education, Balance training, Gait training, Patient/Family education, Self Care, Joint mobilization, Vestibular training, Canalith repositioning, Aquatic Therapy, Dry Needling, Electrical stimulation, Spinal mobilization, Cryotherapy, Moist heat, Taping, Vasopneumatic device, Traction, Ultrasound, Ionotophoresis 4mg /ml Dexamethasone, and Manual therapy  PLAN FOR NEXT SESSION:  steering wheels side/lateral wall planks; push sled;  DN;  Nu-step, review HEP, progress postural strengthening including resistance training upper quarter  Lavinia Sharps, PT 06/04/23 12:16 PM Phone: [phone] Fax: [phone]

## 2023-06-10 ENCOUNTER — Ambulatory Visit: Payer: Managed Care, Other (non HMO) | Admitting: Physical Medicine and Rehabilitation

## 2023-06-10 ENCOUNTER — Ambulatory Visit: Payer: Managed Care, Other (non HMO) | Attending: Obstetrics and Gynecology | Admitting: Physical Therapy

## 2023-06-10 DIAGNOSIS — M6281 Muscle weakness (generalized): Secondary | ICD-10-CM | POA: Diagnosis present

## 2023-06-10 DIAGNOSIS — M542 Cervicalgia: Secondary | ICD-10-CM | POA: Diagnosis present

## 2023-06-10 DIAGNOSIS — R279 Unspecified lack of coordination: Secondary | ICD-10-CM | POA: Insufficient documentation

## 2023-06-10 DIAGNOSIS — R252 Cramp and spasm: Secondary | ICD-10-CM | POA: Insufficient documentation

## 2023-06-10 DIAGNOSIS — M546 Pain in thoracic spine: Secondary | ICD-10-CM | POA: Insufficient documentation

## 2023-06-10 DIAGNOSIS — M62838 Other muscle spasm: Secondary | ICD-10-CM | POA: Insufficient documentation

## 2023-06-10 NOTE — Therapy (Signed)
OUTPATIENT PHYSICAL THERAPY FEMALE PELVIC EVALUATION   Patient Name: Adrienne Williams MRN: [id number] DOB:02-Feb-1981, 42 y.o., female Today's Date: 06/10/2023  END OF SESSION:  PT End of Session - 06/10/23 0935     Visit Number 17    Date for PT Re-Evaluation 07/30/23    Authorization Type [employer]    PT Start Time 0935    PT Stop Time 1015    PT Time Calculation (min) 40 min    Activity Tolerance Patient tolerated treatment well    Behavior During Therapy Anxious              Past Medical History:  Diagnosis Date   Anxiety    Asthma    Depression    Family history of breast cancer    Frank breech presentation 05/29/2018   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   Hemorrhoids    Hx of varicella    PONV (postoperative nausea and vomiting)    "little nauseated"   Postpartum care following cesarean delivery (6/14) 01/18/2016   Past Surgical History:  Procedure Laterality Date   BREAST BIOPSY Right 02/25/2023   CESAREAN SECTION N/A 01/18/2016   Procedure: Primary CESAREAN SECTION;  Surgeon: Genia Del, MD;  Location: [facility];  Service: Obstetrics;  Laterality: N/A;  EDD: 01/25/16   CESAREAN SECTION N/A 05/29/2018   Procedure: Repeat CESAREAN SECTION;  Surgeon: Shea Evans, MD;  Location: [facility];  Service: Obstetrics;  Laterality: N/A;  EDD: 06/10/18   COLONOSCOPY     CYSTECTOMY  2002   pilonidal region   DIAGNOSTIC LAPAROSCOPY WITH REMOVAL OF ECTOPIC PREGNANCY Right 05/29/2020   Procedure: DIAGNOSTIC LAPAROSCOPY WITH REMOVAL OF ECTOPIC PREGNANCY;  Surgeon: Shea Evans, MD;  Location: [facility] OR;  Service: Gynecology;  Laterality: Right;   MOLE REMOVAL     UNILATERAL SALPINGECTOMY Right 05/29/2020   Procedure: UNILATERAL SALPINGECTOMY;  Surgeon: Shea Evans, MD;  Location: [facility] OR;  Service: Gynecology;  Laterality: Right;   WISDOM TOOTH EXTRACTION     Patient Active Problem List   Diagnosis Date Noted   Poor sleep  05/22/2023   Neck pain, chronic 06/27/2022   Fibromyalgia 06/27/2022   Myofascial pain 06/27/2022   Internal hemorrhoids 06/13/2022   Rectal bleeding 06/13/2022   Family history of breast cancer 01/18/2022   Monoallelic mutation of CHEK2 gene in female patient 01/18/2022   Anxiety and depression 05/31/2018   Rh negative, maternal 05/31/2018   Homero Fellers breech presentation 05/29/2018   Status post repeat low transverse cesarean section 05/29/2018   Postpartum care following cesarean delivery (10/24) 05/29/2018    PCP: Jarrett Soho, PA-C PCP - General  REFERRING PROVIDER: Vick Frees, MD Ref Provider  REFERRING DIAG: N39.3 (ICD-10-CM) - Stress incontinence (female) (female)  THERAPY DIAG:  Muscle weakness (generalized)  Other muscle spasm  Cramp and spasm  Unspecified lack of coordination  Pain in thoracic spine  Cervicalgia  Rationale for Evaluation and Treatment: Rehabilitation  ONSET DATE: 2019  SUBJECTIVE:    Pt comes to therapy crying this morning, slept 3 hours, stated that husband had a breakdown and was not very nice to her. Reports that PT would help. Pt reports that she had an episode of leaking, she went  running. She was upset and went running.  Pain has not been nearly as severe, she has been massaging her scars and it really helped. She had a massage as well.  She reports that she has an old knee injury  and right LOWER EXTREMITY flares up sometimes.     Pt reports that she injured herself, seeing ortho, has had some SUI as well. Reports that she has had severe pain in her upper body, meditation helps. Reports that everything is tight. Privates and hamstrings are tight, Has had a lot of swelling as well - in her pelvis, abdominal as well. Leaks with running.                                                                                                                                                                                      SUBJECTIVE  STATEMENT:  Fluid intake: Yes: mostly water, seltzer, liquid IV    PAIN:  Are you having pain? Yes NPRS scale: 7/10 Pain location: External  Pain type: throbbing, tight, and tingling Pain description: constant, stabbing if super tight  Aggravating factors: walking or running, being on her feet, sitting, standing Relieving factors: meditation, heat, stretching, does not know the right stretches, if she tightens her core  PRECAUTIONS: None  RED FLAGS: None   WEIGHT BEARING RESTRICTIONS: No  FALLS:  Has patient fallen in last 6 months? No  LIVING ENVIRONMENT: Lives with: lives with their family Lives in: House/apartment Stairs: No Has following equipment at home: None  OCCUPATION: pharmacist- retail, very difficult right now  PLOF: Independent  PATIENT GOALS: to be able to relieve pain without having to spend a ton of time on it, has 2 young kids and a job. Pain is a priority. Rare that the leaking happens.   PERTINENT HISTORY:   Sexual abuse: No  BOWEL MOVEMENT: Pain with bowel movement: Yes when stuff is super tight Type of bowel movement:Strain Yes sometimes Fully empty rectum: Yes: when she goes Leakage: No only when the hemorrhoid is really bad, it was crazy, it' better now Pads: No Fiber supplement: No  URINATION: Pain with urination: No Fully empty bladder: No sometimes feels like she struggles Stream: Weak Urgency: No, used to Frequency: not now Leakage: Coughing, Sneezing, and Exercise, running Pads: Yes:    INTERCOURSE: Pain with intercourse: Initial Penetration, During Penetration, After Intercourse, During Climax, and Pain Interrupts Intercourse Ability to have vaginal penetration:  Yes: has not in a while Climax: yes Marinoff Scale: 2/3  PREGNANCY: Vaginal deliveries 0 Tearing No C-section deliveries 2 Currently pregnant No  PROLAPSE: Check with internal   OBJECTIVE:  Note: Objective measures were completed at Evaluation unless  otherwise noted. Very tight and tender abdominal scars Pt irritable Guarded gait Guarded and somewhat anxious     COGNITION: Overall cognitive status: Within functional limits for tasks assessed     SENSATION: Light touch: Appears intact  Proprioception: Appears intact  MUSCLE LENGTH: Hamstrings: tight bilateral Thomas test: Right tight ; Left tight    POSTURE: rounded shoulders and forward head  PELVIC ALIGNMENT:  LUMBARAROM/PROM:  A/PROM A/PROM  eval  Flexion limited  Extension limited  Right lateral flexion limited  Left lateral flexion   Right rotation   Left rotation    (Blank rows = not tested)  LOWER EXTREMITY ROM:  Passive ROM Right eval Left eval  Hip flexion limited limited  Hip extension    Hip abduction limited limited  Hip adduction    Hip internal rotation    Hip external rotation    Knee flexion    Knee extension    Ankle dorsiflexion    Ankle plantarflexion    Ankle inversion    Ankle eversion     (Blank rows = not tested)  LOWER EXTREMITY MMT:  MMT Right eval Left eval  Hip flexion    Hip extension    Hip abduction    Hip adduction    Hip internal rotation    Hip external rotation    Knee flexion    Knee extension    Ankle dorsiflexion    Ankle plantarflexion    Ankle inversion    Ankle eversion     PALPATION:   General  WFL                External Perineal Exam wfl                             Internal Pelvic Floor decreased strength, high tone, tight and tender throughout Patient confirms identification and approves PT to assess internal pelvic floor and treatment Yes  PELVIC MMT:   MMT eval  Vaginal 2/5  Internal Anal Sphincter   External Anal Sphincter   Puborectalis   Diastasis Recti Yes- 2 fingers above umbilicus  (Blank rows = not tested)        TONE: high  PROLAPSE: no TODAY'S TREATMENT:                                                                                                                               DATE:   EVAL see above Manual- internal pelvic floor muscles assessment, perineal massage performed and patient educated on    Neuro reed-    Therapeutic exercises-    Therapeutic activities- education on lubricant, wand, practiced diaphragmatic breathing with thera band reed, reverse kegel on ball, rolled up towel   PATIENT EDUCATION:  Education details/ manual performed: scar massage and cupping Person educated: Patient Education method: Programmer, multimedia, Facilities manager, and Handouts Education comprehension: verbalized understanding, returned demonstration, verbal cues required, tactile cues required, and needs further education  HOME EXERCISE PROGRAM:   [id number]  ASSESSMENT:  CLINICAL IMPRESSION: Pt with high tone pelvic floor vaginally, decreased strength d/t tightness. Tenderness and tightness in superficial pelvic floor muscles. Will benefit from down  training to reduce pain with intercourse and improve SUI with running. Abdominal scars with much less tenderness today. Pt seems to be progressing well.     Patient is a 42 y.o. F who was seen today for physical therapy evaluation and treatment for stress urinary incontinence. At this point her priority is treating her abdominal and pelvic pain, she reported that she has a lot of irritation post intercourse as well and pain and tightness throughout her abdomen, low back and hips. She did well today with treatment and manual therapy on her tight and tender abdominal scars and stretches. Has limited PROM and guarded movement throughout her low back and hips as well as diastasis rectus. Patient will benefit from PT to reduce pain and urinary incontinence and improve QOL.  OBJECTIVE IMPAIRMENTS: decreased activity tolerance, decreased coordination, decreased endurance, decreased mobility, decreased ROM, decreased strength, increased fascial restrictions, increased muscle spasms, impaired flexibility, and pain.   ACTIVITY  LIMITATIONS: bending  PARTICIPATION LIMITATIONS: interpersonal relationship, occupation, and yard work  PERSONAL FACTORS: Time since onset of injury/illness/exacerbation are also affecting patient's functional outcome.   REHAB POTENTIAL: Good  CLINICAL DECISION MAKING: Stable/uncomplicated  EVALUATION COMPLEXITY: Low   GOALS: Goals reviewed with patient? Yes  SHORT TERM GOALS: Target date: 06/10/2023    Pt will be independent with HEP.   Baseline: Goal status: INITIAL  2.  Pt will be able to run/workout for 30 minutes without leakage or discomfort  Baseline:  Goal status: INITIAL  3.  Pt will be independent with diaphragmatic breathing and down training activities in order to improve pelvic floor relaxation.  Baseline:  Goal status: INITIAL   LONG TERM GOALS: Target date: 08/05/2023    Pt will be independent with advanced HEP.   Baseline:  Goal status: INITIAL  2.  Pt will report decreased pelvic pain to max 3/10  Baseline:  Goal status: INITIAL  3.  Pt will report reduced pain abdominal scars to max 3/10 Baseline:  Goal status: INITIAL  4.  Pt will soak 0 pads/ day Baseline:  Goal status: INITIAL   PLAN:  PT FREQUENCY: 1-2x/week  PT DURATION: 12 weeks  PLANNED INTERVENTIONS: Therapeutic exercises, Therapeutic activity, Neuromuscular re-education, Balance training, Gait training, Patient/Family education, Self Care, Joint mobilization, Dry Needling, Electrical stimulation, Spinal manipulation, Spinal mobilization, scar mobilization, Biofeedback, and Manual therapy  PLAN FOR NEXT SESSION: internal pelvic floor muscle assessment and treatment  Kashawn Manzano, PT 06/10/23 10:21 AM

## 2023-06-18 ENCOUNTER — Ambulatory Visit: Payer: Managed Care, Other (non HMO) | Attending: Family Medicine | Admitting: Physical Therapy

## 2023-06-18 DIAGNOSIS — M6281 Muscle weakness (generalized): Secondary | ICD-10-CM | POA: Diagnosis present

## 2023-06-18 DIAGNOSIS — M542 Cervicalgia: Secondary | ICD-10-CM | POA: Diagnosis present

## 2023-06-18 DIAGNOSIS — R252 Cramp and spasm: Secondary | ICD-10-CM | POA: Insufficient documentation

## 2023-06-18 DIAGNOSIS — M546 Pain in thoracic spine: Secondary | ICD-10-CM | POA: Insufficient documentation

## 2023-06-18 NOTE — Therapy (Signed)
OUTPATIENT PHYSICAL THERAPY CERVICAL TREATMENT NOTE   Patient Name: Adrienne Williams MRN: [id number] DOB:03/23/1981, 42 y.o., female Today's Date: 06/18/2023  END OF SESSION:  PT End of Session - 06/18/23 0846     Visit Number 18    Date for PT Re-Evaluation 07/30/23    Authorization Type [employer]    PT Start Time 0847    PT Stop Time 0930    PT Time Calculation (min) 43 min    Activity Tolerance Patient tolerated treatment well              Past Medical History:  Diagnosis Date   Anxiety    Asthma    Depression    Family history of breast cancer    Homero Fellers breech presentation 05/29/2018   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   Hemorrhoids    Hx of varicella    PONV (postoperative nausea and vomiting)    "little nauseated"   Postpartum care following cesarean delivery (6/14) 01/18/2016   Past Surgical History:  Procedure Laterality Date   BREAST BIOPSY Right 02/25/2023   CESAREAN SECTION N/A 01/18/2016   Procedure: Primary CESAREAN SECTION;  Surgeon: Genia Del, MD;  Location: [facility];  Service: Obstetrics;  Laterality: N/A;  EDD: 01/25/16   CESAREAN SECTION N/A 05/29/2018   Procedure: Repeat CESAREAN SECTION;  Surgeon: Shea Evans, MD;  Location: [facility];  Service: Obstetrics;  Laterality: N/A;  EDD: 06/10/18   COLONOSCOPY     CYSTECTOMY  2002   pilonidal region   DIAGNOSTIC LAPAROSCOPY WITH REMOVAL OF ECTOPIC PREGNANCY Right 05/29/2020   Procedure: DIAGNOSTIC LAPAROSCOPY WITH REMOVAL OF ECTOPIC PREGNANCY;  Surgeon: Shea Evans, MD;  Location: [facility] OR;  Service: Gynecology;  Laterality: Right;   MOLE REMOVAL     UNILATERAL SALPINGECTOMY Right 05/29/2020   Procedure: UNILATERAL SALPINGECTOMY;  Surgeon: Shea Evans, MD;  Location: [facility] OR;  Service: Gynecology;  Laterality: Right;   WISDOM TOOTH EXTRACTION     Patient Active Problem List   Diagnosis Date Noted   Poor sleep 05/22/2023   Neck pain, chronic  06/27/2022   Fibromyalgia 06/27/2022   Myofascial pain 06/27/2022   Internal hemorrhoids 06/13/2022   Rectal bleeding 06/13/2022   Family history of breast cancer 01/18/2022   Monoallelic mutation of CHEK2 gene in female patient 01/18/2022   Anxiety and depression 05/31/2018   Rh negative, maternal 05/31/2018   Homero Fellers breech presentation 05/29/2018   Status post repeat low transverse cesarean section 05/29/2018   Postpartum care following cesarean delivery (10/24) 05/29/2018    PCP: Jarrett Soho, PA-C   REFERRING PROVIDER: Jackelyn Poling, DO  REFERRING DIAG: [id number] (ICD-10-CM) - Other muscle spasm  THERAPY DIAG:  Muscle weakness (generalized)  Pain in thoracic spine  Cervicalgia  Rationale for Evaluation and Treatment: Rehabilitation  ONSET DATE: 03/26/2023  SUBJECTIVE:  SUBJECTIVE STATEMENT: I started swimming.  It's going well.  I wanted to talk to you about what I'm working there.  Weekend chest tightness. I only got 4 1/2 hours of sleep.   EVAL: Patient reports she was helping her husband install garage seal on 03-10-23.  She felt severe pain in thoracic spine and neck onset around 03-13-23 and explains that she noticed that she had a lot of swelling and had to get a bigger bra and extender for her bra due to the swelling.  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  Difficulty with driving, sleeping, IADL's self care and working.  I have two kids and caring for them and the dogs has been very challenging.  I am usually very active but unable to do anything right now.  Hand dominance: Right  PERTINENT HISTORY:  na  PAIN:  PAIN:  Are you having pain? Yes NPRS scale:  3-4/10 Pain location: chest, low shoulder blades Aggravating factors:  stress; overstretching Relieving factors: DN   PRECAUTIONS: None  RED FLAGS: None     WEIGHT BEARING RESTRICTIONS: No  FALLS:  Has patient fallen in last 6 months? No  LIVING ENVIRONMENT: Lives with: lives with their family and lives with their spouse Lives in: House/apartment   OCCUPATION: Pharmacist  PLOF: Independent, Independent with basic ADLs, Independent with household mobility without device, Independent with community mobility without device, Independent with homemaking with ambulation, Independent with gait, and Independent with transfers  PATIENT GOALS: to eliminate pain and be able to work and do my usual daily activities and sleep without interruption The Patient-Specific Functional Scale  Initial:  I am going to ask you to identify up to 3 important activities that you are unable to do or are having difficulty with as a result of this problem.  Today are there any activities that you are unable to do or having difficulty with because of this?  (Patient shown scale and patient rated each activity)  Follow up: When you first came in you had difficulty performing these activities.  Today do you still have difficulty?  Patient-Specific activity scoring scheme (Point to one number):  0 1 2 3 4 5 6 7 8 9  10 Unable                                                                                                          Able to perform To perform                                                                                                    activity  at the same Activity         Level as before                                                                                                                       Injury or problem  Activity       Stamina to work all day                                                                       10/15     5                   2.             Driving the kids to school and picking up                                    10/15             6                      3.            Doing extended housework                                                            10/15                4   4. Able to push the cart at [employer]    NEXT MD VISIT: 04/10/23  OBJECTIVE:   DIAGNOSTIC FINDINGS:  MRI showed small lesion 4 mm breast and had biopsy in July; normal but recommended another MRI and biopsy   PATIENT SURVEYS:  NDI 80% self disability score  NDI 10/15:  54%  COGNITION: Overall cognitive status: Within functional limits for tasks assessed  SENSATION: WFL  POSTURE: rounded shoulders  PALPATION: Tight bands and trigger points in R > L upper traps and rhomboids   CERVICAL ROM:   Active ROM A/PROM (deg) eval 10/15  Flexion WNL WNL  Extension WNL WNL  Right lateral flexion WNL WNL  Left lateral flexion WNL WNL  Right rotation WNL WNL  Left rotation WNL WNL   (Blank rows = not tested)   UPPER EXTREMITY MMT: 5/5 throughout bilateral upper extremities but patient states that she "is going to be so sore" from the muscle testing throughout the test.   TODAY'S TREATMENT:   1/12  Nu-Step L5  ( green machine) 5 min Discussion of swimming and benefits for strengthening upper back [facility] Standing cable row 15# 12x Yellow band 90/90 external rotation and press overhead 5x, then 2x more Standing 30# lat pulls 10x; 35# 5x Green band single arm pull with opp hip flexion 10x right/left  Manual therapy to bil upper traps, neck musculature Trigger Point Dry-Needling  Treatment instructions: Expect mild to moderate muscle soreness. S/S of pneumothorax if dry needled over a lung field, and to seek immediate medical attention should they occur. Patient verbalized understanding of these instructions and education.  Patient Consent Given: Yes Education handout provided: Previously provided Muscles treated:  bil upper traps; right SCM; bil pectorals, bil subscapularis  Electrical stimulation performed:  No Parameters: N/A Treatment response/outcome: dec tender point size/number, twitch response and decreased muscle tension   10/29 Nu-Step L3  ( blue machine) 5 min Seated peanut ball to lengthen trunk musculature 5x right/left Sidelying pelvic oscillations using peanut ball Open books with peanut ball 10x  Recorded pt doing the peanut ball ex's with her phone since they are not on [employer] (pt interested in getting a peanut for home) Standing cable row 15# 10x Standing 30# lat pulls 15x   Manual therapy to bil upper traps, scapular musculature Trigger Point Dry-Needling  Treatment instructions: Expect mild to moderate muscle soreness. S/S of pneumothorax if dry needled over a lung field, and to seek immediate medical attention should they occur. Patient verbalized understanding of these instructions and education.  Patient Consent Given: Yes Education handout provided: Previously provided Muscles treated: bil teres, bil lats; bil upper traps and levator scap muscles Electrical stimulation performed: No Parameters: N/A Treatment response/outcome: dec tender point size/number, twitch response and decreased muscle tension  Heat 10 min     10/22 Nu-Step L3  ( blue machine) 5 min Standing cable row 15# 8x Wall lift offs with green loop 4x  (very challenging) Squats with 10# KB 10x Farmer's carry hold with BOSU alternating step taps 10x right/left sides Standing 30# lat pulls 12x   Manual therapy to bil upper traps cervical musculature Trigger Point Dry-Needling  Treatment instructions: Expect mild to moderate muscle soreness. S/S of pneumothorax if dry needled over a lung field, and to seek immediate medical attention should they occur. Patient verbalized understanding of these instructions and education.  Patient Consent Given: Yes Education handout provided: Previously provided Muscles treated: bil teres, bil lats; bil upper trap (ant and post approach); bil pecs Electrical  stimulation performed: No Parameters: N/A Treatment response/outcome: dec tender point size/number, twitch response and decreased muscle tension  Heat 2 min            PATIENT EDUCATION:  Education details:  HEP Person educated: Patient Education method: Programmer, multimedia, Facilities manager, Verbal cues, and Handouts Education comprehension: verbalized understanding, returned demonstration, and verbal cues required  HOME EXERCISE PROGRAM: Access Code: [email or web address] URL: [email or web address] Date: 05/14/2023 Prepared by: Lavinia Sharps  Exercises - Supine Chest Stretch on Foam Roll  - 1 x daily - 7 x weekly - 3 sets - 10 reps - Thoracic Extension Mobilization on Foam Roll  - 1 x daily - 7 x weekly - 3 sets - 10 reps - Supine Shoulder Horizontal Abduction with Resistance  - 1 x daily - 7 x weekly - 3 sets - 10 reps - Supine PNF D2 Flexion with Resistance  - 1 x daily - 7 x weekly - 3 sets - 10 reps -  Latissimus Dorsi Stretch at Wall  - 2 x daily - 7 x weekly - 1 sets - 2-3 reps - 20 hold - Prone Chest Stretch on Chair  - 1 x daily - 7 x weekly - 1 sets - 2-3 reps - 20 hold - Standing Thoracic Open Book at Wall  - 1 x daily - 7 x weekly - 1 sets - 5 reps - Standing Cervical Retraction  - 3-4 x daily - 7 x weekly - 1 sets - 5 reps - Wall Push Up  - 1 x daily - 7 x weekly - 1 sets - 10 reps - Wall Angels  - 1 x daily - 7 x weekly - 1 sets - 10 reps - Shoulder Overhead Press in Flexion with Dumbbells  - 1 x daily - 7 x weekly - 2 sets - 5 reps - Squat with Chair Touch  - 1 x daily - 7 x weekly - 2 sets - 5 reps - Half Deadlift with Kettlebell  - 1 x daily - 7 x weekly - 2 sets - 5 reps - Farmer's Carry with Kettlebells  - 1 x daily - 7 x weekly - 1 sets - 10 reps - Standing Hip Circles  - 1 x daily - 7 x weekly - 1 sets - 10 reps - Seated Assisted Cervical Rotation with Towel  - 1 x daily - 7 x weekly - 1 sets - 10 reps - Cervical Extension AROM with Strap  - 1 x daily - 7 x weekly - 1  sets - 10 reps - Supine Diaphragmatic Breathing  - 1 x daily - 7 x weekly - 1 sets - 10 reps - Doorway Pec Stretch at 90 Degrees Abduction  - 1 x daily - 7 x weekly - 2 sets - 2 reps - 20 hold - Doorway Pec Stretch at 120 Degrees Abduction  - 1 x daily - 7 x weekly - 2 sets - 2 reps - 20 hold - Standing Quadratus Lumborum Stretch with Doorway  - 1 x daily - 7 x weekly - 2 sets - 2 reps - 20 hold - Standing Lat Pull Down with Resistance - Elbows Bent  - 1 x daily - 7 x weekly - 1 sets - 10 reps - Shoulder extension with resistance - Neutral  - 1 x daily - 7 x weekly - 1 sets - 10 reps - Standing Plank on Wall  - 1 x daily - 7 x weekly - 1 sets - 5 reps - 5 hold - Standing Full Side Plank on Wall  - 1 x daily - 7 x weekly - 1 sets - 3 reps - 5 hold -ASSESSMENT:  CLINICAL IMPRESSION: Able to continue with a strengthening progression emphasizing posterior chain muscles.  Therapist providing verbal cues to optimize technique with  exercises in order to achieve the greatest benefit. The patient benefits significantly from dry needling and manual therapy to stimulate underlying myofascial trigger points and muscular tissue for management of neuromusculoskeletal pain with much improved soft tissue mobility  following treatment session.      OBJECTIVE IMPAIRMENTS: increased fascial restrictions and increased muscle spasms.   ACTIVITY LIMITATIONS: carrying, lifting, bending, sitting, squatting, sleeping, transfers, bed mobility, bathing, dressing, and hygiene/grooming  PARTICIPATION LIMITATIONS: meal prep, cleaning, laundry, interpersonal relationship, driving, shopping, community activity, and occupation  PERSONAL FACTORS: Education, Past/current experiences, and 3+ comorbidities: anxiety , depression and chronic pain  are also affecting patient's functional outcome.   REHAB  POTENTIAL: Fair due to   CLINICAL DECISION MAKING: Unstable/unpredictable  EVALUATION COMPLEXITY: High   GOALS: Goals  reviewed with patient? Yes  SHORT TERM GOALS: Target date: 04/24/2023   Patient to report pain no greater than 2/10  Baseline:  Goal status: ongoing  2.  Patient will be independent with initial HEP  Baseline:  Goal status: met 9/17    LONG TERM GOALS: Target date:07/30/2023   Patient to report an overall improvement at 60% with home and work ADLs Baseline:  Goal status: revised  2.  Patient to be independent with advanced HEP  Baseline:  Goal status:ongoing  3.  Patient to be able to sleep through the night  Baseline:  Goal status: ongoing 4.  Patient to be able to return to work Baseline:  Goal status: met 10/15  5.  Neck pain disablity score to improve by 5 Baseline:  Goal status: met 10/15  6.  Patient to report PSFS score of 6 with doing housework for extended period of time Baseline:  Goal status: INITIAL 7.  Patient will report PSFS of 8 with driving kids to and from school New  8. PSFS of 6 with stamina to work a long shift new   PLAN:  PT FREQUENCY: 1x/week  PT DURATION: 10 weeks  PLANNED INTERVENTIONS: Therapeutic exercises, Therapeutic activity, Neuromuscular re-education, Balance training, Gait training, Patient/Family education, Self Care, Joint mobilization, Vestibular training, Canalith repositioning, Aquatic Therapy, Dry Needling, Electrical stimulation, Spinal mobilization, Cryotherapy, Moist heat, Taping, Vasopneumatic device, Traction, Ultrasound, Ionotophoresis 4mg /ml Dexamethasone, and Manual therapy  PLAN FOR NEXT SESSION:  steering wheels side/lateral wall planks; DN;  Nu-step, review HEP, progress postural strengthening including resistance training upper quarter  Lavinia Sharps, PT 06/18/23 6:23 PM Phone: [phone] Fax: [phone]

## 2023-06-19 ENCOUNTER — Encounter: Payer: Self-pay | Admitting: Physical Medicine and Rehabilitation

## 2023-06-19 MED ORDER — NALTREXONE HCL (PAIN) 1.5 MG PO CAPS: 4.5000 mg | ORAL_CAPSULE | Freq: Every day | ORAL | 5 refills | Status: AC

## 2023-06-20 ENCOUNTER — Ambulatory Visit: Payer: Managed Care, Other (non HMO) | Admitting: Physical Therapy

## 2023-06-20 DIAGNOSIS — M6281 Muscle weakness (generalized): Secondary | ICD-10-CM

## 2023-06-20 DIAGNOSIS — M62838 Other muscle spasm: Secondary | ICD-10-CM

## 2023-06-20 DIAGNOSIS — R252 Cramp and spasm: Secondary | ICD-10-CM

## 2023-06-20 NOTE — Therapy (Signed)
OUTPATIENT PHYSICAL THERAPY CERVICAL TREATMENT NOTE   Patient Name: CARLEY ABELE MRN: [id number] DOB:08-09-1980, 42 y.o., female Today's Date: 06/20/2023  END OF SESSION:  PT End of Session - 06/20/23 1535     Visit Number 19    Date for PT Re-Evaluation 07/30/23    Authorization Type [employer]    PT Start Time 1445    PT Stop Time 1535    PT Time Calculation (min) 50 min    Activity Tolerance Patient tolerated treatment well    Behavior During Therapy [facility] for tasks assessed/performed               Past Medical History:  Diagnosis Date   Anxiety    Asthma    Depression    Family history of breast cancer    Frank breech presentation 05/29/2018   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   Hemorrhoids    Hx of varicella    PONV (postoperative nausea and vomiting)    "little nauseated"   Postpartum care following cesarean delivery (6/14) 01/18/2016   Past Surgical History:  Procedure Laterality Date   BREAST BIOPSY Right 02/25/2023   CESAREAN SECTION N/A 01/18/2016   Procedure: Primary CESAREAN SECTION;  Surgeon: Genia Del, MD;  Location: [facility];  Service: Obstetrics;  Laterality: N/A;  EDD: 01/25/16   CESAREAN SECTION N/A 05/29/2018   Procedure: Repeat CESAREAN SECTION;  Surgeon: Shea Evans, MD;  Location: [facility]TH[facility] SU[facility];  Service: Obstetrics;  Laterality: N/A;  EDD: 06/10/18   COLONOSCOPY     CYSTECTOMY  2002   pilonidal region   DIAGNOSTIC LAPAROSCOPY WITH REMOVAL OF ECTOPIC PREGNANCY Right 05/29/2020   Procedure: DIAGNOSTIC LAPAROSCOPY WITH REMOVAL OF ECTOPIC PREGNANCY;  Surgeon: Shea Evans, MD;  Location: [facility] OR;  Service: Gynecology;  Laterality: Right;   MOLE REMOVAL     UNILATERAL SALPINGECTOMY Right 05/29/2020   Procedure: UNILATERAL SALPINGECTOMY;  Surgeon: Shea Evans, MD;  Location: [facility] OR;  Service: Gynecology;  Laterality: Right;   WISDOM TOOTH EXTRACTION     Patient Active Problem List    Diagnosis Date Noted   Poor sleep 05/22/2023   Neck pain, chronic 06/27/2022   Fibromyalgia 06/27/2022   Myofascial pain 06/27/2022   Internal hemorrhoids 06/13/2022   Rectal bleeding 06/13/2022   Family history of breast cancer 01/18/2022   Monoallelic mutation of CHEK2 gene in female patient 01/18/2022   Anxiety and depression 05/31/2018   Rh negative, maternal 05/31/2018   Homero Fellers breech presentation 05/29/2018   Status post repeat low transverse cesarean section 05/29/2018   Postpartum care following cesarean delivery (10/24) 05/29/2018    PCP: Jarrett Soho, PA-C   REFERRING PROVIDER: Jackelyn Poling, DO  REFERRING DIAG: [phone] (ICD-10-CM) - Other muscle spasm  THERAPY DIAG:  Muscle weakness (generalized)  Other muscle spasm  Cramp and spasm  Rationale for Evaluation and Treatment: Rehabilitation  ONSET DATE: 03/26/2023  SUBJECTIVE:  SUBJECTIVE STATEMENT: I started swimming.  It's going well.  I wanted to talk to you about what I'm working there.  Weekend chest tightness. I only got 4 1/2 hours of sleep.   EVAL: Patient reports she was helping her husband install garage seal on 03-10-23.  She felt severe pain in thoracic spine and neck onset around 03-13-23 and explains that she noticed that she had a lot of swelling and had to get a bigger bra and extender for her bra due to the swelling.  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  Difficulty with driving, sleeping, IADL's self care and working.  I have two kids and caring for them and the dogs has been very challenging.  I am usually very active but unable to do anything right now.  Hand dominance: Right  PERTINENT HISTORY:  na  PAIN:  PAIN:  Are you having pain? Yes NPRS scale:   3-4/10 Pain location: chest, low shoulder blades Aggravating factors: stress; overstretching Relieving factors: DN   PRECAUTIONS: None  RED FLAGS: None     WEIGHT BEARING RESTRICTIONS: No  FALLS:  Has patient fallen in last 6 months? No  LIVING ENVIRONMENT: Lives with: lives with their family and lives with their spouse Lives in: House/apartment   OCCUPATION: Pharmacist  PLOF: Independent, Independent with basic ADLs, Independent with household mobility without device, Independent with community mobility without device, Independent with homemaking with ambulation, Independent with gait, and Independent with transfers  PATIENT GOALS: to eliminate pain and be able to work and do my usual daily activities and sleep without interruption The Patient-Specific Functional Scale  Initial:  I am going to ask you to identify up to 3 important activities that you are unable to do or are having difficulty with as a result of this problem.  Today are there any activities that you are unable to do or having difficulty with because of this?  (Patient shown scale and patient rated each activity)  Follow up: When you first came in you had difficulty performing these activities.  Today do you still have difficulty?  Patient-Specific activity scoring scheme (Point to one number):  0 1 2 3 4 5 6 7 8 9  10 Unable                                                                                                          Able to perform To perform                                                                                                    activity  at the same Activity         Level as before                                                                                                                       Injury or problem  Activity       Stamina to work all day                                                                       10/15     5                   2.             Driving the kids  to school and picking up                                    10/15            6                      3.            Doing extended housework                                                            10/15                4   4. Able to push the cart at [employer]    NEXT MD VISIT: 04/10/23  OBJECTIVE:   DIAGNOSTIC FINDINGS:  MRI showed small lesion 4 mm breast and had biopsy in July; normal but recommended another MRI and biopsy   PATIENT SURVEYS:  NDI 80% self disability score  NDI 10/15:  54%  COGNITION: Overall cognitive status: Within functional limits for tasks assessed  SENSATION: WFL  POSTURE: rounded shoulders  PALPATION: Tight bands and trigger points in R > L upper traps and rhomboids   CERVICAL ROM:   Active ROM A/PROM (deg) eval 10/15  Flexion WNL WNL  Extension WNL WNL  Right lateral flexion WNL WNL  Left lateral flexion WNL WNL  Right rotation WNL WNL  Left rotation WNL WNL   (Blank rows = not tested)   UPPER EXTREMITY MMT: 5/5 throughout bilateral upper extremities but patient states that she "is going to be so sore" from the muscle testing throughout the test.   TODAY'S TREATMENT:   1/12  Nu-Step L5  ( green machine) 5 min Discussion of swimming and benefits for strengthening upper back [facility] cable row 15# 12x Yellow band 90/90 external rotation and press overhead 5x, then 2x more Standing 30# lat pulls 10x; 35# 5x Green band single arm pull with opp hip flexion 10x right/left  Manual therapy to bil upper traps, neck musculature Trigger Point Dry-Needling  Treatment instructions: Expect mild to moderate muscle soreness. S/S of pneumothorax if dry needled over a lung field, and to seek immediate medical attention should they occur. Patient verbalized understanding of these instructions and education.  Patient Consent Given: Yes Education handout provided: Previously provided Muscles treated:  bil upper traps; right  SCM; bil pectorals, bil subscapularis  Electrical stimulation performed: No Parameters: N/A Treatment response/outcome: dec tender point size/number, twitch response and decreased muscle tension   10/29 Nu-Step L3  ( blue machine) 5 min Seated peanut ball to lengthen trunk musculature 5x right/left Sidelying pelvic oscillations using peanut ball Open books with peanut ball 10x  Recorded pt doing the peanut ball ex's with her phone since they are not on [employer] (pt interested in getting a peanut for home) Standing cable row 15# 10x Standing 30# lat pulls 15x   Manual therapy to bil upper traps, scapular musculature Trigger Point Dry-Needling  Treatment instructions: Expect mild to moderate muscle soreness. S/S of pneumothorax if dry needled over a lung field, and to seek immediate medical attention should they occur. Patient verbalized understanding of these instructions and education.  Patient Consent Given: Yes Education handout provided: Previously provided Muscles treated: bil teres, bil lats; bil upper traps and levator scap muscles Electrical stimulation performed: No Parameters: N/A Treatment response/outcome: dec tender point size/number, twitch response and decreased muscle tension  Heat 10 min     10/22 Nu-Step L3  ( blue machine) 5 min Standing cable row 15# 8x Wall lift offs with green loop 4x  (very challenging) Squats with 10# KB 10x Farmer's carry hold with BOSU alternating step taps 10x right/left sides Standing 30# lat pulls 12x   Manual therapy to bil upper traps cervical musculature Trigger Point Dry-Needling  Treatment instructions: Expect mild to moderate muscle soreness. S/S of pneumothorax if dry needled over a lung field, and to seek immediate medical attention should they occur. Patient verbalized understanding of these instructions and education.  Patient Consent Given: Yes Education handout provided: Previously provided Muscles treated: bil teres, bil  lats; bil upper trap (ant and post approach); bil pecs Electrical stimulation performed: No Parameters: N/A Treatment response/outcome: dec tender point size/number, twitch response and decreased muscle tension  Heat 2 min            PATIENT EDUCATION:  Education details:  HEP Person educated: Patient Education method: Programmer, multimedia, Facilities manager, Verbal cues, and Handouts Education comprehension: verbalized understanding, returned demonstration, and verbal cues required  HOME EXERCISE PROGRAM: Access Code: [email or web address] URL: [email or web address] Date: 05/14/2023 Prepared by: Lavinia Sharps  Exercises - Supine Chest Stretch on Foam Roll  - 1 x daily - 7 x weekly - 3 sets - 10 reps - Thoracic Extension Mobilization on Foam Roll  - 1 x daily - 7 x weekly - 3 sets - 10 reps - Supine Shoulder Horizontal Abduction with Resistance  - 1 x daily - 7 x weekly - 3 sets - 10 reps - Supine PNF D2 Flexion with Resistance  - 1 x daily - 7 x weekly - 3 sets - 10 reps -  Latissimus Dorsi Stretch at Wall  - 2 x daily - 7 x weekly - 1 sets - 2-3 reps - 20 hold - Prone Chest Stretch on Chair  - 1 x daily - 7 x weekly - 1 sets - 2-3 reps - 20 hold - Standing Thoracic Open Book at Wall  - 1 x daily - 7 x weekly - 1 sets - 5 reps - Standing Cervical Retraction  - 3-4 x daily - 7 x weekly - 1 sets - 5 reps - Wall Push Up  - 1 x daily - 7 x weekly - 1 sets - 10 reps - Wall Angels  - 1 x daily - 7 x weekly - 1 sets - 10 reps - Shoulder Overhead Press in Flexion with Dumbbells  - 1 x daily - 7 x weekly - 2 sets - 5 reps - Squat with Chair Touch  - 1 x daily - 7 x weekly - 2 sets - 5 reps - Half Deadlift with Kettlebell  - 1 x daily - 7 x weekly - 2 sets - 5 reps - Farmer's Carry with Kettlebells  - 1 x daily - 7 x weekly - 1 sets - 10 reps - Standing Hip Circles  - 1 x daily - 7 x weekly - 1 sets - 10 reps - Seated Assisted Cervical Rotation with Towel  - 1 x daily - 7 x weekly - 1 sets - 10 reps -  Cervical Extension AROM with Strap  - 1 x daily - 7 x weekly - 1 sets - 10 reps - Supine Diaphragmatic Breathing  - 1 x daily - 7 x weekly - 1 sets - 10 reps - Doorway Pec Stretch at 90 Degrees Abduction  - 1 x daily - 7 x weekly - 2 sets - 2 reps - 20 hold - Doorway Pec Stretch at 120 Degrees Abduction  - 1 x daily - 7 x weekly - 2 sets - 2 reps - 20 hold - Standing Quadratus Lumborum Stretch with Doorway  - 1 x daily - 7 x weekly - 2 sets - 2 reps - 20 hold - Standing Lat Pull Down with Resistance - Elbows Bent  - 1 x daily - 7 x weekly - 1 sets - 10 reps - Shoulder extension with resistance - Neutral  - 1 x daily - 7 x weekly - 1 sets - 10 reps - Standing Plank on Wall  - 1 x daily - 7 x weekly - 1 sets - 5 reps - 5 hold - Standing Full Side Plank on Wall  - 1 x daily - 7 x weekly - 1 sets - 3 reps - 5 hold -ASSESSMENT:  CLINICAL IMPRESSION: Able to continue with a strengthening progression emphasizing posterior chain muscles.  Therapist providing verbal cues to optimize technique with  exercises in order to achieve the greatest benefit. The patient benefits significantly from dry needling and manual therapy to stimulate underlying myofascial trigger points and muscular tissue for management of neuromusculoskeletal pain with much improved soft tissue mobility  following treatment session.      OBJECTIVE IMPAIRMENTS: increased fascial restrictions and increased muscle spasms.   ACTIVITY LIMITATIONS: carrying, lifting, bending, sitting, squatting, sleeping, transfers, bed mobility, bathing, dressing, and hygiene/grooming  PARTICIPATION LIMITATIONS: meal prep, cleaning, laundry, interpersonal relationship, driving, shopping, community activity, and occupation  PERSONAL FACTORS: Education, Past/current experiences, and 3+ comorbidities: anxiety , depression and chronic pain  are also affecting patient's functional outcome.   REHAB  POTENTIAL: Fair due to   CLINICAL DECISION MAKING:  Unstable/unpredictable  EVALUATION COMPLEXITY: High   GOALS: Goals reviewed with patient? Yes  SHORT TERM GOALS: Target date: 04/24/2023   Patient to report pain no greater than 2/10  Baseline:  Goal status: ongoing  2.  Patient will be independent with initial HEP  Baseline:  Goal status: met 9/17    LONG TERM GOALS: Target date:07/30/2023   Patient to report an overall improvement at 60% with home and work ADLs Baseline:  Goal status: revised  2.  Patient to be independent with advanced HEP  Baseline:  Goal status:ongoing  3.  Patient to be able to sleep through the night  Baseline:  Goal status: ongoing 4.  Patient to be able to return to work Baseline:  Goal status: met 10/15  5.  Neck pain disablity score to improve by 5 Baseline:  Goal status: met 10/15  6.  Patient to report PSFS score of 6 with doing housework for extended period of time Baseline:  Goal status: INITIAL 7.  Patient will report PSFS of 8 with driving kids to and from school New  8. PSFS of 6 with stamina to work a long shift new   PLAN:  PT FREQUENCY: 1x/week  PT DURATION: 10 weeks  PLANNED INTERVENTIONS: Therapeutic exercises, Therapeutic activity, Neuromuscular re-education, Balance training, Gait training, Patient/Family education, Self Care, Joint mobilization, Vestibular training, Canalith repositioning, Aquatic Therapy, Dry Needling, Electrical stimulation, Spinal mobilization, Cryotherapy, Moist heat, Taping, Vasopneumatic device, Traction, Ultrasound, Ionotophoresis 4mg /ml Dexamethasone, and Manual therapy  PLAN FOR NEXT SESSION:  steering wheels side/lateral wall planks; DN;  Nu-step, review HEP, progress postural strengthening including resistance training upper quarter  Lavinia Sharps, PT 06/20/23 3:38 PM Phone: [phone] Fax: [phone]                                 OUTPATIENT PHYSICAL THERAPY FEMALE PELVIC EVALUATION   Patient  Name: KEYASHIA SINATRA MRN: [id number] DOB:04-Mar-1981, 42 y.o., female Today's Date: 06/20/2023  END OF SESSION:  PT End of Session - 06/20/23 1535     Visit Number 19    Date for PT Re-Evaluation 07/30/23    Authorization Type [employer]    PT Start Time 1445    PT Stop Time 1535    PT Time Calculation (min) 50 min    Activity Tolerance Patient tolerated treatment well    Behavior During Therapy [facility] for tasks assessed/performed               Past Medical History:  Diagnosis Date   Anxiety    Asthma    Depression    Family history of breast cancer    Frank breech presentation 05/29/2018   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   Hemorrhoids    Hx of varicella    PONV (postoperative nausea and vomiting)    "little nauseated"   Postpartum care following cesarean delivery (6/14) 01/18/2016   Past Surgical History:  Procedure Laterality Date   BREAST BIOPSY Right 02/25/2023   CESAREAN SECTION N/A 01/18/2016   Procedure: Primary CESAREAN SECTION;  Surgeon: Genia Del, MD;  Location: [facility];  Service: Obstetrics;  Laterality: N/A;  EDD: 01/25/16   CESAREAN SECTION N/A 05/29/2018   Procedure: Repeat CESAREAN SECTION;  Surgeon: Shea Evans, MD;  Location: [facility]IRTHING SUIT[facility];  Service: Obstetrics;  Laterality: N/A;  EDD: 06/10/18  COLONOSCOPY     CYSTECTOMY  2002   pilonidal region   DIAGNOSTIC LAPAROSCOPY WITH REMOVAL OF ECTOPIC PREGNANCY Right 05/29/2020   Procedure: DIAGNOSTIC LAPAROSCOPY WITH REMOVAL OF ECTOPIC PREGNANCY;  Surgeon: Shea Evans, MD;  Location: [facility] OR;  Service: Gynecology;  Laterality: Right;   MOLE REMOVAL     UNILATERAL SALPINGECTOMY Right 05/29/2020   Procedure: UNILATERAL SALPINGECTOMY;  Surgeon: Shea Evans, MD;  Location: [facility] OR;  Service: Gynecology;  Laterality: Right;   WISDOM TOOTH EXTRACTION     Patient Active Problem List   Diagnosis Date Noted   Poor sleep 05/22/2023   Neck pain, chronic 06/27/2022    Fibromyalgia 06/27/2022   Myofascial pain 06/27/2022   Internal hemorrhoids 06/13/2022   Rectal bleeding 06/13/2022   Family history of breast cancer 01/18/2022   Monoallelic mutation of CHEK2 gene in female patient 01/18/2022   Anxiety and depression 05/31/2018   Rh negative, maternal 05/31/2018   Homero Fellers breech presentation 05/29/2018   Status post repeat low transverse cesarean section 05/29/2018   Postpartum care following cesarean delivery (10/24) 05/29/2018    PCP: Jarrett Soho, PA-C PCP - General  REFERRING PROVIDER: Vick Frees, MD Ref Provider  REFERRING DIAG: N39.3 (ICD-10-CM) - Stress incontinence (female) (female)  THERAPY DIAG:  Muscle weakness (generalized)  Other muscle spasm  Cramp and spasm  Rationale for Evaluation and Treatment: Rehabilitation  ONSET DATE: 2019  SUBJECTIVE:    Pt reports that she is feeling better, she got out of bed for PT. Exercise is her stress reliever. When she is running, coughing and sneezing still gets her. Pt reports that she is always constipated.Has been constipated for a long time. She went horseback riding, got a massage, felt really good the other day.   Pt comes to therapy crying this morning, slept 3 hours, stated that husband had a breakdown and was not very nice to her. Reports that PT would help. Pt reports that she had an episode of leaking, she went  running. She was upset and went running.  Pain has not been nearly as severe, she has been massaging her scars and it really helped. She had a massage as well.  She reports that she has an old knee injury and right LOWER EXTREMITY flares up sometimes.     Pt reports that she injured herself, seeing ortho, has had some SUI as well. Reports that she has had severe pain in her upper body, meditation helps. Reports that everything is tight. Privates and hamstrings are tight, Has had a lot of swelling as well - in her pelvis, abdominal as well. Leaks with running.  SUBJECTIVE STATEMENT:  Fluid intake: Yes: mostly water, seltzer, liquid IV    PAIN:  Are you having pain? Yes NPRS scale: 7/10 Pain location: External  Pain type: throbbing, tight, and tingling Pain description: constant, stabbing if super tight  Aggravating factors: walking or running, being on her feet, sitting, standing Relieving factors: meditation, heat, stretching, does not know the right stretches, if she tightens her core  PRECAUTIONS: None  RED FLAGS: None   WEIGHT BEARING RESTRICTIONS: No  FALLS:  Has patient fallen in last 6 months? No  LIVING ENVIRONMENT: Lives with: lives with their family Lives in: House/apartment Stairs: No Has following equipment at home: None  OCCUPATION: pharmacist- retail, very difficult right now  PLOF: Independent  PATIENT GOALS: to be able to relieve pain without having to spend a ton of time on it, has 2 young kids and a job. Pain is a priority. Rare that the leaking happens.   PERTINENT HISTORY:   Sexual abuse: No  BOWEL MOVEMENT: Pain with bowel movement: Yes when stuff is super tight Type of bowel movement:Strain Yes sometimes Fully empty rectum: Yes: when she goes Leakage: No only when the hemorrhoid is really bad, it was crazy, it' better now Pads: No Fiber supplement: No  URINATION: Pain with urination: No Fully empty bladder: No sometimes feels like she struggles Stream: Weak Urgency: No, used to Frequency: not now Leakage: Coughing, Sneezing, and Exercise, running Pads: Yes:    INTERCOURSE: Pain with intercourse: Initial Penetration, During Penetration, After Intercourse, During Climax, and Pain Interrupts Intercourse Ability to have vaginal penetration:  Yes: has not in a while Climax: yes Marinoff Scale:  2/3  PREGNANCY: Vaginal deliveries 0 Tearing No C-section deliveries 2 Currently pregnant No  PROLAPSE: Check with internal   OBJECTIVE:  Note: Objective measures were completed at Evaluation unless otherwise noted. Very tight and tender abdominal scars Pt irritable Guarded gait Guarded and somewhat anxious     COGNITION: Overall cognitive status: Within functional limits for tasks assessed     SENSATION: Light touch: Appears intact Proprioception: Appears intact  MUSCLE LENGTH: Hamstrings: tight bilateral Thomas test: Right tight ; Left tight    POSTURE: rounded shoulders and forward head  PELVIC ALIGNMENT:  LUMBARAROM/PROM:  A/PROM A/PROM  eval  Flexion limited  Extension limited  Right lateral flexion limited  Left lateral flexion   Right rotation   Left rotation    (Blank rows = not tested)  LOWER EXTREMITY ROM:  Passive ROM Right eval Left eval  Hip flexion limited limited  Hip extension    Hip abduction limited limited  Hip adduction    Hip internal rotation    Hip external rotation    Knee flexion    Knee extension    Ankle dorsiflexion    Ankle plantarflexion    Ankle inversion    Ankle eversion     (Blank rows = not tested)  LOWER EXTREMITY MMT: 4/5 grossly overall  PALPATION:   General  upper chest breathing and restrictions throughout abdomen                External Perineal Exam wfl                             Internal Pelvic Floor decreased strength, high tone, tight and tender throughout Patient confirms identification and approves PT to assess internal pelvic floor and treatment Yes  PELVIC MMT:   MMT eval  Vaginal 2/5  Internal Anal Sphincter 5/5  External Anal Sphincter 5/5  Puborectalis   Diastasis Recti Yes- 2 fingers above umbilicus  (Blank rows = not tested)        TONE: high  PROLAPSE: no TODAY'S TREATMENT:                                                                                                                               DATE:    Manual- rectal pelvic floor muscle release- muscle energy Trial of dry needling bilateral rectus abdominis  Therapeutic exercises-child's pose, reverse kegel/ DB There acts- education on constipation management, wand, exercise for stress reduction   Manual- internal pelvic floor muscles assessment, perineal massage performed and patient educated on   Therapeutic activities- education on lubricant, wand, practiced diaphragmatic breathing with thera band reed, reverse kegel on ball, rolled up towel   PATIENT EDUCATION:  Education details/ manual performed: scar massage and cupping Person educated: Patient Education method: Programmer, multimedia, Demonstration, and Handouts Education comprehension: verbalized understanding, returned demonstration, verbal cues required, tactile cues required, and needs further education  HOME EXERCISE PROGRAM:   [email or web address]  ASSESSMENT:  CLINICAL IMPRESSION: Pt with high tone and tender points rectally, some in her bilateral rectus abdominis. Continued down training of pelvic floor. Pt seems to be doing better overall, still holding tension in her pelvic floor. Discussed trial of DN pelvic floor. Added abdominal massage to her HEP to help reduce constipation.    Pt with high tone pelvic floor vaginally, decreased strength d/t tightness. Tenderness and tightness in superficial pelvic floor muscles. Will benefit from down training to reduce pain with intercourse and improve SUI with running. Abdominal scars with much less tenderness today. Pt seems to be progressing well.     Patient is a 42 y.o. F who was seen today for physical therapy evaluation and treatment for stress urinary incontinence. At this point her priority is treating her abdominal and pelvic pain, she reported that she has a lot of irritation post intercourse as well and pain and tightness throughout her abdomen, low back and hips. She did well today with treatment  and manual therapy on her tight and tender abdominal scars and stretches. Has limited PROM and guarded movement throughout her low back and hips as well as diastasis rectus. Patient will benefit from PT to reduce pain and urinary incontinence and improve QOL.  OBJECTIVE IMPAIRMENTS: decreased activity tolerance, decreased coordination, decreased endurance, decreased mobility, decreased ROM, decreased strength, increased fascial restrictions, increased muscle spasms, impaired flexibility, and pain.   ACTIVITY LIMITATIONS: bending  PARTICIPATION LIMITATIONS: interpersonal relationship, occupation, and yard work  PERSONAL FACTORS: Time since onset of injury/illness/exacerbation are also affecting patient's functional outcome.   REHAB POTENTIAL: Good  CLINICAL DECISION MAKING: Stable/uncomplicated  EVALUATION COMPLEXITY: Low   GOALS: Goals reviewed with patient? Yes  SHORT TERM GOALS: Target date: 06/10/2023    Pt will be independent with HEP.   Baseline: Goal status: INITIAL  2.  Pt will be able to run/workout for 30 minutes without leakage or discomfort  Baseline:  Goal status: INITIAL  3.  Pt will be independent with diaphragmatic breathing and down training activities in order to improve pelvic floor relaxation.  Baseline:  Goal status: INITIAL   LONG TERM GOALS: Target date: 08/05/2023    Pt will be independent with advanced HEP.   Baseline:  Goal status: INITIAL  2.  Pt will report decreased pelvic pain to max 3/10  Baseline:  Goal status: INITIAL  3.  Pt will report reduced pain abdominal scars to max 3/10 Baseline:  Goal status: INITIAL  4.  Pt will soak 0 pads/ day Baseline:  Goal status: INITIAL   PLAN:  PT FREQUENCY: 1-2x/week  PT DURATION: 12 weeks  PLANNED INTERVENTIONS: Therapeutic exercises, Therapeutic activity, Neuromuscular re-education, Balance training, Gait training, Patient/Family education, Self Care, Joint mobilization, Dry Needling,  Electrical stimulation, Spinal manipulation, Spinal mobilization, scar mobilization, Biofeedback, and Manual therapy  PLAN FOR NEXT SESSION: internal pelvic floor muscle assessment and treatment  Collie Wernick, PT 06/20/23 3:38 PM

## 2023-06-24 ENCOUNTER — Encounter: Payer: Managed Care, Other (non HMO) | Admitting: Physical Medicine and Rehabilitation

## 2023-06-24 ENCOUNTER — Ambulatory Visit: Payer: Managed Care, Other (non HMO) | Admitting: Physical Therapy

## 2023-06-24 ENCOUNTER — Other Ambulatory Visit: Payer: Self-pay | Admitting: Physical Medicine and Rehabilitation

## 2023-06-24 DIAGNOSIS — R252 Cramp and spasm: Secondary | ICD-10-CM

## 2023-06-24 DIAGNOSIS — M6281 Muscle weakness (generalized): Secondary | ICD-10-CM

## 2023-06-24 DIAGNOSIS — M542 Cervicalgia: Secondary | ICD-10-CM

## 2023-06-24 DIAGNOSIS — M62838 Other muscle spasm: Secondary | ICD-10-CM

## 2023-06-24 DIAGNOSIS — M546 Pain in thoracic spine: Secondary | ICD-10-CM

## 2023-06-24 DIAGNOSIS — R279 Unspecified lack of coordination: Secondary | ICD-10-CM

## 2023-06-24 DIAGNOSIS — G8929 Other chronic pain: Secondary | ICD-10-CM

## 2023-06-24 NOTE — Therapy (Signed)
OUTPATIENT PHYSICAL THERAPY CERVICAL TREATMENT NOTE   Patient Name: Adrienne Williams MRN: [id number] DOB:04-May-1981, 42 y.o., female Today's Date: 06/24/2023  END OF SESSION:  PT End of Session - 06/24/23 0932     Visit Number 20    Date for PT Re-Evaluation 07/30/23    Authorization Type [employer]    PT Start Time 0930    PT Stop Time 1015    PT Time Calculation (min) 45 min    Activity Tolerance Patient tolerated treatment well    Behavior During Therapy [facility] for tasks assessed/performed               Past Medical History:  Diagnosis Date   Anxiety    Asthma    Depression    Family history of breast cancer    Frank breech presentation 05/29/2018   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   Hemorrhoids    Hx of varicella    PONV (postoperative nausea and vomiting)    "little nauseated"   Postpartum care following cesarean delivery (6/14) 01/18/2016   Past Surgical History:  Procedure Laterality Date   BREAST BIOPSY Right 02/25/2023   CESAREAN SECTION N/A 01/18/2016   Procedure: Primary CESAREAN SECTION;  Surgeon: Genia Del, MD;  Location: [facility];  Service: Obstetrics;  Laterality: N/A;  EDD: 01/25/16   CESAREAN SECTION N/A 05/29/2018   Procedure: Repeat CESAREAN SECTION;  Surgeon: Shea Evans, MD;  Location: [facility];  Service: Obstetrics;  Laterality: N/A;  EDD: 06/10/18   COLONOSCOPY     CYSTECTOMY  2002   pilonidal region   DIAGNOSTIC LAPAROSCOPY WITH REMOVAL OF ECTOPIC PREGNANCY Right 05/29/2020   Procedure: DIAGNOSTIC LAPAROSCOPY WITH REMOVAL OF ECTOPIC PREGNANCY;  Surgeon: Shea Evans, MD;  Location: [facility] OR;  Service: Gynecology;  Laterality: Right;   MOLE REMOVAL     UNILATERAL SALPINGECTOMY Right 05/29/2020   Procedure: UNILATERAL SALPINGECTOMY;  Surgeon: Shea Evans, MD;  Location: [facility] OR;  Service: Gynecology;  Laterality: Right;   WISDOM TOOTH EXTRACTION     Patient Active Problem List    Diagnosis Date Noted   Poor sleep 05/22/2023   Neck pain, chronic 06/27/2022   Fibromyalgia 06/27/2022   Myofascial pain 06/27/2022   Internal hemorrhoids 06/13/2022   Rectal bleeding 06/13/2022   Family history of breast cancer 01/18/2022   Monoallelic mutation of CHEK2 gene in female patient 01/18/2022   Anxiety and depression 05/31/2018   Rh negative, maternal 05/31/2018   Homero Fellers breech presentation 05/29/2018   Status post repeat low transverse cesarean section 05/29/2018   Postpartum care following cesarean delivery (10/24) 05/29/2018    PCP: Jarrett Soho, PA-C   REFERRING PROVIDER: Jackelyn Poling, DO  REFERRING DIAG: [id number] (ICD-10-CM) - Other muscle spasm  THERAPY DIAG:  Muscle weakness (generalized)  Other muscle spasm  Cramp and spasm  Unspecified lack of coordination  Cervicalgia  Pain in thoracic spine  Rationale for Evaluation and Treatment: Rehabilitation  ONSET DATE: 03/26/2023  SUBJECTIVE:  SUBJECTIVE STATEMENT: I started swimming.  It's going well.  I wanted to talk to you about what I'm working there.  Weekend chest tightness. I only got 4 1/2 hours of sleep.   EVAL: Patient reports she was helping her husband install garage seal on 03-10-23.  She felt severe pain in thoracic spine and neck onset around 03-13-23 and explains that she noticed that she had a lot of swelling and had to get a bigger bra and extender for her bra due to the swelling.  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  Difficulty with driving, sleeping, IADL's self care and working.  I have two kids and caring for them and the dogs has been very challenging.  I am usually very active but unable to do anything right now.  Hand dominance: Right  PERTINENT  HISTORY:  na  PAIN:  PAIN:  Are you having pain? Yes NPRS scale:  3-4/10 Pain location: chest, low shoulder blades Aggravating factors: stress; overstretching Relieving factors: DN   PRECAUTIONS: None  RED FLAGS: None     WEIGHT BEARING RESTRICTIONS: No  FALLS:  Has patient fallen in last 6 months? No  LIVING ENVIRONMENT: Lives with: lives with their family and lives with their spouse Lives in: House/apartment   OCCUPATION: Pharmacist  PLOF: Independent, Independent with basic ADLs, Independent with household mobility without device, Independent with community mobility without device, Independent with homemaking with ambulation, Independent with gait, and Independent with transfers  PATIENT GOALS: to eliminate pain and be able to work and do my usual daily activities and sleep without interruption The Patient-Specific Functional Scale  Initial:  I am going to ask you to identify up to 3 important activities that you are unable to do or are having difficulty with as a result of this problem.  Today are there any activities that you are unable to do or having difficulty with because of this?  (Patient shown scale and patient rated each activity)  Follow up: When you first came in you had difficulty performing these activities.  Today do you still have difficulty?  Patient-Specific activity scoring scheme (Point to one number):  0 1 2 3 4 5 6 7 8 9  10 Unable                                                                                                          Able to perform To perform                                                                                                    activity  at the same Activity         Level as before                                                                                                                       Injury or problem  Activity       Stamina to work all day                                                                        10/15     5                   2.             Driving the kids to school and picking up                                    10/15            6                      3.            Doing extended housework                                                            10/15                4   4. Able to push the cart at [employer]    NEXT MD VISIT: 04/10/23  OBJECTIVE:   DIAGNOSTIC FINDINGS:  MRI showed small lesion 4 mm breast and had biopsy in July; normal but recommended another MRI and biopsy   PATIENT SURVEYS:  NDI 80% self disability score  NDI 10/15:  54%  COGNITION: Overall cognitive status: Within functional limits for tasks assessed  SENSATION: WFL  POSTURE: rounded shoulders  PALPATION: Tight bands and trigger points in R > L upper traps and rhomboids   CERVICAL ROM:   Active ROM A/PROM (deg) eval 10/15  Flexion WNL WNL  Extension WNL WNL  Right lateral flexion WNL WNL  Left lateral flexion WNL WNL  Right rotation WNL WNL  Left rotation WNL WNL   (Blank rows = not tested)   UPPER EXTREMITY MMT: 5/5 throughout bilateral upper extremities but patient states that she "is going to be so sore" from the muscle testing throughout the test.   TODAY'S TREATMENT:   1/12  Nu-Step L5  ( green machine) 5 min Discussion of swimming and benefits for strengthening upper back [facility] cable row 15# 12x Yellow band 90/90 external rotation and press overhead 5x, then 2x more Standing 30# lat pulls 10x; 35# 5x Green band single arm pull with opp hip flexion 10x right/left  Manual therapy to bil upper traps, neck musculature Trigger Point Dry-Needling  Treatment instructions: Expect mild to moderate muscle soreness. S/S of pneumothorax if dry needled over a lung field, and to seek immediate medical attention should they occur. Patient verbalized understanding of these instructions and education.  Patient Consent Given: Yes Education handout  provided: Previously provided Muscles treated:  bil upper traps; right SCM; bil pectorals, bil subscapularis  Electrical stimulation performed: No Parameters: N/A Treatment response/outcome: dec tender point size/number, twitch response and decreased muscle tension   10/29 Nu-Step L3  ( blue machine) 5 min Seated peanut ball to lengthen trunk musculature 5x right/left Sidelying pelvic oscillations using peanut ball Open books with peanut ball 10x  Recorded pt doing the peanut ball ex's with her phone since they are not on [employer] (pt interested in getting a peanut for home) Standing cable row 15# 10x Standing 30# lat pulls 15x   Manual therapy to bil upper traps, scapular musculature Trigger Point Dry-Needling  Treatment instructions: Expect mild to moderate muscle soreness. S/S of pneumothorax if dry needled over a lung field, and to seek immediate medical attention should they occur. Patient verbalized understanding of these instructions and education.  Patient Consent Given: Yes Education handout provided: Previously provided Muscles treated: bil teres, bil lats; bil upper traps and levator scap muscles Electrical stimulation performed: No Parameters: N/A Treatment response/outcome: dec tender point size/number, twitch response and decreased muscle tension  Heat 10 min     10/22 Nu-Step L3  ( blue machine) 5 min Standing cable row 15# 8x Wall lift offs with green loop 4x  (very challenging) Squats with 10# KB 10x Farmer's carry hold with BOSU alternating step taps 10x right/left sides Standing 30# lat pulls 12x   Manual therapy to bil upper traps cervical musculature Trigger Point Dry-Needling  Treatment instructions: Expect mild to moderate muscle soreness. S/S of pneumothorax if dry needled over a lung field, and to seek immediate medical attention should they occur. Patient verbalized understanding of these instructions and education.  Patient Consent Given:  Yes Education handout provided: Previously provided Muscles treated: bil teres, bil lats; bil upper trap (ant and post approach); bil pecs Electrical stimulation performed: No Parameters: N/A Treatment response/outcome: dec tender point size/number, twitch response and decreased muscle tension  Heat 2 min            PATIENT EDUCATION:  Education details:  HEP Person educated: Patient Education method: Programmer, multimedia, Facilities manager, Verbal cues, and Handouts Education comprehension: verbalized understanding, returned demonstration, and verbal cues required  HOME EXERCISE PROGRAM: Access Code: [email or web address] URL: [email or web address] Date: 05/14/2023 Prepared by: Lavinia Sharps  Exercises - Supine Chest Stretch on Foam Roll  - 1 x daily - 7 x weekly - 3 sets - 10 reps - Thoracic Extension Mobilization on Foam Roll  - 1 x daily - 7 x weekly - 3 sets - 10 reps - Supine Shoulder Horizontal Abduction with Resistance  - 1 x daily - 7 x weekly - 3 sets - 10 reps - Supine PNF D2 Flexion with Resistance  - 1 x daily - 7 x weekly - 3 sets - 10 reps -  Latissimus Dorsi Stretch at Wall  - 2 x daily - 7 x weekly - 1 sets - 2-3 reps - 20 hold - Prone Chest Stretch on Chair  - 1 x daily - 7 x weekly - 1 sets - 2-3 reps - 20 hold - Standing Thoracic Open Book at Wall  - 1 x daily - 7 x weekly - 1 sets - 5 reps - Standing Cervical Retraction  - 3-4 x daily - 7 x weekly - 1 sets - 5 reps - Wall Push Up  - 1 x daily - 7 x weekly - 1 sets - 10 reps - Wall Angels  - 1 x daily - 7 x weekly - 1 sets - 10 reps - Shoulder Overhead Press in Flexion with Dumbbells  - 1 x daily - 7 x weekly - 2 sets - 5 reps - Squat with Chair Touch  - 1 x daily - 7 x weekly - 2 sets - 5 reps - Half Deadlift with Kettlebell  - 1 x daily - 7 x weekly - 2 sets - 5 reps - Farmer's Carry with Kettlebells  - 1 x daily - 7 x weekly - 1 sets - 10 reps - Standing Hip Circles  - 1 x daily - 7 x weekly - 1 sets - 10 reps - Seated  Assisted Cervical Rotation with Towel  - 1 x daily - 7 x weekly - 1 sets - 10 reps - Cervical Extension AROM with Strap  - 1 x daily - 7 x weekly - 1 sets - 10 reps - Supine Diaphragmatic Breathing  - 1 x daily - 7 x weekly - 1 sets - 10 reps - Doorway Pec Stretch at 90 Degrees Abduction  - 1 x daily - 7 x weekly - 2 sets - 2 reps - 20 hold - Doorway Pec Stretch at 120 Degrees Abduction  - 1 x daily - 7 x weekly - 2 sets - 2 reps - 20 hold - Standing Quadratus Lumborum Stretch with Doorway  - 1 x daily - 7 x weekly - 2 sets - 2 reps - 20 hold - Standing Lat Pull Down with Resistance - Elbows Bent  - 1 x daily - 7 x weekly - 1 sets - 10 reps - Shoulder extension with resistance - Neutral  - 1 x daily - 7 x weekly - 1 sets - 10 reps - Standing Plank on Wall  - 1 x daily - 7 x weekly - 1 sets - 5 reps - 5 hold - Standing Full Side Plank on Wall  - 1 x daily - 7 x weekly - 1 sets - 3 reps - 5 hold -ASSESSMENT:  CLINICAL IMPRESSION: Able to continue with a strengthening progression emphasizing posterior chain muscles.  Therapist providing verbal cues to optimize technique with  exercises in order to achieve the greatest benefit. The patient benefits significantly from dry needling and manual therapy to stimulate underlying myofascial trigger points and muscular tissue for management of neuromusculoskeletal pain with much improved soft tissue mobility  following treatment session.      OBJECTIVE IMPAIRMENTS: increased fascial restrictions and increased muscle spasms.   ACTIVITY LIMITATIONS: carrying, lifting, bending, sitting, squatting, sleeping, transfers, bed mobility, bathing, dressing, and hygiene/grooming  PARTICIPATION LIMITATIONS: meal prep, cleaning, laundry, interpersonal relationship, driving, shopping, community activity, and occupation  PERSONAL FACTORS: Education, Past/current experiences, and 3+ comorbidities: anxiety , depression and chronic pain  are also affecting patient's  functional outcome.  REHAB POTENTIAL: Fair due to   CLINICAL DECISION MAKING: Unstable/unpredictable  EVALUATION COMPLEXITY: High   GOALS: Goals reviewed with patient? Yes  SHORT TERM GOALS: Target date: 04/24/2023   Patient to report pain no greater than 2/10  Baseline:  Goal status: ongoing  2.  Patient will be independent with initial HEP  Baseline:  Goal status: met 9/17    LONG TERM GOALS: Target date:07/30/2023   Patient to report an overall improvement at 60% with home and work ADLs Baseline:  Goal status: revised  2.  Patient to be independent with advanced HEP  Baseline:  Goal status:ongoing  3.  Patient to be able to sleep through the night  Baseline:  Goal status: ongoing 4.  Patient to be able to return to work Baseline:  Goal status: met 10/15  5.  Neck pain disablity score to improve by 5 Baseline:  Goal status: met 10/15  6.  Patient to report PSFS score of 6 with doing housework for extended period of time Baseline:  Goal status: INITIAL 7.  Patient will report PSFS of 8 with driving kids to and from school New  8. PSFS of 6 with stamina to work a long shift new   PLAN:  PT FREQUENCY: 1x/week  PT DURATION: 10 weeks  PLANNED INTERVENTIONS: Therapeutic exercises, Therapeutic activity, Neuromuscular re-education, Balance training, Gait training, Patient/Family education, Self Care, Joint mobilization, Vestibular training, Canalith repositioning, Aquatic Therapy, Dry Needling, Electrical stimulation, Spinal mobilization, Cryotherapy, Moist heat, Taping, Vasopneumatic device, Traction, Ultrasound, Ionotophoresis 4mg /ml Dexamethasone, and Manual therapy  PLAN FOR NEXT SESSION:  steering wheels side/lateral wall planks; DN;  Nu-step, review HEP, progress postural strengthening including resistance training upper quarter  Lavinia Sharps, PT 06/24/23 10:16 AM Phone: [phone] Fax:  [phone]                                 OUTPATIENT PHYSICAL THERAPY FEMALE PELVIC EVALUATION   Patient Name: Adrienne Williams MRN: [id number] DOB:Jun 21, 1981, 42 y.o., female Today's Date: 06/24/2023  END OF SESSION:  PT End of Session - 06/24/23 0932     Visit Number 20    Date for PT Re-Evaluation 07/30/23    Authorization Type [employer]    PT Start Time 0930    PT Stop Time 1015    PT Time Calculation (min) 45 min    Activity Tolerance Patient tolerated treatment well    Behavior During Therapy [facility] for tasks assessed/performed               Past Medical History:  Diagnosis Date   Anxiety    Asthma    Depression    Family history of breast cancer    Frank breech presentation 05/29/2018   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   Hemorrhoids    Hx of varicella    PONV (postoperative nausea and vomiting)    "little nauseated"   Postpartum care following cesarean delivery (6/14) 01/18/2016   Past Surgical History:  Procedure Laterality Date   BREAST BIOPSY Right 02/25/2023   CESAREAN SECTION N/A 01/18/2016   Procedure: Primary CESAREAN SECTION;  Surgeon: Genia Del, MD;  Location: [facility];  Service: Obstetrics;  Laterality: N/A;  EDD: 01/25/16   CESAREAN SECTION N/A 05/29/2018   Procedure: Repeat CESAREAN SECTION;  Surgeon: Shea Evans, MD;  Location: [facility]TH[facility] SUIT[facility];  Service: Obstetrics;  Laterality: N/A;  EDD: 06/10/18  COLONOSCOPY     CYSTECTOMY  2002   pilonidal region   DIAGNOSTIC LAPAROSCOPY WITH REMOVAL OF ECTOPIC PREGNANCY Right 05/29/2020   Procedure: DIAGNOSTIC LAPAROSCOPY WITH REMOVAL OF ECTOPIC PREGNANCY;  Surgeon: Shea Evans, MD;  Location: [facility] OR;  Service: Gynecology;  Laterality: Right;   MOLE REMOVAL     UNILATERAL SALPINGECTOMY Right 05/29/2020   Procedure: UNILATERAL SALPINGECTOMY;  Surgeon: Shea Evans, MD;  Location: [facility] OR;  Service: Gynecology;  Laterality: Right;    WISDOM TOOTH EXTRACTION     Patient Active Problem List   Diagnosis Date Noted   Poor sleep 05/22/2023   Neck pain, chronic 06/27/2022   Fibromyalgia 06/27/2022   Myofascial pain 06/27/2022   Internal hemorrhoids 06/13/2022   Rectal bleeding 06/13/2022   Family history of breast cancer 01/18/2022   Monoallelic mutation of CHEK2 gene in female patient 01/18/2022   Anxiety and depression 05/31/2018   Rh negative, maternal 05/31/2018   Homero Fellers breech presentation 05/29/2018   Status post repeat low transverse cesarean section 05/29/2018   Postpartum care following cesarean delivery (10/24) 05/29/2018    PCP: Jarrett Soho, PA-C PCP - General  REFERRING PROVIDER: Vick Frees, MD Ref Provider  REFERRING DIAG: N39.3 (ICD-10-CM) - Stress incontinence (female) (female)  THERAPY DIAG:  Muscle weakness (generalized)  Other muscle spasm  Cramp and spasm  Unspecified lack of coordination  Cervicalgia  Pain in thoracic spine  Rationale for Evaluation and Treatment: Rehabilitation  ONSET DATE: 2019  SUBJECTIVE:   Pt reports that she has started swimming, she has been feeling stronger. Work was OK last several days, pain is not controlling her life anymore. Forgot her wand, almost brought it today.  Has been going to [place] more, has had more gas.  Has been pushing out her PF.        Pt reports that she is feeling better, she got out of bed for PT. Exercise is her stress reliever. When she is running, coughing and sneezing still gets her. Pt reports that she is always constipated.Has been constipated for a long time. She went horseback riding, got a massage, felt really good the other day.   Pt comes to therapy crying this morning, slept 3 hours, stated that husband had a breakdown and was not very nice to her. Reports that PT would help. Pt reports that she had an episode of leaking, she went  running. She was upset and went running.  Pain has not been nearly as  severe, she has been massaging her scars and it really helped. She had a massage as well.  She reports that she has an old knee injury and right LOWER EXTREMITY flares up sometimes.     Pt reports that she injured herself, seeing ortho, has had some SUI as well. Reports that she has had severe pain in her upper body, meditation helps. Reports that everything is tight. Privates and hamstrings are tight, Has had a lot of swelling as well - in her pelvis, abdominal as well. Leaks with running.  SUBJECTIVE STATEMENT:  Fluid intake: Yes: mostly water, seltzer, liquid IV    PAIN:  Are you having pain? Yes NPRS scale: 7/10 Pain location: External  Pain type: throbbing, tight, and tingling Pain description: constant, stabbing if super tight  Aggravating factors: walking or running, being on her feet, sitting, standing Relieving factors: meditation, heat, stretching, does not know the right stretches, if she tightens her core  PRECAUTIONS: None  RED FLAGS: None   WEIGHT BEARING RESTRICTIONS: No  FALLS:  Has patient fallen in last 6 months? No  LIVING ENVIRONMENT: Lives with: lives with their family Lives in: House/apartment Stairs: No Has following equipment at home: None  OCCUPATION: pharmacist- retail, very difficult right now  PLOF: Independent  PATIENT GOALS: to be able to relieve pain without having to spend a ton of time on it, has 2 young kids and a job. Pain is a priority. Rare that the leaking happens.   PERTINENT HISTORY:   Sexual abuse: No  BOWEL MOVEMENT: Pain with bowel movement: Yes when stuff is super tight Type of bowel movement:Strain Yes sometimes Fully empty rectum: Yes: when she goes Leakage: No only when the hemorrhoid is really bad, it was crazy, it' better now Pads:  No Fiber supplement: No  URINATION: Pain with urination: No Fully empty bladder: No sometimes feels like she struggles Stream: Weak Urgency: No, used to Frequency: not now Leakage: Coughing, Sneezing, and Exercise, running Pads: Yes:    INTERCOURSE: Pain with intercourse: Initial Penetration, During Penetration, After Intercourse, During Climax, and Pain Interrupts Intercourse Ability to have vaginal penetration:  Yes: has not in a while Climax: yes Marinoff Scale: 2/3  PREGNANCY: Vaginal deliveries 0 Tearing No C-section deliveries 2 Currently pregnant No  PROLAPSE: Check with internal   OBJECTIVE:  Note: Objective measures were completed at Evaluation unless otherwise noted. Very tight and tender abdominal scars Pt irritable Guarded gait Guarded and somewhat anxious     COGNITION: Overall cognitive status: Within functional limits for tasks assessed     SENSATION: Light touch: Appears intact Proprioception: Appears intact  MUSCLE LENGTH: Hamstrings: tight bilateral Thomas test: Right tight ; Left tight    POSTURE: rounded shoulders and forward head  PELVIC ALIGNMENT:  LUMBARAROM/PROM:  A/PROM A/PROM  eval  Flexion limited  Extension limited  Right lateral flexion limited  Left lateral flexion   Right rotation   Left rotation    (Blank rows = not tested)  LOWER EXTREMITY ROM:  Passive ROM Right eval Left eval  Hip flexion limited limited  Hip extension    Hip abduction limited limited  Hip adduction    Hip internal rotation    Hip external rotation    Knee flexion    Knee extension    Ankle dorsiflexion    Ankle plantarflexion    Ankle inversion    Ankle eversion     (Blank rows = not tested)  LOWER EXTREMITY MMT: 4/5 grossly overall  PALPATION:   General  upper chest breathing and restrictions throughout abdomen                External Perineal Exam wfl                             Internal Pelvic Floor decreased strength,  high tone, tight and tender throughout Patient confirms identification and approves PT to assess internal pelvic floor and treatment Yes  PELVIC MMT:   MMT eval  Vaginal  2/5  Internal Anal Sphincter 5/5  External Anal Sphincter 5/5  Puborectalis   Diastasis Recti Yes- 2 fingers above umbilicus  (Blank rows = not tested)        TONE: high  PROLAPSE: no TODAY'S TREATMENT:                                                                                                                              DATE:   06/24/23   Manual- umbilical scar release with deep breathing Lumbar paraspinals dry needling with e-stim    Neuro reed- child's pose, diaphragmatic breathing, reverse kegel, open books bilateral, pigeon      Manual- rectal pelvic floor muscle release- muscle energy Trial of dry needling bilateral rectus abdominis  Therapeutic exercises-child's pose, reverse kegel/ DB There acts- education on constipation management, wand, exercise for stress reduction   Manual- internal pelvic floor muscles assessment, perineal massage performed and patient educated on   Therapeutic activities- education on lubricant, wand, practiced diaphragmatic breathing with thera band reed, reverse kegel on ball, rolled up towel   PATIENT EDUCATION:  Education details/ manual performed: scar massage and cupping Person educated: Patient Education method: Programmer, multimedia, Demonstration, and Handouts Education comprehension: verbalized understanding, returned demonstration, verbal cues required, tactile cues required, and needs further education  Trigger Point Dry-Needling  Treatment instructions: Expect mild to moderate muscle soreness. S/S of pneumothorax if dry needled over a lung field, and to seek immediate medical attention should they occur. Patient verbalized understanding of these instructions and education.  Patient Consent Given: Yes Education handout provided: Previously  provided Muscles treated: lumbar paraspinals, umbilical scar Treatment response/outcome: Utilized skilled palpation to identify trigger points.  During dry needling able to palpate muscle twitch and muscle elongation   Skilled palpation and monitoring by PT during dry needling   HOME EXERCISE PROGRAM:   [id number]  ASSESSMENT:  CLINICAL IMPRESSION: Pt with tight and tender umbilical scar after ectopic pregnancy, did well with scar release but had some increased TPs lumbar paraspinals. Able to demonstrate improved diaphragmatic breathing and pelvic floor lengthening- reverse kegel now. Has had decreased pain. Liked dry needling with TENS ( [facility])      Pt with high tone and tender points rectally, some in her bilateral  rectus abdominis. Continued down training of pelvic floor. Pt seems to be doing better overall, still holding tension in her pelvic floor. Discussed trial of DN pelvic floor. Added abdominal massage to her HEP to help reduce constipation.    Pt with high tone pelvic floor vaginally, decreased strength d/t tightness. Tenderness and tightness in superficial pelvic floor muscles. Will benefit from down training to reduce pain with intercourse and improve SUI with running. Abdominal scars with much less tenderness today. Pt seems to be progressing well.     Patient is a 42 y.o. F who was seen today for physical therapy evaluation and treatment for stress urinary incontinence. At this point her priority is treating her  abdominal and pelvic pain, she reported that she has a lot of irritation post intercourse as well and pain and tightness throughout her abdomen, low back and hips. She did well today with treatment and manual therapy on her tight and tender abdominal scars and stretches. Has limited PROM and guarded movement throughout her low back and hips as well as diastasis rectus. Patient will benefit from PT to reduce pain and urinary incontinence and improve  QOL.  OBJECTIVE IMPAIRMENTS: decreased activity tolerance, decreased coordination, decreased endurance, decreased mobility, decreased ROM, decreased strength, increased fascial restrictions, increased muscle spasms, impaired flexibility, and pain.   ACTIVITY LIMITATIONS: bending  PARTICIPATION LIMITATIONS: interpersonal relationship, occupation, and yard work  PERSONAL FACTORS: Time since onset of injury/illness/exacerbation are also affecting patient's functional outcome.   REHAB POTENTIAL: Good  CLINICAL DECISION MAKING: Stable/uncomplicated  EVALUATION COMPLEXITY: Low   GOALS: Goals reviewed with patient? Yes  SHORT TERM GOALS: Target date: 06/10/2023    Pt will be independent with HEP.   Baseline: Goal status: INITIAL  2.  Pt will be able to run/workout for 30 minutes without leakage or discomfort  Baseline:  Goal status: INITIAL  3.  Pt will be independent with diaphragmatic breathing and down training activities in order to improve pelvic floor relaxation.  Baseline:  Goal status: INITIAL   LONG TERM GOALS: Target date: 08/05/2023    Pt will be independent with advanced HEP.   Baseline:  Goal status: INITIAL  2.  Pt will report decreased pelvic pain to max 3/10  Baseline:  Goal status: INITIAL  3.  Pt will report reduced pain abdominal scars to max 3/10 Baseline:  Goal status: INITIAL  4.  Pt will soak 0 pads/ day Baseline:  Goal status: INITIAL   PLAN:  PT FREQUENCY: 1-2x/week  PT DURATION: 12 weeks  PLANNED INTERVENTIONS: Therapeutic exercises, Therapeutic activity, Neuromuscular re-education, Balance training, Gait training, Patient/Family education, Self Care, Joint mobilization, Dry Needling, Electrical stimulation, Spinal manipulation, Spinal mobilization, scar mobilization, Biofeedback, and Manual therapy  PLAN FOR NEXT SESSION: DN adductors and lutes and PF Aleesia Henney, PT 06/24/23 10:16 AM

## 2023-06-24 NOTE — Therapy (Deleted)
OUTPATIENT PHYSICAL THERAPY CERVICAL TREATMENT NOTE   Patient Name: Adrienne Williams MRN: [id number] DOB:04/07/1981, 42 y.o., female Today's Date: 06/24/2023  END OF SESSION:  PT End of Session - 06/24/23 0932     Visit Number 20    Date for PT Re-Evaluation 07/30/23    Authorization Type [employer]               Past Medical History:  Diagnosis Date   Anxiety    Asthma    Depression    Family history of breast cancer    Homero Fellers breech presentation 05/29/2018   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   Hemorrhoids    Hx of varicella    PONV (postoperative nausea and vomiting)    "little nauseated"   Postpartum care following cesarean delivery (6/14) 01/18/2016   Past Surgical History:  Procedure Laterality Date   BREAST BIOPSY Right 02/25/2023   CESAREAN SECTION N/A 01/18/2016   Procedure: Primary CESAREAN SECTION;  Surgeon: Genia Del, MD;  Location: [facility];  Service: Obstetrics;  Laterality: N/A;  EDD: 01/25/16   CESAREAN SECTION N/A 05/29/2018   Procedure: Repeat CESAREAN SECTION;  Surgeon: Shea Evans, MD;  Location: [facility];  Service: Obstetrics;  Laterality: N/A;  EDD: 06/10/18   COLONOSCOPY     CYSTECTOMY  2002   pilonidal region   DIAGNOSTIC LAPAROSCOPY WITH REMOVAL OF ECTOPIC PREGNANCY Right 05/29/2020   Procedure: DIAGNOSTIC LAPAROSCOPY WITH REMOVAL OF ECTOPIC PREGNANCY;  Surgeon: Shea Evans, MD;  Location: [facility] OR;  Service: Gynecology;  Laterality: Right;   MOLE REMOVAL     UNILATERAL SALPINGECTOMY Right 05/29/2020   Procedure: UNILATERAL SALPINGECTOMY;  Surgeon: Shea Evans, MD;  Location: [facility] OR;  Service: Gynecology;  Laterality: Right;   WISDOM TOOTH EXTRACTION     Patient Active Problem List   Diagnosis Date Noted   Poor sleep 05/22/2023   Neck pain, chronic 06/27/2022   Fibromyalgia 06/27/2022   Myofascial pain 06/27/2022   Internal hemorrhoids 06/13/2022   Rectal bleeding 06/13/2022    Family history of breast cancer 01/18/2022   Monoallelic mutation of CHEK2 gene in female patient 01/18/2022   Anxiety and depression 05/31/2018   Rh negative, maternal 05/31/2018   Homero Fellers breech presentation 05/29/2018   Status post repeat low transverse cesarean section 05/29/2018   Postpartum care following cesarean delivery (10/24) 05/29/2018    PCP: Jarrett Soho, PA-C   REFERRING PROVIDER: Jackelyn Poling, DO  REFERRING DIAG: [phone] (ICD-10-CM) - Other muscle spasm  THERAPY DIAG:  Muscle weakness (generalized)  Other muscle spasm  Cramp and spasm  Unspecified lack of coordination  Cervicalgia  Pain in thoracic spine  Rationale for Evaluation and Treatment: Rehabilitation  ONSET DATE: 03/26/2023  SUBJECTIVE:  SUBJECTIVE STATEMENT: I started swimming.  It's going well.  I wanted to talk to you about what I'm working there.  Weekend chest tightness. I only got 4 1/2 hours of sleep.   EVAL: Patient reports she was helping her husband install garage seal on 03-10-23.  She felt severe pain in thoracic spine and neck onset around 03-13-23 and explains that she noticed that she had a lot of swelling and had to get a bigger bra and extender for her bra due to the swelling.  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  Difficulty with driving, sleeping, IADL's self care and working.  I have two kids and caring for them and the dogs has been very challenging.  I am usually very active but unable to do anything right now.  Hand dominance: Right  PERTINENT HISTORY:  na  PAIN:  PAIN:  Are you having pain? Yes NPRS scale:  3-4/10 Pain location: chest, low shoulder blades Aggravating factors: stress; overstretching Relieving factors: DN    PRECAUTIONS: None  RED FLAGS: None     WEIGHT BEARING RESTRICTIONS: No  FALLS:  Has patient fallen in last 6 months? No  LIVING ENVIRONMENT: Lives with: lives with their family and lives with their spouse Lives in: House/apartment   OCCUPATION: Pharmacist  PLOF: Independent, Independent with basic ADLs, Independent with household mobility without device, Independent with community mobility without device, Independent with homemaking with ambulation, Independent with gait, and Independent with transfers  PATIENT GOALS: to eliminate pain and be able to work and do my usual daily activities and sleep without interruption The Patient-Specific Functional Scale  Initial:  I am going to ask you to identify up to 3 important activities that you are unable to do or are having difficulty with as a result of this problem.  Today are there any activities that you are unable to do or having difficulty with because of this?  (Patient shown scale and patient rated each activity)  Follow up: When you first came in you had difficulty performing these activities.  Today do you still have difficulty?  Patient-Specific activity scoring scheme (Point to one number):  0 1 2 3 4 5 6 7 8 9  10 Unable                                                                                                          Able to perform To perform                                                                                                    activity  at the same Activity         Level as before                                                                                                                       Injury or problem  Activity       Stamina to work all day                                                                       10/15     5                   2.             Driving the kids to school and picking up                                    10/15            6                      3.             Doing extended housework                                                            10/15                4   4. Able to push the cart at [employer]    NEXT MD VISIT: 04/10/23  OBJECTIVE:   DIAGNOSTIC FINDINGS:  MRI showed small lesion 4 mm breast and had biopsy in July; normal but recommended another MRI and biopsy   PATIENT SURVEYS:  NDI 80% self disability score  NDI 10/15:  54%  COGNITION: Overall cognitive status: Within functional limits for tasks assessed  SENSATION: WFL  POSTURE: rounded shoulders  PALPATION: Tight bands and trigger points in R > L upper traps and rhomboids   CERVICAL ROM:   Active ROM A/PROM (deg) eval 10/15  Flexion WNL WNL  Extension WNL WNL  Right lateral flexion WNL WNL  Left lateral flexion WNL WNL  Right rotation WNL WNL  Left rotation WNL WNL   (Blank rows = not tested)   UPPER EXTREMITY MMT: 5/5 throughout bilateral upper extremities but patient states that she "is going to be so sore" from the muscle testing throughout the test.   TODAY'S TREATMENT:   1/12  Nu-Step L5  ( green machine) 5 min Discussion of swimming and benefits for strengthening upper back [facility] cable row 15# 12x Yellow band 90/90 external rotation and press overhead 5x, then 2x more Standing 30# lat pulls 10x; 35# 5x Green band single arm pull with opp hip flexion 10x right/left  Manual therapy to bil upper traps, neck musculature Trigger Point Dry-Needling  Treatment instructions: Expect mild to moderate muscle soreness. S/S of pneumothorax if dry needled over a lung field, and to seek immediate medical attention should they occur. Patient verbalized understanding of these instructions and education.  Patient Consent Given: Yes Education handout provided: Previously provided Muscles treated:  bil upper traps; right SCM; bil pectorals, bil subscapularis  Electrical stimulation performed: No Parameters: N/A Treatment  response/outcome: dec tender point size/number, twitch response and decreased muscle tension   10/29 Nu-Step L3  ( blue machine) 5 min Seated peanut ball to lengthen trunk musculature 5x right/left Sidelying pelvic oscillations using peanut ball Open books with peanut ball 10x  Recorded pt doing the peanut ball ex's with her phone since they are not on [employer] (pt interested in getting a peanut for home) Standing cable row 15# 10x Standing 30# lat pulls 15x   Manual therapy to bil upper traps, scapular musculature Trigger Point Dry-Needling  Treatment instructions: Expect mild to moderate muscle soreness. S/S of pneumothorax if dry needled over a lung field, and to seek immediate medical attention should they occur. Patient verbalized understanding of these instructions and education.  Patient Consent Given: Yes Education handout provided: Previously provided Muscles treated: bil teres, bil lats; bil upper traps and levator scap muscles Electrical stimulation performed: No Parameters: N/A Treatment response/outcome: dec tender point size/number, twitch response and decreased muscle tension  Heat 10 min     10/22 Nu-Step L3  ( blue machine) 5 min Standing cable row 15# 8x Wall lift offs with green loop 4x  (very challenging) Squats with 10# KB 10x Farmer's carry hold with BOSU alternating step taps 10x right/left sides Standing 30# lat pulls 12x   Manual therapy to bil upper traps cervical musculature Trigger Point Dry-Needling  Treatment instructions: Expect mild to moderate muscle soreness. S/S of pneumothorax if dry needled over a lung field, and to seek immediate medical attention should they occur. Patient verbalized understanding of these instructions and education.  Patient Consent Given: Yes Education handout provided: Previously provided Muscles treated: bil teres, bil lats; bil upper trap (ant and post approach); bil pecs Electrical stimulation performed:  No Parameters: N/A Treatment response/outcome: dec tender point size/number, twitch response and decreased muscle tension  Heat 2 min            PATIENT EDUCATION:  Education details:  HEP Person educated: Patient Education method: Programmer, multimedia, Facilities manager, Verbal cues, and Handouts Education comprehension: verbalized understanding, returned demonstration, and verbal cues required  HOME EXERCISE PROGRAM: Access Code: [email or web address] URL: [email or web address] Date: 05/14/2023 Prepared by: Lavinia Sharps  Exercises - Supine Chest Stretch on Foam Roll  - 1 x daily - 7 x weekly - 3 sets - 10 reps - Thoracic Extension Mobilization on Foam Roll  - 1 x daily - 7 x weekly - 3 sets - 10 reps - Supine Shoulder Horizontal Abduction with Resistance  - 1 x daily - 7 x weekly - 3 sets - 10 reps - Supine PNF D2 Flexion with Resistance  - 1 x daily - 7 x weekly - 3 sets - 10 reps -  Latissimus Dorsi Stretch at Wall  - 2 x daily - 7 x weekly - 1 sets - 2-3 reps - 20 hold - Prone Chest Stretch on Chair  - 1 x daily - 7 x weekly - 1 sets - 2-3 reps - 20 hold - Standing Thoracic Open Book at Wall  - 1 x daily - 7 x weekly - 1 sets - 5 reps - Standing Cervical Retraction  - 3-4 x daily - 7 x weekly - 1 sets - 5 reps - Wall Push Up  - 1 x daily - 7 x weekly - 1 sets - 10 reps - Wall Angels  - 1 x daily - 7 x weekly - 1 sets - 10 reps - Shoulder Overhead Press in Flexion with Dumbbells  - 1 x daily - 7 x weekly - 2 sets - 5 reps - Squat with Chair Touch  - 1 x daily - 7 x weekly - 2 sets - 5 reps - Half Deadlift with Kettlebell  - 1 x daily - 7 x weekly - 2 sets - 5 reps - Farmer's Carry with Kettlebells  - 1 x daily - 7 x weekly - 1 sets - 10 reps - Standing Hip Circles  - 1 x daily - 7 x weekly - 1 sets - 10 reps - Seated Assisted Cervical Rotation with Towel  - 1 x daily - 7 x weekly - 1 sets - 10 reps - Cervical Extension AROM with Strap  - 1 x daily - 7 x weekly - 1 sets - 10 reps - Supine  Diaphragmatic Breathing  - 1 x daily - 7 x weekly - 1 sets - 10 reps - Doorway Pec Stretch at 90 Degrees Abduction  - 1 x daily - 7 x weekly - 2 sets - 2 reps - 20 hold - Doorway Pec Stretch at 120 Degrees Abduction  - 1 x daily - 7 x weekly - 2 sets - 2 reps - 20 hold - Standing Quadratus Lumborum Stretch with Doorway  - 1 x daily - 7 x weekly - 2 sets - 2 reps - 20 hold - Standing Lat Pull Down with Resistance - Elbows Bent  - 1 x daily - 7 x weekly - 1 sets - 10 reps - Shoulder extension with resistance - Neutral  - 1 x daily - 7 x weekly - 1 sets - 10 reps - Standing Plank on Wall  - 1 x daily - 7 x weekly - 1 sets - 5 reps - 5 hold - Standing Full Side Plank on Wall  - 1 x daily - 7 x weekly - 1 sets - 3 reps - 5 hold -ASSESSMENT:  CLINICAL IMPRESSION: Able to continue with a strengthening progression emphasizing posterior chain muscles.  Therapist providing verbal cues to optimize technique with  exercises in order to achieve the greatest benefit. The patient benefits significantly from dry needling and manual therapy to stimulate underlying myofascial trigger points and muscular tissue for management of neuromusculoskeletal pain with much improved soft tissue mobility  following treatment session.      OBJECTIVE IMPAIRMENTS: increased fascial restrictions and increased muscle spasms.   ACTIVITY LIMITATIONS: carrying, lifting, bending, sitting, squatting, sleeping, transfers, bed mobility, bathing, dressing, and hygiene/grooming  PARTICIPATION LIMITATIONS: meal prep, cleaning, laundry, interpersonal relationship, driving, shopping, community activity, and occupation  PERSONAL FACTORS: Education, Past/current experiences, and 3+ comorbidities: anxiety , depression and chronic pain  are also affecting patient's functional outcome.   REHAB  POTENTIAL: Fair due to   CLINICAL DECISION MAKING: Unstable/unpredictable  EVALUATION COMPLEXITY: High   GOALS: Goals reviewed with patient?  Yes  SHORT TERM GOALS: Target date: 04/24/2023   Patient to report pain no greater than 2/10  Baseline:  Goal status: ongoing  2.  Patient will be independent with initial HEP  Baseline:  Goal status: met 9/17    LONG TERM GOALS: Target date:07/30/2023   Patient to report an overall improvement at 60% with home and work ADLs Baseline:  Goal status: revised  2.  Patient to be independent with advanced HEP  Baseline:  Goal status:ongoing  3.  Patient to be able to sleep through the night  Baseline:  Goal status: ongoing 4.  Patient to be able to return to work Baseline:  Goal status: met 10/15  5.  Neck pain disablity score to improve by 5 Baseline:  Goal status: met 10/15  6.  Patient to report PSFS score of 6 with doing housework for extended period of time Baseline:  Goal status: INITIAL 7.  Patient will report PSFS of 8 with driving kids to and from school New  8. PSFS of 6 with stamina to work a long shift new   PLAN:  PT FREQUENCY: 1x/week  PT DURATION: 10 weeks  PLANNED INTERVENTIONS: Therapeutic exercises, Therapeutic activity, Neuromuscular re-education, Balance training, Gait training, Patient/Family education, Self Care, Joint mobilization, Vestibular training, Canalith repositioning, Aquatic Therapy, Dry Needling, Electrical stimulation, Spinal mobilization, Cryotherapy, Moist heat, Taping, Vasopneumatic device, Traction, Ultrasound, Ionotophoresis 4mg /ml Dexamethasone, and Manual therapy  PLAN FOR NEXT SESSION:  steering wheels side/lateral wall planks; DN;  Nu-step, review HEP, progress postural strengthening including resistance training upper quarter  Lavinia Sharps, PT 06/24/23 9:33 AM Phone: [phone] Fax: [phone]                                 OUTPATIENT PHYSICAL THERAPY FEMALE PELVIC EVALUATION   Patient Name: Adrienne Williams MRN: [id number] DOB:04-13-1981, 42 y.o., female Today's Date:  06/24/2023  END OF SESSION:  PT End of Session - 06/24/23 0932     Visit Number 20    Date for PT Re-Evaluation 07/30/23    Authorization Type [employer]               Past Medical History:  Diagnosis Date   Anxiety    Asthma    Depression    Family history of breast cancer    Frank breech presentation 05/29/2018   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   Hemorrhoids    Hx of varicella    PONV (postoperative nausea and vomiting)    "little nauseated"   Postpartum care following cesarean delivery (6/14) 01/18/2016   Past Surgical History:  Procedure Laterality Date   BREAST BIOPSY Right 02/25/2023   CESAREAN SECTION N/A 01/18/2016   Procedure: Primary CESAREAN SECTION;  Surgeon: Genia Del, MD;  Location: [facility];  Service: Obstetrics;  Laterality: N/A;  EDD: 01/25/16   CESAREAN SECTION N/A 05/29/2018   Procedure: Repeat CESAREAN SECTION;  Surgeon: Shea Evans, MD;  Location: [facility];  Service: Obstetrics;  Laterality: N/A;  EDD: 06/10/18   COLONOSCOPY     CYSTECTOMY  2002   pilonidal region   DIAGNOSTIC LAPAROSCOPY WITH REMOVAL OF ECTOPIC PREGNANCY Right 05/29/2020   Procedure: DIAGNOSTIC LAPAROSCOPY WITH REMOVAL OF ECTOPIC PREGNANCY;  Surgeon: Shea Evans, MD;  Location: [facility] OR;  Service: Gynecology;  Laterality: Right;   MOLE REMOVAL     UNILATERAL SALPINGECTOMY Right 05/29/2020   Procedure: UNILATERAL SALPINGECTOMY;  Surgeon: Shea Evans, MD;  Location: [facility] OR;  Service: Gynecology;  Laterality: Right;   WISDOM TOOTH EXTRACTION     Patient Active Problem List   Diagnosis Date Noted   Poor sleep 05/22/2023   Neck pain, chronic 06/27/2022   Fibromyalgia 06/27/2022   Myofascial pain 06/27/2022   Internal hemorrhoids 06/13/2022   Rectal bleeding 06/13/2022   Family history of breast cancer 01/18/2022   Monoallelic mutation of CHEK2 gene in female patient 01/18/2022   Anxiety and depression 05/31/2018   Rh negative,  maternal 05/31/2018   Homero Fellers breech presentation 05/29/2018   Status post repeat low transverse cesarean section 05/29/2018   Postpartum care following cesarean delivery (10/24) 05/29/2018    PCP: Jarrett Soho, PA-C PCP - General  REFERRING PROVIDER: Vick Frees, MD Ref Provider  REFERRING DIAG: N39.3 (ICD-10-CM) - Stress incontinence (female) (female)  THERAPY DIAG:  Muscle weakness (generalized)  Other muscle spasm  Cramp and spasm  Unspecified lack of coordination  Cervicalgia  Pain in thoracic spine  Rationale for Evaluation and Treatment: Rehabilitation  ONSET DATE: 2019  SUBJECTIVE:    Pt reports that she is feeling better, she got out of bed for PT. Exercise is her stress reliever. When she is running, coughing and sneezing still gets her. Pt reports that she is always constipated.Has been constipated for a long time. She went horseback riding, got a massage, felt really good the other day.   Pt comes to therapy crying this morning, slept 3 hours, stated that husband had a breakdown and was not very nice to her. Reports that PT would help. Pt reports that she had an episode of leaking, she went  running. She was upset and went running.  Pain has not been nearly as severe, she has been massaging her scars and it really helped. She had a massage as well.  She reports that she has an old knee injury and right LOWER EXTREMITY flares up sometimes.     Pt reports that she injured herself, seeing ortho, has had some SUI as well. Reports that she has had severe pain in her upper body, meditation helps. Reports that everything is tight. Privates and hamstrings are tight, Has had a lot of swelling as well - in her pelvis, abdominal as well. Leaks with running.                                                                                                                                                                                      SUBJECTIVE  STATEMENT:  Fluid intake: Yes: mostly water, seltzer, liquid IV    PAIN:  Are you having pain? Yes NPRS scale: 7/10 Pain location: External  Pain type: throbbing, tight, and tingling Pain description: constant, stabbing if super tight  Aggravating factors: walking or running, being on her feet, sitting, standing Relieving factors: meditation, heat, stretching, does not know the right stretches, if she tightens her core  PRECAUTIONS: None  RED FLAGS: None   WEIGHT BEARING RESTRICTIONS: No  FALLS:  Has patient fallen in last 6 months? No  LIVING ENVIRONMENT: Lives with: lives with their family Lives in: House/apartment Stairs: No Has following equipment at home: None  OCCUPATION: pharmacist- retail, very difficult right now  PLOF: Independent  PATIENT GOALS: to be able to relieve pain without having to spend a ton of time on it, has 2 young kids and a job. Pain is a priority. Rare that the leaking happens.   PERTINENT HISTORY:   Sexual abuse: No  BOWEL MOVEMENT: Pain with bowel movement: Yes when stuff is super tight Type of bowel movement:Strain Yes sometimes Fully empty rectum: Yes: when she goes Leakage: No only when the hemorrhoid is really bad, it was crazy, it' better now Pads: No Fiber supplement: No  URINATION: Pain with urination: No Fully empty bladder: No sometimes feels like she struggles Stream: Weak Urgency: No, used to Frequency: not now Leakage: Coughing, Sneezing, and Exercise, running Pads: Yes:    INTERCOURSE: Pain with intercourse: Initial Penetration, During Penetration, After Intercourse, During Climax, and Pain Interrupts Intercourse Ability to have vaginal penetration:  Yes: has not in a while Climax: yes Marinoff Scale: 2/3  PREGNANCY: Vaginal deliveries 0 Tearing No C-section deliveries 2 Currently pregnant No  PROLAPSE: Check with internal   OBJECTIVE:  Note: Objective measures were completed at Evaluation unless  otherwise noted. Very tight and tender abdominal scars Pt irritable Guarded gait Guarded and somewhat anxious     COGNITION: Overall cognitive status: Within functional limits for tasks assessed     SENSATION: Light touch: Appears intact Proprioception: Appears intact  MUSCLE LENGTH: Hamstrings: tight bilateral Thomas test: Right tight ; Left tight    POSTURE: rounded shoulders and forward head  PELVIC ALIGNMENT:  LUMBARAROM/PROM:  A/PROM A/PROM  eval  Flexion limited  Extension limited  Right lateral flexion limited  Left lateral flexion   Right rotation   Left rotation    (Blank rows = not tested)  LOWER EXTREMITY ROM:  Passive ROM Right eval Left eval  Hip flexion limited limited  Hip extension    Hip abduction limited limited  Hip adduction    Hip internal rotation    Hip external rotation    Knee flexion    Knee extension    Ankle dorsiflexion    Ankle plantarflexion    Ankle inversion    Ankle eversion     (Blank rows = not tested)  LOWER EXTREMITY MMT: 4/5 grossly overall  PALPATION:   General  upper chest breathing and restrictions throughout abdomen                External Perineal Exam wfl                             Internal Pelvic Floor decreased strength, high tone, tight and tender throughout Patient confirms identification and approves PT to assess internal pelvic floor and treatment Yes  PELVIC MMT:   MMT eval  Vaginal 2/5  Internal Anal Sphincter 5/5  External Anal Sphincter 5/5  Puborectalis   Diastasis Recti Yes- 2 fingers above umbilicus  (Blank rows = not tested)        TONE: high  PROLAPSE: no TODAY'S TREATMENT:                                                                                                                              DATE:    Manual- rectal pelvic floor muscle release- muscle energy Trial of dry needling bilateral rectus abdominis  Therapeutic exercises-child's pose, reverse kegel/ DB There  acts- education on constipation management, wand, exercise for stress reduction   Manual- internal pelvic floor muscles assessment, perineal massage performed and patient educated on   Therapeutic activities- education on lubricant, wand, practiced diaphragmatic breathing with thera band reed, reverse kegel on ball, rolled up towel   PATIENT EDUCATION:  Education details/ manual performed: scar massage and cupping Person educated: Patient Education method: Programmer, multimedia, Demonstration, and Handouts Education comprehension: verbalized understanding, returned demonstration, verbal cues required, tactile cues required, and needs further education  HOME EXERCISE PROGRAM:   [email or web address]  ASSESSMENT:  CLINICAL IMPRESSION: Pt with high tone and tender points rectally, some in her bilateral rectus abdominis. Continued down training of pelvic floor. Pt seems to be doing better overall, still holding tension in her pelvic floor. Discussed trial of DN pelvic floor. Added abdominal massage to her HEP to help reduce constipation.    Pt with high tone pelvic floor vaginally, decreased strength d/t tightness. Tenderness and tightness in superficial pelvic floor muscles. Will benefit from down training to reduce pain with intercourse and improve SUI with running. Abdominal scars with much less tenderness today. Pt seems to be progressing well.     Patient is a 42 y.o. F who was seen today for physical therapy evaluation and treatment for stress urinary incontinence. At this point her priority is treating her abdominal and pelvic pain, she reported that she has a lot of irritation post intercourse as well and pain and tightness throughout her abdomen, low back and hips. She did well today with treatment and manual therapy on her tight and tender abdominal scars and stretches. Has limited PROM and guarded movement throughout her low back and hips as well as diastasis rectus. Patient will benefit from PT to  reduce pain and urinary incontinence and improve QOL.  OBJECTIVE IMPAIRMENTS: decreased activity tolerance, decreased coordination, decreased endurance, decreased mobility, decreased ROM, decreased strength, increased fascial restrictions, increased muscle spasms, impaired flexibility, and pain.   ACTIVITY LIMITATIONS: bending  PARTICIPATION LIMITATIONS: interpersonal relationship, occupation, and yard work  PERSONAL FACTORS: Time since onset of injury/illness/exacerbation are also affecting patient's functional outcome.   REHAB POTENTIAL: Good  CLINICAL DECISION MAKING: Stable/uncomplicated  EVALUATION COMPLEXITY: Low   GOALS: Goals reviewed with patient? Yes  SHORT TERM GOALS: Target date: 06/10/2023    Pt will be independent with HEP.   Baseline: Goal status: INITIAL  2.  Pt will be able to run/workout for 30 minutes without leakage or discomfort  Baseline:  Goal status: INITIAL  3.  Pt will be independent with diaphragmatic breathing and down training activities in order to improve pelvic floor relaxation.  Baseline:  Goal status: INITIAL   LONG TERM GOALS: Target date: 08/05/2023    Pt will be independent with advanced HEP.   Baseline:  Goal status: INITIAL  2.  Pt will report decreased pelvic pain to max 3/10  Baseline:  Goal status: INITIAL  3.  Pt will report reduced pain abdominal scars to max 3/10 Baseline:  Goal status: INITIAL  4.  Pt will soak 0 pads/ day Baseline:  Goal status: INITIAL   PLAN:  PT FREQUENCY: 1-2x/week  PT DURATION: 12 weeks  PLANNED INTERVENTIONS: Therapeutic exercises, Therapeutic activity, Neuromuscular re-education, Balance training, Gait training, Patient/Family education, Self Care, Joint mobilization, Dry Needling, Electrical stimulation, Spinal manipulation, Spinal mobilization, scar mobilization, Biofeedback, and Manual therapy  PLAN FOR NEXT SESSION: internal pelvic floor muscle assessment and treatment  Tamitha Norell, PT 06/24/23 9:33 AM

## 2023-06-28 ENCOUNTER — Ambulatory Visit: Payer: Managed Care, Other (non HMO) | Admitting: Physical Therapy

## 2023-06-28 DIAGNOSIS — M6281 Muscle weakness (generalized): Secondary | ICD-10-CM

## 2023-06-28 DIAGNOSIS — R252 Cramp and spasm: Secondary | ICD-10-CM

## 2023-06-28 DIAGNOSIS — M542 Cervicalgia: Secondary | ICD-10-CM

## 2023-06-28 NOTE — Therapy (Signed)
OUTPATIENT PHYSICAL THERAPY CERVICAL TREATMENT NOTE   Patient Name: Adrienne Williams MRN: [id number] DOB:07-Feb-1981, 42 y.o., female Today's Date: 06/28/2023  END OF SESSION:  PT End of Session - 06/28/23 0844     Visit Number 19    Date for PT Re-Evaluation 07/30/23    Authorization Type [employer]    PT Start Time 0845    PT Stop Time 0930    PT Time Calculation (min) 45 min    Activity Tolerance Patient tolerated treatment well              Past Medical History:  Diagnosis Date   Anxiety    Asthma    Depression    Family history of breast cancer    Homero Fellers breech presentation 05/29/2018   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   Hemorrhoids    Hx of varicella    PONV (postoperative nausea and vomiting)    "little nauseated"   Postpartum care following cesarean delivery (6/14) 01/18/2016   Past Surgical History:  Procedure Laterality Date   BREAST BIOPSY Right 02/25/2023   CESAREAN SECTION N/A 01/18/2016   Procedure: Primary CESAREAN SECTION;  Surgeon: Genia Del, MD;  Location: [facility];  Service: Obstetrics;  Laterality: N/A;  EDD: 01/25/16   CESAREAN SECTION N/A 05/29/2018   Procedure: Repeat CESAREAN SECTION;  Surgeon: Shea Evans, MD;  Location: [facility];  Service: Obstetrics;  Laterality: N/A;  EDD: 06/10/18   COLONOSCOPY     CYSTECTOMY  2002   pilonidal region   DIAGNOSTIC LAPAROSCOPY WITH REMOVAL OF ECTOPIC PREGNANCY Right 05/29/2020   Procedure: DIAGNOSTIC LAPAROSCOPY WITH REMOVAL OF ECTOPIC PREGNANCY;  Surgeon: Shea Evans, MD;  Location: [facility] OR;  Service: Gynecology;  Laterality: Right;   MOLE REMOVAL     UNILATERAL SALPINGECTOMY Right 05/29/2020   Procedure: UNILATERAL SALPINGECTOMY;  Surgeon: Shea Evans, MD;  Location: [facility] OR;  Service: Gynecology;  Laterality: Right;   WISDOM TOOTH EXTRACTION     Patient Active Problem List   Diagnosis Date Noted   Poor sleep 05/22/2023   Neck pain, chronic  06/27/2022   Fibromyalgia 06/27/2022   Myofascial pain 06/27/2022   Internal hemorrhoids 06/13/2022   Rectal bleeding 06/13/2022   Family history of breast cancer 01/18/2022   Monoallelic mutation of CHEK2 gene in female patient 01/18/2022   Anxiety and depression 05/31/2018   Rh negative, maternal 05/31/2018   Homero Fellers breech presentation 05/29/2018   Status post repeat low transverse cesarean section 05/29/2018   Postpartum care following cesarean delivery (10/24) 05/29/2018    PCP: Jarrett Soho, PA-C   REFERRING PROVIDER: Jackelyn Poling, DO  REFERRING DIAG: [phone] (ICD-10-CM) - Other muscle spasm  THERAPY DIAG:  Muscle weakness (generalized)  Cervicalgia  Cramp and spasm  Rationale for Evaluation and Treatment: Rehabilitation  ONSET DATE: 03/26/2023  SUBJECTIVE:  SUBJECTIVE STATEMENT: Lots of stress.  Swimming when able to (sometimes hard to get in with work schedule). Today is OK. Had to be on the phone this morning which is not good.     EVAL: Patient reports she was helping her husband install garage seal on 03-10-23.  She felt severe pain in thoracic spine and neck onset around 03-13-23 and explains that she noticed that she had a lot of swelling and had to get a bigger bra and extender for her bra due to the swelling.  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  Difficulty with driving, sleeping, IADL's self care and working.  I have two kids and caring for them and the dogs has been very challenging.  I am usually very active but unable to do anything right now.  Hand dominance: Right  PERTINENT HISTORY:  na  PAIN:  PAIN:  Are you having pain? Yes NPRS scale:  3-4/10 kind of widespread (hips to top of the head) Pain location:  chest, low shoulder blades Aggravating factors: stress; overstretching Relieving factors: DN   PRECAUTIONS: None  RED FLAGS: None     WEIGHT BEARING RESTRICTIONS: No  FALLS:  Has patient fallen in last 6 months? No  LIVING ENVIRONMENT: Lives with: lives with their family and lives with their spouse Lives in: House/apartment   OCCUPATION: Pharmacist  PLOF: Independent, Independent with basic ADLs, Independent with household mobility without device, Independent with community mobility without device, Independent with homemaking with ambulation, Independent with gait, and Independent with transfers  PATIENT GOALS: to eliminate pain and be able to work and do my usual daily activities and sleep without interruption The Patient-Specific Functional Scale  Initial:  I am going to ask you to identify up to 3 important activities that you are unable to do or are having difficulty with as a result of this problem.  Today are there any activities that you are unable to do or having difficulty with because of this?  (Patient shown scale and patient rated each activity)  Follow up: When you first came in you had difficulty performing these activities.  Today do you still have difficulty?  Patient-Specific activity scoring scheme (Point to one number):  0 1 2 3 4 5 6 7 8 9  10 Unable                                                                                                          Able to perform To perform  activity at the same Activity         Level as before                                                                                                                       Injury or problem  Activity       Stamina to work all day                                                                       10/15     5                   2.             Driving the kids to school and picking  up                                    10/15            6                      3.            Doing extended housework                                                            10/15                4   4. Able to push the cart at [employer]    NEXT MD VISIT: 04/10/23  OBJECTIVE:   DIAGNOSTIC FINDINGS:  MRI showed small lesion 4 mm breast and had biopsy in July; normal but recommended another MRI and biopsy   PATIENT SURVEYS:  NDI 80% self disability score  NDI 10/15:  54%  COGNITION: Overall cognitive status: Within functional limits for tasks assessed  SENSATION: WFL  POSTURE: rounded shoulders  PALPATION: Tight bands and trigger points in R > L upper traps and rhomboids   CERVICAL ROM:   Active ROM A/PROM (deg) eval 10/15  Flexion WNL WNL  Extension WNL WNL  Right lateral flexion WNL WNL  Left lateral flexion WNL WNL  Right rotation WNL WNL  Left rotation WNL WNL   (Blank rows = not tested)   UPPER EXTREMITY MMT: 5/5 throughout bilateral upper extremities but patient states that she "is going to be so sore" from the muscle testing throughout the test.   TODAY'S TREATMENT:  11/22 Nu-Step L3 ( blue machine) 5 min Standing cable row 15# 12x (cue for abdominal draw in and exhale on the pull) Overhead press 2# alternating 10x Standing 30# lat pulls 10x  Planks on forearms on mat table 10 sec hold 3x Green loop steering wheel "drivers" 16X Green band single arm pull with opp hip flexion 10x right/left  Manual therapy to bil upper traps, periscapular musculature Trigger Point Dry-Needling  Treatment instructions: Expect mild to moderate muscle soreness. S/S of pneumothorax if dry needled over a lung field, and to seek immediate medical attention should they occur. Patient verbalized understanding of these instructions and education.  Patient Consent Given: Yes Education handout provided: Previously provided Muscles treated:  bil upper traps;bil teres, bil levator  scap; right rhomboids; right subscapularis  Electrical stimulation performed: No Parameters: N/A Treatment response/outcome: dec tender point size/number, twitch response and decreased muscle tension  11/12 Nu-Step L5  ( green machine) 5 min Discussion of swimming and benefits for strengthening upper back [facility] Standing cable row 15# 12x Yellow band 90/90 external rotation and press overhead 5x, then 2x more Standing 30# lat pulls 10x; 35# 5x Green band single arm pull with opp hip flexion 10x right/left  Manual therapy to bil upper traps, neck musculature Trigger Point Dry-Needling  Treatment instructions: Expect mild to moderate muscle soreness. S/S of pneumothorax if dry needled over a lung field, and to seek immediate medical attention should they occur. Patient verbalized understanding of these instructions and education.  Patient Consent Given: Yes Education handout provided: Previously provided Muscles treated:  bil upper traps; right SCM; bil pectorals, bil subscapularis  Electrical stimulation performed: No Parameters: N/A Treatment response/outcome: dec tender point size/number, twitch response and decreased muscle tension   10/29 Nu-Step L3  ( blue machine) 5 min Seated peanut ball to lengthen trunk musculature 5x right/left Sidelying pelvic oscillations using peanut ball Open books with peanut ball 10x  Recorded pt doing the peanut ball ex's with her phone since they are not on [employer] (pt interested in getting a peanut for home) Standing cable row 15# 10x Standing 30# lat pulls 15x   Manual therapy to bil upper traps, scapular musculature Trigger Point Dry-Needling  Treatment instructions: Expect mild to moderate muscle soreness. S/S of pneumothorax if dry needled over a lung field, and to seek immediate medical attention should they occur. Patient verbalized understanding of these instructions and education.  Patient Consent Given:  Yes Education handout provided: Previously provided Muscles treated: bil teres, bil lats; bil upper traps and levator scap muscles Electrical stimulation performed: No Parameters: N/A Treatment response/outcome: dec tender point size/number, twitch response and decreased muscle tension  Heat 10 min          PATIENT EDUCATION:  Education details:  HEP Person educated: Patient Education method: Programmer, multimedia, Facilities manager, Verbal cues, and Handouts Education comprehension: verbalized understanding, returned demonstration, and verbal cues required  HOME EXERCISE PROGRAM: Access Code: [email or web address] URL: [email or web address] Date: 05/14/2023 Prepared by: Lavinia Sharps  Exercises - Supine Chest Stretch on Foam Roll  - 1 x daily - 7 x weekly - 3 sets - 10 reps - Thoracic Extension Mobilization on Foam Roll  - 1 x daily - 7 x weekly - 3 sets - 10 reps - Supine Shoulder Horizontal Abduction with Resistance  - 1 x daily - 7 x weekly - 3 sets - 10 reps - Supine PNF D2 Flexion with Resistance  - 1 x daily - 7 x weekly -  3 sets - 10 reps - Latissimus Dorsi Stretch at Wall  - 2 x daily - 7 x weekly - 1 sets - 2-3 reps - 20 hold - Prone Chest Stretch on Chair  - 1 x daily - 7 x weekly - 1 sets - 2-3 reps - 20 hold - Standing Thoracic Open Book at Wall  - 1 x daily - 7 x weekly - 1 sets - 5 reps - Standing Cervical Retraction  - 3-4 x daily - 7 x weekly - 1 sets - 5 reps - Wall Push Up  - 1 x daily - 7 x weekly - 1 sets - 10 reps - Wall Angels  - 1 x daily - 7 x weekly - 1 sets - 10 reps - Shoulder Overhead Press in Flexion with Dumbbells  - 1 x daily - 7 x weekly - 2 sets - 5 reps - Squat with Chair Touch  - 1 x daily - 7 x weekly - 2 sets - 5 reps - Half Deadlift with Kettlebell  - 1 x daily - 7 x weekly - 2 sets - 5 reps - Farmer's Carry with Kettlebells  - 1 x daily - 7 x weekly - 1 sets - 10 reps - Standing Hip Circles  - 1 x daily - 7 x weekly - 1 sets - 10 reps - Seated Assisted  Cervical Rotation with Towel  - 1 x daily - 7 x weekly - 1 sets - 10 reps - Cervical Extension AROM with Strap  - 1 x daily - 7 x weekly - 1 sets - 10 reps - Supine Diaphragmatic Breathing  - 1 x daily - 7 x weekly - 1 sets - 10 reps - Doorway Pec Stretch at 90 Degrees Abduction  - 1 x daily - 7 x weekly - 2 sets - 2 reps - 20 hold - Doorway Pec Stretch at 120 Degrees Abduction  - 1 x daily - 7 x weekly - 2 sets - 2 reps - 20 hold - Standing Quadratus Lumborum Stretch with Doorway  - 1 x daily - 7 x weekly - 2 sets - 2 reps - 20 hold - Standing Lat Pull Down with Resistance - Elbows Bent  - 1 x daily - 7 x weekly - 1 sets - 10 reps - Shoulder extension with resistance - Neutral  - 1 x daily - 7 x weekly - 1 sets - 10 reps - Standing Plank on Wall  - 1 x daily - 7 x weekly - 1 sets - 5 reps - 5 hold - Standing Full Side Plank on Wall  - 1 x daily - 7 x weekly - 1 sets - 3 reps - 5 hold -ASSESSMENT:  CLINICAL IMPRESSION: Cues for abdominal (transverse abdominus) activation during exercise and to coordinate breathing (exhale on exertion). The patient had numerous muscle twitches produced during dry needling on right side which is a good prognostic indicator for benefit as it is associated with positive neuromotor and nervous system changes.   Improved right scapular dissociation following DN and manual therapy.   OBJECTIVE IMPAIRMENTS: increased fascial restrictions and increased muscle spasms.   ACTIVITY LIMITATIONS: carrying, lifting, bending, sitting, squatting, sleeping, transfers, bed mobility, bathing, dressing, and hygiene/grooming  PARTICIPATION LIMITATIONS: meal prep, cleaning, laundry, interpersonal relationship, driving, shopping, community activity, and occupation  PERSONAL FACTORS: Education, Past/current experiences, and 3+ comorbidities: anxiety , depression and chronic pain  are also affecting patient's functional outcome.   REHAB POTENTIAL: Fair  due to   CLINICAL DECISION  MAKING: Unstable/unpredictable  EVALUATION COMPLEXITY: High   GOALS: Goals reviewed with patient? Yes  SHORT TERM GOALS: Target date: 04/24/2023   Patient to report pain no greater than 2/10  Baseline:  Goal status: ongoing  2.  Patient will be independent with initial HEP  Baseline:  Goal status: met 9/17    LONG TERM GOALS: Target date:07/30/2023   Patient to report an overall improvement at 60% with home and work ADLs Baseline:  Goal status: revised  2.  Patient to be independent with advanced HEP  Baseline:  Goal status:ongoing  3.  Patient to be able to sleep through the night  Baseline:  Goal status: ongoing 4.  Patient to be able to return to work Baseline:  Goal status: met 10/15  5.  Neck pain disablity score to improve by 5 Baseline:  Goal status: met 10/15  6.  Patient to report PSFS score of 6 with doing housework for extended period of time Baseline:  Goal status: INITIAL 7.  Patient will report PSFS of 8 with driving kids to and from school New  8. PSFS of 6 with stamina to work a long shift new   PLAN:  PT FREQUENCY: 1x/week  PT DURATION: 10 weeks  PLANNED INTERVENTIONS: Therapeutic exercises, Therapeutic activity, Neuromuscular re-education, Balance training, Gait training, Patient/Family education, Self Care, Joint mobilization, Vestibular training, Canalith repositioning, Aquatic Therapy, Dry Needling, Electrical stimulation, Spinal mobilization, Cryotherapy, Moist heat, Taping, Vasopneumatic device, Traction, Ultrasound, Ionotophoresis 4mg /ml Dexamethasone, and Manual therapy  PLAN FOR NEXT SESSION:  do NDI; steering wheels side/lateral wall planks; DN;  Nu-step, review HEP, progress postural strengthening including resistance training upper quarter

## 2023-07-02 ENCOUNTER — Ambulatory Visit: Payer: Managed Care, Other (non HMO) | Admitting: Physical Therapy

## 2023-07-02 DIAGNOSIS — R252 Cramp and spasm: Secondary | ICD-10-CM

## 2023-07-02 DIAGNOSIS — M6281 Muscle weakness (generalized): Secondary | ICD-10-CM | POA: Diagnosis not present

## 2023-07-02 DIAGNOSIS — M542 Cervicalgia: Secondary | ICD-10-CM

## 2023-07-02 NOTE — Therapy (Signed)
OUTPATIENT PHYSICAL THERAPY CERVICAL TREATMENT NOTE   Patient Name: Adrienne Williams MRN: [id number] DOB:05/16/1981, 42 y.o., female Today's Date: 07/02/2023  END OF SESSION:  PT End of Session - 07/02/23 0852     Visit Number 20    Date for PT Re-Evaluation 07/30/23    Authorization Type [employer]    PT Start Time 0848    PT Stop Time 0930    PT Time Calculation (min) 42 min    Activity Tolerance Patient tolerated treatment well              Past Medical History:  Diagnosis Date   Anxiety    Asthma    Depression    Family history of breast cancer    Frank breech presentation 05/29/2018   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   Hemorrhoids    Hx of varicella    PONV (postoperative nausea and vomiting)    "little nauseated"   Postpartum care following cesarean delivery (6/14) 01/18/2016   Past Surgical History:  Procedure Laterality Date   BREAST BIOPSY Right 02/25/2023   CESAREAN SECTION N/A 01/18/2016   Procedure: Primary CESAREAN SECTION;  Surgeon: Genia Del, MD;  Location: [facility];  Service: Obstetrics;  Laterality: N/A;  EDD: 01/25/16   CESAREAN SECTION N/A 05/29/2018   Procedure: Repeat CESAREAN SECTION;  Surgeon: Shea Evans, MD;  Location: [facility];  Service: Obstetrics;  Laterality: N/A;  EDD: 06/10/18   COLONOSCOPY     CYSTECTOMY  2002   pilonidal region   DIAGNOSTIC LAPAROSCOPY WITH REMOVAL OF ECTOPIC PREGNANCY Right 05/29/2020   Procedure: DIAGNOSTIC LAPAROSCOPY WITH REMOVAL OF ECTOPIC PREGNANCY;  Surgeon: Shea Evans, MD;  Location: [facility] OR;  Service: Gynecology;  Laterality: Right;   MOLE REMOVAL     UNILATERAL SALPINGECTOMY Right 05/29/2020   Procedure: UNILATERAL SALPINGECTOMY;  Surgeon: Shea Evans, MD;  Location: [facility] OR;  Service: Gynecology;  Laterality: Right;   WISDOM TOOTH EXTRACTION     Patient Active Problem List   Diagnosis Date Noted   Poor sleep 05/22/2023   Neck pain, chronic  06/27/2022   Fibromyalgia 06/27/2022   Myofascial pain 06/27/2022   Internal hemorrhoids 06/13/2022   Rectal bleeding 06/13/2022   Family history of breast cancer 01/18/2022   Monoallelic mutation of CHEK2 gene in female patient 01/18/2022   Anxiety and depression 05/31/2018   Rh negative, maternal 05/31/2018   Homero Fellers breech presentation 05/29/2018   Status post repeat low transverse cesarean section 05/29/2018   Postpartum care following cesarean delivery (10/24) 05/29/2018    PCP: Jarrett Soho, PA-C   REFERRING PROVIDER: Jackelyn Poling, DO  REFERRING DIAG: [phone] (ICD-10-CM) - Other muscle spasm  THERAPY DIAG:  Muscle weakness (generalized)  Cervicalgia  Cramp and spasm  Rationale for Evaluation and Treatment: Rehabilitation  ONSET DATE: 03/26/2023  SUBJECTIVE:  SUBJECTIVE STATEMENT: I realize with swimming, I need to work more on my mid back.  I'm doing so much more now.  Work is not as bad now.  I'm definitely better than I was there.   EVAL: Patient reports she was helping her husband install garage seal on 03-10-23.  She felt severe pain in thoracic spine and neck onset around 03-13-23 and explains that she noticed that she had a lot of swelling and had to get a bigger bra and extender for her bra due to the swelling.  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  Difficulty with driving, sleeping, IADL's self care and working.  I have two kids and caring for them and the dogs has been very challenging.  I am usually very active but unable to do anything right now.  Hand dominance: Right  PERTINENT HISTORY:  na  PAIN:  PAIN:  Are you having pain? Yes NPRS scale:  4/10 mid back right > left Pain location: chest, low shoulder  blades Aggravating factors: stress; overstretching Relieving factors: DN   PRECAUTIONS: None  RED FLAGS: None     WEIGHT BEARING RESTRICTIONS: No  FALLS:  Has patient fallen in last 6 months? No  LIVING ENVIRONMENT: Lives with: lives with their family and lives with their spouse Lives in: House/apartment   OCCUPATION: Pharmacist  PLOF: Independent, Independent with basic ADLs, Independent with household mobility without device, Independent with community mobility without device, Independent with homemaking with ambulation, Independent with gait, and Independent with transfers  PATIENT GOALS: to eliminate pain and be able to work and do my usual daily activities and sleep without interruption The Patient-Specific Functional Scale  Initial:  I am going to ask you to identify up to 3 important activities that you are unable to do or are having difficulty with as a result of this problem.  Today are there any activities that you are unable to do or having difficulty with because of this?  (Patient shown scale and patient rated each activity)  Follow up: When you first came in you had difficulty performing these activities.  Today do you still have difficulty?  Patient-Specific activity scoring scheme (Point to one number):  0 1 2 3 4 5 6 7 8 9  10 Unable                                                                                                          Able to perform To perform  activity at the same Activity         Level as before                                                                                                                       Injury or problem  Activity       Stamina to work all day                                                                       10/15     5                   2.             Driving the kids to school and picking up                                     10/15            6                      3.            Doing extended housework                                                            10/15                4   4. Able to push the cart at [employer]    NEXT MD VISIT: 04/10/23  OBJECTIVE:   DIAGNOSTIC FINDINGS:  MRI showed small lesion 4 mm breast and had biopsy in July; normal but recommended another MRI and biopsy   PATIENT SURVEYS:  NDI 80% self disability score  NDI 10/15:  54%  NDI 11/26:   48%   COGNITION: Overall cognitive status: Within functional limits for tasks assessed  SENSATION: WFL  POSTURE: rounded shoulders  PALPATION: Tight bands and trigger points in R > L upper traps and rhomboids   CERVICAL ROM:   Active ROM A/PROM (deg) eval 10/15 11/26  Flexion WNL WNL WNL  Extension WNL WNL   Right lateral flexion WNL WNL   Left lateral flexion WNL WNL   Right rotation WNL WNL   Left rotation WNL WNL    (Blank rows = not tested)   UPPER EXTREMITY MMT: 5/5 throughout bilateral upper extremities but patient states that she "is going to be so  sore" from the muscle testing throughout the test.   TODAY'S TREATMENT:   11/26 Nu-Step L3 ( blue machine) 5 min NDI Thread the needle green band with other hand in the chair 10x right/left  Standing 35# lat pulls 5x  3# alternating overhead press 10x Green loop wall lift offs 10x Manual therapy to bil upper traps, periscapular musculature Trigger Point Dry-Needling  Treatment instructions: Expect mild to moderate muscle soreness. S/S of pneumothorax if dry needled over a lung field, and to seek immediate medical attention should they occur. Patient verbalized understanding of these instructions and education.  Patient Consent Given: Yes Education handout provided: Previously provided Muscles treated:  bil upper traps;bil teres, bil levator scap; right rhomboids; right subscapularis; bil posterior deltoids Electrical stimulation performed:  No Parameters: N/A Treatment response/outcome: dec tender point size/number, twitch response and decreased muscle tension  Heat following DN 11/22 Nu-Step L3 ( blue machine) 5 min Standing cable row 15# 12x (cue for abdominal draw in and exhale on the pull) Overhead press 2# alternating 10x Standing 30# lat pulls 10x  Planks on forearms on mat table 10 sec hold 3x Green loop steering wheel "drivers" 09W Green band single arm pull with opp hip flexion 10x right/left  Manual therapy to bil upper traps, periscapular musculature Trigger Point Dry-Needling  Treatment instructions: Expect mild to moderate muscle soreness. S/S of pneumothorax if dry needled over a lung field, and to seek immediate medical attention should they occur. Patient verbalized understanding of these instructions and education.  Patient Consent Given: Yes Education handout provided: Previously provided Muscles treated:  bil upper traps;bil teres, bil levator scap; right rhomboids; right subscapularis  Electrical stimulation performed: No Parameters: N/A Treatment response/outcome: dec tender point size/number, twitch response and decreased muscle tension  11/12 Nu-Step L5  ( green machine) 5 min Discussion of swimming and benefits for strengthening upper back [facility] Standing cable row 15# 12x Yellow band 90/90 external rotation and press overhead 5x, then 2x more Standing 30# lat pulls 10x; 35# 5x Green band single arm pull with opp hip flexion 10x right/left  Manual therapy to bil upper traps, neck musculature Trigger Point Dry-Needling  Treatment instructions: Expect mild to moderate muscle soreness. S/S of pneumothorax if dry needled over a lung field, and to seek immediate medical attention should they occur. Patient verbalized understanding of these instructions and education.  Patient Consent Given: Yes Education handout provided: Previously provided Muscles treated:  bil upper  traps; right SCM; bil pectorals, bil subscapularis  Electrical stimulation performed: No Parameters: N/A Treatment response/outcome: dec tender point size/number, twitch response and decreased muscle tension          PATIENT EDUCATION:  Education details:  HEP Person educated: Patient Education method: Programmer, multimedia, Facilities manager, Verbal cues, and Handouts Education comprehension: verbalized understanding, returned demonstration, and verbal cues required  HOME EXERCISE PROGRAM: Access Code: [email or web address] URL: [email or web address] Date: 05/14/2023 Prepared by: Lavinia Sharps  Exercises - Supine Chest Stretch on Foam Roll  - 1 x daily - 7 x weekly - 3 sets - 10 reps - Thoracic Extension Mobilization on Foam Roll  - 1 x daily - 7 x weekly - 3 sets - 10 reps - Supine Shoulder Horizontal Abduction with Resistance  - 1 x daily - 7 x weekly - 3 sets - 10 reps - Supine PNF D2 Flexion with Resistance  - 1 x daily - 7 x weekly - 3 sets - 10 reps - Latissimus Dorsi Stretch at  Wall  - 2 x daily - 7 x weekly - 1 sets - 2-3 reps - 20 hold - Prone Chest Stretch on Chair  - 1 x daily - 7 x weekly - 1 sets - 2-3 reps - 20 hold - Standing Thoracic Open Book at Wall  - 1 x daily - 7 x weekly - 1 sets - 5 reps - Standing Cervical Retraction  - 3-4 x daily - 7 x weekly - 1 sets - 5 reps - Wall Push Up  - 1 x daily - 7 x weekly - 1 sets - 10 reps - Wall Angels  - 1 x daily - 7 x weekly - 1 sets - 10 reps - Shoulder Overhead Press in Flexion with Dumbbells  - 1 x daily - 7 x weekly - 2 sets - 5 reps - Squat with Chair Touch  - 1 x daily - 7 x weekly - 2 sets - 5 reps - Half Deadlift with Kettlebell  - 1 x daily - 7 x weekly - 2 sets - 5 reps - Farmer's Carry with Kettlebells  - 1 x daily - 7 x weekly - 1 sets - 10 reps - Standing Hip Circles  - 1 x daily - 7 x weekly - 1 sets - 10 reps - Seated Assisted Cervical Rotation with Towel  - 1 x daily - 7 x weekly - 1 sets - 10 reps - Cervical Extension  AROM with Strap  - 1 x daily - 7 x weekly - 1 sets - 10 reps - Supine Diaphragmatic Breathing  - 1 x daily - 7 x weekly - 1 sets - 10 reps - Doorway Pec Stretch at 90 Degrees Abduction  - 1 x daily - 7 x weekly - 2 sets - 2 reps - 20 hold - Doorway Pec Stretch at 120 Degrees Abduction  - 1 x daily - 7 x weekly - 2 sets - 2 reps - 20 hold - Standing Quadratus Lumborum Stretch with Doorway  - 1 x daily - 7 x weekly - 2 sets - 2 reps - 20 hold - Standing Lat Pull Down with Resistance - Elbows Bent  - 1 x daily - 7 x weekly - 1 sets - 10 reps - Shoulder extension with resistance - Neutral  - 1 x daily - 7 x weekly - 1 sets - 10 reps - Standing Plank on Wall  - 1 x daily - 7 x weekly - 1 sets - 5 reps - 5 hold - Standing Full Side Plank on Wall  - 1 x daily - 7 x weekly - 1 sets - 3 reps - 5 hold -ASSESSMENT:  CLINICAL IMPRESSION: Much improved NDI compared to initial assessment and October intake.  We have been able to slowly progress exercise intensity with increased resistance with some increase in spasm and pain but pt acknowledges that this subsides fairly quickly.  She responds well to DN with numerous twitch responses which is a good prognostic indicator.  Decreased tender points following treatment session.  Therapist monitoring response to all interventions.  OBJECTIVE IMPAIRMENTS: increased fascial restrictions and increased muscle spasms.   ACTIVITY LIMITATIONS: carrying, lifting, bending, sitting, squatting, sleeping, transfers, bed mobility, bathing, dressing, and hygiene/grooming  PARTICIPATION LIMITATIONS: meal prep, cleaning, laundry, interpersonal relationship, driving, shopping, community activity, and occupation  PERSONAL FACTORS: Education, Past/current experiences, and 3+ comorbidities: anxiety , depression and chronic pain  are also affecting patient's functional outcome.   REHAB POTENTIAL: Fair due  to   CLINICAL DECISION MAKING: Unstable/unpredictable  EVALUATION  COMPLEXITY: High   GOALS: Goals reviewed with patient? Yes  SHORT TERM GOALS: Target date: 04/24/2023   Patient to report pain no greater than 2/10  Baseline:  Goal status: ongoing  2.  Patient will be independent with initial HEP  Baseline:  Goal status: met 9/17    LONG TERM GOALS: Target date:07/30/2023   Patient to report an overall improvement at 60% with home and work ADLs Baseline:  Goal status: revised  2.  Patient to be independent with advanced HEP  Baseline:  Goal status:ongoing  3.  Patient to be able to sleep through the night  Baseline:  Goal status: ongoing 4.  Patient to be able to return to work Baseline:  Goal status: met 10/15  5.  Neck pain disablity score to improve by 5 Baseline:  Goal status: met 10/15  6.  Patient to report PSFS score of 6 with doing housework for extended period of time Baseline:  Goal status: INITIAL 7.  Patient will report PSFS of 8 with driving kids to and from school New  8. PSFS of 6 with stamina to work a long shift new   PLAN:  PT FREQUENCY: 1x/week  PT DURATION: 10 weeks  PLANNED INTERVENTIONS: Therapeutic exercises, Therapeutic activity, Neuromuscular re-education, Balance training, Gait training, Patient/Family education, Self Care, Joint mobilization, Vestibular training, Canalith repositioning, Aquatic Therapy, Dry Needling, Electrical stimulation, Spinal mobilization, Cryotherapy, Moist heat, Taping, Vasopneumatic device, Traction, Ultrasound, Ionotophoresis 4mg /ml Dexamethasone, and Manual therapy  PLAN FOR NEXT SESSION:   steering wheels side/lateral wall planks; DN;  Nu-step, review HEP, progress postural strengthening including resistance training upper quarter  Lavinia Sharps, PT 07/02/23 9:39 AM Phone: [phone] Fax: [phone]

## 2023-07-08 ENCOUNTER — Encounter
Payer: Managed Care, Other (non HMO) | Attending: Physical Medicine and Rehabilitation | Admitting: Physical Medicine and Rehabilitation

## 2023-07-08 ENCOUNTER — Encounter: Payer: Self-pay | Admitting: Physical Medicine and Rehabilitation

## 2023-07-08 VITALS — BP 113/73 | HR 73 | Ht 63.0 in | Wt 137.0 lb

## 2023-07-08 DIAGNOSIS — G8929 Other chronic pain: Secondary | ICD-10-CM | POA: Diagnosis present

## 2023-07-08 DIAGNOSIS — M797 Fibromyalgia: Secondary | ICD-10-CM | POA: Insufficient documentation

## 2023-07-08 DIAGNOSIS — M7918 Myalgia, other site: Secondary | ICD-10-CM | POA: Insufficient documentation

## 2023-07-08 DIAGNOSIS — M542 Cervicalgia: Secondary | ICD-10-CM | POA: Diagnosis present

## 2023-07-08 NOTE — Progress Notes (Signed)
Subjective:    Patient ID: Adrienne Williams, female    DOB: 1981/06/01, 42 y.o.   MRN: [id number]  HPI  Adrienne Williams is a 42 y.o. year old female  who  has a past medical history of Anxiety, Asthma, Depression, Family history of breast cancer, Frank breech presentation (05/29/2018), Gene mutation, Hemorrhoids, varicella, PONV (postoperative nausea and vomiting), and Postpartum care following cesarean delivery (6/14) (01/18/2016).   They are presenting to PM&R clinic for follow up related to  fibromyalgia pain.  .  Plan from last visit:  Fibromyalgia I will message you once your naltrexone script is sent in to a compounding pharmacy; cost is generally between $30-70 per month out of pocket. This will most likely be filled at [facility] or [facility].    Start low dose naltrexone at 1.5 mg dose (1 capsule) on an empty stomach at night for 1 week. If no side effects, increase to 3 mg (2 capsules) at nighttime for 3 weeks. You will need to call the office or message me through mychart at week 2-3 and report effects before I will prescribe the medication further; based on your response, we may keep 3 mg nightly or increase up to 4.5 mg nightly.    This is NOT an as needed medication, you must take it CONSISTENTLY to appreciate a benefit, but it can be stopped safely without weaning if needed. Call clinic if any questions     If you do not tolerate this medicaiton, we may do a short course of tramadol #15 tabs for severe pain only.    I have refilled your duloxetine 60 mg BID and your baclofen 5-10 mg three times daily as needed.   Continue staying active, meditating and attenting PT as discussed; you are doing awesome here! Stay aware of your activity level and try not to overdo it if possible.    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    Follow up in 1 month   Myofascial  pain Neck pain, chronic Does best with dry needling with PT rather than trigger points; continue this   Today, I provided you a handout on dietary interventions for pain and anxiety emphasizing low inflammatory diet   Anxiety and depression Seeing psychiatrist regularly, some external stressors are improving and I anticipate this will help with pain as well.    Poor sleep   For sleep, I want you to pick a time to lay down every night, ideally between 8 and 10 PM.     Starting 1 hour before you want to go to sleep, turn off all television screens, phone screens, tablets, and computers.     Keep the lights low and perform only low stimulation activities, such as reading.     Only use your bedroom for sleep and sex.     You may also take 3 to 5 mg of over-the-counter melatonin approximately 1 hour before bedtime or your prescribed ambien.    Interval Hx:  - Therapies: Wrapping up PT right now and doing pelvic floor PT, has been working on relaxing her pelvic floor before strengthening. That has been "life changing".   She is doing swimming again since running was causing too much tension; she is a "good tired" and a bit sore from that. She says she is motivated to keep up cardiovascular exercise but has a tendency  to overdo it.   Has been doing horseback riding lessons; has needed to cancel occassionally because she has been overdoing it.    - Follow ups: Prior C-section and laparoscopic surgery for ectopic pregnancy; has been doing abdominal cupping for this but accidentally left one on overnight after falling asleep. Has not had any persistent bruising or discomfort from it, but is looking for alternative means of control because this scared her.    - Medications: Increased naltrexone to 4.5 mg at bedtime now; has been going well!   - Other concerns: work has been better about allowing accomidations; has been using during her 30 minute lunch break and feel it has been necessary to  keeping her going at work.   Pain Inventory Average Pain 3 Pain Right Now 4 My pain is intermittent, sharp, dull, stabbing, tingling, and aching  In the last 24 hours, has pain interfered with the following? General activity 2 Relation with others 3 Enjoyment of life 3 What TIME of day is your pain at its worst? evening Sleep (in general) Fair  Pain is worse with: walking, sitting, inactivity, standing, unsure, and some activites Pain improves with: rest, heat/ice, therapy/exercise, and medication Relief from Meds: 7  Family History  Problem Relation Age of Onset   ADD / ADHD Brother    Breast cancer Maternal Grandmother 52   Cancer Maternal Grandmother        stomach, possible met from breast cancer   Hypothyroidism Paternal Grandmother    Atrial fibrillation Paternal Grandmother    Angina Paternal Grandfather    Hypertension Paternal Grandfather    Hyperlipidemia Paternal Grandfather    Colon polyps Paternal Grandfather    Colon cancer Neg Hx    Esophageal cancer Neg Hx    Social History   Socioeconomic History   Marital status: Married    Spouse name: Not on file   Number of children: 2   Years of education: Not on file   Highest education level: Not on file  Occupational History   Occupation: Pharmacist  Tobacco Use   Smoking status: Never   Smokeless tobacco: Never  Vaping Use   Vaping status: Never Used  Substance and Sexual Activity   Alcohol use: No   Drug use: No   Sexual activity: Yes  Other Topics Concern   Not on file  Social History Narrative   Not on file   Social Determinants of Health   Financial Resource Strain: Low Risk  (05/26/2018)   Overall Financial Resource Strain (CARDIA)    Difficulty of Paying Living Expenses: Not hard at all  Food Insecurity: Low Risk  (05/31/2023)   Received from [facility]   Hunger Vital Sign    Worried About Running Out of Food in the Last Year: Never true    Ran Out of Food in the Last Year: Never  true  Transportation Needs: No Transportation Needs (05/31/2023)   Received from [facility]    In the past 12 months, has lack of reliable transportation kept you from medical appointments, meetings, work or from getting things needed for daily living? : No  Physical Activity: Not on file  Stress: Stress Concern Present (05/26/2018)   [employer] of Occupational Health - Occupational Stress Questionnaire    Feeling of Stress : To some extent  Social Connections: Not on file   Past Surgical History:  Procedure Laterality Date   BREAST BIOPSY Right 02/25/2023   CESAREAN SECTION N/A 01/18/2016  Procedure: Primary CESAREAN SECTION;  Surgeon: Genia Del, MD;  Location: [facility] B[facility]THING SUITES;  Service: Obstetrics;  Laterality: N/A;  EDD: 01/25/16   CESAREAN SECTION N/A 05/29/2018   Procedure: Repeat CESAREAN SECTION;  Surgeon: Shea Evans, MD;  Location: [facility]THING SU[facility];  Service: Obstetrics;  Laterality: N/A;  EDD: 06/10/18   COLONOSCOPY     CYSTECTOMY  2002   pilonidal region   DIAGNOSTIC LAPAROSCOPY WITH REMOVAL OF ECTOPIC PREGNANCY Right 05/29/2020   Procedure: DIAGNOSTIC LAPAROSCOPY WITH REMOVAL OF ECTOPIC PREGNANCY;  Surgeon: Shea Evans, MD;  Location: [facility] OR;  Service: Gynecology;  Laterality: Right;   MOLE REMOVAL     UNILATERAL SALPINGECTOMY Right 05/29/2020   Procedure: UNILATERAL SALPINGECTOMY;  Surgeon: Shea Evans, MD;  Location: [facility] OR;  Service: Gynecology;  Laterality: Right;   WISDOM TOOTH EXTRACTION     Past Surgical History:  Procedure Laterality Date   BREAST BIOPSY Right 02/25/2023   CESAREAN SECTION N/A 01/18/2016   Procedure: Primary CESAREAN SECTION;  Surgeon: Genia Del, MD;  Location: [facility] BIRTHING SUIT[facility];  Service: Obstetrics;  Laterality: N/A;  EDD: 01/25/16   CESAREAN SECTION N/A 05/29/2018   Procedure: Repeat CESAREAN SECTION;  Surgeon: Shea Evans, MD;  Location: [facility] BIRTHING SUITES;  Service: Obstetrics;   Laterality: N/A;  EDD: 06/10/18   COLONOSCOPY     CYSTECTOMY  2002   pilonidal region   DIAGNOSTIC LAPAROSCOPY WITH REMOVAL OF ECTOPIC PREGNANCY Right 05/29/2020   Procedure: DIAGNOSTIC LAPAROSCOPY WITH REMOVAL OF ECTOPIC PREGNANCY;  Surgeon: Shea Evans, MD;  Location: [facility] OR;  Service: Gynecology;  Laterality: Right;   MOLE REMOVAL     UNILATERAL SALPINGECTOMY Right 05/29/2020   Procedure: UNILATERAL SALPINGECTOMY;  Surgeon: Shea Evans, MD;  Location: [facility] OR;  Service: Gynecology;  Laterality: Right;   WISDOM TOOTH EXTRACTION     Past Medical History:  Diagnosis Date   Anxiety    Asthma    Depression    Family history of breast cancer    Frank breech presentation 05/29/2018   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   Hemorrhoids    Hx of varicella    PONV (postoperative nausea and vomiting)    "little nauseated"   Postpartum care following cesarean delivery (6/14) 01/18/2016   Ht 5\' 3"  (1.6 m)   Wt 137 lb (62.1 kg)   BMI 24.27 kg/m   Opioid Risk Score:   Fall Risk Score:  `1  Depression screen [facility] 2/9     07/08/2023    2:11 PM 05/22/2023    9:24 AM 09/26/2022    9:37 AM 06/27/2022    9:20 AM  Depression screen PHQ 2/9  Decreased Interest 0 1 1 2   Down, Depressed, Hopeless 0 1 1 3   PHQ - 2 Score 0 2 2 5   Altered sleeping    2  Tired, decreased energy    1  Change in appetite    1  Feeling bad or failure about yourself     1  Trouble concentrating    1  Moving slowly or fidgety/restless    0  Suicidal thoughts    0  PHQ-9 Score    11  Difficult doing work/chores    Somewhat difficult      Review of Systems  Musculoskeletal:  Positive for back pain, gait problem and neck pain.       B/L hip pain   All other systems reviewed and are negative.     Objective:  Physical Exam   Constitution: Appropriate appearance for age. No apparent distress    HEENT: PERRL, EOMI grossly intact.  Resp: CTAB. No rales, rhonchi, or wheezing. Cardio:  RRR. No mumurs, rubs, or gallops. No peripheral edema. Abdomen: Nondistended.  Nontender.  No obvious distention. Psych: appropriate affect.  Happy, positive mood today.  Unchanged from prior exams.  Neuro: AAOx4. No apparent deficits Sensation intact throughout  MSK: 5/5 strength in bilateral upper and lower extremities on exam.  Gait normal.   Cervical Exam:  Continues with tenderness to palpation throughout bilateral neck, shoulders, and thoracic spine.  Ongoing tight, ropey texture in the bilateral cervical paraspinals and trapezius muscles.   Flexion  WNL, Extension  WNL, Rotation  WNL b/l, Sidebending WNL b/l and neck and thoracolumbar spine.      Assessment & Plan:   Adrienne Williams is a 42 y.o. year old female  who  has a past medical history of Anxiety, Asthma, Depression, Family history of breast cancer, Frank breech presentation (05/29/2018), Gene mutation, Hemorrhoids, varicella, PONV (postoperative nausea and vomiting), and Postpartum care following cesarean delivery (6/14) (01/18/2016).    They are presenting to PM&R clinic for follow up related to  fibromyalgia pain.  .  Myofascial pain Neck pain, chronic Continue low dose naltrexone at current dose; send me a clinic message when you are starting to run out of medication  Uses gentle massage, myofascial release, and a heat pack on your abdomen during work breaks to help with abdominal pain.   Continue pelvic PT!   Fibromyalgia  Try to limit horseback riding to 1-2x weekly, along with other high-impact exercises. Swimming is great but please ensure you get 1-2 days a week of breaks to prevent exacerbating your pain.   Let me kow when your work accommodations need renewed.   Follow up with me in 4-5 months

## 2023-07-08 NOTE — Patient Instructions (Addendum)
Continue low dose naltrexone at current dose; send me a clinic message when you are starting to run out of medication  Try to limit horseback riding to 1-2x weekly, along with other high-impact exercises. Swimming is great but please ensure you get 1-2 days a week of breaks to prevent exacerbating your pain.   Uses gentle massage, myofascial release, and a heat pack on your abdomen during work breaks to help with abdominal pain.   Continue pelvic PT!   Let me kow when your work accommodations need renewed.   Follow up with me in 4-5 months

## 2023-07-09 ENCOUNTER — Encounter: Payer: Self-pay | Admitting: Physical Medicine and Rehabilitation

## 2023-07-09 ENCOUNTER — Ambulatory Visit: Payer: Managed Care, Other (non HMO) | Attending: Obstetrics and Gynecology | Admitting: Physical Therapy

## 2023-07-09 DIAGNOSIS — R279 Unspecified lack of coordination: Secondary | ICD-10-CM | POA: Insufficient documentation

## 2023-07-09 DIAGNOSIS — R252 Cramp and spasm: Secondary | ICD-10-CM | POA: Insufficient documentation

## 2023-07-09 DIAGNOSIS — M62838 Other muscle spasm: Secondary | ICD-10-CM | POA: Insufficient documentation

## 2023-07-09 DIAGNOSIS — M797 Fibromyalgia: Secondary | ICD-10-CM

## 2023-07-09 DIAGNOSIS — M6281 Muscle weakness (generalized): Secondary | ICD-10-CM | POA: Insufficient documentation

## 2023-07-09 DIAGNOSIS — M546 Pain in thoracic spine: Secondary | ICD-10-CM | POA: Diagnosis present

## 2023-07-09 DIAGNOSIS — M542 Cervicalgia: Secondary | ICD-10-CM | POA: Diagnosis present

## 2023-07-09 DIAGNOSIS — M7918 Myalgia, other site: Secondary | ICD-10-CM

## 2023-07-09 DIAGNOSIS — G8929 Other chronic pain: Secondary | ICD-10-CM

## 2023-07-09 NOTE — Therapy (Signed)
OUTPATIENT PHYSICAL THERAPY CERVICAL TREATMENT NOTE   Patient Name: Adrienne Williams MRN: [id number] DOB:1981-07-28, 42 y.o., female Today's Date: 07/09/2023  END OF SESSION:  PT End of Session - 07/09/23 1021     Visit Number 21    Date for PT Re-Evaluation 07/30/23    PT Start Time 1015    PT Stop Time 1100    PT Time Calculation (min) 45 min    Activity Tolerance Patient tolerated treatment well    Behavior During Therapy [facility] for tasks assessed/performed                Past Medical History:  Diagnosis Date   Anxiety    Asthma    Depression    Family history of breast cancer    Frank breech presentation 05/29/2018   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   Hemorrhoids    Hx of varicella    PONV (postoperative nausea and vomiting)    "little nauseated"   Postpartum care following cesarean delivery (6/14) 01/18/2016   Past Surgical History:  Procedure Laterality Date   BREAST BIOPSY Right 02/25/2023   CESAREAN SECTION N/A 01/18/2016   Procedure: Primary CESAREAN SECTION;  Surgeon: Genia Del, MD;  Location: [facility];  Service: Obstetrics;  Laterality: N/A;  EDD: 01/25/16   CESAREAN SECTION N/A 05/29/2018   Procedure: Repeat CESAREAN SECTION;  Surgeon: Shea Evans, MD;  Location: [facility];  Service: Obstetrics;  Laterality: N/A;  EDD: 06/10/18   COLONOSCOPY     CYSTECTOMY  2002   pilonidal region   DIAGNOSTIC LAPAROSCOPY WITH REMOVAL OF ECTOPIC PREGNANCY Right 05/29/2020   Procedure: DIAGNOSTIC LAPAROSCOPY WITH REMOVAL OF ECTOPIC PREGNANCY;  Surgeon: Shea Evans, MD;  Location: [facility] OR;  Service: Gynecology;  Laterality: Right;   MOLE REMOVAL     UNILATERAL SALPINGECTOMY Right 05/29/2020   Procedure: UNILATERAL SALPINGECTOMY;  Surgeon: Shea Evans, MD;  Location: [facility] OR;  Service: Gynecology;  Laterality: Right;   WISDOM TOOTH EXTRACTION     Patient Active Problem List   Diagnosis Date Noted   Poor sleep  05/22/2023   Neck pain, chronic 06/27/2022   Fibromyalgia 06/27/2022   Myofascial pain 06/27/2022   Internal hemorrhoids 06/13/2022   Rectal bleeding 06/13/2022   Family history of breast cancer 01/18/2022   Monoallelic mutation of CHEK2 gene in female patient 01/18/2022   Anxiety and depression 05/31/2018   Rh negative, maternal 05/31/2018   Homero Fellers breech presentation 05/29/2018   Status post repeat low transverse cesarean section 05/29/2018   Postpartum care following cesarean delivery (10/24) 05/29/2018    PCP: Jarrett Soho, PA-C   REFERRING PROVIDER: Jackelyn Poling, DO  REFERRING DIAG: [phone] (ICD-10-CM) - Other muscle spasm  THERAPY DIAG:  Muscle weakness (generalized)  Other muscle spasm  Unspecified lack of coordination  Cramp and spasm  Rationale for Evaluation and Treatment: Rehabilitation  ONSET DATE: 03/26/2023  SUBJECTIVE:  SUBJECTIVE STATEMENT: I started swimming.  It's going well.  I wanted to talk to you about what I'm working there.  Weekend chest tightness. I only got 4 1/2 hours of sleep.   EVAL: Patient reports she was helping her husband install garage seal on 03-10-23.  She felt severe pain in thoracic spine and neck onset around 03-13-23 and explains that she noticed that she had a lot of swelling and had to get a bigger bra and extender for her bra due to the swelling.  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  Difficulty with driving, sleeping, IADL's self care and working.  I have two kids and caring for them and the dogs has been very challenging.  I am usually very active but unable to do anything right now.  Hand dominance: Right  PERTINENT HISTORY:  na  PAIN:  PAIN:  Are you having pain? Yes NPRS scale:   3-4/10 Pain location: chest, low shoulder blades Aggravating factors: stress; overstretching Relieving factors: DN   PRECAUTIONS: None  RED FLAGS: None     WEIGHT BEARING RESTRICTIONS: No  FALLS:  Has patient fallen in last 6 months? No  LIVING ENVIRONMENT: Lives with: lives with their family and lives with their spouse Lives in: House/apartment   OCCUPATION: Pharmacist  PLOF: Independent, Independent with basic ADLs, Independent with household mobility without device, Independent with community mobility without device, Independent with homemaking with ambulation, Independent with gait, and Independent with transfers  PATIENT GOALS: to eliminate pain and be able to work and do my usual daily activities and sleep without interruption The Patient-Specific Functional Scale  Initial:  I am going to ask you to identify up to 3 important activities that you are unable to do or are having difficulty with as a result of this problem.  Today are there any activities that you are unable to do or having difficulty with because of this?  (Patient shown scale and patient rated each activity)  Follow up: When you first came in you had difficulty performing these activities.  Today do you still have difficulty?  Patient-Specific activity scoring scheme (Point to one number):  0 1 2 3 4 5 6 7 8 9  10 Unable                                                                                                          Able to perform To perform                                                                                                    activity  at the same Activity         Level as before                                                                                                                       Injury or problem  Activity       Stamina to work all day                                                                       10/15     5                   2.             Driving the kids  to school and picking up                                    10/15            6                      3.            Doing extended housework                                                            10/15                4   4. Able to push the cart at [employer]    NEXT MD VISIT: 04/10/23  OBJECTIVE:   DIAGNOSTIC FINDINGS:  MRI showed small lesion 4 mm breast and had biopsy in July; normal but recommended another MRI and biopsy   PATIENT SURVEYS:  NDI 80% self disability score  NDI 10/15:  54%  COGNITION: Overall cognitive status: Within functional limits for tasks assessed  SENSATION: WFL  POSTURE: rounded shoulders  PALPATION: Tight bands and trigger points in R > L upper traps and rhomboids   CERVICAL ROM:   Active ROM A/PROM (deg) eval 10/15  Flexion WNL WNL  Extension WNL WNL  Right lateral flexion WNL WNL  Left lateral flexion WNL WNL  Right rotation WNL WNL  Left rotation WNL WNL   (Blank rows = not tested)   UPPER EXTREMITY MMT: 5/5 throughout bilateral upper extremities but patient states that she "is going to be so sore" from the muscle testing throughout the test.   TODAY'S TREATMENT:   1/12  Nu-Step L5  ( green machine) 5 min Discussion of swimming and benefits for strengthening upper back [facility] cable row 15# 12x Yellow band 90/90 external rotation and press overhead 5x, then 2x more Standing 30# lat pulls 10x; 35# 5x Green band single arm pull with opp hip flexion 10x right/left  Manual therapy to bil upper traps, neck musculature Trigger Point Dry-Needling  Treatment instructions: Expect mild to moderate muscle soreness. S/S of pneumothorax if dry needled over a lung field, and to seek immediate medical attention should they occur. Patient verbalized understanding of these instructions and education.  Patient Consent Given: Yes Education handout provided: Previously provided Muscles treated:  bil upper traps; right  SCM; bil pectorals, bil subscapularis  Electrical stimulation performed: No Parameters: N/A Treatment response/outcome: dec tender point size/number, twitch response and decreased muscle tension   10/29 Nu-Step L3  ( blue machine) 5 min Seated peanut ball to lengthen trunk musculature 5x right/left Sidelying pelvic oscillations using peanut ball Open books with peanut ball 10x  Recorded pt doing the peanut ball ex's with her phone since they are not on [employer] (pt interested in getting a peanut for home) Standing cable row 15# 10x Standing 30# lat pulls 15x   Manual therapy to bil upper traps, scapular musculature Trigger Point Dry-Needling  Treatment instructions: Expect mild to moderate muscle soreness. S/S of pneumothorax if dry needled over a lung field, and to seek immediate medical attention should they occur. Patient verbalized understanding of these instructions and education.  Patient Consent Given: Yes Education handout provided: Previously provided Muscles treated: bil teres, bil lats; bil upper traps and levator scap muscles Electrical stimulation performed: No Parameters: N/A Treatment response/outcome: dec tender point size/number, twitch response and decreased muscle tension  Heat 10 min     10/22 Nu-Step L3  ( blue machine) 5 min Standing cable row 15# 8x Wall lift offs with green loop 4x  (very challenging) Squats with 10# KB 10x Farmer's carry hold with BOSU alternating step taps 10x right/left sides Standing 30# lat pulls 12x   Manual therapy to bil upper traps cervical musculature Trigger Point Dry-Needling  Treatment instructions: Expect mild to moderate muscle soreness. S/S of pneumothorax if dry needled over a lung field, and to seek immediate medical attention should they occur. Patient verbalized understanding of these instructions and education.  Patient Consent Given: Yes Education handout provided: Previously provided Muscles treated: bil teres, bil  lats; bil upper trap (ant and post approach); bil pecs Electrical stimulation performed: No Parameters: N/A Treatment response/outcome: dec tender point size/number, twitch response and decreased muscle tension  Heat 2 min            PATIENT EDUCATION:  Education details:  HEP Person educated: Patient Education method: Programmer, multimedia, Facilities manager, Verbal cues, and Handouts Education comprehension: verbalized understanding, returned demonstration, and verbal cues required  HOME EXERCISE PROGRAM: Access Code: [email or web address] URL: [email or web address] Date: 05/14/2023 Prepared by: Lavinia Sharps  Exercises - Supine Chest Stretch on Foam Roll  - 1 x daily - 7 x weekly - 3 sets - 10 reps - Thoracic Extension Mobilization on Foam Roll  - 1 x daily - 7 x weekly - 3 sets - 10 reps - Supine Shoulder Horizontal Abduction with Resistance  - 1 x daily - 7 x weekly - 3 sets - 10 reps - Supine PNF D2 Flexion with Resistance  - 1 x daily - 7 x weekly - 3 sets - 10 reps -  Latissimus Dorsi Stretch at Wall  - 2 x daily - 7 x weekly - 1 sets - 2-3 reps - 20 hold - Prone Chest Stretch on Chair  - 1 x daily - 7 x weekly - 1 sets - 2-3 reps - 20 hold - Standing Thoracic Open Book at Wall  - 1 x daily - 7 x weekly - 1 sets - 5 reps - Standing Cervical Retraction  - 3-4 x daily - 7 x weekly - 1 sets - 5 reps - Wall Push Up  - 1 x daily - 7 x weekly - 1 sets - 10 reps - Wall Angels  - 1 x daily - 7 x weekly - 1 sets - 10 reps - Shoulder Overhead Press in Flexion with Dumbbells  - 1 x daily - 7 x weekly - 2 sets - 5 reps - Squat with Chair Touch  - 1 x daily - 7 x weekly - 2 sets - 5 reps - Half Deadlift with Kettlebell  - 1 x daily - 7 x weekly - 2 sets - 5 reps - Farmer's Carry with Kettlebells  - 1 x daily - 7 x weekly - 1 sets - 10 reps - Standing Hip Circles  - 1 x daily - 7 x weekly - 1 sets - 10 reps - Seated Assisted Cervical Rotation with Towel  - 1 x daily - 7 x weekly - 1 sets - 10 reps -  Cervical Extension AROM with Strap  - 1 x daily - 7 x weekly - 1 sets - 10 reps - Supine Diaphragmatic Breathing  - 1 x daily - 7 x weekly - 1 sets - 10 reps - Doorway Pec Stretch at 90 Degrees Abduction  - 1 x daily - 7 x weekly - 2 sets - 2 reps - 20 hold - Doorway Pec Stretch at 120 Degrees Abduction  - 1 x daily - 7 x weekly - 2 sets - 2 reps - 20 hold - Standing Quadratus Lumborum Stretch with Doorway  - 1 x daily - 7 x weekly - 2 sets - 2 reps - 20 hold - Standing Lat Pull Down with Resistance - Elbows Bent  - 1 x daily - 7 x weekly - 1 sets - 10 reps - Shoulder extension with resistance - Neutral  - 1 x daily - 7 x weekly - 1 sets - 10 reps - Standing Plank on Wall  - 1 x daily - 7 x weekly - 1 sets - 5 reps - 5 hold - Standing Full Side Plank on Wall  - 1 x daily - 7 x weekly - 1 sets - 3 reps - 5 hold -ASSESSMENT:  CLINICAL IMPRESSION: Able to continue with a strengthening progression emphasizing posterior chain muscles.  Therapist providing verbal cues to optimize technique with  exercises in order to achieve the greatest benefit. The patient benefits significantly from dry needling and manual therapy to stimulate underlying myofascial trigger points and muscular tissue for management of neuromusculoskeletal pain with much improved soft tissue mobility  following treatment session.      OBJECTIVE IMPAIRMENTS: increased fascial restrictions and increased muscle spasms.   ACTIVITY LIMITATIONS: carrying, lifting, bending, sitting, squatting, sleeping, transfers, bed mobility, bathing, dressing, and hygiene/grooming  PARTICIPATION LIMITATIONS: meal prep, cleaning, laundry, interpersonal relationship, driving, shopping, community activity, and occupation  PERSONAL FACTORS: Education, Past/current experiences, and 3+ comorbidities: anxiety , depression and chronic pain  are also affecting patient's functional outcome.   REHAB  POTENTIAL: Fair due to   CLINICAL DECISION MAKING:  Unstable/unpredictable  EVALUATION COMPLEXITY: High   GOALS: Goals reviewed with patient? Yes  SHORT TERM GOALS: Target date: 04/24/2023   Patient to report pain no greater than 2/10  Baseline:  Goal status: ongoing  2.  Patient will be independent with initial HEP  Baseline:  Goal status: met 9/17    LONG TERM GOALS: Target date:07/30/2023   Patient to report an overall improvement at 60% with home and work ADLs Baseline:  Goal status: revised  2.  Patient to be independent with advanced HEP  Baseline:  Goal status:ongoing  3.  Patient to be able to sleep through the night  Baseline:  Goal status: ongoing 4.  Patient to be able to return to work Baseline:  Goal status: met 10/15  5.  Neck pain disablity score to improve by 5 Baseline:  Goal status: met 10/15  6.  Patient to report PSFS score of 6 with doing housework for extended period of time Baseline:  Goal status: INITIAL 7.  Patient will report PSFS of 8 with driving kids to and from school New  8. PSFS of 6 with stamina to work a long shift new   PLAN:  PT FREQUENCY: 1x/week  PT DURATION: 10 weeks  PLANNED INTERVENTIONS: Therapeutic exercises, Therapeutic activity, Neuromuscular re-education, Balance training, Gait training, Patient/Family education, Self Care, Joint mobilization, Vestibular training, Canalith repositioning, Aquatic Therapy, Dry Needling, Electrical stimulation, Spinal mobilization, Cryotherapy, Moist heat, Taping, Vasopneumatic device, Traction, Ultrasound, Ionotophoresis 4mg /ml Dexamethasone, and Manual therapy  PLAN FOR NEXT SESSION:  steering wheels side/lateral wall planks; DN;  Nu-step, review HEP, progress postural strengthening including resistance training upper quarter  Lavinia Sharps, PT 07/09/23 11:00 AM Phone: [phone] Fax: [phone]                                 OUTPATIENT PHYSICAL THERAPY FEMALE PELVIC TREATMENT   Patient  Name: Adrienne Williams MRN: [id number] DOB:04/13/1981, 42 y.o., female Today's Date: 07/09/2023  END OF SESSION:  PT End of Session - 07/09/23 1021     Visit Number 21    Date for PT Re-Evaluation 07/30/23    PT Start Time 1015    PT Stop Time 1100    PT Time Calculation (min) 45 min    Activity Tolerance Patient tolerated treatment well    Behavior During Therapy [facility] for tasks assessed/performed                Past Medical History:  Diagnosis Date   Anxiety    Asthma    Depression    Family history of breast cancer    Frank breech presentation 05/29/2018   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   Hemorrhoids    Hx of varicella    PONV (postoperative nausea and vomiting)    "little nauseated"   Postpartum care following cesarean delivery (6/14) 01/18/2016   Past Surgical History:  Procedure Laterality Date   BREAST BIOPSY Right 02/25/2023   CESAREAN SECTION N/A 01/18/2016   Procedure: Primary CESAREAN SECTION;  Surgeon: Genia Del, MD;  Location: [facility];  Service: Obstetrics;  Laterality: N/A;  EDD: 01/25/16   CESAREAN SECTION N/A 05/29/2018   Procedure: Repeat CESAREAN SECTION;  Surgeon: Shea Evans, MD;  Location: [facility] BIRTHING SUITES;  Service: Obstetrics;  Laterality: N/A;  EDD: 06/10/18   COLONOSCOPY  CYSTECTOMY  2002   pilonidal region   DIAGNOSTIC LAPAROSCOPY WITH REMOVAL OF ECTOPIC PREGNANCY Right 05/29/2020   Procedure: DIAGNOSTIC LAPAROSCOPY WITH REMOVAL OF ECTOPIC PREGNANCY;  Surgeon: Shea Evans, MD;  Location: [facility] OR;  Service: Gynecology;  Laterality: Right;   MOLE REMOVAL     UNILATERAL SALPINGECTOMY Right 05/29/2020   Procedure: UNILATERAL SALPINGECTOMY;  Surgeon: Shea Evans, MD;  Location: [facility] OR;  Service: Gynecology;  Laterality: Right;   WISDOM TOOTH EXTRACTION     Patient Active Problem List   Diagnosis Date Noted   Poor sleep 05/22/2023   Neck pain, chronic 06/27/2022   Fibromyalgia 06/27/2022    Myofascial pain 06/27/2022   Internal hemorrhoids 06/13/2022   Rectal bleeding 06/13/2022   Family history of breast cancer 01/18/2022   Monoallelic mutation of CHEK2 gene in female patient 01/18/2022   Anxiety and depression 05/31/2018   Rh negative, maternal 05/31/2018   Homero Fellers breech presentation 05/29/2018   Status post repeat low transverse cesarean section 05/29/2018   Postpartum care following cesarean delivery (10/24) 05/29/2018    PCP: Jarrett Soho, PA-C PCP - General  REFERRING PROVIDER: Vick Frees, MD Ref Provider  REFERRING DIAG: N39.3 (ICD-10-CM) - Stress incontinence (female) (female)  THERAPY DIAG:  Muscle weakness (generalized)  Other muscle spasm  Unspecified lack of coordination  Cramp and spasm  Rationale for Evaluation and Treatment: Rehabilitation  ONSET DATE: 2019  SUBJECTIVE:    Pt reports that she feels like she has been working on abdominal massage and cupping her abdomen. Fell asleep with a cup on her belly one night. Has been doing child's pose and pigeon pose, it has been amazing. Pt reports that she is stressed d/t schools being closed  Pt reports that she has been swimming a lot, her hamstrings are tight Saw her pain doctor yesterday Reports that she felt great after  DN last visit Feels 75% improved SUI is worse d/t not being as tense                                                                                                                                      SUBJECTIVE STATEMENT:  Fluid intake: Yes: mostly water, seltzer, liquid IV    PAIN:  Are you having pain? Yes NPRS scale: 7/10 Pain location: External  Pain type: throbbing, tight, and tingling Pain description: constant, stabbing if super tight  Aggravating factors: walking or running, being on her feet, sitting, standing Relieving factors: meditation, heat, stretching, does not know the right stretches, if she tightens her core  PRECAUTIONS: None  RED  FLAGS: None   WEIGHT BEARING RESTRICTIONS: No  FALLS:  Has patient fallen in last 6 months? No  LIVING ENVIRONMENT: Lives with: lives with their family Lives in: House/apartment Stairs: No Has following equipment at home: None  OCCUPATION: pharmacist- retail, very difficult right now  PLOF: Independent  PATIENT GOALS: to be able to relieve  pain without having to spend a ton of time on it, has 2 young kids and a job. Pain is a priority. Rare that the leaking happens.   PERTINENT HISTORY:   Sexual abuse: No  BOWEL MOVEMENT: Pain with bowel movement: Yes when stuff is super tight Type of bowel movement:Strain Yes sometimes Fully empty rectum: Yes: when she goes Leakage: No only when the hemorrhoid is really bad, it was crazy, it' better now Pads: No Fiber supplement: No  URINATION: Pain with urination: No Fully empty bladder: No sometimes feels like she struggles Stream: Weak Urgency: No, used to Frequency: not now Leakage: Coughing, Sneezing, and Exercise, running Pads: Yes:    INTERCOURSE: Pain with intercourse: Initial Penetration, During Penetration, After Intercourse, During Climax, and Pain Interrupts Intercourse Ability to have vaginal penetration:  Yes: has not in a while Climax: yes Marinoff Scale: 2/3  PREGNANCY: Vaginal deliveries 0 Tearing No C-section deliveries 2 Currently pregnant No  PROLAPSE: Check with internal   OBJECTIVE:  Note: Objective measures were completed at Evaluation unless otherwise noted. Very tight and tender abdominal scars Pt irritable Guarded gait Guarded and somewhat anxious     COGNITION: Overall cognitive status: Within functional limits for tasks assessed     SENSATION: Light touch: Appears intact Proprioception: Appears intact  MUSCLE LENGTH: Hamstrings: tight bilateral Thomas test: Right tight ; Left tight    POSTURE: rounded shoulders and forward head  PELVIC ALIGNMENT:  LUMBARAROM/PROM:  A/PROM  A/PROM  eval  Flexion limited  Extension limited  Right lateral flexion limited  Left lateral flexion   Right rotation   Left rotation    (Blank rows = not tested)  LOWER EXTREMITY ROM:  Passive ROM Right eval Left eval  Hip flexion limited limited  Hip extension    Hip abduction limited limited  Hip adduction    Hip internal rotation    Hip external rotation    Knee flexion    Knee extension    Ankle dorsiflexion    Ankle plantarflexion    Ankle inversion    Ankle eversion     (Blank rows = not tested)  LOWER EXTREMITY MMT: 4/5 grossly overall  PALPATION:   General  upper chest breathing and restrictions throughout abdomen                External Perineal Exam wfl                             Internal Pelvic Floor decreased strength, high tone, tight and tender throughout Patient confirms identification and approves PT to assess internal pelvic floor and treatment Yes  PELVIC MMT:   MMT eval  Vaginal 2/5  Internal Anal Sphincter 5/5  External Anal Sphincter 5/5  Puborectalis   Diastasis Recti Yes- 2 fingers above umbilicus  (Blank rows = not tested)        TONE: high  PROLAPSE: no TODAY'S TREATMENT:  DATE:   07/09/23 Neuroreed - Child's pose Manual- DN with E-stim- abdomen, belly button scar, abdominal massage, Lumbar paraspinals 8 mins There acts- review of her HEP an progress      Previous session Manual- umbilical scar release with deep breathing Lumbar paraspinals dry needling with e-stim    Neuro reed- child's pose, diaphragmatic breathing, reverse kegel, open books bilateral, pigeon      Manual- rectal pelvic floor muscle release- muscle energy Trial of dry needling bilateral rectus abdominis  Therapeutic exercises-child's pose, reverse kegel/ DB There acts- education on constipation management, wand,  exercise for stress reduction   Manual- internal pelvic floor muscles assessment, perineal massage performed and patient educated on   Therapeutic activities- education on lubricant, wand, practiced diaphragmatic breathing with thera band reed, reverse kegel on ball, rolled up towel   PATIENT EDUCATION:  Education details/ manual performed: scar massage and cupping Person educated: Patient Education method: Programmer, multimedia, Demonstration, and Handouts Education comprehension: verbalized understanding, returned demonstration, verbal cues required, tactile cues required, and needs further education  Trigger Point Dry-Needling  Treatment instructions: Expect mild to moderate muscle soreness. S/S of pneumothorax if dry needled over a lung field, and to seek immediate medical attention should they occur. Patient verbalized understanding of these instructions and education.  Patient Consent Given: Yes Education handout provided: Previously provided Muscles treated: lumbar paraspinals, umbilical scar Treatment response/outcome: Utilized skilled palpation to identify trigger points.  During dry needling able to palpate muscle twitch and muscle elongation   Skilled palpation and monitoring by PT during dry needling   HOME EXERCISE PROGRAM:   [email or web address]  ASSESSMENT:  CLINICAL IMPRESSION: Pt with TP s, abdomen and left paraspinals, tight abdominal scars, did well with education and manual. Discussed starting strengthening next visit for abs and back. Constipation is better, SUI is worse d/t not being as tense ( per pt)     OBJECTIVE IMPAIRMENTS: decreased activity tolerance, decreased coordination, decreased endurance, decreased mobility, decreased ROM, decreased strength, increased fascial restrictions, increased muscle spasms, impaired flexibility, and pain.   ACTIVITY LIMITATIONS: bending  PARTICIPATION LIMITATIONS: interpersonal relationship, occupation, and yard work  PERSONAL FACTORS:  Time since onset of injury/illness/exacerbation are also affecting patient's functional outcome.   REHAB POTENTIAL: Good  CLINICAL DECISION MAKING: Stable/uncomplicated  EVALUATION COMPLEXITY: Low   GOALS: Goals reviewed with patient? Yes  SHORT TERM GOALS: Target date: 06/10/2023    Pt will be independent with HEP.   Baseline: not  Goal status: not yet  2.  Pt will be able to run/workout for 30 minutes without leakage or discomfort  Baseline:   Goal status: leakage is worse because she is not as tense  3.  Pt will be independent with diaphragmatic breathing and down training activities in order to improve pelvic floor relaxation.  Baseline:  Goal status: INITIAL   LONG TERM GOALS: Target date: 08/05/2023    Pt will be independent with advanced HEP.   Baseline:  Goal status: INITIAL  2.  Pt will report decreased pelvic pain to max 3/10  Baseline:  Goal status: INITIAL  3.  Pt will report reduced pain abdominal scars to max 3/10 Baseline:  Goal status: improving  4.  Pt will soak 0 pads/ day Baseline:  Goal status: INITIAL   PLAN:  PT FREQUENCY: 1-2x/week  PT DURATION: 12 weeks  PLANNED INTERVENTIONS: Therapeutic exercises, Therapeutic activity, Neuromuscular re-education, Balance training, Gait training, Patient/Family education, Self Care, Joint mobilization, Dry Needling, Electrical stimulation, Spinal manipulation, Spinal mobilization, scar mobilization, Biofeedback,  and Manual therapy  PLAN FOR NEXT SESSION: core strengthening for SUI Addis Tuohy, PT 07/09/23 11:00 AM

## 2023-07-10 MED ORDER — LIDOCAINE 5 % EX PTCH: 3.0000 | MEDICATED_PATCH | CUTANEOUS | 0 refills | Status: DC

## 2023-07-12 ENCOUNTER — Ambulatory Visit: Payer: Managed Care, Other (non HMO) | Attending: Family Medicine | Admitting: Physical Therapy

## 2023-07-12 DIAGNOSIS — M542 Cervicalgia: Secondary | ICD-10-CM | POA: Diagnosis present

## 2023-07-12 DIAGNOSIS — M6281 Muscle weakness (generalized): Secondary | ICD-10-CM | POA: Diagnosis present

## 2023-07-12 DIAGNOSIS — M546 Pain in thoracic spine: Secondary | ICD-10-CM | POA: Insufficient documentation

## 2023-07-12 DIAGNOSIS — M62838 Other muscle spasm: Secondary | ICD-10-CM | POA: Diagnosis present

## 2023-07-12 NOTE — Therapy (Signed)
OUTPATIENT PHYSICAL THERAPY CERVICAL TREATMENT NOTE   Patient Name: Adrienne Williams MRN: [id number] DOB:12/19/1980, 42 y.o., female Today's Date: 07/12/2023  END OF SESSION:  PT End of Session - 07/12/23 0855     Visit Number 22    Date for PT Re-Evaluation 07/30/23    Authorization Type [employer]    PT Start Time 0850    PT Stop Time 0930    PT Time Calculation (min) 40 min    Activity Tolerance Patient tolerated treatment well              Past Medical History:  Diagnosis Date   Anxiety    Asthma    Depression    Family history of breast cancer    Homero Fellers breech presentation 05/29/2018   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   Hemorrhoids    Hx of varicella    PONV (postoperative nausea and vomiting)    "little nauseated"   Postpartum care following cesarean delivery (6/14) 01/18/2016   Past Surgical History:  Procedure Laterality Date   BREAST BIOPSY Right 02/25/2023   CESAREAN SECTION N/A 01/18/2016   Procedure: Primary CESAREAN SECTION;  Surgeon: Genia Del, MD;  Location: [facility];  Service: Obstetrics;  Laterality: N/A;  EDD: 01/25/16   CESAREAN SECTION N/A 05/29/2018   Procedure: Repeat CESAREAN SECTION;  Surgeon: Shea Evans, MD;  Location: [facility];  Service: Obstetrics;  Laterality: N/A;  EDD: 06/10/18   COLONOSCOPY     CYSTECTOMY  2002   pilonidal region   DIAGNOSTIC LAPAROSCOPY WITH REMOVAL OF ECTOPIC PREGNANCY Right 05/29/2020   Procedure: DIAGNOSTIC LAPAROSCOPY WITH REMOVAL OF ECTOPIC PREGNANCY;  Surgeon: Shea Evans, MD;  Location: [facility] OR;  Service: Gynecology;  Laterality: Right;   MOLE REMOVAL     UNILATERAL SALPINGECTOMY Right 05/29/2020   Procedure: UNILATERAL SALPINGECTOMY;  Surgeon: Shea Evans, MD;  Location: [facility] OR;  Service: Gynecology;  Laterality: Right;   WISDOM TOOTH EXTRACTION     Patient Active Problem List   Diagnosis Date Noted   Poor sleep 05/22/2023   Neck pain, chronic  06/27/2022   Fibromyalgia 06/27/2022   Myofascial pain 06/27/2022   Internal hemorrhoids 06/13/2022   Rectal bleeding 06/13/2022   Family history of breast cancer 01/18/2022   Monoallelic mutation of CHEK2 gene in female patient 01/18/2022   Anxiety and depression 05/31/2018   Rh negative, maternal 05/31/2018   Homero Fellers breech presentation 05/29/2018   Status post repeat low transverse cesarean section 05/29/2018   Postpartum care following cesarean delivery (10/24) 05/29/2018    PCP: Jarrett Soho, PA-C   REFERRING PROVIDER: Jackelyn Poling, DO  REFERRING DIAG: [phone] (ICD-10-CM) - Other muscle spasm  THERAPY DIAG:  Muscle weakness (generalized)  Other muscle spasm  Rationale for Evaluation and Treatment: Rehabilitation  ONSET DATE: 03/26/2023  SUBJECTIVE:  SUBJECTIVE STATEMENT: Going to [place].  Just smelling the chlorine on my bathing suit makes my shoulders relax.  Had some DN to my abdominals and it needed it.     EVAL: Patient reports she was helping her husband install garage seal on 03-10-23.  She felt severe pain in thoracic spine and neck onset around 03-13-23 and explains that she noticed that she had a lot of swelling and had to get a bigger bra and extender for her bra due to the swelling.  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  Difficulty with driving, sleeping, IADL's self care and working.  I have two kids and caring for them and the dogs has been very challenging.  I am usually very active but unable to do anything right now.  Hand dominance: Right  PERTINENT HISTORY:  na  PAIN:  PAIN:  Are you having pain? Yes NPRS scale:  4/10 chest tightness, shoulders Pain location: chest, low shoulder blades Aggravating factors:  stress; overstretching Relieving factors: DN   PRECAUTIONS: None  RED FLAGS: None     WEIGHT BEARING RESTRICTIONS: No  FALLS:  Has patient fallen in last 6 months? No  LIVING ENVIRONMENT: Lives with: lives with their family and lives with their spouse Lives in: House/apartment   OCCUPATION: Pharmacist  PLOF: Independent, Independent with basic ADLs, Independent with household mobility without device, Independent with community mobility without device, Independent with homemaking with ambulation, Independent with gait, and Independent with transfers  PATIENT GOALS: to eliminate pain and be able to work and do my usual daily activities and sleep without interruption The Patient-Specific Functional Scale  Initial:  I am going to ask you to identify up to 3 important activities that you are unable to do or are having difficulty with as a result of this problem.  Today are there any activities that you are unable to do or having difficulty with because of this?  (Patient shown scale and patient rated each activity)  Follow up: When you first came in you had difficulty performing these activities.  Today do you still have difficulty?  Patient-Specific activity scoring scheme (Point to one number):  0 1 2 3 4 5 6 7 8 9  10 Unable                                                                                                          Able to perform To perform  activity at the same Activity         Level as before                                                                                                                       Injury or problem  Activity       Stamina to work all day                                                                       10/15     5                   2.             Driving the kids to school and picking up                                    10/15             6                      3.            Doing extended housework                                                            10/15                4   4. Able to push the cart at [employer]    NEXT MD VISIT: 04/10/23  OBJECTIVE:   DIAGNOSTIC FINDINGS:  MRI showed small lesion 4 mm breast and had biopsy in July; normal but recommended another MRI and biopsy   PATIENT SURVEYS:  NDI 80% self disability score  NDI 10/15:  54%  NDI 11/26:   48%   COGNITION: Overall cognitive status: Within functional limits for tasks assessed  SENSATION: WFL  POSTURE: rounded shoulders  PALPATION: Tight bands and trigger points in R > L upper traps and rhomboids   CERVICAL ROM:   Active ROM A/PROM (deg) eval 10/15 11/26  Flexion WNL WNL WNL  Extension WNL WNL   Right lateral flexion WNL WNL   Left lateral flexion WNL WNL   Right rotation WNL WNL   Left rotation WNL WNL    (Blank rows = not tested)   UPPER EXTREMITY MMT: 5/5 throughout bilateral upper extremities but patient states that she "is going to be so  sore" from the muscle testing throughout the test.   TODAY'S TREATMENT:   12/6 Nu-Step L5 ( green machine) 5 min Prone press ups 5x  Prone over 1 pillow: hip extension 5x (added to HEP) Prone over 1 pillow: bil shoulder extension palms down (added to HEP) Prone over 1 pillow: bil shoulder horizontal abduction (added to HEP) Open books 5x right/left with leg on foam roll  Manual therapy to bil upper traps, periscapular musculature Trigger Point Dry-Needling  Treatment instructions: Expect mild to moderate muscle soreness. S/S of pneumothorax if dry needled over a lung field, and to seek immediate medical attention should they occur. Patient verbalized understanding of these instructions and education.  Patient Consent Given: Yes Education handout provided: Previously provided Muscles treated:  bil pecs, bil teres; bil upper traps;  bil levator scap Electrical stimulation  performed: No Parameters: N/A Treatment response/outcome: dec tender point size/number, twitch response and decreased muscle tension  Heat following DN 11/26 Nu-Step L3 ( blue machine) 5 min NDI Thread the needle green band with other hand in the chair 10x right/left  Standing 35# lat pulls 5x  3# alternating overhead press 10x Green loop wall lift offs 10x Manual therapy to bil upper traps, periscapular musculature Trigger Point Dry-Needling  Treatment instructions: Expect mild to moderate muscle soreness. S/S of pneumothorax if dry needled over a lung field, and to seek immediate medical attention should they occur. Patient verbalized understanding of these instructions and education.  Patient Consent Given: Yes Education handout provided: Previously provided Muscles treated:  bil upper traps;bil teres, bil levator scap; right rhomboids; right subscapularis; bil posterior deltoids Electrical stimulation performed: No Parameters: N/A Treatment response/outcome: dec tender point size/number, twitch response and decreased muscle tension  Heat following DN 11/22 Nu-Step L3 ( blue machine) 5 min Standing cable row 15# 12x (cue for abdominal draw in and exhale on the pull) Overhead press 2# alternating 10x Standing 30# lat pulls 10x  Planks on forearms on mat table 10 sec hold 3x Green loop steering wheel "drivers" 44W Green band single arm pull with opp hip flexion 10x right/left  Manual therapy to bil upper traps, periscapular musculature Trigger Point Dry-Needling  Treatment instructions: Expect mild to moderate muscle soreness. S/S of pneumothorax if dry needled over a lung field, and to seek immediate medical attention should they occur. Patient verbalized understanding of these instructions and education.  Patient Consent Given: Yes Education handout provided: Previously provided Muscles treated:  bil upper traps;bil teres, bil levator scap; right rhomboids; right subscapularis   Electrical stimulation performed: No Parameters: N/A Treatment response/outcome: dec tender point size/number, twitch response and decreased muscle tension  11/12 Nu-Step L5  ( green machine) 5 min Discussion of swimming and benefits for strengthening upper back [facility] Standing cable row 15# 12x Yellow band 90/90 external rotation and press overhead 5x, then 2x more Standing 30# lat pulls 10x; 35# 5x Green band single arm pull with opp hip flexion 10x right/left  Manual therapy to bil upper traps, neck musculature Trigger Point Dry-Needling  Treatment instructions: Expect mild to moderate muscle soreness. S/S of pneumothorax if dry needled over a lung field, and to seek immediate medical attention should they occur. Patient verbalized understanding of these instructions and education.  Patient Consent Given: Yes Education handout provided: Previously provided Muscles treated:  bil upper traps; right SCM; bil pectorals, bil subscapularis  Electrical stimulation performed: No Parameters: N/A Treatment response/outcome: dec tender point size/number, twitch response and decreased  muscle tension          PATIENT EDUCATION:  Education details:  HEP Person educated: Patient Education method: Programmer, multimedia, Facilities manager, Verbal cues, and Handouts Education comprehension: verbalized understanding, returned demonstration, and verbal cues required  HOME EXERCISE PROGRAM: Access Code: [email or web address] URL: [email or web address] Date: 07/12/2023 Prepared by: Lavinia Sharps  Exercises - Supine Chest Stretch on Foam Roll  - 1 x daily - 7 x weekly - 3 sets - 10 reps - Thoracic Extension Mobilization on Foam Roll  - 1 x daily - 7 x weekly - 3 sets - 10 reps - Supine Shoulder Horizontal Abduction with Resistance  - 1 x daily - 7 x weekly - 3 sets - 10 reps - Supine PNF D2 Flexion with Resistance  - 1 x daily - 7 x weekly - 3 sets - 10 reps - Latissimus Dorsi  Stretch at Wall  - 2 x daily - 7 x weekly - 1 sets - 2-3 reps - 20 hold - Prone Chest Stretch on Chair  - 1 x daily - 7 x weekly - 1 sets - 2-3 reps - 20 hold - Standing Thoracic Open Book at Wall  - 1 x daily - 7 x weekly - 1 sets - 5 reps - Standing Cervical Retraction  - 3-4 x daily - 7 x weekly - 1 sets - 5 reps - Wall Push Up  - 1 x daily - 7 x weekly - 1 sets - 10 reps - Wall Angels  - 1 x daily - 7 x weekly - 1 sets - 10 reps - Shoulder Overhead Press in Flexion with Dumbbells  - 1 x daily - 7 x weekly - 2 sets - 5 reps - Squat with Chair Touch  - 1 x daily - 7 x weekly - 2 sets - 5 reps - Half Deadlift with Kettlebell  - 1 x daily - 7 x weekly - 2 sets - 5 reps - Farmer's Carry with Kettlebells  - 1 x daily - 7 x weekly - 1 sets - 10 reps - Standing Hip Circles  - 1 x daily - 7 x weekly - 1 sets - 10 reps - Seated Assisted Cervical Rotation with Towel  - 1 x daily - 7 x weekly - 1 sets - 10 reps - Cervical Extension AROM with Strap  - 1 x daily - 7 x weekly - 1 sets - 10 reps - Supine Diaphragmatic Breathing  - 1 x daily - 7 x weekly - 1 sets - 10 reps - Doorway Pec Stretch at 90 Degrees Abduction  - 1 x daily - 7 x weekly - 2 sets - 2 reps - 20 hold - Doorway Pec Stretch at 120 Degrees Abduction  - 1 x daily - 7 x weekly - 2 sets - 2 reps - 20 hold - Standing Quadratus Lumborum Stretch with Doorway  - 1 x daily - 7 x weekly - 2 sets - 2 reps - 20 hold - Standing Lat Pull Down with Resistance - Elbows Bent  - 1 x daily - 7 x weekly - 1 sets - 10 reps - Shoulder extension with resistance - Neutral  - 1 x daily - 7 x weekly - 1 sets - 10 reps - Standing Plank on Wall  - 1 x daily - 7 x weekly - 1 sets - 5 reps - 5 hold - Standing Full Side Plank on Wall  - 1 x daily - 7 x weekly - 1  sets - 3 reps - 5 hold - Prone Hip Extension - One Pillow  - 1 x daily - 7 x weekly - 1 sets - 5 reps - Prone Shoulder Extension  - 1 x daily - 7 x weekly - 1 sets - 5 reps - Prone Scapular Retraction Arms at  Side  - 1 x daily - 7 x weekly - 1 sets - 5 reps -ASSESSMENT:  CLINICAL IMPRESSION: Treatment focus on spinal and scapular mobility and lengthening in limited repetitions to avoid overstretching.  Therapist progressing and updating HEP. She is compliant with her home exercise program and using numerous other pain strategies (home massager, infrared heat, meditation etc) to manage pain. She responds well to DN to address numerous tender points in upper quarter musculature.    OBJECTIVE IMPAIRMENTS: increased fascial restrictions and increased muscle spasms.   ACTIVITY LIMITATIONS: carrying, lifting, bending, sitting, squatting, sleeping, transfers, bed mobility, bathing, dressing, and hygiene/grooming  PARTICIPATION LIMITATIONS: meal prep, cleaning, laundry, interpersonal relationship, driving, shopping, community activity, and occupation  PERSONAL FACTORS: Education, Past/current experiences, and 3+ comorbidities: anxiety , depression and chronic pain  are also affecting patient's functional outcome.   REHAB POTENTIAL: Fair due to   CLINICAL DECISION MAKING: Unstable/unpredictable  EVALUATION COMPLEXITY: High   GOALS: Goals reviewed with patient? Yes  SHORT TERM GOALS: Target date: 04/24/2023   Patient to report pain no greater than 2/10  Baseline:  Goal status: ongoing  2.  Patient will be independent with initial HEP  Baseline:  Goal status: met 9/17    LONG TERM GOALS: Target date:07/30/2023   Patient to report an overall improvement at 60% with home and work ADLs Baseline:  Goal status: revised  2.  Patient to be independent with advanced HEP  Baseline:  Goal status:ongoing  3.  Patient to be able to sleep through the night  Baseline:  Goal status: ongoing 4.  Patient to be able to return to work Baseline:  Goal status: met 10/15  5.  Neck pain disablity score to improve by 5 Baseline:  Goal status: met 10/15  6.  Patient to report PSFS score of 6 with  doing housework for extended period of time Baseline:  Goal status: INITIAL 7.  Patient will report PSFS of 8 with driving kids to and from school New  8. PSFS of 6 with stamina to work a long shift new   PLAN:  PT FREQUENCY: 1x/week  PT DURATION: 10 weeks  PLANNED INTERVENTIONS: Therapeutic exercises, Therapeutic activity, Neuromuscular re-education, Balance training, Gait training, Patient/Family education, Self Care, Joint mobilization, Vestibular training, Canalith repositioning, Aquatic Therapy, Dry Needling, Electrical stimulation, Spinal mobilization, Cryotherapy, Moist heat, Taping, Vasopneumatic device, Traction, Ultrasound, Ionotophoresis 4mg /ml Dexamethasone, and Manual therapy  PLAN FOR NEXT SESSION:   steering wheels side/lateral wall planks; DN;  Nu-step, review HEP, progress postural strengthening including resistance training upper quarter  Lavinia Sharps, PT 07/12/23 12:27 PM Phone: [phone] Fax: [phone]

## 2023-07-15 ENCOUNTER — Encounter: Payer: Self-pay | Admitting: Physical Therapy

## 2023-07-15 ENCOUNTER — Ambulatory Visit: Payer: Managed Care, Other (non HMO) | Admitting: Physical Therapy

## 2023-07-15 DIAGNOSIS — R279 Unspecified lack of coordination: Secondary | ICD-10-CM

## 2023-07-15 DIAGNOSIS — M6281 Muscle weakness (generalized): Secondary | ICD-10-CM | POA: Diagnosis not present

## 2023-07-15 DIAGNOSIS — R252 Cramp and spasm: Secondary | ICD-10-CM

## 2023-07-15 DIAGNOSIS — M62838 Other muscle spasm: Secondary | ICD-10-CM

## 2023-07-15 NOTE — Therapy (Signed)
OUTPATIENT PHYSICAL THERAPY FEMALE PELVIC TREATMENT   Patient Name: Adrienne Williams MRN: [id number] DOB:[birth year], 42 y.o., female Today's Date: 07/15/2023  END OF SESSION:  PT End of Session - 07/15/23 1219     Visit Number 23    Date for PT Re-Evaluation 07/30/23    Authorization Type [employer]    PT Start Time 1145    PT Stop Time 1230    PT Time Calculation (min) 45 min    Activity Tolerance Patient tolerated treatment well    Behavior During Therapy [facility] for tasks assessed/performed                Past Medical History:  Diagnosis Date   Anxiety    Asthma    Depression    Family history of breast cancer    Frank breech presentation 05/29/2018   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   Hemorrhoids    Hx of varicella    PONV (postoperative nausea and vomiting)    "little nauseated"   Postpartum care following cesarean delivery (6/14) 01/18/2016   Past Surgical History:  Procedure Laterality Date   BREAST BIOPSY Right 02/25/2023   CESAREAN SECTION N/A 01/18/2016   Procedure: Primary CESAREAN SECTION;  Surgeon: Genia Del, MD;  Location: [facility];  Service: Obstetrics;  Laterality: N/A;  EDD: 01/25/16   CESAREAN SECTION N/A 05/29/2018   Procedure: Repeat CESAREAN SECTION;  Surgeon: Shea Evans, MD;  Location: [facility];  Service: Obstetrics;  Laterality: N/A;  EDD: 06/10/18   COLONOSCOPY     CYSTECTOMY  2002   pilonidal region   DIAGNOSTIC LAPAROSCOPY WITH REMOVAL OF ECTOPIC PREGNANCY Right 05/29/2020   Procedure: DIAGNOSTIC LAPAROSCOPY WITH REMOVAL OF ECTOPIC PREGNANCY;  Surgeon: Shea Evans, MD;  Location: [facility] OR;  Service: Gynecology;  Laterality: Right;   MOLE REMOVAL     UNILATERAL SALPINGECTOMY Right 05/29/2020   Procedure: UNILATERAL SALPINGECTOMY;  Surgeon: Shea Evans, MD;  Location: [facility] OR;  Service: Gynecology;  Laterality: Right;   WISDOM TOOTH EXTRACTION     Patient Active Problem List   Diagnosis  Date Noted   Poor sleep 05/22/2023   Neck pain, chronic 06/27/2022   Fibromyalgia 06/27/2022   Myofascial pain 06/27/2022   Internal hemorrhoids 06/13/2022   Rectal bleeding 06/13/2022   Family history of breast cancer 01/18/2022   Monoallelic mutation of CHEK2 gene in female patient 01/18/2022   Anxiety and depression 05/31/2018   Rh negative, maternal 05/31/2018   Homero Fellers breech presentation 05/29/2018   Status post repeat low transverse cesarean section 05/29/2018   Postpartum care following cesarean delivery (10/24) 05/29/2018    PCP: Jarrett Soho, PA-C PCP - General  REFERRING PROVIDER: Vick Frees, MD Ref Provider  REFERRING DIAG: N39.3 (ICD-10-CM) - Stress incontinence (female) (female)  THERAPY DIAG:  Muscle weakness (generalized)  Other muscle spasm  Unspecified lack of coordination  Cramp and spasm  Rationale for Evaluation and Treatment: Rehabilitation  ONSET DATE: 2019  SUBJECTIVE:   Pt reports that stress urinary incontinence has  been worse since she is not as tense anymore. Just got a massage this morning, feels amazing. Was a little sore after belly dry needling last week, felt great after that. She gets stressed at work and forgets to breathe, but it's getting better because she knows what to do now to relieve tension in her abdomen.  She does not know when to stop with exercise, can end up doing too much and  be in too much pain. Uses exercise as a stress reliever.                                                                                                                                      SUBJECTIVE STATEMENT:  Fluid intake: Yes: mostly water, seltzer, liquid IV    PAIN:  Are you having pain? Yes NPRS scale: 3/10 Pain location: External  Pain type: throbbing, tight, and tingling Pain description: constant, stabbing if super tight  Aggravating factors: walking or running, being on her feet, sitting, standing Relieving factors:  diaphragmatic breathing, lidocaine patches PRECAUTIONS: None  RED FLAGS: None   WEIGHT BEARING RESTRICTIONS: No  FALLS:  Has patient fallen in last 6 months? No  LIVING ENVIRONMENT: Lives with: lives with their family Lives in: House/apartment Stairs: No Has following equipment at home: None  OCCUPATION: pharmacist- retail, very difficult right now  PLOF: Independent  PATIENT GOALS: to be able to relieve pain without having to spend a ton of time on it, has 2 young kids and a job. Pain is a priority. Rare that the leaking happens.   PERTINENT HISTORY:   Sexual abuse: No  BOWEL MOVEMENT: Pain with bowel movement: Yes when stuff is super tight Type of bowel movement:Strain Yes sometimes Fully empty rectum: Yes: when she goes Leakage: No only when the hemorrhoid is really bad, it was crazy, it' better now Pads: No Fiber supplement: No  URINATION: Pain with urination: No Fully empty bladder: No sometimes feels like she struggles Stream: Weak Urgency: No, used to Frequency: not now Leakage: Coughing, Sneezing, and Exercise, running Pads: Yes:    INTERCOURSE: Pain with intercourse: Initial Penetration, During Penetration, After Intercourse, During Climax, and Pain Interrupts Intercourse Ability to have vaginal penetration:  Yes: has not in a while Climax: yes Marinoff Scale: 2/3  PREGNANCY: Vaginal deliveries 0 Tearing No C-section deliveries 2 Currently pregnant No  PROLAPSE: Check with internal   OBJECTIVE:  Note: Objective measures were completed at Evaluation unless otherwise noted. Very tight and tender abdominal scars Pt irritable Guarded gait Guarded and somewhat anxious     COGNITION: Overall cognitive status: Within functional limits for tasks assessed     SENSATION: Light touch: Appears intact Proprioception: Appears intact  MUSCLE LENGTH: Hamstrings: tight bilateral Thomas test: Right tight ; Left tight    POSTURE: rounded  shoulders and forward head  PELVIC ALIGNMENT:  LUMBARAROM/PROM:  A/PROM A/PROM  eval  Flexion limited  Extension limited  Right lateral flexion limited  Left lateral flexion   Right rotation   Left rotation    (Blank rows = not tested)  LOWER EXTREMITY ROM:  Passive ROM Right eval Left eval  Hip flexion limited limited  Hip extension    Hip abduction limited limited  Hip adduction    Hip internal rotation    Hip external rotation    Knee flexion  Knee extension    Ankle dorsiflexion    Ankle plantarflexion    Ankle inversion    Ankle eversion     (Blank rows = not tested)  LOWER EXTREMITY MMT: 4/5 grossly overall  PALPATION:   General  upper chest breathing and restrictions throughout abdomen                External Perineal Exam wfl                             Internal Pelvic Floor decreased strength, high tone, tight and tender throughout Patient confirms identification and approves PT to assess internal pelvic floor and treatment Yes  PELVIC MMT:   MMT eval  Vaginal 2/5  Internal Anal Sphincter 5/5  External Anal Sphincter 5/5  Puborectalis   Diastasis Recti Yes- 1 finger above umbilicus  (Blank rows = not tested)        TONE: high  PROLAPSE: no TODAY'S TREATMENT:                                                                                                                              DATE:  07/15/2023 Neuro reed- ball press with transverse abdominis breath- to tolerance  Horizontal abduction with thera band with transverse abdominis breath (red ) Seated ball press with transverse abdominis breath to tolerance- unilat Diaphragmatic breathing   Manual- dry needling bilateral rectus abdominis, soft tissue release for DRA ( starting at thoracolumbar fascia)    PATIENT EDUCATION:  Education details/ manual performed: scar massage and cupping Person educated: Patient Education method: Programmer, multimedia, Demonstration, and Handouts Education  comprehension: verbalized understanding, returned demonstration, verbal cues required, tactile cues required, and needs further education  Trigger Point Dry-Needling  Treatment instructions: Expect mild to moderate muscle soreness. S/S of pneumothorax if dry needled over a lung field, and to seek immediate medical attention should they occur. Patient verbalized understanding of these instructions and education.  Patient Consent Given: Yes Education handout provided: Previously provided Muscles treated: lumbar paraspinals, umbilical scar Treatment response/outcome: Utilized skilled palpation to identify trigger points.  During dry needling able to palpate muscle twitch and muscle elongation   Skilled palpation and monitoring by PT during dry needling   HOME EXERCISE PROGRAM:   [id number]  ASSESSMENT:  CLINICAL IMPRESSION: Pt did well with dry needling abdominal muscles, soft tissue release for DRA and dry needling. Pt ready to address SUI and abdominal diastasis with core strengthening. She presents with 1 finger diastasis above umbilicus. Did fairly well with her new exercises, reps to tolerance so she does not aggravate her pain.    OBJECTIVE IMPAIRMENTS: decreased activity tolerance, decreased coordination, decreased endurance, decreased mobility, decreased ROM, decreased strength, increased fascial restrictions, increased muscle spasms, impaired flexibility, and pain.   ACTIVITY LIMITATIONS: bending  PARTICIPATION LIMITATIONS: interpersonal relationship, occupation, and yard work  PERSONAL FACTORS: Time since onset of injury/illness/exacerbation are also affecting  patient's functional outcome.   REHAB POTENTIAL: Good  CLINICAL DECISION MAKING: Stable/uncomplicated  EVALUATION COMPLEXITY: Low   GOALS: Goals reviewed with patient? Yes  SHORT TERM GOALS: Target date: 06/10/2023    Pt will be independent with HEP.   Baseline: not  Goal status: not yet  2.  Pt will be able to  run/workout for 30 minutes without leakage or discomfort  Baseline:   Goal status: leakage is worse because she is not as tense  3.  Pt will be independent with diaphragmatic breathing and down training activities in order to improve pelvic floor relaxation.  Baseline: no Goal status: progressing  LONG TERM GOALS: Target date: 08/05/2023    Pt will be independent with advanced HEP.   Baseline: not yet Goal status: progressing  2.  Pt will report decreased pelvic pain to max 3/10  Baseline:  Goal status: INITIAL  3.  Pt will report reduced pain abdominal scars to max 3/10 Baseline:  Goal status: improving  4.  Pt will soak 0 pads/ day Baseline:  Goal status: INITIAL   PLAN:  PT FREQUENCY: 1-2x/week  PT DURATION: 12 weeks  PLANNED INTERVENTIONS: Therapeutic exercises, Therapeutic activity, Neuromuscular re-education, Balance training, Gait training, Patient/Family education, Self Care, Joint mobilization, Dry Needling, Electrical stimulation, Spinal manipulation, Spinal mobilization, scar mobilization, Biofeedback, and Manual therapy  PLAN FOR NEXT SESSION: core strengthening for SUI, constipation strategies Coulter Oldaker, PT 07/15/23 12:54 PM

## 2023-07-15 NOTE — Therapy (Deleted)
OUTPATIENT PHYSICAL THERAPY CERVICAL TREATMENT NOTE   Patient Name: Adrienne Williams MRN: [id number] DOB:[birth year], 42 y.o., female Today's Date: 07/15/2023  END OF SESSION:       Past Medical History:  Diagnosis Date   Anxiety    Asthma    Depression    Family history of breast cancer    Homero Fellers breech presentation 05/29/2018   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   Hemorrhoids    Hx of varicella    PONV (postoperative nausea and vomiting)    "little nauseated"   Postpartum care following cesarean delivery (6/14) 01/18/2016   Past Surgical History:  Procedure Laterality Date   BREAST BIOPSY Right 02/25/2023   CESAREAN SECTION N/A 01/18/2016   Procedure: Primary CESAREAN SECTION;  Surgeon: Genia Del, MD;  Location: [facility];  Service: Obstetrics;  Laterality: N/A;  EDD: 01/25/16   CESAREAN SECTION N/A 05/29/2018   Procedure: Repeat CESAREAN SECTION;  Surgeon: Shea Evans, MD;  Location: [facility];  Service: Obstetrics;  Laterality: N/A;  EDD: 06/10/18   COLONOSCOPY     CYSTECTOMY  2002   pilonidal region   DIAGNOSTIC LAPAROSCOPY WITH REMOVAL OF ECTOPIC PREGNANCY Right 05/29/2020   Procedure: DIAGNOSTIC LAPAROSCOPY WITH REMOVAL OF ECTOPIC PREGNANCY;  Surgeon: Shea Evans, MD;  Location: [facility] OR;  Service: Gynecology;  Laterality: Right;   MOLE REMOVAL     UNILATERAL SALPINGECTOMY Right 05/29/2020   Procedure: UNILATERAL SALPINGECTOMY;  Surgeon: Shea Evans, MD;  Location: [facility] OR;  Service: Gynecology;  Laterality: Right;   WISDOM TOOTH EXTRACTION     Patient Active Problem List   Diagnosis Date Noted   Poor sleep 05/22/2023   Neck pain, chronic 06/27/2022   Fibromyalgia 06/27/2022   Myofascial pain 06/27/2022   Internal hemorrhoids 06/13/2022   Rectal bleeding 06/13/2022   Family history of breast cancer 01/18/2022   Monoallelic mutation of CHEK2 gene in female patient 01/18/2022   Anxiety and depression  05/31/2018   Rh negative, maternal 05/31/2018   Homero Fellers breech presentation 05/29/2018   Status post repeat low transverse cesarean section 05/29/2018   Postpartum care following cesarean delivery (10/24) 05/29/2018    PCP: Jarrett Soho, PA-C   REFERRING PROVIDER: Jackelyn Poling, DO  REFERRING DIAG: [phone] (ICD-10-CM) - Other muscle spasm  THERAPY DIAG:  No diagnosis found.  Rationale for Evaluation and Treatment: Rehabilitation  ONSET DATE: 03/26/2023  SUBJECTIVE:  SUBJECTIVE STATEMENT: I started swimming.  It's going well.  I wanted to talk to you about what I'm working there.  Weekend chest tightness. I only got 4 1/2 hours of sleep.   EVAL: Patient reports she was helping her husband install garage seal on 03-10-23.  She felt severe pain in thoracic spine and neck onset around 03-13-23 and explains that she noticed that she had a lot of swelling and had to get a bigger bra and extender for her bra due to the swelling.  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  Difficulty with driving, sleeping, IADL's self care and working.  I have two kids and caring for them and the dogs has been very challenging.  I am usually very active but unable to do anything right now.  Hand dominance: Right  PERTINENT HISTORY:  na  PAIN:  PAIN:  Are you having pain? Yes NPRS scale:  3-4/10 Pain location: chest, low shoulder blades Aggravating factors: stress; overstretching Relieving factors: DN   PRECAUTIONS: None  RED FLAGS: None     WEIGHT BEARING RESTRICTIONS: No  FALLS:  Has patient fallen in last 6 months? No  LIVING ENVIRONMENT: Lives with: lives with their family and lives with their spouse Lives in: House/apartment   OCCUPATION:  Pharmacist  PLOF: Independent, Independent with basic ADLs, Independent with household mobility without device, Independent with community mobility without device, Independent with homemaking with ambulation, Independent with gait, and Independent with transfers  PATIENT GOALS: to eliminate pain and be able to work and do my usual daily activities and sleep without interruption The Patient-Specific Functional Scale  Initial:  I am going to ask you to identify up to 3 important activities that you are unable to do or are having difficulty with as a result of this problem.  Today are there any activities that you are unable to do or having difficulty with because of this?  (Patient shown scale and patient rated each activity)  Follow up: When you first came in you had difficulty performing these activities.  Today do you still have difficulty?  Patient-Specific activity scoring scheme (Point to one number):  0 1 2 3 4 5 6 7 8 9  10 Unable                                                                                                          Able to perform To perform                                                                                                    activity  at the same Activity         Level as before                                                                                                                       Injury or problem  Activity       Stamina to work all day                                                                       10/15     5                   2.             Driving the kids to school and picking up                                    10/15            6                      3.            Doing extended housework                                                            10/15                4   4. Able to push the cart at [employer]    NEXT MD VISIT: 04/10/23  OBJECTIVE:   DIAGNOSTIC FINDINGS:  MRI showed small lesion 4 mm breast and had biopsy in  July; normal but recommended another MRI and biopsy   PATIENT SURVEYS:  NDI 80% self disability score  NDI 10/15:  54%  COGNITION: Overall cognitive status: Within functional limits for tasks assessed  SENSATION: WFL  POSTURE: rounded shoulders  PALPATION: Tight bands and trigger points in R > L upper traps and rhomboids   CERVICAL ROM:   Active ROM A/PROM (deg) eval 10/15  Flexion WNL WNL  Extension WNL WNL  Right lateral flexion WNL WNL  Left lateral flexion WNL WNL  Right rotation WNL WNL  Left rotation WNL WNL   (Blank rows = not tested)   UPPER EXTREMITY MMT: 5/5 throughout bilateral upper extremities but patient states that she "is going to be so sore" from the muscle testing throughout the test.   TODAY'S TREATMENT:   1/12  Nu-Step L5  ( green machine) 5 min Discussion of swimming and benefits for strengthening upper back [facility] cable row 15# 12x Yellow band 90/90 external rotation and press overhead 5x, then 2x more Standing 30# lat pulls 10x; 35# 5x Green band single arm pull with opp hip flexion 10x right/left  Manual therapy to bil upper traps, neck musculature Trigger Point Dry-Needling  Treatment instructions: Expect mild to moderate muscle soreness. S/S of pneumothorax if dry needled over a lung field, and to seek immediate medical attention should they occur. Patient verbalized understanding of these instructions and education.  Patient Consent Given: Yes Education handout provided: Previously provided Muscles treated:  bil upper traps; right SCM; bil pectorals, bil subscapularis  Electrical stimulation performed: No Parameters: N/A Treatment response/outcome: dec tender point size/number, twitch response and decreased muscle tension   10/29 Nu-Step L3  ( blue machine) 5 min Seated peanut ball to lengthen trunk musculature 5x right/left Sidelying pelvic oscillations using peanut ball Open books with peanut ball  10x  Recorded pt doing the peanut ball ex's with her phone since they are not on [employer] (pt interested in getting a peanut for home) Standing cable row 15# 10x Standing 30# lat pulls 15x   Manual therapy to bil upper traps, scapular musculature Trigger Point Dry-Needling  Treatment instructions: Expect mild to moderate muscle soreness. S/S of pneumothorax if dry needled over a lung field, and to seek immediate medical attention should they occur. Patient verbalized understanding of these instructions and education.  Patient Consent Given: Yes Education handout provided: Previously provided Muscles treated: bil teres, bil lats; bil upper traps and levator scap muscles Electrical stimulation performed: No Parameters: N/A Treatment response/outcome: dec tender point size/number, twitch response and decreased muscle tension  Heat 10 min     10/22 Nu-Step L3  ( blue machine) 5 min Standing cable row 15# 8x Wall lift offs with green loop 4x  (very challenging) Squats with 10# KB 10x Farmer's carry hold with BOSU alternating step taps 10x right/left sides Standing 30# lat pulls 12x   Manual therapy to bil upper traps cervical musculature Trigger Point Dry-Needling  Treatment instructions: Expect mild to moderate muscle soreness. S/S of pneumothorax if dry needled over a lung field, and to seek immediate medical attention should they occur. Patient verbalized understanding of these instructions and education.  Patient Consent Given: Yes Education handout provided: Previously provided Muscles treated: bil teres, bil lats; bil upper trap (ant and post approach); bil pecs Electrical stimulation performed: No Parameters: N/A Treatment response/outcome: dec tender point size/number, twitch response and decreased muscle tension  Heat 2 min            PATIENT EDUCATION:  Education details:  HEP Person educated: Patient Education method: Programmer, multimedia, Facilities manager, Verbal cues, and  Handouts Education comprehension: verbalized understanding, returned demonstration, and verbal cues required  HOME EXERCISE PROGRAM: Access Code: [email or web address] URL: [email or web address] Date: 05/14/2023 Prepared by: Lavinia Sharps  Exercises - Supine Chest Stretch on Foam Roll  - 1 x daily - 7 x weekly - 3 sets - 10 reps - Thoracic Extension Mobilization on Foam Roll  - 1 x daily - 7 x weekly - 3 sets - 10 reps - Supine Shoulder Horizontal Abduction with Resistance  - 1 x daily - 7 x weekly - 3 sets - 10 reps - Supine PNF D2 Flexion with Resistance  - 1 x daily - 7 x weekly - 3 sets - 10 reps -  Latissimus Dorsi Stretch at Wall  - 2 x daily - 7 x weekly - 1 sets - 2-3 reps - 20 hold - Prone Chest Stretch on Chair  - 1 x daily - 7 x weekly - 1 sets - 2-3 reps - 20 hold - Standing Thoracic Open Book at Wall  - 1 x daily - 7 x weekly - 1 sets - 5 reps - Standing Cervical Retraction  - 3-4 x daily - 7 x weekly - 1 sets - 5 reps - Wall Push Up  - 1 x daily - 7 x weekly - 1 sets - 10 reps - Wall Angels  - 1 x daily - 7 x weekly - 1 sets - 10 reps - Shoulder Overhead Press in Flexion with Dumbbells  - 1 x daily - 7 x weekly - 2 sets - 5 reps - Squat with Chair Touch  - 1 x daily - 7 x weekly - 2 sets - 5 reps - Half Deadlift with Kettlebell  - 1 x daily - 7 x weekly - 2 sets - 5 reps - Farmer's Carry with Kettlebells  - 1 x daily - 7 x weekly - 1 sets - 10 reps - Standing Hip Circles  - 1 x daily - 7 x weekly - 1 sets - 10 reps - Seated Assisted Cervical Rotation with Towel  - 1 x daily - 7 x weekly - 1 sets - 10 reps - Cervical Extension AROM with Strap  - 1 x daily - 7 x weekly - 1 sets - 10 reps - Supine Diaphragmatic Breathing  - 1 x daily - 7 x weekly - 1 sets - 10 reps - Doorway Pec Stretch at 90 Degrees Abduction  - 1 x daily - 7 x weekly - 2 sets - 2 reps - 20 hold - Doorway Pec Stretch at 120 Degrees Abduction  - 1 x daily - 7 x weekly - 2 sets - 2 reps - 20 hold - Standing  Quadratus Lumborum Stretch with Doorway  - 1 x daily - 7 x weekly - 2 sets - 2 reps - 20 hold - Standing Lat Pull Down with Resistance - Elbows Bent  - 1 x daily - 7 x weekly - 1 sets - 10 reps - Shoulder extension with resistance - Neutral  - 1 x daily - 7 x weekly - 1 sets - 10 reps - Standing Plank on Wall  - 1 x daily - 7 x weekly - 1 sets - 5 reps - 5 hold - Standing Full Side Plank on Wall  - 1 x daily - 7 x weekly - 1 sets - 3 reps - 5 hold -ASSESSMENT:  CLINICAL IMPRESSION: Able to continue with a strengthening progression emphasizing posterior chain muscles.  Therapist providing verbal cues to optimize technique with  exercises in order to achieve the greatest benefit. The patient benefits significantly from dry needling and manual therapy to stimulate underlying myofascial trigger points and muscular tissue for management of neuromusculoskeletal pain with much improved soft tissue mobility  following treatment session.      OBJECTIVE IMPAIRMENTS: increased fascial restrictions and increased muscle spasms.   ACTIVITY LIMITATIONS: carrying, lifting, bending, sitting, squatting, sleeping, transfers, bed mobility, bathing, dressing, and hygiene/grooming  PARTICIPATION LIMITATIONS: meal prep, cleaning, laundry, interpersonal relationship, driving, shopping, community activity, and occupation  PERSONAL FACTORS: Education, Past/current experiences, and 3+ comorbidities: anxiety , depression and chronic pain  are also affecting patient's functional outcome.   REHAB  POTENTIAL: Fair due to   CLINICAL DECISION MAKING: Unstable/unpredictable  EVALUATION COMPLEXITY: High   GOALS: Goals reviewed with patient? Yes  SHORT TERM GOALS: Target date: 04/24/2023   Patient to report pain no greater than 2/10  Baseline:  Goal status: ongoing  2.  Patient will be independent with initial HEP  Baseline:  Goal status: met 9/17    LONG TERM GOALS: Target date:07/30/2023   Patient to report  an overall improvement at 60% with home and work ADLs Baseline:  Goal status: revised  2.  Patient to be independent with advanced HEP  Baseline:  Goal status:ongoing  3.  Patient to be able to sleep through the night  Baseline:  Goal status: ongoing 4.  Patient to be able to return to work Baseline:  Goal status: met 10/15  5.  Neck pain disablity score to improve by 5 Baseline:  Goal status: met 10/15  6.  Patient to report PSFS score of 6 with doing housework for extended period of time Baseline:  Goal status: INITIAL 7.  Patient will report PSFS of 8 with driving kids to and from school New  8. PSFS of 6 with stamina to work a long shift new   PLAN:  PT FREQUENCY: 1x/week  PT DURATION: 10 weeks  PLANNED INTERVENTIONS: Therapeutic exercises, Therapeutic activity, Neuromuscular re-education, Balance training, Gait training, Patient/Family education, Self Care, Joint mobilization, Vestibular training, Canalith repositioning, Aquatic Therapy, Dry Needling, Electrical stimulation, Spinal mobilization, Cryotherapy, Moist heat, Taping, Vasopneumatic device, Traction, Ultrasound, Ionotophoresis 4mg /ml Dexamethasone, and Manual therapy  PLAN FOR NEXT SESSION:  steering wheels side/lateral wall planks; DN;  Nu-step, review HEP, progress postural strengthening including resistance training upper quarter  Lavinia Sharps, PT 07/15/23 11:49 AM Phone: [phone] Fax: [phone]                                 OUTPATIENT PHYSICAL THERAPY FEMALE PELVIC TREATMENT   Patient Name: Adrienne Williams MRN: [id number] DOB:[birth year], 42 y.o., female Today's Date: 07/15/2023  END OF SESSION:       Past Medical History:  Diagnosis Date   Anxiety    Asthma    Depression    Family history of breast cancer    Homero Fellers breech presentation 05/29/2018   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   Hemorrhoids    Hx of varicella    PONV  (postoperative nausea and vomiting)    "little nauseated"   Postpartum care following cesarean delivery (6/14) 01/18/2016   Past Surgical History:  Procedure Laterality Date   BREAST BIOPSY Right 02/25/2023   CESAREAN SECTION N/A 01/18/2016   Procedure: Primary CESAREAN SECTION;  Surgeon: Genia Del, MD;  Location: [facility];  Service: Obstetrics;  Laterality: N/A;  EDD: 01/25/16   CESAREAN SECTION N/A 05/29/2018   Procedure: Repeat CESAREAN SECTION;  Surgeon: Shea Evans, MD;  Location: [facility];  Service: Obstetrics;  Laterality: N/A;  EDD: 06/10/18   COLONOSCOPY     CYSTECTOMY  2002   pilonidal region   DIAGNOSTIC LAPAROSCOPY WITH REMOVAL OF ECTOPIC PREGNANCY Right 05/29/2020   Procedure: DIAGNOSTIC LAPAROSCOPY WITH REMOVAL OF ECTOPIC PREGNANCY;  Surgeon: Shea Evans, MD;  Location: [facility] OR;  Service: Gynecology;  Laterality: Right;   MOLE REMOVAL     UNILATERAL SALPINGECTOMY Right 05/29/2020   Procedure: UNILATERAL SALPINGECTOMY;  Surgeon: Shea Evans, MD;  Location: [facility] OR;  Service: Gynecology;  Laterality:  Right;   WISDOM TOOTH EXTRACTION     Patient Active Problem List   Diagnosis Date Noted   Poor sleep 05/22/2023   Neck pain, chronic 06/27/2022   Fibromyalgia 06/27/2022   Myofascial pain 06/27/2022   Internal hemorrhoids 06/13/2022   Rectal bleeding 06/13/2022   Family history of breast cancer 01/18/2022   Monoallelic mutation of CHEK2 gene in female patient 01/18/2022   Anxiety and depression 05/31/2018   Rh negative, maternal 05/31/2018   Homero Fellers breech presentation 05/29/2018   Status post repeat low transverse cesarean section 05/29/2018   Postpartum care following cesarean delivery (10/24) 05/29/2018    PCP: Jarrett Soho, PA-C PCP - General  REFERRING PROVIDER: Vick Frees, MD Ref Provider  REFERRING DIAG: N39.3 (ICD-10-CM) - Stress incontinence (female) (female)  THERAPY DIAG:  No diagnosis found.  Rationale for  Evaluation and Treatment: Rehabilitation  ONSET DATE: 2019  SUBJECTIVE:    Pt reported that she was sore after dry needling last visit, after  that felt so much better. Got massage this morning, feels good now.  Reports that she needs strengthening, she can see that.  Reports that she feels tension of her abdomen when she gets stressed. SUI has been worse since since she has not been as tight.                                                                                                                                 SUBJECTIVE STATEMENT:  Fluid intake: Yes: mostly water, seltzer, liquid IV    PAIN:  Are you having pain? Yes NPRS scale: 7/10 Pain location: External  Pain type: throbbing, tight, and tingling Pain description: constant, stabbing if super tight  Aggravating factors: walking or running, being on her feet, sitting, standing Relieving factors: meditation, heat, stretching, does not know the right stretches, if she tightens her core  PRECAUTIONS: None  RED FLAGS: None   WEIGHT BEARING RESTRICTIONS: No  FALLS:  Has patient fallen in last 6 months? No  LIVING ENVIRONMENT: Lives with: lives with their family Lives in: House/apartment Stairs: No Has following equipment at home: None  OCCUPATION: pharmacist- retail, very difficult right now  PLOF: Independent  PATIENT GOALS: to be able to relieve pain without having to spend a ton of time on it, has 2 young kids and a job. Pain is a priority. Rare that the leaking happens.   PERTINENT HISTORY:   Sexual abuse: No  BOWEL MOVEMENT: Pain with bowel movement: Yes when stuff is super tight Type of bowel movement:Strain Yes sometimes Fully empty rectum: Yes: when she goes Leakage: No only when the hemorrhoid is really bad, it was crazy, it' better now Pads: No Fiber supplement: No  URINATION: Pain with urination: No Fully empty bladder: No sometimes feels like she struggles Stream: Weak Urgency: No,  used to Frequency: not now Leakage: Coughing, Sneezing, and Exercise, running Pads: Yes:    INTERCOURSE: Pain with intercourse:  Initial Penetration, During Penetration, After Intercourse, During Climax, and Pain Interrupts Intercourse Ability to have vaginal penetration:  Yes: has not in a while Climax: yes Marinoff Scale: 2/3  PREGNANCY: Vaginal deliveries 0 Tearing No C-section deliveries 2 Currently pregnant No  PROLAPSE: Check with internal   OBJECTIVE:  Note: Objective measures were completed at Evaluation unless otherwise noted. Very tight and tender abdominal scars Pt irritable Guarded gait Guarded and somewhat anxious     COGNITION: Overall cognitive status: Within functional limits for tasks assessed     SENSATION: Light touch: Appears intact Proprioception: Appears intact  MUSCLE LENGTH: Hamstrings: tight bilateral Thomas test: Right tight ; Left tight    POSTURE: rounded shoulders and forward head  PELVIC ALIGNMENT:  LUMBARAROM/PROM:  A/PROM A/PROM  eval  Flexion limited  Extension limited  Right lateral flexion limited  Left lateral flexion   Right rotation   Left rotation    (Blank rows = not tested)  LOWER EXTREMITY ROM:  Passive ROM Right eval Left eval  Hip flexion limited limited  Hip extension    Hip abduction limited limited  Hip adduction    Hip internal rotation    Hip external rotation    Knee flexion    Knee extension    Ankle dorsiflexion    Ankle plantarflexion    Ankle inversion    Ankle eversion     (Blank rows = not tested)  LOWER EXTREMITY MMT: 4/5 grossly overall  PALPATION:   General  upper chest breathing and restrictions throughout abdomen                External Perineal Exam wfl                             Internal Pelvic Floor decreased strength, high tone, tight and tender throughout Patient confirms identification and approves PT to assess internal pelvic floor and treatment Yes  PELVIC MMT:    MMT eval  Vaginal 2/5  Internal Anal Sphincter 5/5  External Anal Sphincter 5/5  Puborectalis   Diastasis Recti Yes- 2 fingers above umbilicus  (Blank rows = not tested)        TONE: high  PROLAPSE: no TODAY'S TREATMENT:                                                                                                                              DATE:  07/15/2023  07/09/23 Neuroreed - Child's pose Manual- DN with E-stim- abdomen, belly button scar, abdominal massage, Lumbar paraspinals 8 mins There acts- review of her HEP an progress    PATIENT EDUCATION:  Education details/ manual performed: scar massage and cupping Person educated: Patient Education method: Programmer, multimedia, Demonstration, and Handouts Education comprehension: verbalized understanding, returned demonstration, verbal cues required, tactile cues required, and needs further education  Trigger Point Dry-Needling  Treatment instructions: Expect mild to moderate muscle soreness. S/S of pneumothorax if  dry needled over a lung field, and to seek immediate medical attention should they occur. Patient verbalized understanding of these instructions and education.  Patient Consent Given: Yes Education handout provided: Previously provided Muscles treated: lumbar paraspinals, umbilical scar Treatment response/outcome: Utilized skilled palpation to identify trigger points.  During dry needling able to palpate muscle twitch and muscle elongation   Skilled palpation and monitoring by PT during dry needling   HOME EXERCISE PROGRAM:   [email or web address]  ASSESSMENT:  CLINICAL IMPRESSION: Pt with TP s, abdomen and left paraspinals, tight abdominal scars, did well with education and manual. Discussed starting strengthening next visit for abs and back. Constipation is better, SUI is worse d/t not being as tense ( per pt)     OBJECTIVE IMPAIRMENTS: decreased activity tolerance, decreased coordination, decreased endurance, decreased  mobility, decreased ROM, decreased strength, increased fascial restrictions, increased muscle spasms, impaired flexibility, and pain.   ACTIVITY LIMITATIONS: bending  PARTICIPATION LIMITATIONS: interpersonal relationship, occupation, and yard work  PERSONAL FACTORS: Time since onset of injury/illness/exacerbation are also affecting patient's functional outcome.   REHAB POTENTIAL: Good  CLINICAL DECISION MAKING: Stable/uncomplicated  EVALUATION COMPLEXITY: Low   GOALS: Goals reviewed with patient? Yes  SHORT TERM GOALS: Target date: 06/10/2023    Pt will be independent with HEP.   Baseline: not  Goal status: not yet  2.  Pt will be able to run/workout for 30 minutes without leakage or discomfort  Baseline:   Goal status: leakage is worse because she is not as tense  3.  Pt will be independent with diaphragmatic breathing and down training activities in order to improve pelvic floor relaxation.  Baseline:  Goal status: INITIAL   LONG TERM GOALS: Target date: 08/05/2023    Pt will be independent with advanced HEP.   Baseline:  Goal status: INITIAL  2.  Pt will report decreased pelvic pain to max 3/10  Baseline:  Goal status: INITIAL  3.  Pt will report reduced pain abdominal scars to max 3/10 Baseline:  Goal status: improving  4.  Pt will soak 0 pads/ day Baseline:  Goal status: INITIAL   PLAN:  PT FREQUENCY: 1-2x/week  PT DURATION: 12 weeks  PLANNED INTERVENTIONS: Therapeutic exercises, Therapeutic activity, Neuromuscular re-education, Balance training, Gait training, Patient/Family education, Self Care, Joint mobilization, Dry Needling, Electrical stimulation, Spinal manipulation, Spinal mobilization, scar mobilization, Biofeedback, and Manual therapy  PLAN FOR NEXT SESSION: core strengthening for [facility] Vitalia Stough, PT 07/15/23 11:49 AM

## 2023-07-16 ENCOUNTER — Ambulatory Visit: Payer: Managed Care, Other (non HMO) | Admitting: Physical Therapy

## 2023-07-16 DIAGNOSIS — M6281 Muscle weakness (generalized): Secondary | ICD-10-CM | POA: Diagnosis not present

## 2023-07-16 DIAGNOSIS — M546 Pain in thoracic spine: Secondary | ICD-10-CM

## 2023-07-16 DIAGNOSIS — M542 Cervicalgia: Secondary | ICD-10-CM

## 2023-07-16 DIAGNOSIS — M62838 Other muscle spasm: Secondary | ICD-10-CM

## 2023-07-16 NOTE — Therapy (Signed)
OUTPATIENT PHYSICAL THERAPY CERVICAL TREATMENT NOTE   Patient Name: Adrienne Williams MRN: [id number] DOB:14-Aug-1980, 42 y.o., female Today's Date: 07/16/2023  END OF SESSION:  PT End of Session - 07/16/23 0854     Visit Number 24    Date for PT Re-Evaluation 07/30/23    Authorization Type [employer]    PT Start Time 0850    PT Stop Time 0935    PT Time Calculation (min) 45 min    Activity Tolerance Patient tolerated treatment well              Past Medical History:  Diagnosis Date   Anxiety    Asthma    Depression    Family history of breast cancer    Homero Fellers breech presentation 05/29/2018   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   Hemorrhoids    Hx of varicella    PONV (postoperative nausea and vomiting)    "little nauseated"   Postpartum care following cesarean delivery (6/14) 01/18/2016   Past Surgical History:  Procedure Laterality Date   BREAST BIOPSY Right 02/25/2023   CESAREAN SECTION N/A 01/18/2016   Procedure: Primary CESAREAN SECTION;  Surgeon: Genia Del, MD;  Location: [facility];  Service: Obstetrics;  Laterality: N/A;  EDD: 01/25/16   CESAREAN SECTION N/A 05/29/2018   Procedure: Repeat CESAREAN SECTION;  Surgeon: Shea Evans, MD;  Location: [facility];  Service: Obstetrics;  Laterality: N/A;  EDD: 06/10/18   COLONOSCOPY     CYSTECTOMY  2002   pilonidal region   DIAGNOSTIC LAPAROSCOPY WITH REMOVAL OF ECTOPIC PREGNANCY Right 05/29/2020   Procedure: DIAGNOSTIC LAPAROSCOPY WITH REMOVAL OF ECTOPIC PREGNANCY;  Surgeon: Shea Evans, MD;  Location: [facility] OR;  Service: Gynecology;  Laterality: Right;   MOLE REMOVAL     UNILATERAL SALPINGECTOMY Right 05/29/2020   Procedure: UNILATERAL SALPINGECTOMY;  Surgeon: Shea Evans, MD;  Location: [facility] OR;  Service: Gynecology;  Laterality: Right;   WISDOM TOOTH EXTRACTION     Patient Active Problem List   Diagnosis Date Noted   Poor sleep 05/22/2023   Neck pain, chronic  06/27/2022   Fibromyalgia 06/27/2022   Myofascial pain 06/27/2022   Internal hemorrhoids 06/13/2022   Rectal bleeding 06/13/2022   Family history of breast cancer 01/18/2022   Monoallelic mutation of CHEK2 gene in female patient 01/18/2022   Anxiety and depression 05/31/2018   Rh negative, maternal 05/31/2018   Homero Fellers breech presentation 05/29/2018   Status post repeat low transverse cesarean section 05/29/2018   Postpartum care following cesarean delivery (10/24) 05/29/2018    PCP: Jarrett Soho, PA-C   REFERRING PROVIDER: Jackelyn Poling, DO  REFERRING DIAG: [phone] (ICD-10-CM) - Other muscle spasm  THERAPY DIAG:  Muscle weakness (generalized)  Other muscle spasm  Cervicalgia  Pain in thoracic spine  Rationale for Evaluation and Treatment: Rehabilitation  ONSET DATE: 03/26/2023  SUBJECTIVE:  SUBJECTIVE STATEMENT: Going to work with a Systems analyst next week (Tuesday) at 8:30 concerned about how expensive it is  EVAL: Patient reports she was helping her husband install garage seal on 03-10-23.  She felt severe pain in thoracic spine and neck onset around 03-13-23 and explains that she noticed that she had a lot of swelling and had to get a bigger bra and extender for her bra due to the swelling.  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  Difficulty with driving, sleeping, IADL's self care and working.  I have two kids and caring for them and the dogs has been very challenging.  I am usually very active but unable to do anything right now.  Hand dominance: Right  PERTINENT HISTORY:  na  PAIN:  PAIN:  Are you having pain? Yes NPRS scale:  4/10 upper traps, mid back; shoulders Pain location: chest, low shoulder blades Aggravating  factors: stress; overstretching Relieving factors: DN   PRECAUTIONS: None  RED FLAGS: None     WEIGHT BEARING RESTRICTIONS: No  FALLS:  Has patient fallen in last 6 months? No  LIVING ENVIRONMENT: Lives with: lives with their family and lives with their spouse Lives in: House/apartment   OCCUPATION: Pharmacist  PLOF: Independent, Independent with basic ADLs, Independent with household mobility without device, Independent with community mobility without device, Independent with homemaking with ambulation, Independent with gait, and Independent with transfers  PATIENT GOALS: to eliminate pain and be able to work and do my usual daily activities and sleep without interruption The Patient-Specific Functional Scale  Initial:  I am going to ask you to identify up to 3 important activities that you are unable to do or are having difficulty with as a result of this problem.  Today are there any activities that you are unable to do or having difficulty with because of this?  (Patient shown scale and patient rated each activity)  Follow up: When you first came in you had difficulty performing these activities.  Today do you still have difficulty?  Patient-Specific activity scoring scheme (Point to one number):  0 1 2 3 4 5 6 7 8 9  10 Unable                                                                                                          Able to perform To perform                                                                                                    activity at the same Activity  Level as before                                                                                                                       Injury or problem  Activity       Stamina to work all day                                                                       10/15     5                   2.             Driving the kids to school and picking up                                     10/15            6                      3.            Doing extended housework                                                            10/15                4   4. Able to push the cart at [employer]    NEXT MD VISIT: 04/10/23  OBJECTIVE:   DIAGNOSTIC FINDINGS:  MRI showed small lesion 4 mm breast and had biopsy in July; normal but recommended another MRI and biopsy   PATIENT SURVEYS:  NDI 80% self disability score  NDI 10/15:  54%  NDI 11/26:   48%   COGNITION: Overall cognitive status: Within functional limits for tasks assessed  SENSATION: WFL  POSTURE: rounded shoulders  PALPATION: Tight bands and trigger points in R > L upper traps and rhomboids   CERVICAL ROM:   Active ROM A/PROM (deg) eval 10/15 11/26  Flexion WNL WNL WNL  Extension WNL WNL   Right lateral flexion WNL WNL   Left lateral flexion WNL WNL   Right rotation WNL WNL   Left rotation WNL WNL    (Blank rows = not tested)   UPPER EXTREMITY MMT: 5/5 throughout bilateral upper extremities but patient states that she "is going to be so sore" from the muscle testing throughout the test.   TODAY'S TREATMENT:  12/10 Nu-Step L5 ( green machine) 5 min Discussion of exercise and for general health and well being (longevity of life, decreased hospitalizations, dec chronic disease etc) it's like "health insurance" benefit of a trainer for accountability  Discussed and given notes about starting with low reps 10 and under, low volume at first and key machines: lat bar, leg press, row, triceps Lat bar 30# 10x Cable row 10# 10x Blue loop wall slides with lift off at the top 10x Manual therapy to bil upper traps, periscapular musculature Trigger Point Dry-Needling  Treatment instructions: Expect mild to moderate muscle soreness. S/S of pneumothorax if dry needled over a lung field, and to seek immediate medical attention should they occur. Patient verbalized understanding of these instructions and  education.  Patient Consent Given: Yes Education handout provided: Previously provided Muscles treated:  bil teres; bil upper traps;  bil levator scap; bil subscapularis, bil posterior deltoid Electrical stimulation performed: No Parameters: N/A Treatment response/outcome: dec tender point size/number, twitch response and decreased muscle tension  Heat following DN after session 12/6 Nu-Step L5 ( green machine) 5 min Prone press ups 5x  Prone over 1 pillow: hip extension 5x (added to HEP) Prone over 1 pillow: bil shoulder extension palms down (added to HEP) Prone over 1 pillow: bil shoulder horizontal abduction (added to HEP) Open books 5x right/left with leg on foam roll  Manual therapy to bil upper traps, periscapular musculature Trigger Point Dry-Needling  Treatment instructions: Expect mild to moderate muscle soreness. S/S of pneumothorax if dry needled over a lung field, and to seek immediate medical attention should they occur. Patient verbalized understanding of these instructions and education.  Patient Consent Given: Yes Education handout provided: Previously provided Muscles treated:  bil pecs, bil teres; bil upper traps;  bil levator scap Electrical stimulation performed: No Parameters: N/A Treatment response/outcome: dec tender point size/number, twitch response and decreased muscle tension  Heat following DN 11/26 Nu-Step L3 ( blue machine) 5 min NDI Thread the needle green band with other hand in the chair 10x right/left  Standing 35# lat pulls 5x  3# alternating overhead press 10x Green loop wall lift offs 10x Manual therapy to bil upper traps, periscapular musculature Trigger Point Dry-Needling  Treatment instructions: Expect mild to moderate muscle soreness. S/S of pneumothorax if dry needled over a lung field, and to seek immediate medical attention should they occur. Patient verbalized understanding of these instructions and education.  Patient Consent Given:  Yes Education handout provided: Previously provided Muscles treated:  bil upper traps;bil teres, bil levator scap; right rhomboids; right subscapularis; bil posterior deltoids Electrical stimulation performed: No Parameters: N/A Treatment response/outcome: dec tender point size/number, twitch response and decreased muscle tension  Heat following DN         PATIENT EDUCATION:  Education details:  HEP Person educated: Patient Education method: Programmer, multimedia, Facilities manager, Verbal cues, and Handouts Education comprehension: verbalized understanding, returned demonstration, and verbal cues required  HOME EXERCISE PROGRAM: Access Code: [email or web address] URL: [email or web address] Date: 07/12/2023 Prepared by: Lavinia Sharps  Exercises - Supine Chest Stretch on Foam Roll  - 1 x daily - 7 x weekly - 3 sets - 10 reps - Thoracic Extension Mobilization on Foam Roll  - 1 x daily - 7 x weekly - 3 sets - 10 reps - Supine Shoulder Horizontal Abduction with Resistance  - 1 x daily - 7 x weekly - 3 sets - 10 reps - Supine PNF D2 Flexion with Resistance  - 1 x daily - 7  x weekly - 3 sets - 10 reps - Latissimus Dorsi Stretch at Wall  - 2 x daily - 7 x weekly - 1 sets - 2-3 reps - 20 hold - Prone Chest Stretch on Chair  - 1 x daily - 7 x weekly - 1 sets - 2-3 reps - 20 hold - Standing Thoracic Open Book at Wall  - 1 x daily - 7 x weekly - 1 sets - 5 reps - Standing Cervical Retraction  - 3-4 x daily - 7 x weekly - 1 sets - 5 reps - Wall Push Up  - 1 x daily - 7 x weekly - 1 sets - 10 reps - Wall Angels  - 1 x daily - 7 x weekly - 1 sets - 10 reps - Shoulder Overhead Press in Flexion with Dumbbells  - 1 x daily - 7 x weekly - 2 sets - 5 reps - Squat with Chair Touch  - 1 x daily - 7 x weekly - 2 sets - 5 reps - Half Deadlift with Kettlebell  - 1 x daily - 7 x weekly - 2 sets - 5 reps - Farmer's Carry with Kettlebells  - 1 x daily - 7 x weekly - 1 sets - 10 reps - Standing Hip Circles  - 1 x daily -  7 x weekly - 1 sets - 10 reps - Seated Assisted Cervical Rotation with Towel  - 1 x daily - 7 x weekly - 1 sets - 10 reps - Cervical Extension AROM with Strap  - 1 x daily - 7 x weekly - 1 sets - 10 reps - Supine Diaphragmatic Breathing  - 1 x daily - 7 x weekly - 1 sets - 10 reps - Doorway Pec Stretch at 90 Degrees Abduction  - 1 x daily - 7 x weekly - 2 sets - 2 reps - 20 hold - Doorway Pec Stretch at 120 Degrees Abduction  - 1 x daily - 7 x weekly - 2 sets - 2 reps - 20 hold - Standing Quadratus Lumborum Stretch with Doorway  - 1 x daily - 7 x weekly - 2 sets - 2 reps - 20 hold - Standing Lat Pull Down with Resistance - Elbows Bent  - 1 x daily - 7 x weekly - 1 sets - 10 reps - Shoulder extension with resistance - Neutral  - 1 x daily - 7 x weekly - 1 sets - 10 reps - Standing Plank on Wall  - 1 x daily - 7 x weekly - 1 sets - 5 reps - 5 hold - Standing Full Side Plank on Wall  - 1 x daily - 7 x weekly - 1 sets - 3 reps - 5 hold - Prone Hip Extension - One Pillow  - 1 x daily - 7 x weekly - 1 sets - 5 reps - Prone Shoulder Extension  - 1 x daily - 7 x weekly - 1 sets - 5 reps - Prone Scapular Retraction Arms at Side  - 1 x daily - 7 x weekly - 1 sets - 5 reps -ASSESSMENT:  CLINICAL IMPRESSION: Pt has set up an appt with an athletic trainer for guidance with transition to a gym program.  We discussed the numerous benefits of exercise and how having guidance will help with accountability.  She responds very well to DN targeting periscapular muscles today with decreasing tender points compared to start of care.  Will continue to finalize HEP to promote  independence.    OBJECTIVE IMPAIRMENTS: increased fascial restrictions and increased muscle spasms.   ACTIVITY LIMITATIONS: carrying, lifting, bending, sitting, squatting, sleeping, transfers, bed mobility, bathing, dressing, and hygiene/grooming  PARTICIPATION LIMITATIONS: meal prep, cleaning, laundry, interpersonal relationship, driving,  shopping, community activity, and occupation  PERSONAL FACTORS: Education, Past/current experiences, and 3+ comorbidities: anxiety , depression and chronic pain  are also affecting patient's functional outcome.   REHAB POTENTIAL: Fair    CLINICAL DECISION MAKING: Unstable/unpredictable  EVALUATION COMPLEXITY: High   GOALS: Goals reviewed with patient? Yes  SHORT TERM GOALS: Target date: 04/24/2023   Patient to report pain no greater than 2/10  Baseline:  Goal status: ongoing  2.  Patient will be independent with initial HEP  Baseline:  Goal status: met 9/17    LONG TERM GOALS: Target date:07/30/2023   Patient to report an overall improvement at 60% with home and work ADLs Baseline:  Goal status: revised  2.  Patient to be independent with advanced HEP  Baseline:  Goal status:ongoing  3.  Patient to be able to sleep through the night  Baseline:  Goal status: ongoing 4.  Patient to be able to return to work Baseline:  Goal status: met 10/15  5.  Neck pain disablity score to improve by 5 Baseline:  Goal status: met 10/15  6.  Patient to report PSFS score of 6 with doing housework for extended period of time Baseline:  Goal status: INITIAL 7.  Patient will report PSFS of 8 with driving kids to and from school New  8. PSFS of 6 with stamina to work a long shift new   PLAN:  PT FREQUENCY: 1x/week  PT DURATION: 10 weeks  PLANNED INTERVENTIONS: Therapeutic exercises, Therapeutic activity, Neuromuscular re-education, Balance training, Gait training, Patient/Family education, Self Care, Joint mobilization, Vestibular training, Canalith repositioning, Aquatic Therapy, Dry Needling, Electrical stimulation, Spinal mobilization, Cryotherapy, Moist heat, Taping, Vasopneumatic device, Traction, Ultrasound, Ionotophoresis 4mg /ml Dexamethasone, and Manual therapy  PLAN FOR NEXT SESSION:   steering wheels side/lateral wall planks; DN;  Nu-step, finalize HEP, progress  postural strengthening; resistance training upper quarter  Lavinia Sharps, PT 07/16/23 7:21 PM Phone: [phone] Fax: [phone]

## 2023-07-22 ENCOUNTER — Encounter: Payer: Self-pay | Admitting: Physical Therapy

## 2023-07-22 ENCOUNTER — Ambulatory Visit: Payer: Managed Care, Other (non HMO) | Admitting: Physical Therapy

## 2023-07-22 DIAGNOSIS — M62838 Other muscle spasm: Secondary | ICD-10-CM

## 2023-07-22 DIAGNOSIS — R279 Unspecified lack of coordination: Secondary | ICD-10-CM

## 2023-07-22 DIAGNOSIS — M542 Cervicalgia: Secondary | ICD-10-CM

## 2023-07-22 DIAGNOSIS — M546 Pain in thoracic spine: Secondary | ICD-10-CM

## 2023-07-22 DIAGNOSIS — M6281 Muscle weakness (generalized): Secondary | ICD-10-CM

## 2023-07-22 NOTE — Therapy (Addendum)
OUTPATIENT PHYSICAL THERAPY FEMALE PELVIC TREATMENT   Patient Name: Adrienne Williams MRN: [id number] DOB:Jan 02, 1981, 42 y.o., female Today's Date: 07/22/2023  END OF SESSION:  PT End of Session - 07/22/23 1056     Visit Number 25    Date for PT Re-Evaluation 07/30/23    Authorization Type [employer]    PT Start Time 1019    PT Stop Time 1100    PT Time Calculation (min) 41 min    Activity Tolerance Patient tolerated treatment well    Behavior During Therapy [facility] for tasks assessed/performed                Past Medical History:  Diagnosis Date   Anxiety    Asthma    Depression    Family history of breast cancer    Frank breech presentation 05/29/2018   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   Hemorrhoids    Hx of varicella    PONV (postoperative nausea and vomiting)    "little nauseated"   Postpartum care following cesarean delivery (6/14) 01/18/2016   Past Surgical History:  Procedure Laterality Date   BREAST BIOPSY Right 02/25/2023   CESAREAN SECTION N/A 01/18/2016   Procedure: Primary CESAREAN SECTION;  Surgeon: Genia Del, MD;  Location: [facility];  Service: Obstetrics;  Laterality: N/A;  EDD: 01/25/16   CESAREAN SECTION N/A 05/29/2018   Procedure: Repeat CESAREAN SECTION;  Surgeon: Shea Evans, MD;  Location: [facility];  Service: Obstetrics;  Laterality: N/A;  EDD: 06/10/18   COLONOSCOPY     CYSTECTOMY  2002   pilonidal region   DIAGNOSTIC LAPAROSCOPY WITH REMOVAL OF ECTOPIC PREGNANCY Right 05/29/2020   Procedure: DIAGNOSTIC LAPAROSCOPY WITH REMOVAL OF ECTOPIC PREGNANCY;  Surgeon: Shea Evans, MD;  Location: [facility] OR;  Service: Gynecology;  Laterality: Right;   MOLE REMOVAL     UNILATERAL SALPINGECTOMY Right 05/29/2020   Procedure: UNILATERAL SALPINGECTOMY;  Surgeon: Shea Evans, MD;  Location: [facility] OR;  Service: Gynecology;  Laterality: Right;   WISDOM TOOTH EXTRACTION     Patient Active Problem List    Diagnosis Date Noted   Poor sleep 05/22/2023   Neck pain, chronic 06/27/2022   Fibromyalgia 06/27/2022   Myofascial pain 06/27/2022   Internal hemorrhoids 06/13/2022   Rectal bleeding 06/13/2022   Family history of breast cancer 01/18/2022   Monoallelic mutation of CHEK2 gene in female patient 01/18/2022   Anxiety and depression 05/31/2018   Rh negative, maternal 05/31/2018   Homero Fellers breech presentation 05/29/2018   Status post repeat low transverse cesarean section 05/29/2018   Postpartum care following cesarean delivery (10/24) 05/29/2018    PCP: Jarrett Soho, PA-C PCP - General  REFERRING PROVIDER: Vick Frees, MD Ref Provider  REFERRING DIAG: N39.3 (ICD-10-CM) - Stress incontinence (female) (female)  THERAPY DIAG:  Muscle weakness (generalized)  Pain in thoracic spine  Unspecified lack of coordination  Other muscle spasm  Cervicalgia  Rationale for Evaluation and Treatment: Rehabilitation  ONSET DATE: 2019  SUBJECTIVE:   Pt reports that she is stressed, husband drives her crazy. Tired too, worked all weekend.  Just got a 30 min massage  Leaking is better when she activates her muscles but when she is stressed and tight, her urgency is worse.  SUBJECTIVE STATEMENT:  Fluid intake: Yes: mostly water, seltzer, liquid IV    PAIN:  Are you having pain? Yes NPRS scale: 3/10 Pain location: External  Pain type: throbbing, tight, and tingling Pain description: constant, stabbing if super tight  Aggravating factors: walking or running, being on her feet, sitting, standing Relieving factors: diaphragmatic breathing, lidocaine patches PRECAUTIONS: None  RED FLAGS: None   WEIGHT BEARING RESTRICTIONS: No  FALLS:  Has patient fallen in last 6 months? No  LIVING ENVIRONMENT: Lives with: lives with their family Lives in:  House/apartment Stairs: No Has following equipment at home: None  OCCUPATION: pharmacist- retail, very difficult right now  PLOF: Independent  PATIENT GOALS: to be able to relieve pain without having to spend a ton of time on it, has 2 young kids and a job. Pain is a priority. Rare that the leaking happens.   PERTINENT HISTORY:   Sexual abuse: No  BOWEL MOVEMENT: Pain with bowel movement: Yes when stuff is super tight Type of bowel movement:Strain Yes sometimes Fully empty rectum: Yes: when she goes Leakage: No only when the hemorrhoid is really bad, it was crazy, it' better now Pads: No Fiber supplement: No  URINATION: Pain with urination: No Fully empty bladder: No sometimes feels like she struggles Stream: Weak Urgency: No, used to Frequency: not now Leakage: Coughing, Sneezing, and Exercise, running Pads: Yes:    INTERCOURSE: Pain with intercourse: Initial Penetration, During Penetration, After Intercourse, During Climax, and Pain Interrupts Intercourse Ability to have vaginal penetration:  Yes: has not in a while Climax: yes Marinoff Scale: 2/3  PREGNANCY: Vaginal deliveries 0 Tearing No C-section deliveries 2 Currently pregnant No  PROLAPSE: Check with internal   OBJECTIVE:  Note: Objective measures were completed at Evaluation unless otherwise noted.   COGNITION: Overall cognitive status: Within functional limits for tasks assessed     SENSATION: Light touch: Appears intact Proprioception: Appears intact  MUSCLE LENGTH: Hamstrings: tight bilateral Thomas test: Right tight ; Left tight    POSTURE: rounded shoulders and forward head  PELVIC ALIGNMENT:  LUMBARAROM/PROM:  A/PROM A/PROM  eval  Flexion limited  Extension limited  Right lateral flexion limited  Left lateral flexion   Right rotation   Left rotation    (Blank rows = not tested)  LOWER EXTREMITY ROM:  Passive ROM Right eval Left eval  Hip flexion limited limited  Hip  extension    Hip abduction limited limited  Hip adduction    Hip internal rotation    Hip external rotation    Knee flexion    Knee extension    Ankle dorsiflexion    Ankle plantarflexion    Ankle inversion    Ankle eversion     (Blank rows = not tested)  LOWER EXTREMITY MMT: 4/5 grossly overall  PALPATION:   General  upper chest breathing and restrictions throughout abdomen,  Trigger points throughout glutes and abdomen- more on right,  Tight umbilical scars                External Perineal Exam wfl                             Internal Pelvic Floor decreased strength, high tone, tight and tender throughout Patient confirms identification and approves PT to assess internal pelvic floor and treatment Yes  PELVIC MMT:   MMT eval  Vaginal 2/5  Internal Anal Sphincter 5/5  External Anal Sphincter 5/5  Puborectalis  Diastasis Recti Yes- 1 finger above umbilicus  (Blank rows = not tested)        TONE: high  PROLAPSE: no TODAY'S TREATMENT:                                                                                                                              DATE:  07/22/2023 Neuro reed- ball press with transverse abdominis breath- to tolerance  Horizontal abduction with thera band with transverse abdominis breath (red ) Seated ball press with transverse abdominis breath to tolerance- unilat Diaphragmatic breathing  Bridging with transverse abdominis breath   Manual- dry needling bilateral rectus abdominis, bilateral glutes and lumbar paraspinals   PATIENT EDUCATION:  Education details/ manual performed: scar massage and cupping Person educated: Patient Education method: Programmer, multimedia, Demonstration, and Handouts Education comprehension: verbalized understanding, returned demonstration, verbal cues required, tactile cues required, and needs further education  Trigger Point Dry-Needling  Treatment instructions: Expect mild to moderate muscle soreness. S/S of  pneumothorax if dry needled over a lung field, and to seek immediate medical attention should they occur. Patient verbalized understanding of these instructions and education.  Patient Consent Given: Yes Education handout provided: Previously provided Muscles treated: lumbar paraspinals, umbilical scar, rectus abdominis and glutes Treatment response/outcome: Utilized skilled palpation to identify trigger points.  During dry needling able to palpate muscle twitch and muscle elongation   Skilled palpation and monitoring by PT during dry needling   HOME EXERCISE PROGRAM:   [id number]  ASSESSMENT:  CLINICAL IMPRESSION: Pt did well with dry needling and exercises. Increased trigger points today, pt more stressed.. Pt ready to address SUI and abdominal diastasis with core strengthening. She presents with 1 finger diastasis above umbilicus and pelvic floor weakness. Did fairly well with her new exercises, reps to tolerance so she does not aggravate her pain. Will benefit from continued physical therapy to reduce urge incontinence and stress incontinence.  Reeval completed.    OBJECTIVE IMPAIRMENTS: decreased activity tolerance, decreased coordination, decreased endurance, decreased mobility, decreased ROM, decreased strength, increased fascial restrictions, increased muscle spasms, impaired flexibility, and pain.   ACTIVITY LIMITATIONS: bending  PARTICIPATION LIMITATIONS: interpersonal relationship, occupation, and yard work  PERSONAL FACTORS: Time since onset of injury/illness/exacerbation are also affecting patient's functional outcome.   REHAB POTENTIAL: Good  CLINICAL DECISION MAKING: Stable/uncomplicated  EVALUATION COMPLEXITY: Low   GOALS: updated 07/22/2023 Goals reviewed with patient? Yes  SHORT TERM GOALS: Target date: 06/10/2023    Pt will be independent with HEP.   Baseline: not  Goal status: not yet  2.  Pt will be able to run/workout for 30 minutes without leakage or  discomfort  Baseline:   Goal status: leakage is worse because she is not as tense  3.  Pt will be independent with diaphragmatic breathing and down training activities in order to improve pelvic floor relaxation.  Baseline: no Goal status: progressing  LONG TERM GOALS: Target date: 08/05/2023 - updated 09/16/2023  Pt will be independent with advanced HEP.   Baseline: not yet Goal status: progressing  2.  Pt will report decreased pelvic pain to max 3/10  Baseline:  Goal status: INITIAL  3.  Pt will report reduced pain abdominal scars to max 3/10 Baseline:  Goal status: improving  4.  Pt will soak 0 pads/ day Baseline:  Goal status: INITIAL   PLAN:  PT FREQUENCY: 1-2x/week  PT DURATION: 8 weeks  PLANNED INTERVENTIONS: Therapeutic exercises, Therapeutic activity, Neuromuscular re-education, Balance training, Gait training, Patient/Family education, Self Care, Joint mobilization, Dry Needling, Electrical stimulation, Spinal manipulation, Spinal mobilization, scar mobilization, Biofeedback, and Manual therapy  PLAN FOR NEXT SESSION: core strengthening for SUI,  Lorann Tani, PT 07/22/23 10:57 AM

## 2023-07-22 NOTE — Addendum Note (Signed)
Addended byBeckie Salts on: 07/22/2023 11:02 AM   Modules accepted: Orders

## 2023-07-26 ENCOUNTER — Ambulatory Visit: Payer: Managed Care, Other (non HMO) | Admitting: Physical Therapy

## 2023-07-26 DIAGNOSIS — M546 Pain in thoracic spine: Secondary | ICD-10-CM

## 2023-07-26 DIAGNOSIS — M6281 Muscle weakness (generalized): Secondary | ICD-10-CM | POA: Diagnosis not present
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
## 2023-07-30 ENCOUNTER — Ambulatory Visit: Payer: Managed Care, Other (non HMO) | Admitting: Physical Therapy

## 2023-08-06 ENCOUNTER — Encounter: Payer: Self-pay | Admitting: Physical Therapy

## 2023-08-06 ENCOUNTER — Ambulatory Visit: Payer: Managed Care, Other (non HMO) | Admitting: Physical Therapy

## 2023-08-06 DIAGNOSIS — M6281 Muscle weakness (generalized): Secondary | ICD-10-CM | POA: Diagnosis not present

## 2023-08-06 DIAGNOSIS — M62838 Other muscle spasm: Secondary | ICD-10-CM

## 2023-08-06 DIAGNOSIS — R279 Unspecified lack of coordination: Secondary | ICD-10-CM
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
## 2023-08-13 ENCOUNTER — Encounter: Payer: Self-pay | Admitting: Physical Therapy

## 2023-08-13 ENCOUNTER — Ambulatory Visit: Payer: Managed Care, Other (non HMO) | Attending: Obstetrics and Gynecology | Admitting: Physical Therapy

## 2023-08-13 DIAGNOSIS — R279 Unspecified lack of coordination: Secondary | ICD-10-CM | POA: Insufficient documentation

## 2023-08-13 DIAGNOSIS — M546 Pain in thoracic spine: Secondary | ICD-10-CM | POA: Insufficient documentation

## 2023-08-13 DIAGNOSIS — M6281 Muscle weakness (generalized): Secondary | ICD-10-CM | POA: Insufficient documentation

## 2023-08-13 DIAGNOSIS — M62838 Other muscle spasm: Secondary | ICD-10-CM | POA: Diagnosis present

## 2023-08-13 DIAGNOSIS — M542 Cervicalgia: Secondary | ICD-10-CM | POA: Diagnosis present

## 2023-08-13 DIAGNOSIS — R252 Cramp and spasm: Secondary | ICD-10-CM | POA: Diagnosis present
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
## 2023-08-20 ENCOUNTER — Ambulatory Visit: Payer: Managed Care, Other (non HMO) | Admitting: Physical Therapy

## 2023-08-20 DIAGNOSIS — M6281 Muscle weakness (generalized): Secondary | ICD-10-CM

## 2023-08-20 DIAGNOSIS — M542 Cervicalgia: Secondary | ICD-10-CM

## 2023-08-20 DIAGNOSIS — M546 Pain in thoracic spine: Secondary | ICD-10-CM

## 2023-08-20 NOTE — Therapy (Signed)
 OUTPATIENT PHYSICAL THERAPY CERVICAL TREATMENT NOTE/RECERTIFICATION   Patient Name: Adrienne Williams MRN: [id number] DOB:06-21-1981, 43 y.o., female Today's Date: 08/20/2023  END OF SESSION:  PT End of Session - 08/20/23 0928     Visit Number 26    Date for PT Re-Evaluation 10/15/23    Authorization Type [employer]    PT Start Time 0930    PT Stop Time 1015    PT Time Calculation (min) 45 min    Activity Tolerance Patient tolerated treatment well              Past Medical History:  Diagnosis Date   Anxiety    Asthma    Depression    Family history of breast cancer    Frank breech presentation 05/29/2018   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   Hemorrhoids    Hx of varicella    PONV (postoperative nausea and vomiting)    little nauseated   Postpartum care following cesarean delivery (6/14) 01/18/2016   Past Surgical History:  Procedure Laterality Date   BREAST BIOPSY Right 02/25/2023   CESAREAN SECTION N/A 01/18/2016   Procedure: Primary CESAREAN SECTION;  Surgeon: Percilla Burly, MD;  Location: [facility];  Service: Obstetrics;  Laterality: N/A;  EDD: 01/25/16   CESAREAN SECTION N/A 05/29/2018   Procedure: Repeat CESAREAN SECTION;  Surgeon: Barbette Knock, MD;  Location: [facility];  Service: Obstetrics;  Laterality: N/A;  EDD: 06/10/18   COLONOSCOPY     CYSTECTOMY  2002   pilonidal region   DIAGNOSTIC LAPAROSCOPY WITH REMOVAL OF ECTOPIC PREGNANCY Right 05/29/2020   Procedure: DIAGNOSTIC LAPAROSCOPY WITH REMOVAL OF ECTOPIC PREGNANCY;  Surgeon: Barbette Knock, MD;  Location: [facility];  Service: Gynecology;  Laterality: Right;   MOLE REMOVAL     UNILATERAL SALPINGECTOMY Right 05/29/2020   Procedure: UNILATERAL SALPINGECTOMY;  Surgeon: Barbette Knock, MD;  Location: [facility] OR;  Service: Gynecology;  Laterality: Right;   WISDOM TOOTH EXTRACTION     Patient Active Problem List   Diagnosis Date Noted   Poor sleep 05/22/2023   Neck  pain, chronic 06/27/2022   Fibromyalgia 06/27/2022   Myofascial pain 06/27/2022   Internal hemorrhoids 06/13/2022   Rectal bleeding 06/13/2022   Family history of breast cancer 01/18/2022   Monoallelic mutation of CHEK2 gene in female patient 01/18/2022   Anxiety and depression 05/31/2018   Rh negative, maternal 05/31/2018   Dempsey breech presentation 05/29/2018   Status post repeat low transverse cesarean section 05/29/2018   Postpartum care following cesarean delivery (10/24) 05/29/2018    PCP: Katina Pfeiffer, PA-C   REFERRING PROVIDER: Dayna Motto, DO  REFERRING DIAG: [phone] (ICD-10-CM) - Other muscle spasm  THERAPY DIAG:  Muscle weakness (generalized)  Pain in thoracic spine  Cervicalgia  Rationale for Evaluation and Treatment: Rehabilitation  ONSET DATE: 03/26/2023  SUBJECTIVE:  SUBJECTIVE STATEMENT: I might end up with 2 trainers.  One of those is my brother.  Went last night 5 ex's 3 sets.  Anxiety triggers.  Doing marriage counseling. Getting weekly massages.  Did a 15 minute run.      EVAL: Patient reports she was helping her husband install garage seal on 03-10-23.  She felt severe pain in thoracic spine and neck onset around 03-13-23 and explains that she noticed that she had a lot of swelling and had to get a bigger bra and extender for her bra due to the swelling.  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  Difficulty with driving, sleeping, IADL's self care and working.  I have two kids and caring for them and the dogs has been very challenging.  I am usually very active but unable to do anything right now.  Hand dominance: Right  PERTINENT HISTORY:  na  PAIN:  PAIN:  Are you having pain? Yes NPRS scale:  4-5/10 upper  traps, mid back; shoulders Pain location: chest, low shoulder blades Aggravating factors: stress; overstretching Relieving factors: DN   PRECAUTIONS: None  RED FLAGS: None     WEIGHT BEARING RESTRICTIONS: No  FALLS:  Has patient fallen in last 6 months? No  LIVING ENVIRONMENT: Lives with: lives with their family and lives with their spouse Lives in: House/apartment   OCCUPATION: Pharmacist  PLOF: Independent, Independent with basic ADLs, Independent with household mobility without device, Independent with community mobility without device, Independent with homemaking with ambulation, Independent with gait, and Independent with transfers  PATIENT GOALS: to eliminate pain and be able to work and do my usual daily activities and sleep without interruption The Patient-Specific Functional Scale  Initial:  I am going to ask you to identify up to 3 important activities that you are unable to do or are having difficulty with as a result of this problem.  Today are there any activities that you are unable to do or having difficulty with because of this?  (Patient shown scale and patient rated each activity)  Follow up: When you first came in you had difficulty performing these activities.  Today do you still have difficulty?  Patient-Specific activity scoring scheme (Point to one number):  0 1 2 3 4 5 6 7 8 9  10 Unable                                                                                                          Able to perform To perform  activity at the same Activity         Level as before                                                                                                                       Injury or problem  Activity       Stamina to work all day                                                                       10/15     5             1/14:    10   2.              Driving the kids to school and picking up                                    10/15      6           1/14:      7-8    3.            Doing extended housework                                                            10/15      4            1/14:     7   4. Able to push the cart at [employer]                                                                                               1/14:     7   NEXT MD VISIT: 04/10/23  OBJECTIVE:   DIAGNOSTIC FINDINGS:  MRI showed small lesion 4 mm breast and had biopsy in July; normal but recommended another MRI and biopsy   PATIENT SURVEYS:  NDI 80% self disability score  NDI 10/15:  54%  NDI 11/26:   48%   NDI 1/14:    36%  COGNITION: Overall cognitive status: Within functional limits for tasks assessed  SENSATION: WFL  POSTURE: rounded shoulders  PALPATION: Tight bands and trigger points in R > L upper traps and rhomboids   CERVICAL ROM:   Active ROM A/PROM (deg) eval 10/15 11/26 1/14  Flexion WNL WNL WNL   Extension WNL WNL    Right lateral flexion WNL WNL  20% limited  Left lateral flexion WNL WNL  20% limited  Right rotation WNL WNL  10% limited  Left rotation WNL WNL     (Blank rows = not tested)   UPPER EXTREMITY MMT: 5/5 throughout bilateral upper extremities but patient states that she is going to be so sore from the muscle testing throughout the test.   TODAY'S TREATMENT:   08/20/2023 UBE change direction 1/2 way:  5 min while discussing status  Discussion of current exercise program Discussion of progress toward goals PSFS NDI Manual therapy to bil upper traps, periscapular musculature Trigger Point Dry-Needling  Treatment instructions: Expect mild to moderate muscle soreness. S/S of pneumothorax if dry needled over a lung field, and to seek immediate medical attention should they occur. Patient verbalized understanding of these instructions and education.  Patient Consent Given: Yes Education handout  provided: Previously provided Muscles treated:  bil pecs; bil upper traps Electrical stimulation performed: No Parameters: N/A Treatment response/outcome: dec tender point size/number, twitch response and decreased muscle tension  07/26/23 Nu-Step L5 ( green machine) 5 min Push ups on the railing 10x Lat bar 30# 10x Cable row 12# 10x (verbal cues for slight knee bend and abdominal draw in) Manual therapy to bil upper traps, periscapular musculature Trigger Point Dry-Needling  Treatment instructions: Expect mild to moderate muscle soreness. S/S of pneumothorax if dry needled over a lung field, and to seek immediate medical attention should they occur. Patient verbalized understanding of these instructions and education.  Patient Consent Given: Yes Education handout provided: Previously provided Muscles treated:  bil teres; bil upper traps;  bil levator scap; bil subscapularis, bil posterior deltoid Electrical stimulation performed: No Parameters: N/A Treatment response/outcome: dec tender point size/number, twitch response and decreased muscle tension  Heat following DN after session 12/10 Nu-Step L5 ( green machine) 5 min Discussion of exercise and for general health and well being (longevity of life, decreased hospitalizations, dec chronic disease etc) it's like health insurance benefit of a trainer for accountability  Discussed and given notes about starting with low reps 10 and under, low volume at first and key machines: lat bar, leg press, row, triceps Lat bar 30# 10x Cable row 10# 10x Blue loop wall slides with lift off at the top 10x Manual therapy to bil upper traps, periscapular musculature Trigger Point Dry-Needling  Treatment instructions: Expect mild to moderate muscle soreness. S/S of pneumothorax if dry needled over a lung field, and to seek immediate medical attention should they occur. Patient verbalized understanding of these instructions and education.  Patient  Consent Given: Yes Education handout provided: Previously provided Muscles treated:  bil teres; bil upper traps;  bil levator scap; bil subscapularis, bil posterior deltoid Electrical stimulation performed: No Parameters: N/A Treatment response/outcome: dec tender point size/number, twitch response and decreased muscle tension  12/6 Nu-Step L5 ( green machine) 5 min Prone press ups 5x  Prone over 1 pillow: hip extension 5x (added to HEP) Prone over 1 pillow: bil shoulder extension palms down (added to HEP) Prone over 1 pillow: bil shoulder horizontal abduction (added to HEP) Open books 5x right/left with leg  on foam roll  Manual therapy to bil upper traps, periscapular musculature Trigger Point Dry-Needling  Treatment instructions: Expect mild to moderate muscle soreness. S/S of pneumothorax if dry needled over a lung field, and to seek immediate medical attention should they occur. Patient verbalized understanding of these instructions and education.  Patient Consent Given: Yes Education handout provided: Previously provided Muscles treated:  bil pecs, bil teres; bil upper traps;  bil levator scap Electrical stimulation performed: No Parameters: N/A Treatment response/outcome: dec tender point size/number, twitch response and decreased muscle tension  Heat following DN 11/26 Nu-Step L3 ( blue machine) 5 min NDI Thread the needle green band with other hand in the chair 10x right/left  Standing 35# lat pulls 5x  3# alternating overhead press 10x Green loop wall lift offs 10x Manual therapy to bil upper traps, periscapular musculature Trigger Point Dry-Needling  Treatment instructions: Expect mild to moderate muscle soreness. S/S of pneumothorax if dry needled over a lung field, and to seek immediate medical attention should they occur. Patient verbalized understanding of these instructions and education.  Patient Consent Given: Yes Education handout provided: Previously  provided Muscles treated:  bil upper traps;bil teres, bil levator scap; right rhomboids; right subscapularis; bil posterior deltoids Electrical stimulation performed: No Parameters: N/A Treatment response/outcome: dec tender point size/number, twitch response and decreased muscle tension  Heat following DN         PATIENT EDUCATION:  Education details:  HEP Person educated: Patient Education method: Programmer, Multimedia, Facilities Manager, Verbal cues, and Handouts Education comprehension: verbalized understanding, returned demonstration, and verbal cues required  HOME EXERCISE PROGRAM: Access Code: [email or web address] URL: [email or web address] Date: 07/12/2023 Prepared by: Glade Pesa  Exercises - Supine Chest Stretch on Foam Roll  - 1 x daily - 7 x weekly - 3 sets - 10 reps - Thoracic Extension Mobilization on Foam Roll  - 1 x daily - 7 x weekly - 3 sets - 10 reps - Supine Shoulder Horizontal Abduction with Resistance  - 1 x daily - 7 x weekly - 3 sets - 10 reps - Supine PNF D2 Flexion with Resistance  - 1 x daily - 7 x weekly - 3 sets - 10 reps - Latissimus Dorsi Stretch at Wall  - 2 x daily - 7 x weekly - 1 sets - 2-3 reps - 20 hold - Prone Chest Stretch on Chair  - 1 x daily - 7 x weekly - 1 sets - 2-3 reps - 20 hold - Standing Thoracic Open Book at Wall  - 1 x daily - 7 x weekly - 1 sets - 5 reps - Standing Cervical Retraction  - 3-4 x daily - 7 x weekly - 1 sets - 5 reps - Wall Push Up  - 1 x daily - 7 x weekly - 1 sets - 10 reps - Wall Angels  - 1 x daily - 7 x weekly - 1 sets - 10 reps - Shoulder Overhead Press in Flexion with Dumbbells  - 1 x daily - 7 x weekly - 2 sets - 5 reps - Squat with Chair Touch  - 1 x daily - 7 x weekly - 2 sets - 5 reps - Half Deadlift with Kettlebell  - 1 x daily - 7 x weekly - 2 sets - 5 reps - Farmer's Carry with Kettlebells  - 1 x daily - 7 x weekly - 1 sets - 10 reps - Standing Hip Circles  - 1 x daily - 7 x weekly - 1 sets -  10 reps - Seated  Assisted Cervical Rotation with Towel  - 1 x daily - 7 x weekly - 1 sets - 10 reps - Cervical Extension AROM with Strap  - 1 x daily - 7 x weekly - 1 sets - 10 reps - Supine Diaphragmatic Breathing  - 1 x daily - 7 x weekly - 1 sets - 10 reps - Doorway Pec Stretch at 90 Degrees Abduction  - 1 x daily - 7 x weekly - 2 sets - 2 reps - 20 hold - Doorway Pec Stretch at 120 Degrees Abduction  - 1 x daily - 7 x weekly - 2 sets - 2 reps - 20 hold - Standing Quadratus Lumborum Stretch with Doorway  - 1 x daily - 7 x weekly - 2 sets - 2 reps - 20 hold - Standing Lat Pull Down with Resistance - Elbows Bent  - 1 x daily - 7 x weekly - 1 sets - 10 reps - Shoulder extension with resistance - Neutral  - 1 x daily - 7 x weekly - 1 sets - 10 reps - Standing Plank on Wall  - 1 x daily - 7 x weekly - 1 sets - 5 reps - 5 hold - Standing Full Side Plank on Wall  - 1 x daily - 7 x weekly - 1 sets - 3 reps - 5 hold - Prone Hip Extension - One Pillow  - 1 x daily - 7 x weekly - 1 sets - 5 reps - Prone Shoulder Extension  - 1 x daily - 7 x weekly - 1 sets - 5 reps - Prone Scapular Retraction Arms at Side  - 1 x daily - 7 x weekly - 1 sets - 5 reps -ASSESSMENT:  CLINICAL IMPRESSION: Kelicia has dramatically improved since start of care with much improved PSFS ratings of functional activities and Neck disability Index improved from 80% on initial evaluation to 36% today.  She continues regular swimming and has initiated a strengthening program.  The patient benefits significantly from dry needling and manual therapy to stimulate underlying myofascial trigger points and muscular tissue for management of neuromusculoskeletal pain and address movement impairments.  Will taper visits with plan to finalize independence with HEP and discharge in 3-4 visits.       OBJECTIVE IMPAIRMENTS: increased fascial restrictions and increased muscle spasms.   ACTIVITY LIMITATIONS: carrying, lifting, bending, sitting, squatting, sleeping,  transfers, bed mobility, bathing, dressing, and hygiene/grooming  PARTICIPATION LIMITATIONS: meal prep, cleaning, laundry, interpersonal relationship, driving, shopping, community activity, and occupation  PERSONAL FACTORS: Education, Past/current experiences, and 3+ comorbidities: anxiety , depression and chronic pain  are also affecting patient's functional outcome.   REHAB POTENTIAL: Fair    CLINICAL DECISION MAKING: Unstable/unpredictable  EVALUATION COMPLEXITY: High   GOALS: Goals reviewed with patient? Yes  SHORT TERM GOALS: Target date: 04/24/2023   Patient to report pain no greater than 2/10  Baseline:  Goal status: ongoing  2.  Patient will be independent with initial HEP  Baseline:  Goal status: met 9/17    LONG TERM GOALS: Target date:3/  11  /25   Patient to report an overall improvement at 60% with home and work ADLs Baseline:  Goal status: revised  2.  Patient to be independent with advanced HEP  Baseline:  Goal status:ongoing  3.  Patient to be able to sleep through the night  Baseline:  Goal status: ongoing  4.  Patient to be able to return to work Baseline:  Goal status: met 10/15  5.  Neck pain disablity score to improve by 5 Baseline:  Goal status: met 10/15  6.  Patient to report PSFS score of 6 with doing housework for extended period of time Baseline:  Goal status: met 1/14  7.  Patient will report PSFS of 8 with driving kids to and from school ongoing  8. PSFS of 6 with stamina to work a long shift Met 1/14   PLAN:  PT FREQUENCY: every other week  PT DURATION: 8 weeks  PLANNED INTERVENTIONS: Therapeutic exercises, Therapeutic activity, Neuromuscular re-education, Balance training, Gait training, Patient/Family education, Self Care, Joint mobilization, Vestibular training, Canalith repositioning, Aquatic Therapy, Dry Needling, Electrical stimulation, Spinal mobilization, Cryotherapy, Moist heat, Taping, Vasopneumatic device,  Traction, Ultrasound, Ionotophoresis 4mg /ml Dexamethasone , and Manual therapy  PLAN FOR NEXT SESSION:  benefit of exercise/resistance training handout; DN;  Nu-step, progress postural strengthening; resistance training upper quarter; taper visits and plan for discharge in 3-4 visits  Glade Pesa, PT 08/20/23 5:29 PM Phone: [phone] Fax: [phone]

## 2023-08-29 ENCOUNTER — Ambulatory Visit: Payer: Managed Care, Other (non HMO) | Admitting: Physical Therapy

## 2023-08-29 ENCOUNTER — Encounter: Payer: Self-pay | Admitting: Physical Therapy

## 2023-08-29 DIAGNOSIS — R252 Cramp and spasm: Secondary | ICD-10-CM

## 2023-08-29 DIAGNOSIS — M6281 Muscle weakness (generalized): Secondary | ICD-10-CM | POA: Diagnosis not present

## 2023-08-29 DIAGNOSIS — R279 Unspecified lack of coordination: Secondary | ICD-10-CM

## 2023-08-29 DIAGNOSIS — M62838 Other muscle spasm: Secondary | ICD-10-CM

## 2023-08-29 DIAGNOSIS — M546 Pain in thoracic spine: Secondary | ICD-10-CM

## 2023-08-29 DIAGNOSIS — M542 Cervicalgia: Secondary | ICD-10-CM

## 2023-08-29 NOTE — Therapy (Signed)
OUTPATIENT PHYSICAL THERAPY FEMALE PELVIC TREATMENT   Patient Name: Adrienne Williams MRN: [id number] DOB:06-01-1981, 43 y.o., female Today's Date: 08/29/2023  END OF SESSION:  PT End of Session - 08/29/23 1039     Visit Number 28    Date for PT Re-Evaluation 10/15/23    Authorization Type [employer]    PT Start Time 1015    PT Stop Time 1100    PT Time Calculation (min) 45 min    Activity Tolerance Patient tolerated treatment well    Behavior During Therapy [facility] for tasks assessed/performed                  Past Medical History:  Diagnosis Date   Anxiety    Asthma    Depression    Family history of breast cancer    Frank breech presentation 05/29/2018   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   Hemorrhoids    Hx of varicella    PONV (postoperative nausea and vomiting)    "little nauseated"   Postpartum care following cesarean delivery (6/14) 01/18/2016   Past Surgical History:  Procedure Laterality Date   BREAST BIOPSY Right 02/25/2023   CESAREAN SECTION N/A 01/18/2016   Procedure: Primary CESAREAN SECTION;  Surgeon: Genia Del, MD;  Location: [facility];  Service: Obstetrics;  Laterality: N/A;  EDD: 01/25/16   CESAREAN SECTION N/A 05/29/2018   Procedure: Repeat CESAREAN SECTION;  Surgeon: Shea Evans, MD;  Location: [facility];  Service: Obstetrics;  Laterality: N/A;  EDD: 06/10/18   COLONOSCOPY     CYSTECTOMY  2002   pilonidal region   DIAGNOSTIC LAPAROSCOPY WITH REMOVAL OF ECTOPIC PREGNANCY Right 05/29/2020   Procedure: DIAGNOSTIC LAPAROSCOPY WITH REMOVAL OF ECTOPIC PREGNANCY;  Surgeon: Shea Evans, MD;  Location: [facility] OR;  Service: Gynecology;  Laterality: Right;   MOLE REMOVAL     UNILATERAL SALPINGECTOMY Right 05/29/2020   Procedure: UNILATERAL SALPINGECTOMY;  Surgeon: Shea Evans, MD;  Location: [facility] OR;  Service: Gynecology;  Laterality: Right;   WISDOM TOOTH EXTRACTION     Patient Active Problem List    Diagnosis Date Noted   Poor sleep 05/22/2023   Neck pain, chronic 06/27/2022   Fibromyalgia 06/27/2022   Myofascial pain 06/27/2022   Internal hemorrhoids 06/13/2022   Rectal bleeding 06/13/2022   Family history of breast cancer 01/18/2022   Monoallelic mutation of CHEK2 gene in female patient 01/18/2022   Anxiety and depression 05/31/2018   Rh negative, maternal 05/31/2018   Homero Fellers breech presentation 05/29/2018   Status post repeat low transverse cesarean section 05/29/2018   Postpartum care following cesarean delivery (10/24) 05/29/2018    PCP: Jarrett Soho, PA-C PCP - General  REFERRING PROVIDER: Vick Frees, MD Ref Provider  REFERRING DIAG: N39.3 (ICD-10-CM) - Stress incontinence (female) (female)  THERAPY DIAG:  Muscle weakness (generalized)  Other muscle spasm  Cramp and spasm  Pain in thoracic spine  Cervicalgia  Unspecified lack of coordination  Rationale for Evaluation and Treatment: Rehabilitation  ONSET DATE: 2019  SUBJECTIVE:  Pt reports that she feels fair physically Leaked with running little bit yesterday, wants to focus on that Tried to tighten her abs while running Does some swimming  SUBJECTIVE STATEMENT:  Fluid intake: Yes: mostly water, seltzer, liquid IV    PAIN:  Are you having pain? Yes NPRS scale: 3/10 Pain location: External  Pain type: throbbing, tight, and tingling Pain description: constant, stabbing if super tight  Aggravating factors: walking or running, being on her feet, sitting, standing Relieving factors: diaphragmatic breathing, lidocaine patches PRECAUTIONS: None  RED FLAGS: None   WEIGHT BEARING RESTRICTIONS: No  FALLS:  Has patient fallen in last 6 months? No  LIVING ENVIRONMENT: Lives with: lives with their family Lives in: House/apartment Stairs: No Has following  equipment at home: None  OCCUPATION: pharmacist- retail, very difficult right now  PLOF: Independent  PATIENT GOALS: to be able to relieve pain without having to spend a ton of time on it, has 2 young kids and a job. Pain is a priority. Rare that the leaking happens.   PERTINENT HISTORY:   Sexual abuse: No  BOWEL MOVEMENT: Pain with bowel movement: Yes when stuff is super tight Type of bowel movement:Strain Yes sometimes Fully empty rectum: Yes: when she goes Leakage: No only when the hemorrhoid is really bad, it was crazy, it' better now Pads: No Fiber supplement: No  URINATION: Pain with urination: No Fully empty bladder: No sometimes feels like she struggles Stream: Weak Urgency: No, used to Frequency: not now Leakage: Coughing, Sneezing, and Exercise, running Pads: Yes:    INTERCOURSE: Pain with intercourse: Initial Penetration, During Penetration, After Intercourse, During Climax, and Pain Interrupts Intercourse Ability to have vaginal penetration:  Yes: has not in a while Climax: yes Marinoff Scale: 2/3  PREGNANCY: Vaginal deliveries 0 Tearing No C-section deliveries 2 Currently pregnant No  PROLAPSE: Check with internal   OBJECTIVE:  Note: Objective measures were completed at Evaluation unless otherwise noted.   COGNITION: Overall cognitive status: Within functional limits for tasks assessed     SENSATION: Light touch: Appears intact Proprioception: Appears intact  MUSCLE LENGTH: Hamstrings: tight bilateral Thomas test: Right tight ; Left tight    POSTURE: rounded shoulders and forward head  PELVIC ALIGNMENT: even  LUMBARAROM/PROM:  A/PROM A/PROM  eval  Flexion limited  Extension limited  Right lateral flexion limited  Left lateral flexion   Right rotation   Left rotation    (Blank rows = not tested)  LOWER EXTREMITY ROM:  Passive ROM Right eval Left eval  Hip flexion limited limited  Hip extension    Hip abduction limited  limited  Hip adduction    Hip internal rotation    Hip external rotation    Knee flexion    Knee extension    Ankle dorsiflexion    Ankle plantarflexion    Ankle inversion    Ankle eversion     (Blank rows = not tested)  LOWER EXTREMITY MMT: 4/5 grossly overall  PALPATION:   General  upper chest breathing and restrictions throughout abdomen,  Trigger points throughout glutes and abdomen- more on right,  Tight umbilical scars                External Perineal Exam wfl                             Internal Pelvic Floor decreased strength, high tone, tight and tender throughout Patient confirms identification and approves PT to assess internal pelvic floor and treatment Yes  PELVIC MMT:   MMT eval  Vaginal 2/5  Internal Anal Sphincter 5/5  External Anal Sphincter 5/5  Puborectalis   Diastasis Recti Yes- 1 finger above umbilicus  (Blank rows = not tested)        TONE: high  PROLAPSE: no TODAY'S TREATMENT:                                                                                                                              DATE: 08/29/23  Manual- dry needling lumbar paraspinals and bilateral glutes  Therapeutic exercises- side stepping- 2 laps Single leg squat 15x4 Bridging 20 reps Ball press supine - difficult    Treatment instructions: First Select Initial or Subsequent and then (Multi-select), will start with all checked Initial Treatment: Pt instructed on dry needling rationale, procedures, and possible side effects. Pt instructed to expect mild to moderate muscle soreness later today and/or tomorrow. Pt instructed in methods to reduce muscle soreness. Pt instructed to continue prescribed HEP. Because dry needling was performed over or adjacent to a lung field, pt was educated on S/S of pneumothorax and to seek immediate medical attention should they occur.  Patient was educated on signs and symptoms of infection and other risk factors and advised to seek  medical attention should they occur. Patient verbalized understanding of these instructions and education. Subsequent Treatment Instructions provided previously at initial dry needling treatment Instructions reviewed, if requested by the patient, prior to subsequent dry needling treatment  Patient Verbal Consent Given: Yes  Education handout provided: Yes Muscles treated: see above Electrical stimulation performed: No  Parameters: N/A (N/A, ) (Will change to only populate if yes is selected) Treatment response/outcome: less pain, spasm and increased ROM  PATIENT EDUCATION:  Education details/ manual performed: scar massage and cupping Person educated: Patient Education method: Programmer, multimedia, Demonstration, and Handouts Education comprehension: verbalized understanding, returned demonstration, verbal cues required, tactile cues required, and needs further education    HOME EXERCISE PROGRAM:   [id number]  ASSESSMENT:  CLINICAL IMPRESSION: Pt with TPs bilateral glutes and lumbar paraspinals. Tx focus-core and hip/ pelvic floor strengthening for improved form and less leaking with running. Pt will continue to benefir from pelvic PT.   OBJECTIVE IMPAIRMENTS: decreased activity tolerance, decreased coordination, decreased endurance, decreased mobility, decreased ROM, decreased strength, increased fascial restrictions, increased muscle spasms, impaired flexibility, and pain.   ACTIVITY LIMITATIONS: bending  PARTICIPATION LIMITATIONS: interpersonal relationship, occupation, and yard work  PERSONAL FACTORS: Time since onset of injury/illness/exacerbation are also affecting patient's functional outcome.   REHAB POTENTIAL: Good  CLINICAL DECISION MAKING: Stable/uncomplicated  EVALUATION COMPLEXITY: Low   GOALS: updated 07/22/2023 Goals reviewed with patient? Yes  SHORT TERM GOALS: Target date: 06/10/2023 updated 09/16/2023    Pt will be independent with HEP.   Baseline: not  Goal  status: not yet  2.  Pt will be able to run/workout for 30 minutes without leakage or discomfort  Baseline:   Goal status: leakage is worse because she is not as tense  3.  Pt will be independent with diaphragmatic breathing and down training  activities in order to improve pelvic floor relaxation.  Baseline: no Goal status: progressing  LONG TERM GOALS: Target date: 08/05/2023 - updated 09/16/2023      Pt will be independent with advanced HEP.   Baseline: not yet Goal status: progressing  2.  Pt will report decreased pelvic pain to max 3/10  Baseline:  Goal status: INITIAL  3.  Pt will report reduced pain abdominal scars to max 3/10 Baseline:  Goal status: improving  4.  Pt will soak 0 pads/ day Baseline:  Goal status: INITIAL   PLAN:  PT FREQUENCY: 1-2x/week  PT DURATION: 8 weeks  PLANNED INTERVENTIONS: Therapeutic exercises, Therapeutic activity, Neuromuscular re-education, Balance training, Gait training, Patient/Family education, Self Care, Joint mobilization, Dry Needling, Electrical stimulation, Spinal manipulation, Spinal mobilization, scar mobilization, Biofeedback, and Manual therapy  PLAN FOR NEXT SESSION: core strengthening for SUI,  Tuwanna Krausz, PT 08/29/23 11:07 AM

## 2023-09-02 ENCOUNTER — Encounter: Payer: Self-pay | Admitting: Genetic Counselor

## 2023-09-02 DIAGNOSIS — Z1379 Encounter for other screening for genetic and chromosomal anomalies: Secondary | ICD-10-CM | POA: Insufficient documentation

## 2023-09-03 ENCOUNTER — Ambulatory Visit: Payer: Managed Care, Other (non HMO) | Admitting: Physical Therapy

## 2023-09-12 ENCOUNTER — Encounter: Payer: Self-pay | Admitting: Physical Therapy

## 2023-09-12 ENCOUNTER — Ambulatory Visit: Payer: Managed Care, Other (non HMO) | Attending: Obstetrics and Gynecology | Admitting: Physical Therapy

## 2023-09-12 DIAGNOSIS — M542 Cervicalgia: Secondary | ICD-10-CM | POA: Insufficient documentation

## 2023-09-12 DIAGNOSIS — R279 Unspecified lack of coordination: Secondary | ICD-10-CM | POA: Insufficient documentation

## 2023-09-12 DIAGNOSIS — R252 Cramp and spasm: Secondary | ICD-10-CM | POA: Insufficient documentation

## 2023-09-12 DIAGNOSIS — M6281 Muscle weakness (generalized): Secondary | ICD-10-CM | POA: Diagnosis present

## 2023-09-12 DIAGNOSIS — M62838 Other muscle spasm: Secondary | ICD-10-CM | POA: Insufficient documentation

## 2023-09-12 DIAGNOSIS — M546 Pain in thoracic spine: Secondary | ICD-10-CM | POA: Insufficient documentation

## 2023-09-12 NOTE — Therapy (Signed)
 OUTPATIENT PHYSICAL THERAPY FEMALE PELVIC TREATMENT   Patient Name: Adrienne Williams MRN: [id number] DOB:[birth year], 43 y.o., female Today's Date: 09/12/2023  END OF SESSION:  PT End of Session - 09/12/23 1509     Visit Number 29    Date for PT Re-Evaluation 10/15/23    Authorization Type [employer]    PT Start Time 0245    PT Stop Time 0330    PT Time Calculation (min) 45 min    Activity Tolerance Patient tolerated treatment well    Behavior During Therapy [facility] for tasks assessed/performed                   Past Medical History:  Diagnosis Date   Anxiety    Asthma    Depression    Family history of breast cancer    Frank breech presentation 05/29/2018   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   Hemorrhoids    Hx of varicella    PONV (postoperative nausea and vomiting)    little nauseated   Postpartum care following cesarean delivery (6/14) 01/18/2016   Past Surgical History:  Procedure Laterality Date   BREAST BIOPSY Right 02/25/2023   CESAREAN SECTION N/A 01/18/2016   Procedure: Primary CESAREAN SECTION;  Surgeon: Percilla Burly, MD;  Location: [facility];  Service: Obstetrics;  Laterality: N/A;  EDD: 01/25/16   CESAREAN SECTION N/A 05/29/2018   Procedure: Repeat CESAREAN SECTION;  Surgeon: Barbette Knock, MD;  Location: [facility];  Service: Obstetrics;  Laterality: N/A;  EDD: 06/10/18   COLONOSCOPY     CYSTECTOMY  2002   pilonidal region   DIAGNOSTIC LAPAROSCOPY WITH REMOVAL OF ECTOPIC PREGNANCY Right 05/29/2020   Procedure: DIAGNOSTIC LAPAROSCOPY WITH REMOVAL OF ECTOPIC PREGNANCY;  Surgeon: Barbette Knock, MD;  Location: [facility] OR;  Service: Gynecology;  Laterality: Right;   MOLE REMOVAL     UNILATERAL SALPINGECTOMY Right 05/29/2020   Procedure: UNILATERAL SALPINGECTOMY;  Surgeon: Barbette Knock, MD;  Location: [facility] OR;  Service: Gynecology;  Laterality: Right;   WISDOM TOOTH EXTRACTION     Patient Active Problem List    Diagnosis Date Noted   Genetic testing 09/02/2023   Poor sleep 05/22/2023   Neck pain, chronic 06/27/2022   Fibromyalgia 06/27/2022   Myofascial pain 06/27/2022   Internal hemorrhoids 06/13/2022   Rectal bleeding 06/13/2022   Family history of breast cancer 01/18/2022   Monoallelic mutation of CHEK2 gene in female patient 01/18/2022   Anxiety and depression 05/31/2018   Rh negative, maternal 05/31/2018   Dempsey breech presentation 05/29/2018   Status post repeat low transverse cesarean section 05/29/2018   Postpartum care following cesarean delivery (10/24) 05/29/2018    PCP: Katina Pfeiffer, PA-C PCP - General  REFERRING PROVIDER: Linnell Devere BRAVO, MD Ref Provider  REFERRING DIAG: N39.3 (ICD-10-CM) - Stress incontinence (female) (female)  THERAPY DIAG:  Muscle weakness (generalized)  Other muscle spasm  Cramp and spasm  Pain in thoracic spine  Cervicalgia  Unspecified lack of coordination  Rationale for Evaluation and Treatment: Rehabilitation  ONSET DATE: 2019  SUBJECTIVE:  Pt reports that she is sleepy. Tired. Got out of bed for this.  Tried jump roping and running, leaked a little Pull ups made her leak  SUBJECTIVE STATEMENT:  Fluid intake: Yes: mostly water, seltzer, liquid IV    PAIN:  Are you having pain? Yes NPRS scale: 3/10 Pain location: External  Pain type: throbbing, tight, and tingling Pain description: constant, stabbing if super tight  Aggravating factors: walking or running, being on her feet, sitting, standing Relieving factors: diaphragmatic breathing, lidocaine  patches PRECAUTIONS: None  RED FLAGS: None   WEIGHT BEARING RESTRICTIONS: No  FALLS:  Has patient fallen in last 6 months? No  LIVING ENVIRONMENT: Lives with: lives with their family Lives in: House/apartment Stairs: No Has following  equipment at home: None  OCCUPATION: pharmacist- retail, very difficult right now  PLOF: Independent  PATIENT GOALS: to be able to relieve pain without having to spend a ton of time on it, has 2 young kids and a job. Pain is a priority. Rare that the leaking happens.   PERTINENT HISTORY:   Sexual abuse: No  BOWEL MOVEMENT: Pain with bowel movement: Yes when stuff is super tight Type of bowel movement:Strain Yes sometimes Fully empty rectum: Yes: when she goes Leakage: No only when the hemorrhoid is really bad, it was crazy, it' better now Pads: No Fiber supplement: No  URINATION: Pain with urination: No Fully empty bladder: No sometimes feels like she struggles Stream: Weak Urgency: No, used to Frequency: not now Leakage: Coughing, Sneezing, and Exercise, running Pads: Yes:    INTERCOURSE: Pain with intercourse: Initial Penetration, During Penetration, After Intercourse, During Climax, and Pain Interrupts Intercourse Ability to have vaginal penetration:  Yes: has not in a while Climax: yes Marinoff Scale: 2/3  PREGNANCY: Vaginal deliveries 0 Tearing No C-section deliveries 2 Currently pregnant No  PROLAPSE: Check with internal   OBJECTIVE:  Note: Objective measures were completed at Evaluation unless otherwise noted.   COGNITION: Overall cognitive status: Within functional limits for tasks assessed     SENSATION: Light touch: Appears intact Proprioception: Appears intact  MUSCLE LENGTH: Hamstrings: tight bilateral Thomas test: Right tight ; Left tight    POSTURE: rounded shoulders and forward head  PELVIC ALIGNMENT: even  LUMBARAROM/PROM:  A/PROM A/PROM  eval  Flexion limited  Extension limited  Right lateral flexion limited  Left lateral flexion   Right rotation   Left rotation    (Blank rows = not tested)  LOWER EXTREMITY ROM:  Passive ROM Right eval Left eval  Hip flexion limited limited  Hip extension    Hip abduction limited  limited  Hip adduction    Hip internal rotation    Hip external rotation    Knee flexion    Knee extension    Ankle dorsiflexion    Ankle plantarflexion    Ankle inversion    Ankle eversion     (Blank rows = not tested)  LOWER EXTREMITY MMT: 4/5 grossly overall  PALPATION:   General  upper chest breathing and restrictions throughout abdomen,  Trigger points throughout glutes and abdomen- more on right,  Tight umbilical scars                External Perineal Exam wfl                             Internal Pelvic Floor decreased strength, high tone, tight and tender throughout Patient confirms identification and approves PT to assess internal pelvic floor and treatment Yes  PELVIC MMT:   MMT eval  Vaginal 2/5  Internal Anal Sphincter 5/5  External Anal Sphincter 5/5  Puborectalis   Diastasis Recti Yes- 1 finger above umbilicus  (Blank rows = not tested)        TONE: high  PROLAPSE: no TODAY'S TREATMENT:                                                                                                                              DATE: 09/12/23  Manual- dry needling lumbar paraspinals, adductors and bilateral glutes and STM There ex- straight leg stretch, pigeon and figure four stretch/ QL stretch   Treatment instructions: First Select Initial or Subsequent and then (Multi-select), will start with all checked Initial Treatment: Pt instructed on dry needling rationale, procedures, and possible side effects. Pt instructed to expect mild to moderate muscle soreness later today and/or tomorrow. Pt instructed in methods to reduce muscle soreness. Pt instructed to continue prescribed HEP. Because dry needling was performed over or adjacent to a lung field, pt was educated on S/S of pneumothorax and to seek immediate medical attention should they occur.  Patient was educated on signs and symptoms of infection and other risk factors and advised to seek medical attention should they  occur. Patient verbalized understanding of these instructions and education. Subsequent Treatment Instructions provided previously at initial dry needling treatment Instructions reviewed, if requested by the patient, prior to subsequent dry needling treatment  Patient Verbal Consent Given: Yes  Education handout provided: Yes Muscles treated: see above Electrical stimulation performed: No  Parameters: N/A Treatment response/outcome: less pain, spasm and increased ROM   PATIENT EDUCATION:  Education details/ manual performed: scar massage and cupping Person educated: Patient Education method: Programmer, Multimedia, Demonstration, and Handouts Education comprehension: verbalized understanding, returned demonstration, verbal cues required, tactile cues required, and needs further education    HOME EXERCISE PROGRAM:   [id number]  ASSESSMENT:  CLINICAL IMPRESSION: Pt with TPs bilateral glutes and lumbar paraspinals. Tx focus- hip/ pelvic floor stretching. Pt has been more active, riding horses, swimming and workout out. Progressing well.    OBJECTIVE IMPAIRMENTS: decreased activity tolerance, decreased coordination, decreased endurance, decreased mobility, decreased ROM, decreased strength, increased fascial restrictions, increased muscle spasms, impaired flexibility, and pain.   ACTIVITY LIMITATIONS: bending  PARTICIPATION LIMITATIONS: interpersonal relationship, occupation, and yard work  PERSONAL FACTORS: Time since onset of injury/illness/exacerbation are also affecting patient's functional outcome.   REHAB POTENTIAL: Good  CLINICAL DECISION MAKING: Stable/uncomplicated  EVALUATION COMPLEXITY: Low   GOALS: updated 07/22/2023 Goals reviewed with patient? Yes  SHORT TERM GOALS: Target date: 06/10/2023 updated 09/16/2023    Pt will be independent with HEP.   Baseline: not  Goal status: not yet  2.  Pt will be able to run/workout for 30 minutes without leakage or discomfort   Baseline:   Goal status: leakage is worse because she is not as tense  3.  Pt will be independent with diaphragmatic breathing and down training activities in order to improve pelvic floor relaxation.  Baseline: no Goal status: progressing  LONG TERM GOALS:  Target date: 08/05/2023 - updated 09/16/2023      Pt will be independent with advanced HEP.   Baseline: not yet Goal status: progressing  2.  Pt will report decreased pelvic pain to max 3/10  Baseline:  Goal status: INITIAL  3.  Pt will report reduced pain abdominal scars to max 3/10 Baseline:  Goal status: improving  4.  Pt will soak 0 pads/ day Baseline:  Goal status: progressing   PLAN:  PT FREQUENCY: 1-2x/week  PT DURATION: 8 weeks  PLANNED INTERVENTIONS: Therapeutic exercises, Therapeutic activity, Neuromuscular re-education, Balance training, Gait training, Patient/Family education, Self Care, Joint mobilization, Dry Needling, Electrical stimulation, Spinal manipulation, Spinal mobilization, scar mobilization, Biofeedback, and Manual therapy  PLAN FOR NEXT SESSION: core strengthening for SUI Kristina Mcnorton, PT 09/12/23 3:11 PM

## 2023-09-20 ENCOUNTER — Ambulatory Visit: Payer: Managed Care, Other (non HMO) | Admitting: Physical Therapy

## 2023-09-20 DIAGNOSIS — M6281 Muscle weakness (generalized): Secondary | ICD-10-CM | POA: Diagnosis not present

## 2023-09-20 DIAGNOSIS — M542 Cervicalgia: Secondary | ICD-10-CM

## 2023-09-20 DIAGNOSIS — M62838 Other muscle spasm: Secondary | ICD-10-CM

## 2023-09-20 DIAGNOSIS — M546 Pain in thoracic spine: Secondary | ICD-10-CM

## 2023-09-20 NOTE — Therapy (Signed)
OUTPATIENT PHYSICAL THERAPY CERVICAL TREATMENT NOTE   Patient Name: Adrienne Williams MRN: [id number] DOB:10-Jun-1981, 43 y.o., female Today's Date: 09/20/2023  END OF SESSION:  PT End of Session - 09/20/23 0846     Visit Number 27    Date for PT Re-Evaluation 10/15/23    Authorization Type [employer]    PT Start Time 0846    PT Stop Time 0927    PT Time Calculation (min) 41 min    Activity Tolerance Patient tolerated treatment well              Past Medical History:  Diagnosis Date   Anxiety    Asthma    Depression    Family history of breast cancer    Homero Fellers breech presentation 05/29/2018   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   Hemorrhoids    Hx of varicella    PONV (postoperative nausea and vomiting)    "little nauseated"   Postpartum care following cesarean delivery (6/14) 01/18/2016   Past Surgical History:  Procedure Laterality Date   BREAST BIOPSY Right 02/25/2023   CESAREAN SECTION N/A 01/18/2016   Procedure: Primary CESAREAN SECTION;  Surgeon: Genia Del, MD;  Location: [facility];  Service: Obstetrics;  Laterality: N/A;  EDD: 01/25/16   CESAREAN SECTION N/A 05/29/2018   Procedure: Repeat CESAREAN SECTION;  Surgeon: Shea Evans, MD;  Location: [facility];  Service: Obstetrics;  Laterality: N/A;  EDD: 06/10/18   COLONOSCOPY     CYSTECTOMY  2002   pilonidal region   DIAGNOSTIC LAPAROSCOPY WITH REMOVAL OF ECTOPIC PREGNANCY Right 05/29/2020   Procedure: DIAGNOSTIC LAPAROSCOPY WITH REMOVAL OF ECTOPIC PREGNANCY;  Surgeon: Shea Evans, MD;  Location: [facility] OR;  Service: Gynecology;  Laterality: Right;   MOLE REMOVAL     UNILATERAL SALPINGECTOMY Right 05/29/2020   Procedure: UNILATERAL SALPINGECTOMY;  Surgeon: Shea Evans, MD;  Location: [facility] OR;  Service: Gynecology;  Laterality: Right;   WISDOM TOOTH EXTRACTION     Patient Active Problem List   Diagnosis Date Noted   Genetic testing 09/02/2023   Poor sleep  05/22/2023   Neck pain, chronic 06/27/2022   Fibromyalgia 06/27/2022   Myofascial pain 06/27/2022   Internal hemorrhoids 06/13/2022   Rectal bleeding 06/13/2022   Family history of breast cancer 01/18/2022   Monoallelic mutation of CHEK2 gene in female patient 01/18/2022   Anxiety and depression 05/31/2018   Rh negative, maternal 05/31/2018   Homero Fellers breech presentation 05/29/2018   Status post repeat low transverse cesarean section 05/29/2018   Postpartum care following cesarean delivery (10/24) 05/29/2018    PCP: Jarrett Soho, PA-C   REFERRING PROVIDER: Jackelyn Poling, DO  REFERRING DIAG: [phone] (ICD-10-CM) - Other muscle spasm  THERAPY DIAG:  Muscle weakness (generalized)  Other muscle spasm  Pain in thoracic spine  Cervicalgia  Rationale for Evaluation and Treatment: Rehabilitation  ONSET DATE: 03/26/2023  SUBJECTIVE:  SUBJECTIVE STATEMENT: Loving private swim class and horseback riding lessons. Still training with her brother.  I'm tense this morning.  Right now I need to lift and do DN today.  Running a little.   EVAL: Patient reports she was helping her husband install garage seal on 03-10-23.  She felt severe pain in thoracic spine and neck onset around 03-13-23 and explains that she noticed that she had a lot of swelling and had to get a bigger bra and extender for her bra due to the swelling.  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  Difficulty with driving, sleeping, IADL's self care and working.  I have two kids and caring for them and the dogs has been very challenging.  I am usually very active but unable to do anything right now.  Hand dominance: Right  PERTINENT HISTORY:  na  PAIN:  PAIN:  Are you having pain? Yes NPRS  scale:  4/10 upper traps, mid back; shoulders Pain location: chest, low shoulder blades Aggravating factors: stress; overstretching Relieving factors: DN   PRECAUTIONS: None  RED FLAGS: None     WEIGHT BEARING RESTRICTIONS: No  FALLS:  Has patient fallen in last 6 months? No  LIVING ENVIRONMENT: Lives with: lives with their family and lives with their spouse Lives in: House/apartment   OCCUPATION: Pharmacist  PLOF: Independent, Independent with basic ADLs, Independent with household mobility without device, Independent with community mobility without device, Independent with homemaking with ambulation, Independent with gait, and Independent with transfers  PATIENT GOALS: to eliminate pain and be able to work and do my usual daily activities and sleep without interruption The Patient-Specific Functional Scale  Initial:  I am going to ask you to identify up to 3 important activities that you are unable to do or are having difficulty with as a result of this problem.  Today are there any activities that you are unable to do or having difficulty with because of this?  (Patient shown scale and patient rated each activity)  Follow up: When you first came in you had difficulty performing these activities.  Today do you still have difficulty?  Patient-Specific activity scoring scheme (Point to one number):  0 1 2 3 4 5 6 7 8 9  10 Unable                                                                                                          Able to perform To perform  activity at the same Activity         Level as before                                                                                                                       Injury or problem  Activity       Stamina to work all day                                                                       10/15     5              1/14:    10   2.             Driving the kids to school and picking up                                    10/15      6           1/14:      7-8    3.            Doing extended housework                                                            10/15      4            1/14:     7   4. Able to push the cart at [employer]                                                                                               1/14:     7   NEXT MD VISIT: 04/10/23  OBJECTIVE:   DIAGNOSTIC FINDINGS:  MRI showed small lesion 4 mm breast and had biopsy in July; normal but recommended another MRI and biopsy   PATIENT SURVEYS:  NDI 80% self disability score  NDI 10/15:  54%  NDI 11/26:   48%   NDI 1/14:    36%  COGNITION: Overall cognitive status: Within functional limits for tasks assessed  SENSATION: WFL  POSTURE: rounded shoulders  PALPATION: Tight bands and trigger points in R > L upper traps and rhomboids   CERVICAL ROM:   Active ROM A/PROM (deg) eval 10/15 11/26 1/14  Flexion WNL WNL WNL   Extension WNL WNL    Right lateral flexion WNL WNL  20% limited  Left lateral flexion WNL WNL  20% limited  Right rotation WNL WNL  10% limited  Left rotation WNL WNL     (Blank rows = not tested)   UPPER EXTREMITY MMT: 5/5 throughout bilateral upper extremities but patient states that she "is going to be so sore" from the muscle testing throughout the test.   TODAY'S TREATMENT:   09/20/2023 UBE change direction 1/2 way:  5 min while discussing status  Discussion of current exercise program Push ups on the counter wide hand placement 12x Lat bar 35# 12x Cable row 1# 12x (verbal cues for slight knee bend and abdominal draw in) yellow loop wall slides with lift off at the top 10x Therapeutic exercise: pulling, pushing, reaching Manual therapy to bil upper traps, periscapular musculature Trigger Point Dry-Needling  Treatment instructions: Expect mild to moderate muscle soreness. S/S of  pneumothorax if dry needled over a lung field, and to seek immediate medical attention should they occur. Patient verbalized understanding of these instructions and education.  Patient Consent Given: Yes Education handout provided: Previously provided Muscles treated:  bil upper traps, bil levator scap, bil teres, bil infraspinatus Electrical stimulation performed: No Parameters: N/A Treatment response/outcome: dec tender point size/number, twitch response and decreased muscle tension  08/20/2023 UBE change direction 1/2 way:  5 min while discussing status  Discussion of current exercise program Discussion of progress toward goals PSFS NDI Manual therapy to bil upper traps, periscapular musculature Trigger Point Dry-Needling  Treatment instructions: Expect mild to moderate muscle soreness. S/S of pneumothorax if dry needled over a lung field, and to seek immediate medical attention should they occur. Patient verbalized understanding of these instructions and education.  Patient Consent Given: Yes Education handout provided: Previously provided Muscles treated:  bil pecs; bil upper traps Electrical stimulation performed: No Parameters: N/A Treatment response/outcome: dec tender point size/number, twitch response and decreased muscle tension  07/26/23 Nu-Step L5 ( green machine) 5 min Push ups on the railing 10x Lat bar 30# 10x Cable row 12# 10x (verbal cues for slight knee bend and abdominal draw in) Manual therapy to bil upper traps, periscapular musculature Trigger Point Dry-Needling  Treatment instructions: Expect mild to moderate muscle soreness. S/S of pneumothorax if dry needled over a lung field, and to seek immediate medical attention should they occur. Patient verbalized understanding of these instructions and education.  Patient Consent Given: Yes Education handout provided: Previously provided Muscles treated:  bil teres; bil upper traps;  bil levator scap; bil  subscapularis, bil posterior deltoid Electrical stimulation performed: No Parameters: N/A Treatment response/outcome: dec tender point size/number, twitch response and decreased muscle tension  Heat following DN after session 12/10 Nu-Step L5 ( green machine) 5 min Discussion of exercise and for general health and well being (longevity of life, decreased hospitalizations, dec chronic disease etc) it's like "health insurance" benefit of a trainer for accountability  Discussed and given notes about starting with low reps 10 and under, low volume at first and key machines: lat bar, leg press, row, triceps Lat bar 30# 10x Cable row 10# 10x Blue loop wall slides with lift  off at the top 10x Manual therapy to bil upper traps, periscapular musculature Trigger Point Dry-Needling  Treatment instructions: Expect mild to moderate muscle soreness. S/S of pneumothorax if dry needled over a lung field, and to seek immediate medical attention should they occur. Patient verbalized understanding of these instructions and education.  Patient Consent Given: Yes Education handout provided: Previously provided Muscles treated:  bil teres; bil upper traps;  bil levator scap; bil subscapularis, bil posterior deltoid Electrical stimulation performed: No Parameters: N/A Treatment response/outcome: dec tender point size/number, twitch response and decreased muscle tension      PATIENT EDUCATION:  Education details:  HEP Person educated: Patient Education method: Programmer, multimedia, Facilities manager, Verbal cues, and Handouts Education comprehension: verbalized understanding, returned demonstration, and verbal cues required  HOME EXERCISE PROGRAM: Access Code: [email or web address] URL: [email or web address] Date: 07/12/2023 Prepared by: Lavinia Sharps  Exercises - Supine Chest Stretch on Foam Roll  - 1 x daily - 7 x weekly - 3 sets - 10 reps - Thoracic Extension Mobilization on Foam Roll  - 1 x daily - 7 x weekly -  3 sets - 10 reps - Supine Shoulder Horizontal Abduction with Resistance  - 1 x daily - 7 x weekly - 3 sets - 10 reps - Supine PNF D2 Flexion with Resistance  - 1 x daily - 7 x weekly - 3 sets - 10 reps - Latissimus Dorsi Stretch at Wall  - 2 x daily - 7 x weekly - 1 sets - 2-3 reps - 20 hold - Prone Chest Stretch on Chair  - 1 x daily - 7 x weekly - 1 sets - 2-3 reps - 20 hold - Standing Thoracic Open Book at Wall  - 1 x daily - 7 x weekly - 1 sets - 5 reps - Standing Cervical Retraction  - 3-4 x daily - 7 x weekly - 1 sets - 5 reps - Wall Push Up  - 1 x daily - 7 x weekly - 1 sets - 10 reps - Wall Angels  - 1 x daily - 7 x weekly - 1 sets - 10 reps - Shoulder Overhead Press in Flexion with Dumbbells  - 1 x daily - 7 x weekly - 2 sets - 5 reps - Squat with Chair Touch  - 1 x daily - 7 x weekly - 2 sets - 5 reps - Half Deadlift with Kettlebell  - 1 x daily - 7 x weekly - 2 sets - 5 reps - Farmer's Carry with Kettlebells  - 1 x daily - 7 x weekly - 1 sets - 10 reps - Standing Hip Circles  - 1 x daily - 7 x weekly - 1 sets - 10 reps - Seated Assisted Cervical Rotation with Towel  - 1 x daily - 7 x weekly - 1 sets - 10 reps - Cervical Extension AROM with Strap  - 1 x daily - 7 x weekly - 1 sets - 10 reps - Supine Diaphragmatic Breathing  - 1 x daily - 7 x weekly - 1 sets - 10 reps - Doorway Pec Stretch at 90 Degrees Abduction  - 1 x daily - 7 x weekly - 2 sets - 2 reps - 20 hold - Doorway Pec Stretch at 120 Degrees Abduction  - 1 x daily - 7 x weekly - 2 sets - 2 reps - 20 hold - Standing Quadratus Lumborum Stretch with Doorway  - 1 x daily - 7 x weekly - 2 sets - 2 reps -  20 hold - Standing Lat Pull Down with Resistance - Elbows Bent  - 1 x daily - 7 x weekly - 1 sets - 10 reps - Shoulder extension with resistance - Neutral  - 1 x daily - 7 x weekly - 1 sets - 10 reps - Standing Plank on Wall  - 1 x daily - 7 x weekly - 1 sets - 5 reps - 5 hold - Standing Full Side Plank on Wall  - 1 x daily - 7  x weekly - 1 sets - 3 reps - 5 hold - Prone Hip Extension - One Pillow  - 1 x daily - 7 x weekly - 1 sets - 5 reps - Prone Shoulder Extension  - 1 x daily - 7 x weekly - 1 sets - 5 reps - Prone Scapular Retraction Arms at Side  - 1 x daily - 7 x weekly - 1 sets - 5 reps -ASSESSMENT:  CLINICAL IMPRESSION:   Devota has implemented several positive exercise strategies.  Today she demonstrates good technique with minimal cues for each including coordinated breathing and to encourage activation of targeted muscle groups without compensation.  The patient had numerous muscle twitches produced during dry needling which is a good prognostic indicator for benefit as it is associated with positive neuromotor and nervous system changes.     OBJECTIVE IMPAIRMENTS: increased fascial restrictions and increased muscle spasms.   ACTIVITY LIMITATIONS: carrying, lifting, bending, sitting, squatting, sleeping, transfers, bed mobility, bathing, dressing, and hygiene/grooming  PARTICIPATION LIMITATIONS: meal prep, cleaning, laundry, interpersonal relationship, driving, shopping, community activity, and occupation  PERSONAL FACTORS: Education, Past/current experiences, and 3+ comorbidities: anxiety , depression and chronic pain  are also affecting patient's functional outcome.   REHAB POTENTIAL: Fair    CLINICAL DECISION MAKING: Unstable/unpredictable  EVALUATION COMPLEXITY: High   GOALS: Goals reviewed with patient? Yes  SHORT TERM GOALS: Target date: 04/24/2023   Patient to report pain no greater than 2/10  Baseline:  Goal status: ongoing  2.  Patient will be independent with initial HEP  Baseline:  Goal status: met 9/17    LONG TERM GOALS: Target date:3/  11  /25   Patient to report an overall improvement at 60% with home and work ADLs Baseline:  Goal status: revised  2.  Patient to be independent with advanced HEP  Baseline:  Goal status:ongoing  3.  Patient to be able to sleep  through the night  Baseline:  Goal status: ongoing  4.  Patient to be able to return to work Baseline:  Goal status: met 10/15  5.  Neck pain disablity score to improve by 5 Baseline:  Goal status: met 10/15  6.  Patient to report PSFS score of 6 with doing housework for extended period of time Baseline:  Goal status: met 1/14  7.  Patient will report PSFS of 8 with driving kids to and from school ongoing  8. PSFS of 6 with stamina to work a long shift Met 1/14   PLAN:  PT FREQUENCY: every other week  PT DURATION: 8 weeks  PLANNED INTERVENTIONS: Therapeutic exercises, Therapeutic activity, Neuromuscular re-education, Balance training, Gait training, Patient/Family education, Self Care, Joint mobilization, Vestibular training, Canalith repositioning, Aquatic Therapy, Dry Needling, Electrical stimulation, Spinal mobilization, Cryotherapy, Moist heat, Taping, Vasopneumatic device, Traction, Ultrasound, Ionotophoresis 4mg /ml Dexamethasone, and Manual therapy  PLAN FOR NEXT SESSION:  DN;  Nu-step, progress postural strengthening; resistance training upper quarter; taper visits and plan for discharge in 2-3 visits  Rubin Dais  Wilena Tyndall, PT 09/20/23 10:35 AM Phone: [phone] Fax: [phone]

## 2023-09-27 ENCOUNTER — Other Ambulatory Visit: Payer: Self-pay | Admitting: General Surgery

## 2023-09-27 DIAGNOSIS — Z1502 Genetic susceptibility to malignant neoplasm of ovary: Secondary | ICD-10-CM

## 2023-09-27 DIAGNOSIS — N6314 Unspecified lump in the right breast, lower inner quadrant: Secondary | ICD-10-CM

## 2023-09-27 DIAGNOSIS — Z1589 Genetic susceptibility to other disease: Secondary | ICD-10-CM

## 2023-09-27 DIAGNOSIS — Z1501 Genetic susceptibility to malignant neoplasm of breast: Secondary | ICD-10-CM

## 2023-09-27 DIAGNOSIS — Z1509 Genetic susceptibility to other malignant neoplasm: Secondary | ICD-10-CM

## 2023-10-08 ENCOUNTER — Encounter: Payer: Managed Care, Other (non HMO) | Admitting: Physical Therapy

## 2023-10-15 ENCOUNTER — Encounter: Payer: Managed Care, Other (non HMO) | Admitting: Physical Therapy

## 2023-10-22 ENCOUNTER — Encounter: Payer: Managed Care, Other (non HMO) | Admitting: Physical Therapy

## 2023-10-28 ENCOUNTER — Encounter: Payer: Managed Care, Other (non HMO) | Admitting: Physical Therapy

## 2023-10-30 ENCOUNTER — Ambulatory Visit: Admitting: Physical Therapy

## 2023-10-31 ENCOUNTER — Encounter: Payer: Self-pay | Admitting: Physical Therapy

## 2023-10-31 ENCOUNTER — Ambulatory Visit: Attending: Obstetrics and Gynecology | Admitting: Physical Therapy

## 2023-10-31 DIAGNOSIS — M62838 Other muscle spasm: Secondary | ICD-10-CM | POA: Diagnosis present

## 2023-10-31 DIAGNOSIS — R252 Cramp and spasm: Secondary | ICD-10-CM | POA: Diagnosis present

## 2023-10-31 DIAGNOSIS — R279 Unspecified lack of coordination: Secondary | ICD-10-CM | POA: Diagnosis present

## 2023-10-31 DIAGNOSIS — M546 Pain in thoracic spine: Secondary | ICD-10-CM | POA: Insufficient documentation

## 2023-10-31 DIAGNOSIS — M542 Cervicalgia: Secondary | ICD-10-CM | POA: Diagnosis present

## 2023-10-31 DIAGNOSIS — M6281 Muscle weakness (generalized): Secondary | ICD-10-CM | POA: Insufficient documentation

## 2023-10-31 NOTE — Therapy (Signed)
 OUTPATIENT PHYSICAL THERAPY FEMALE PELVIC TREATMENT/ reeval   Patient Name: Adrienne Williams MRN: [id number] DOB:Aug 24, 1980, 43 y.o., female Today's Date: 10/31/2023  END OF SESSION:  PT End of Session - 10/31/23 1541     Visit Number 28    Date for PT Re-Evaluation 01/23/24    Authorization Type [employer]    PT Start Time 1535    PT Stop Time 1615    PT Time Calculation (min) 40 min    Activity Tolerance Patient tolerated treatment well    Behavior During Therapy [facility] for tasks assessed/performed                    Past Medical History:  Diagnosis Date   Anxiety    Asthma    Depression    Family history of breast cancer    Frank breech presentation 05/29/2018   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   Hemorrhoids    Hx of varicella    PONV (postoperative nausea and vomiting)    "little nauseated"   Postpartum care following cesarean delivery (6/14) 01/18/2016   Past Surgical History:  Procedure Laterality Date   BREAST BIOPSY Right 02/25/2023   CESAREAN SECTION N/A 01/18/2016   Procedure: Primary CESAREAN SECTION;  Surgeon: Genia Del, MD;  Location: [facility];  Service: Obstetrics;  Laterality: N/A;  EDD: 01/25/16   CESAREAN SECTION N/A 05/29/2018   Procedure: Repeat CESAREAN SECTION;  Surgeon: Shea Evans, MD;  Location: [facility] SU[facility];  Service: Obstetrics;  Laterality: N/A;  EDD: 06/10/18   COLONOSCOPY     CYSTECTOMY  2002   pilonidal region   DIAGNOSTIC LAPAROSCOPY WITH REMOVAL OF ECTOPIC PREGNANCY Right 05/29/2020   Procedure: DIAGNOSTIC LAPAROSCOPY WITH REMOVAL OF ECTOPIC PREGNANCY;  Surgeon: Shea Evans, MD;  Location: [facility] OR;  Service: Gynecology;  Laterality: Right;   MOLE REMOVAL     UNILATERAL SALPINGECTOMY Right 05/29/2020   Procedure: UNILATERAL SALPINGECTOMY;  Surgeon: Shea Evans, MD;  Location: [facility] OR;  Service: Gynecology;  Laterality: Right;   WISDOM TOOTH EXTRACTION     Patient Active Problem  List   Diagnosis Date Noted   Genetic testing 09/02/2023   Poor sleep 05/22/2023   Neck pain, chronic 06/27/2022   Fibromyalgia 06/27/2022   Myofascial pain 06/27/2022   Internal hemorrhoids 06/13/2022   Rectal bleeding 06/13/2022   Family history of breast cancer 01/18/2022   Monoallelic mutation of CHEK2 gene in female patient 01/18/2022   Anxiety and depression 05/31/2018   Rh negative, maternal 05/31/2018   Homero Fellers breech presentation 05/29/2018   Status post repeat low transverse cesarean section 05/29/2018   Postpartum care following cesarean delivery (10/24) 05/29/2018    PCP: Jarrett Soho, PA-C PCP - General  REFERRING PROVIDER: Vick Frees, MD Ref Provider  REFERRING DIAG: N39.3 (ICD-10-CM) - Stress incontinence (female) (female)  THERAPY DIAG:  Muscle weakness (generalized)  Other muscle spasm  Pain in thoracic spine  Unspecified lack of coordination  Cramp and spasm  Cervicalgia  Rationale for Evaluation and Treatment: Rehabilitation  ONSET DATE: 2019  SUBJECTIVE:   Pt coming back to PT after 6 weeks.  Pt reports that leakage has been way worse, she has been running more, high intensity training,  Wants to focus on SUI Has abdominal pain d/t C section scar- irritated by sitting Pt sitting more, currently on FMLA, doing outpatient mental health therapy for PTSD. Sitting more. States that sitting has been aggravating her abdomen.  Not using lotion with cupping.                                                                                                                             SUBJECTIVE STATEMENT:  Fluid intake: Yes: mostly water, seltzer, liquid IV    PAIN:  Are you having pain? Yes NPRS scale: 3/10 Pain location: External  Pain type: throbbing, tight, and tingling Pain description: constant, stabbing if super tight  Aggravating factors: walking or running, being on her feet, sitting, standing Relieving factors:  diaphragmatic breathing, lidocaine patches PRECAUTIONS: None  RED FLAGS: None   WEIGHT BEARING RESTRICTIONS: No  FALLS:  Has patient fallen in last 6 months? No  LIVING ENVIRONMENT: Lives with: lives with their family Lives in: House/apartment Stairs: No Has following equipment at home: None  OCCUPATION: pharmacist- retail, very difficult right now  PLOF: Independent  PATIENT GOALS: to be able to relieve pain without having to spend a ton of time on it, has 2 young kids and a job. Pain is a priority. Rare that the leaking happens.   PERTINENT HISTORY:   Sexual abuse: No  BOWEL MOVEMENT: Pain with bowel movement: Yes when stuff is super tight Type of bowel movement:Strain Yes sometimes Fully empty rectum: Yes: when she goes Leakage: No only when the hemorrhoid is really bad, it was crazy, it' better now Pads: No Fiber supplement: No  URINATION: Pain with urination: No Fully empty bladder: No sometimes feels like she struggles Stream: Weak Urgency: No, used to Frequency: feels like she constantly pees  Leakage: Coughing, Sneezing, and Exercise, running Pads: Yes: and period underwear  INTERCOURSE: Pain with intercourse: no pain Ability to have vaginal penetration:  Yes: has not in a while Climax: yes Marinoff Scale: 2/3  PREGNANCY: Vaginal deliveries 0 Tearing No C-section deliveries 2 Currently pregnant No  PROLAPSE: Check with internal   OBJECTIVE:  Note: Objective measures were completed at Evaluation unless otherwise noted.   COGNITION: Overall cognitive status: Within functional limits for tasks assessed     SENSATION: Light touch: Appears intact Proprioception: Appears intact  MUSCLE LENGTH: Hamstrings: tight bilateral Thomas test: Right tight ; Left tight    POSTURE: rounded shoulders and forward head  PELVIC ALIGNMENT: even  LUMBARAROM/PROM:  A/PROM A/PROM  eval  Flexion limited  Extension limited  Right lateral flexion  limited  Left lateral flexion   Right rotation   Left rotation    (Blank rows = not tested)  LOWER EXTREMITY ROM:  Passive ROM Right eval Left eval  Hip flexion limited limited  Hip extension    Hip abduction limited limited  Hip adduction    Hip internal rotation    Hip external rotation    Knee flexion    Knee extension    Ankle dorsiflexion    Ankle plantarflexion    Ankle inversion    Ankle eversion     (Blank rows = not tested)  LOWER EXTREMITY  MMT: 4/5 grossly overall  PALPATION:   General  upper chest breathing and restrictions throughout abdomen,  Trigger points throughout glutes and abdomen- more on right,  Tight umbilical scars                External Perineal Exam wfl                             Internal Pelvic Floor decreased strength, high tone, tight and tender throughout Patient confirms identification and approves PT to assess internal pelvic floor and treatment Yes  PELVIC MMT:   MMT eval  Vaginal 2/5  Internal Anal Sphincter 5/5  External Anal Sphincter 5/5  Puborectalis   Diastasis Recti Yes- 1 finger above umbilicus  (Blank rows = not tested)        TONE: high  PROLAPSE: no TODAY'S TREATMENT:                                                                                                                              DATE: 10/31/23      Manual- dry needling and cupping abdominal scar There acts- pessary education, review of progress, HEP     Treatment instructions: First Select Initial or Subsequent and then (Multi-select), will start with all checked Initial Treatment: Pt instructed on dry needling rationale, procedures, and possible side effects. Pt instructed to expect mild to moderate muscle soreness later today and/or tomorrow. Pt instructed in methods to reduce muscle soreness. Pt instructed to continue prescribed HEP. Because dry needling was performed over or adjacent to a lung field, pt was educated on S/S of pneumothorax  and to seek immediate medical attention should they occur.  Patient was educated on signs and symptoms of infection and other risk factors and advised to seek medical attention should they occur. Patient verbalized understanding of these instructions and education. Subsequent Treatment Instructions provided previously at initial dry needling treatment Instructions reviewed, if requested by the patient, prior to subsequent dry needling treatment  Patient Verbal Consent Given: Yes  Education handout provided: Yes Muscles treated: see above Electrical stimulation performed: No  Parameters: N/A Treatment response/outcome: less pain, spasm and increased ROM   PATIENT EDUCATION:  Education details/ manual performed: scar massage and cupping Person educated: Patient Education method: Programmer, multimedia, Demonstration, and Handouts Education comprehension: verbalized understanding, returned demonstration, verbal cues required, tactile cues required, and needs further education    HOME EXERCISE PROGRAM:  Access Code: [email or web address] URL: [email or web address] Date: 10/31/2023 Prepared by: Darrell Jewel Gesenia Bantz  Exercises - Abdominal Press into [place]  - 1 x daily - 7 x weekly - 3 sets - 10 reps - Supine Bridge with Pelvic Floor Contraction  - 1 x daily - 7 x weekly - 3 sets - 10 reps - Quadruped Pelvic Floor Contraction with Opposite Arm and Leg Lift  - 1 x daily - 7 x weekly - 3 sets - 10 reps -  Single Leg Squat with Counter Support  - 1 x daily - 7 x weekly - 3 sets - 10 reps - Side Stepping with Resistance at Ankles  - 1 x daily - 7 x weekly - 3 sets - 10 reps ASSESSMENT:  CLINICAL IMPRESSION: Pt with tightness and pain C section scar and abdominals. Did well with dry needling and cupping. Reeval completed. Pt has been more active, riding horses, swimming and working out. Has had more increased SUI returning to PT to focus on that.    OBJECTIVE IMPAIRMENTS: decreased activity tolerance,  decreased coordination, decreased endurance, decreased mobility, decreased ROM, decreased strength, increased fascial restrictions, increased muscle spasms, impaired flexibility, and pain.   ACTIVITY LIMITATIONS: bending  PARTICIPATION LIMITATIONS: interpersonal relationship, occupation, and yard work  PERSONAL FACTORS: Time since onset of injury/illness/exacerbation are also affecting patient's functional outcome.   REHAB POTENTIAL: Good  CLINICAL DECISION MAKING: Stable/uncomplicated  EVALUATION COMPLEXITY: Low   GOALS: updated 07/22/2023 Goals reviewed with patient? Yes  SHORT TERM GOALS: Target date: 06/10/2023 updated 09/16/2023 updated 11/28/2023      Pt will be independent with HEP.   Baseline: not  Goal status: met  2.  Pt will be able to run/workout for 30 minutes without leakage or discomfort  Baseline: leaking   Goal status: leakage is worse because she is not as tense  3.  Pt will be independent with diaphragmatic breathing and down training activities in order to improve pelvic floor relaxation.  Baseline: no Goal status: progressing  LONG TERM GOALS: Target date: 08/05/2023 - updated 09/16/2023 updated on 10/31/2023 for 01/23/2024        Pt will be independent with advanced HEP.   Baseline: not yet Goal status: progressing  2.  Pt will report decreased pelvic pain to max 3/10  Baseline:  Goal status: INITIAL  3.  Pt will report reduced pain abdominal scars to max 3/10 Baseline:  Goal status: improving  4.  Pt will soak 0 pads/ day Baseline:  Goal status: progressing   PLAN:  PT FREQUENCY: 1-2x/week  PT DURATION: 8 weeks  PLANNED INTERVENTIONS: Therapeutic exercises, Therapeutic activity, Neuromuscular re-education, Balance training, Gait training, Patient/Family education, Self Care, Joint mobilization, Dry Needling, Electrical stimulation, Spinal manipulation, Spinal mobilization, scar mobilization, Biofeedback, and Manual therapy  PLAN FOR  NEXT SESSION: core strengthening for SUI Tatumn Corbridge, PT 10/31/23 3:43 PM

## 2023-11-04 ENCOUNTER — Other Ambulatory Visit: Payer: Self-pay | Admitting: Physical Medicine and Rehabilitation

## 2023-11-04 ENCOUNTER — Encounter: Payer: Managed Care, Other (non HMO) | Admitting: Physical Therapy

## 2023-11-06 ENCOUNTER — Ambulatory Visit: Payer: Managed Care, Other (non HMO) | Admitting: Physical Medicine and Rehabilitation

## 2023-11-07 ENCOUNTER — Ambulatory Visit: Attending: Obstetrics and Gynecology | Admitting: Physical Therapy

## 2023-11-07 ENCOUNTER — Encounter: Payer: Self-pay | Admitting: Physical Therapy

## 2023-11-07 DIAGNOSIS — M542 Cervicalgia: Secondary | ICD-10-CM | POA: Insufficient documentation

## 2023-11-07 DIAGNOSIS — M62838 Other muscle spasm: Secondary | ICD-10-CM | POA: Insufficient documentation

## 2023-11-07 DIAGNOSIS — M6281 Muscle weakness (generalized): Secondary | ICD-10-CM | POA: Insufficient documentation

## 2023-11-07 DIAGNOSIS — M546 Pain in thoracic spine: Secondary | ICD-10-CM | POA: Insufficient documentation

## 2023-11-07 DIAGNOSIS — R279 Unspecified lack of coordination: Secondary | ICD-10-CM | POA: Insufficient documentation

## 2023-11-07 NOTE — Therapy (Signed)
 OUTPATIENT PHYSICAL THERAPY FEMALE PELVIC TREATMENT/ reeval   Patient Name: Adrienne Williams MRN: [id number] DOB:1980-10-07, 43 y.o., female Today's Date: 11/07/2023  END OF SESSION:  PT End of Session - 11/07/23 1450     Visit Number 29    Date for PT Re-Evaluation 01/23/24    Authorization Type [employer]    PT Start Time 1449    PT Stop Time 1530    PT Time Calculation (min) 41 min    Activity Tolerance Patient tolerated treatment well    Behavior During Therapy [facility] for tasks assessed/performed                     Past Medical History:  Diagnosis Date   Anxiety    Asthma    Depression    Family history of breast cancer    Frank breech presentation 05/29/2018   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   Hemorrhoids    Hx of varicella    PONV (postoperative nausea and vomiting)    "little nauseated"   Postpartum care following cesarean delivery (6/14) 01/18/2016   Past Surgical History:  Procedure Laterality Date   BREAST BIOPSY Right 02/25/2023   CESAREAN SECTION N/A 01/18/2016   Procedure: Primary CESAREAN SECTION;  Surgeon: Genia Del, MD;  Location: [facility];  Service: Obstetrics;  Laterality: N/A;  EDD: 01/25/16   CESAREAN SECTION N/A 05/29/2018   Procedure: Repeat CESAREAN SECTION;  Surgeon: Shea Evans, MD;  Location: [facility] BIRTHING SUIT[facility];  Service: Obstetrics;  Laterality: N/A;  EDD: 06/10/18   COLONOSCOPY     CYSTECTOMY  2002   pilonidal region   DIAGNOSTIC LAPAROSCOPY WITH REMOVAL OF ECTOPIC PREGNANCY Right 05/29/2020   Procedure: DIAGNOSTIC LAPAROSCOPY WITH REMOVAL OF ECTOPIC PREGNANCY;  Surgeon: Shea Evans, MD;  Location: [facility] OR;  Service: Gynecology;  Laterality: Right;   MOLE REMOVAL     UNILATERAL SALPINGECTOMY Right 05/29/2020   Procedure: UNILATERAL SALPINGECTOMY;  Surgeon: Shea Evans, MD;  Location: [facility] OR;  Service: Gynecology;  Laterality: Right;   WISDOM TOOTH EXTRACTION     Patient Active Problem  List   Diagnosis Date Noted   Genetic testing 09/02/2023   Poor sleep 05/22/2023   Neck pain, chronic 06/27/2022   Fibromyalgia 06/27/2022   Myofascial pain 06/27/2022   Internal hemorrhoids 06/13/2022   Rectal bleeding 06/13/2022   Family history of breast cancer 01/18/2022   Monoallelic mutation of CHEK2 gene in female patient 01/18/2022   Anxiety and depression 05/31/2018   Rh negative, maternal 05/31/2018   Homero Fellers breech presentation 05/29/2018   Status post repeat low transverse cesarean section 05/29/2018   Postpartum care following cesarean delivery (10/24) 05/29/2018    PCP: Jarrett Soho, PA-C PCP - General  REFERRING PROVIDER: Vick Frees, MD Ref Provider  REFERRING DIAG: N39.3 (ICD-10-CM) - Stress incontinence (female) (female)  THERAPY DIAG:  Muscle weakness (generalized)  Unspecified lack of coordination  Other muscle spasm  Pain in thoracic spine  Cervicalgia  Rationale for Evaluation and Treatment: Rehabilitation  ONSET DATE: 2019  SUBJECTIVE:   Pt reports that he C section scar is bothering her She got up for this from her nap She has not been running Has been going to the bathroom a lot, hydrating a lot Had asthma attack today Pt reports that abdomen felt good after cupping after last visit. She reports consistency with HEP  SUBJECTIVE STATEMENT:  Fluid intake: Yes: mostly water, seltzer, liquid IV    PAIN:  Are you having pain? Yes NPRS scale: 3/10 Pain location: External  Pain type: throbbing, tight, and tingling Pain description: constant, stabbing if super tight  Aggravating factors: walking or running, being on her feet, sitting, standing Relieving factors: diaphragmatic breathing, lidocaine patches PRECAUTIONS: None  RED FLAGS: None   WEIGHT BEARING RESTRICTIONS: No  FALLS:  Has patient fallen in last 6  months? No  LIVING ENVIRONMENT: Lives with: lives with their family Lives in: House/apartment Stairs: No Has following equipment at home: None  OCCUPATION: pharmacist- retail, very difficult right now  PLOF: Independent  PATIENT GOALS: to be able to relieve pain without having to spend a ton of time on it, has 2 young kids and a job. Pain is a priority. Rare that the leaking happens.   PERTINENT HISTORY:   Sexual abuse: No  BOWEL MOVEMENT: Pain with bowel movement: Yes when stuff is super tight Type of bowel movement:Strain Yes sometimes Fully empty rectum: Yes: when she goes Leakage: No only when the hemorrhoid is really bad, it was crazy, it' better now Pads: No Fiber supplement: No  URINATION: Pain with urination: No Fully empty bladder: No sometimes feels like she struggles Stream: Weak Urgency: No, used to Frequency: feels like she constantly pees  Leakage: Coughing, Sneezing, and Exercise, running Pads: Yes: and period underwear  INTERCOURSE: Pain with intercourse: no pain Ability to have vaginal penetration:  Yes: has not in a while Climax: yes Marinoff Scale: 2/3  PREGNANCY: Vaginal deliveries 0 Tearing No C-section deliveries 2 Currently pregnant No  PROLAPSE: Check with internal   OBJECTIVE:  Note: Objective measures were completed at Evaluation unless otherwise noted.   COGNITION: Overall cognitive status: Within functional limits for tasks assessed     SENSATION: Light touch: Appears intact Proprioception: Appears intact  MUSCLE LENGTH: Hamstrings: tight bilateral Thomas test: Right tight ; Left tight    POSTURE: rounded shoulders and forward head  PELVIC ALIGNMENT: even  LUMBARAROM/PROM:  A/PROM A/PROM  eval  Flexion limited  Extension limited  Right lateral flexion limited  Left lateral flexion   Right rotation   Left rotation    (Blank rows = not tested)  LOWER EXTREMITY ROM:  Passive ROM Right eval Left eval  Hip  flexion limited limited  Hip extension    Hip abduction limited limited  Hip adduction    Hip internal rotation    Hip external rotation    Knee flexion    Knee extension    Ankle dorsiflexion    Ankle plantarflexion    Ankle inversion    Ankle eversion     (Blank rows = not tested)  LOWER EXTREMITY MMT: 4/5 grossly overall  PALPATION:   General  upper chest breathing and restrictions throughout abdomen,  Trigger points throughout glutes and abdomen- more on right,  Tight umbilical scars                External Perineal Exam wfl                             Internal Pelvic Floor decreased strength, high tone, tight and tender throughout Patient confirms identification and approves PT to assess internal pelvic floor and treatment Yes  PELVIC MMT:   MMT eval  Vaginal 2/5  Internal Anal Sphincter 5/5  External Anal Sphincter 5/5  Puborectalis   Diastasis Recti  Yes- 1 finger above umbilicus  (Blank rows = not tested)        TONE: high  PROLAPSE: no TODAY'S TREATMENT:                                                                                                                              DATE: 11/07/23     There ex- side stepping with black theraband 5 laps Manual- dry needling and cupping abdominal scar    Treatment instructions: First Select Initial or Subsequent and then (Multi-select), will start with all checked Initial Treatment: Pt instructed on dry needling rationale, procedures, and possible side effects. Pt instructed to expect mild to moderate muscle soreness later today and/or tomorrow. Pt instructed in methods to reduce muscle soreness. Pt instructed to continue prescribed HEP. Because dry needling was performed over or adjacent to a lung field, pt was educated on S/S of pneumothorax and to seek immediate medical attention should they occur.  Patient was educated on signs and symptoms of infection and other risk factors and advised to seek medical  attention should they occur. Patient verbalized understanding of these instructions and education. Subsequent Treatment Instructions provided previously at initial dry needling treatment Instructions reviewed, if requested by the patient, prior to subsequent dry needling treatment  Patient Verbal Consent Given: Yes  Education handout provided: Yes Muscles treated: see above Electrical stimulation performed: No  Parameters: N/A Treatment response/outcome: less pain, spasm and increased ROM   PATIENT EDUCATION:  Education details/ manual performed: scar massage and cupping Person educated: Patient Education method: Programmer, multimedia, Demonstration, and Handouts Education comprehension: verbalized understanding, returned demonstration, verbal cues required, tactile cues required, and needs further education    HOME EXERCISE PROGRAM:  Access Code: [email or web address] URL: [email or web address] Date: 10/31/2023 Prepared by: Darrell Jewel Daiton Cowles  Exercises - Abdominal Press into [place]  - 1 x daily - 7 x weekly - 3 sets - 10 reps - Supine Bridge with Pelvic Floor Contraction  - 1 x daily - 7 x weekly - 3 sets - 10 reps - Quadruped Pelvic Floor Contraction with Opposite Arm and Leg Lift  - 1 x daily - 7 x weekly - 3 sets - 10 reps - Single Leg Squat with Counter Support  - 1 x daily - 7 x weekly - 3 sets - 10 reps - Side Stepping with Resistance at Ankles  - 1 x daily - 7 x weekly - 3 sets - 10 reps ASSESSMENT:  CLINICAL IMPRESSION: Pt with tightness and pain C section scar and abdominals. Did well with dry needling and cupping and exercises.   OBJECTIVE IMPAIRMENTS: decreased activity tolerance, decreased coordination, decreased endurance, decreased mobility, decreased ROM, decreased strength, increased fascial restrictions, increased muscle spasms, impaired flexibility, and pain.   ACTIVITY LIMITATIONS: bending  PARTICIPATION LIMITATIONS: interpersonal relationship, occupation, and yard  work  PERSONAL FACTORS: Time since onset of injury/illness/exacerbation are also affecting patient's functional outcome.   REHAB POTENTIAL: Good  CLINICAL DECISION  MAKING: Stable/uncomplicated  EVALUATION COMPLEXITY: Low   GOALS: updated 07/22/2023 Goals reviewed with patient? Yes  SHORT TERM GOALS: Target date: 06/10/2023 updated 09/16/2023 updated 11/28/2023      Pt will be independent with HEP.   Baseline: not  Goal status: met  2.  Pt will be able to run/workout for 30 minutes without leakage or discomfort  Baseline: leaking   Goal status: leakage is worse because she is not as tense  3.  Pt will be independent with diaphragmatic breathing and down training activities in order to improve pelvic floor relaxation.  Baseline: no Goal status: progressing  LONG TERM GOALS: Target date: 08/05/2023 - updated 09/16/2023 updated on 10/31/2023 for 01/23/2024        Pt will be independent with advanced HEP.   Baseline: not yet Goal status: progressing  2.  Pt will report decreased pelvic pain to max 3/10  Baseline:  Goal status: INITIAL  3.  Pt will report reduced pain abdominal scars to max 3/10 Baseline:  Goal status: improving  4.  Pt will soak 0 pads/ day Baseline:  Goal status: progressing   PLAN:  PT FREQUENCY: 1-2x/week  PT DURATION: 8 weeks  PLANNED INTERVENTIONS: Therapeutic exercises, Therapeutic activity, Neuromuscular re-education, Balance training, Gait training, Patient/Family education, Self Care, Joint mobilization, Dry Needling, Electrical stimulation, Spinal manipulation, Spinal mobilization, scar mobilization, Biofeedback, and Manual therapy  PLAN FOR NEXT SESSION: core strengthening for SUI Ligia Duguay, PT 11/07/23 3:28 PM

## 2023-11-11 ENCOUNTER — Ambulatory Visit: Payer: Managed Care, Other (non HMO) | Admitting: Gastroenterology

## 2023-11-11 ENCOUNTER — Encounter
Payer: Managed Care, Other (non HMO) | Attending: Physical Medicine and Rehabilitation | Admitting: Physical Medicine and Rehabilitation

## 2023-11-11 ENCOUNTER — Ambulatory Visit: Admitting: Physical Therapy

## 2023-11-11 VITALS — BP 101/70 | HR 92 | Ht 63.0 in | Wt 158.0 lb

## 2023-11-11 DIAGNOSIS — M797 Fibromyalgia: Secondary | ICD-10-CM | POA: Insufficient documentation

## 2023-11-11 DIAGNOSIS — F32A Depression, unspecified: Secondary | ICD-10-CM | POA: Insufficient documentation

## 2023-11-11 DIAGNOSIS — F419 Anxiety disorder, unspecified: Secondary | ICD-10-CM | POA: Diagnosis not present

## 2023-11-11 DIAGNOSIS — K5903 Drug induced constipation: Secondary | ICD-10-CM | POA: Diagnosis present

## 2023-11-11 MED ORDER — NALTREXONE HCL (PAIN) 4.5 MG PO CAPS: 4.5000 mg | ORAL_CAPSULE | Freq: Every day | ORAL | 2 refills | Status: AC

## 2023-11-11 MED ORDER — LIDOCAINE 5 % EX PTCH: 3.0000 | MEDICATED_PATCH | CUTANEOUS | 2 refills | Status: DC

## 2023-11-11 NOTE — Progress Notes (Signed)
 Subjective:    Patient ID: Adrienne Williams, female    DOB: [birth year], 43 y.o.   MRN: [id number]  HPI  Adrienne Williams is a 43 y.o. year old female  who  has a past medical history of Anxiety, Asthma, Depression, Family history of breast cancer, Frank breech presentation (05/29/2018), Gene mutation, Hemorrhoids, varicella, PONV (postoperative nausea and vomiting), and Postpartum care following cesarean delivery (6/14) (01/18/2016).   They are presenting to PM&R clinic for follow up related to fibromyalgia pain.  .  Plan from last visit:  Myofascial pain Neck pain, chronic Continue low dose naltrexone at current dose; send me a clinic message when you are starting to run out of medication   Uses gentle massage, myofascial release, and a heat pack on your abdomen during work breaks to help with abdominal pain.    Continue pelvic PT!    Fibromyalgia   Try to limit horseback riding to 1-2x weekly, along with other high-impact exercises. Swimming is great but please ensure you get 1-2 days a week of breaks to prevent exacerbating your pain.    Let me kow when your work accommodations need renewed.    Follow up with me in 4-5 months   Interval Hx:  - Therapies: She has been doing weight training and has been putting on "a ton" of weight; she has put on 20 lbs over the last few months. She is somewhat concerned about weight gain but feels great; "I am more functional than I have ever been."   She is taking private swim lessons for cardio - has just learned dolphin kicks and can swim up to 24 laps. She also started another discipline for horseback riding.   She's still doing pelvic PT; still feeling benefit but congested in her pelvis.    - Follow ups:    - Falls:none   - DME: none   - Medications:  Not much adjustment in her meds since starting inpatient psych. Is a little constipated. She is taking baclofen 10 mg at least once daily but less overall. Still taking LDN 4.5 mg,  doing "great". Needs a refill of lidocaine patches - she says these are hugely beneficial on her low back - upper shoulders are doing better.    - Other concerns: She is currently in intensive OP therapies the PTSD and dissociation; she feels she is doing a lot better now. She has 2 weeks left and "it's changing my life." Therapy has been hugely beneficial for her symptoms. [facility] is doing this at [facility].   Naps a lot on her days off and then goes to bed too late, but overall is sleeping better.   Pain Inventory Average Pain 2 Pain Right Now 2 My pain is  tight  In the last 24 hours, has pain interfered with the following? General activity 2 Relation with others 1 Enjoyment of life 2 What TIME of day is your pain at its worst? evening Sleep (in general) Fair  Pain is worse with: sitting, inactivity, unsure, and some activites Pain improves with: therapy/exercise Relief from Meds: 7  Family History  Problem Relation Age of Onset   ADD / ADHD Brother    Breast cancer Maternal Grandmother 15   Cancer Maternal Grandmother        stomach, possible met from breast cancer   Hypothyroidism Paternal Grandmother    Atrial fibrillation Paternal Grandmother    Angina Paternal Grandfather    Hypertension Paternal Grandfather  Hyperlipidemia Paternal Grandfather    Colon polyps Paternal Grandfather    Colon cancer Neg Hx    Esophageal cancer Neg Hx    Social History   Socioeconomic History   Marital status: Married    Spouse name: Not on file   Number of children: 2   Years of education: Not on file   Highest education level: Not on file  Occupational History   Occupation: Pharmacist  Tobacco Use   Smoking status: Never   Smokeless tobacco: Never  Vaping Use   Vaping status: Never Used  Substance and Sexual Activity   Alcohol use: No   Drug use: No   Sexual activity: Yes  Other Topics Concern   Not on file  Social History Narrative   Not on file   Social Drivers  of Health   Financial Resource Strain: Low Risk  (05/26/2018)   Overall Financial Resource Strain (CARDIA)    Difficulty of Paying Living Expenses: Not hard at all  Food Insecurity: Low Risk  (05/31/2023)   Received from [facility]   Hunger Vital Sign    Worried About Running Out of Food in the Last Year: Never true    Ran Out of Food in the Last Year: Never true  Transportation Needs: No Transportation Needs (05/31/2023)   Received from [facility]    In the past 12 months, has lack of reliable transportation kept you from medical appointments, meetings, work or from getting things needed for daily living? : No  Physical Activity: Not on file  Stress: Stress Concern Present (05/26/2018)   [employer] of Occupational Health - Occupational Stress Questionnaire    Feeling of Stress : To some extent  Social Connections: Not on file   Past Surgical History:  Procedure Laterality Date   BREAST BIOPSY Right 02/25/2023   CESAREAN SECTION N/A 01/18/2016   Procedure: Primary CESAREAN SECTION;  Surgeon: Marie-Lyne Lavoie, MD;  Location: [facility];  Service: Obstetrics;  Laterality: N/A;  EDD: 01/25/16   CESAREAN SECTION N/A 05/29/2018   Procedure: Repeat CESAREAN SECTION;  Surgeon: Terri Fester, MD;  Location: [facility] BIRTHING SUITES;  Service: Obstetrics;  Laterality: N/A;  EDD: 06/10/18   COLONOSCOPY     CYSTECTOMY  2002   pilonidal region   DIAGNOSTIC LAPAROSCOPY WITH REMOVAL OF ECTOPIC PREGNANCY Right 05/29/2020   Procedure: DIAGNOSTIC LAPAROSCOPY WITH REMOVAL OF ECTOPIC PREGNANCY;  Surgeon: Terri Fester, MD;  Location[facility] OR;  Service: Gynecology;  Laterality: Right;   MOLE REMOVAL     UNILATERAL SALPINGECTOMY Right 05/29/2020   Procedure: UNILATERAL SALPINGECTOMY;  Surgeon: Terri Fester, MD;  Location[facility];  Service: Gynecology;  Laterality: Right;   WISDOM TOOTH EXTRACTION     Past Surgical History:  Procedure Laterality Date   BREAST BIOPSY  Right 02/25/2023   CESAREAN SECTION N/A 01/18/2016   Procedure: Primary CESAREAN SECTION;  Surgeon: Percy Bracken, MD;  Location: [facility];  Service: Obstetrics;  Laterality: N/A;  EDD: 01/25/16   CESAREAN SECTION N/A 05/29/2018   Procedure: Repeat CESAREAN SECTION;  Surgeon: Terri Fester, MD;  Location: [facility] BIRTHING SUITES;  Service: Obstetrics;  Laterality: N/A;  EDD: 06/10/18   COLONOSCOPY     CYSTECTOMY  2002   pilonidal region   DIAGNOSTIC LAPAROSCOPY WITH REMOVAL OF ECTOPIC PREGNANCY Right 05/29/2020   Procedure: DIAGNOSTIC LAPAROSCOPY WITH REMOVAL OF ECTOPIC PREGNANCY;  Surgeon: Terri Fester, MD;  Location: [facility] OR;  Service: Gynecology;  Laterality: Right;   MOLE REMOVAL  UNILATERAL SALPINGECTOMY Right 05/29/2020   Procedure: UNILATERAL SALPINGECTOMY;  Surgeon: Terri Fester, MD;  Location: [facility] OR;  Service: Gynecology;  Laterality: Right;   WISDOM TOOTH EXTRACTION     Past Medical History:  Diagnosis Date   Anxiety    Asthma    Depression    Family history of breast cancer    Frank breech presentation 05/29/2018   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   Hemorrhoids    Hx of varicella    PONV (postoperative nausea and vomiting)    "little nauseated"   Postpartum care following cesarean delivery (6/14) 01/18/2016   BP 101/70   Pulse 92   Ht 5\' 3"  (1.6 m)   Wt 158 lb (71.7 kg)   SpO2 98%   BMI 27.99 kg/m   Opioid Risk Score:   Fall Risk Score:  `1  Depression screen PHQ 2/9     11/11/2023    1:32 PM 07/08/2023    2:11 PM 05/22/2023    9:24 AM 09/26/2022    9:37 AM 06/27/2022    9:20 AM  Depression screen PHQ 2/9  Decreased Interest 0 0 1 1 2   Down, Depressed, Hopeless 0 0 1 1 3   PHQ - 2 Score 0 0 2 2 5   Altered sleeping     2  Tired, decreased energy     1  Change in appetite     1  Feeling bad or failure about yourself      1  Trouble concentrating     1  Moving slowly or fidgety/restless     0  Suicidal thoughts     0   PHQ-9 Score     11  Difficult doing work/chores     Somewhat difficult     Review of Systems  Musculoskeletal:  Positive for back pain.  All other systems reviewed and are negative.      Objective:   Physical Exam   Constitution: Appropriate appearance for age. No apparent distress.  HEENT: PERRL, EOMI grossly intact.  Resp: CTAB. No rales, rhonchi, or wheezing. Cardio: RRR. No mumurs, rubs, or gallops. No peripheral edema. Abdomen: Nondistended.  Nontender.  No obvious distention. Psych: appropriate affect.  Happy, positive mood today.  Unchanged from prior exams.  Neuro: AAOx4. No apparent deficits Sensation intact throughout  MSK: 5/5 strength in bilateral upper and lower extremities on exam.  Gait normal.  No tenderness to palpation throughout bilateral thoracic and lumbar paraspinals, + PSIS pain bilaterally.  Ongoing tight, ropey texture in the bilateral trapezius muscles but improved from prior exams.   Complete AROM neck, back        Assessment & Plan:   SYMPHANY FLEISSNER is a 44 y.o. year old female  who  has a past medical history of Anxiety, Asthma, Depression, Family history of breast cancer, Frank breech presentation (05/29/2018), Gene mutation, Hemorrhoids, varicella, PONV (postoperative nausea and vomiting), and Postpartum care following cesarean delivery (6/14) (01/18/2016).   They are presenting to PM&R clinic for follow up related to fibromyalgia pain.  .  Fibromyalgia Today I refilled your lidoderm patches and low dose naltrexone; message me if you need refills on anything else. You can try salonpas if the lidoderm patches dont stick well   Message me when you need a new work note; she is benefitting from it still  Follow up in 6 months or sooner if needed. You're doing amazing!!!  Anxiety and depression Currently doing well  in outpatient program; will defer medication adjustments in this area to specialist providers.   Drug-induced  constipation Today I provided handouts on dietary education for constipation and recommend OTC miralax; if you need to you can cut back on baclofen to 5 mg dosing instead of 10 mg.

## 2023-11-11 NOTE — Patient Instructions (Signed)
 Today I refilled your lidoderm patches and low dose naltrexone; message me if you need refills on anything else. You can try salonpas if the lidoderm patches dont stick well   Today I provided handouts on dietary education for constipation and recommend OTC miralax; if you need to you can cut back on baclofen to 5 mg dosing instead of 10 mg.  Follow up in 6 months or sooner if needed. You're doing amazing!!!  Message me when you need a new work note; she is benefitting from it still

## 2023-12-03 ENCOUNTER — Encounter: Admitting: Physical Therapy

## 2023-12-04 ENCOUNTER — Ambulatory Visit: Payer: Managed Care, Other (non HMO) | Admitting: Gastroenterology

## 2023-12-04 ENCOUNTER — Encounter: Payer: Self-pay | Admitting: Gastroenterology

## 2023-12-04 VITALS — BP 110/64 | HR 57 | Ht 63.0 in | Wt 162.0 lb

## 2023-12-04 DIAGNOSIS — R1310 Dysphagia, unspecified: Secondary | ICD-10-CM | POA: Diagnosis not present

## 2023-12-04 DIAGNOSIS — K219 Gastro-esophageal reflux disease without esophagitis: Secondary | ICD-10-CM

## 2023-12-04 DIAGNOSIS — Z1509 Genetic susceptibility to other malignant neoplasm: Secondary | ICD-10-CM | POA: Diagnosis not present

## 2023-12-04 DIAGNOSIS — K648 Other hemorrhoids: Secondary | ICD-10-CM | POA: Diagnosis not present

## 2023-12-04 MED ORDER — FAMOTIDINE 40 MG PO TABS: 40.0000 mg | ORAL_TABLET | Freq: Two times a day (BID) | ORAL | 3 refills | Status: AC

## 2023-12-04 NOTE — Patient Instructions (Signed)
 We have sent the following medications to your pharmacy for you to pick up at your convenience: Famotidine  40 mg every 12 hours as needed.  _______________________________________________________  If your blood pressure at your visit was 140/90 or greater, please contact your primary care physician to follow up on this.  _______________________________________________________  If you are age 43 or older, your body mass index should be between 23-30. Your Body mass index is 28.7 kg/m. If this is out of the aforementioned range listed, please consider follow up with your Primary Care Provider.  If you are age 6 or younger, your body mass index should be between 19-25. Your Body mass index is 28.7 kg/m. If this is out of the aformentioned range listed, please consider follow up with your Primary Care Provider.   ________________________________________________________  The [facility] providers would like to encourage you to use MYCHART to communicate with providers for non-urgent requests or questions.  Due to long hold times on the telephone, sending your provider a message by [facility] may be a faster and more efficient way to get a response.  Please allow 48 business hours for a response.  Please remember that this is for non-urgent requests.  _______________________________________________________   It was a pleasure to see you today!  Thank you for trusting me with your gastrointestinal care!    Scott E.Cherryl Corona, MD

## 2023-12-04 NOTE — Progress Notes (Signed)
 Discussed the use of AI scribe software for clinical note transcription with the patient, who gave verbal consent to proceed.  HPI : Adrienne Williams is a 43 y.o. female with a known CHEK2 mutation who is referred to us  by Darnelle Elders, PA-C.   She was last seen by us  in November 2023 by Loa Riling with symptomatic hemorrhoids.  Today, she presents to discuss GERD symptoms. She experiences heartburn, described as a burning pain in the chest, particularly when lying down or after consuming foods like pizza. These symptoms have been present since her last pregnancy six years ago and have worsened in frequency over the past few years. The heartburn occurs anywhere from daily to weekly, with a recent improvement to once a week. She currently uses famotidine  as needed, which she finds effective. No nocturnal symptoms, but she experiences a fluid or acid taste in her mouth, especially when lying down.  She describes episodes of feeling like food, particularly liquids like fatty soups, would get stuck, causing a burning sensation and a choking feeling. These episodes have not occurred in months. She previously consumed ashwagandha in her tea regularly but has since stopped, noting an improvement in symptoms.  She mentions a history of stress, which she believes exacerbated her symptoms, and has participated in an intensive outpatient mental health program. She has gained weight, which she attributes partly to stress and possibly to increased food intake due to confusing hunger with heartburn symptoms.  She is currently seeing a therapist weekly to every two weeks.       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   Hemorrhoids    Hx of varicella    PONV (postoperative nausea and vomiting)    "little nauseated"   Postpartum care following cesarean delivery (6/14) 01/18/2016     Past Surgical History:  Procedure Laterality Date   BREAST BIOPSY Right 02/25/2023   CESAREAN SECTION N/A 01/18/2016   Procedure: Primary CESAREAN SECTION;  Surgeon: Marie-Lyne Lavoie, MD;  Location: [facility];  Service: Obstetrics;  Laterality: N/A;  EDD: 01/25/16   CESAREAN SECTION N/A 05/29/2018   Procedure: Repeat CESAREAN SECTION;  Surgeon: Terri Fester, MD;  Location: [facility] BIRTHING SUITES;  Service: Obstetrics;  Laterality: N/A;  EDD: 06/10/18   COLONOSCOPY     CYSTECTOMY  2002   pilonidal region   DIAGNOSTIC LAPAROSCOPY WITH REMOVAL OF ECTOPIC PREGNANCY Right 05/29/2020   Procedure: DIAGNOSTIC LAPAROSCOPY WITH REMOVAL OF ECTOPIC PREGNANCY;  Surgeon: Terri Fester, MD;  Location: [facility] OR;  Service: Gynecology;  Laterality: Right;   MOLE REMOVAL     UNILATERAL SALPINGECTOMY Right 05/29/2020   Procedure: UNILATERAL SALPINGECTOMY;  Surgeon: Terri Fester, MD;  Location: [facility] OR;  Service: Gynecology;  Laterality: Right;   WISDOM TOOTH EXTRACTION     Family History  Problem Relation Age of Onset   ADD / ADHD Brother    Breast cancer Maternal Grandmother 71   Cancer Maternal Grandmother        stomach, possible met from breast cancer   Hypothyroidism Paternal Grandmother  Atrial fibrillation Paternal Grandmother    Angina Paternal Grandfather    Hypertension Paternal Grandfather    Hyperlipidemia Paternal Grandfather    Colon polyps Paternal Grandfather    Colon cancer Neg Hx    Esophageal cancer Neg Hx    Social History   Tobacco Use   Smoking status: Never   Smokeless tobacco: Never  Vaping Use   Vaping status: Never Used  Substance Use Topics    Alcohol use: No   Drug use: No   Current Outpatient Medications  Medication Sig Dispense Refill   albuterol (VENTOLIN HFA) 108 (90 Base) MCG/ACT inhaler Inhale into the lungs.     Amphet-Dextroamphet 3-Bead ER 37.5 MG CP24 Take 1 capsule by mouth daily.     Azelastine HCl 137 MCG/SPRAY SOLN Place into both nostrils.     baclofen  (LIORESAL ) 10 MG tablet TAKE 1/2 TO 1 TABLET BY MOUTH THREE TIMES A DAY AS NEEDED FOR MUSCLE SPASMS 270 tablet 3   buPROPion  (WELLBUTRIN  XL) 300 MG 24 hr tablet Take 300 mg by mouth daily.     busPIRone (BUSPAR) 30 MG tablet Take 30 mg by mouth 2 (two) times daily.     cetirizine (ZYRTEC) 10 MG tablet Take by mouth.     diclofenac  Sodium (VOLTAREN ) 1 % GEL APPLY 4 GRAMS TOPICALLY FOUR TIMES A DAY AS NEEDED (FOR PAIN) 300 g 4   DULoxetine  (CYMBALTA ) 60 MG capsule Take 1 capsule (60 mg total) by mouth 2 (two) times daily. 180 capsule 3   EPINEPHrine 0.3 mg/0.3 mL IJ SOAJ injection SMARTSIG:0.3 Milligram(s) IM Once PRN     fluticasone  (FLONASE ) 50 MCG/ACT nasal spray SPRAY TWO SPRAYS IN THE AFFECTED NOSTRIL DAILY     fluticasone  furoate-vilanterol (BREO ELLIPTA) 200-25 MCG/ACT AEPB Inhale 1 puff into the lungs daily.     ibuprofen  (ADVIL ) 800 MG tablet      lamoTRIgine (LAMICTAL) 100 MG tablet Take 100 mg by mouth 2 (two) times daily.     lidocaine  (LIDODERM ) 5 % Place 3 patches onto the skin daily. Remove & Discard patch within 12 hours or as directed by MD 90 patch 2   LORazepam (ATIVAN) 1 MG tablet Take by mouth.     magnesium  oxide (MAG-OX) 400 (241.3 Mg) MG tablet Take 1 tablet (400 mg total) by mouth daily. (Patient taking differently: Take 800 mg by mouth daily.) 30 tablet 4   metoprolol succinate (TOPROL-XL) 50 MG 24 hr tablet Take 50 mg by mouth daily.     montelukast  (SINGULAIR ) 10 MG tablet Take 10 mg by mouth at bedtime.     Naltrexone  HCl, Pain, 4.5 MG CAPS Take 4.5 mg by mouth daily at 12 noon. 90 capsule 2   NON FORMULARY Allergy shots once every 2 weeks      Omega-3 Fatty Acids (FISH OIL) 1000 MG CAPS Take 1 capsule by mouth daily.     polyethylene glycol powder (GLYCOLAX /MIRALAX ) 17 GM/SCOOP powder Take 1 Container by mouth once. Every other day     spironolactone (ALDACTONE) 50 MG tablet 2 tablets every day by oral route.     tretinoin (RETIN-A) 0.05 % cream Apply 1 Application topically at bedtime.     zolpidem  (AMBIEN ) 10 MG tablet 1 tablet every day by oral route.     No current facility-administered medications for this visit.   Allergies  Allergen Reactions   Benadryl  [Diphenhydramine ] Other (See Comments)    Makes skin crawl - pt prefers not to take  Cat Dander Other (See Comments)   Dog Epithelium (Canis Lupus Familiaris) Other (See Comments)   Dust Mite Extract Other (See Comments)   Molds & Smuts Other (See Comments)   Other Other (See Comments)   Prednisone     Pt can not sleep - prefers not to take     Review of Systems: All systems reviewed and negative except where noted in HPI.    No results found.  Physical Exam: BP 110/64   Pulse (!) 57   Ht 5\' 3"  (1.6 m)   Wt 162 lb (73.5 kg)   BMI 28.70 kg/m  Constitutional: Pleasant,well-developed, Caucasian female in no acute distress. Neurological: Alert and oriented to person place and time. Skin: Skin is warm and dry. No rashes noted. Psychiatric: Normal mood and affect. Behavior is normal.  CBC    Component Value Date/Time   WBC 12.7 (H) 05/29/2020 2127   RBC 4.21 05/29/2020 2127   HGB 13.3 05/29/2020 2127   HCT 39.6 05/29/2020 2127   PLT 339 05/29/2020 2127   MCV 94.1 05/29/2020 2127   MCH 31.6 05/29/2020 2127   MCHC 33.6 05/29/2020 2127   RDW 12.8 05/29/2020 2127   LYMPHSABS 2.2 05/23/2020 1430   MONOABS 0.5 05/23/2020 1430   EOSABS 0.1 05/23/2020 1430   BASOSABS 0.0 05/23/2020 1430    CMP     Component Value Date/Time   NA 135 05/23/2020 1430   K 3.6 05/23/2020 1430   CL 102 05/23/2020 1430   CO2 24 05/23/2020 1430   GLUCOSE 131 (H)  05/23/2020 1430   BUN 9 05/23/2020 1430   CREATININE 0.75 05/23/2020 1430   CALCIUM 9.5 05/23/2020 1430   PROT 6.9 05/23/2020 1430   ALBUMIN 4.0 05/23/2020 1430   AST 15 05/23/2020 1430   ALT 16 05/23/2020 1430   ALKPHOS 47 05/23/2020 1430   BILITOT 0.4 05/23/2020 1430   GFRNONAA >60 05/23/2020 1430   GFRAA >60 05/09/2018 2144       Latest Ref Rng & Units 05/29/2020    9:27 PM 05/23/2020    2:30 PM 05/29/2018    6:10 AM  CBC EXTENDED  WBC 4.0 - 10.5 K/uL 12.7  11.2  18.5   RBC 3.87 - 5.11 MIL/uL 4.21  4.15  3.49   Hemoglobin 12.0 - 15.0 g/dL 59.5  63.8  75.6   HCT 36.0 - 46.0 % 39.6  39.0  32.8   Platelets 150 - 400 K/uL 339  320  252   NEUT# 1.7 - 7.7 K/uL  8.4    Lymph# 0.7 - 4.0 K/uL  2.2        ASSESSMENT AND PLAN:  43 year old female with several years of intermittent typical GERD symptoms, and transient dysphagia which resolved spontaneously several months ago.  Gastroesophageal reflux disease (GERD) Chronic GERD with symptoms of burning chest pain and acid taste in mouth present since last pregnancy six years ago. Symptoms have worsened in frequency, likely exacerbated by recent weight gain as well as stressors.  Current management includes intermittent famotidine  and a short course of Nexium, which provided relief. She is hesitant to use PPIs long-term. No red flag symptoms present, and swallowing issues have resolved, so no need for upper endoscopy at this time.  We discussed the pathophysiology of GERD and the principles of GERD management to include lifestyle modifications  such as dietary discretion (avoidance of alcohol, tobacco, caffeinated and carbonated beverages, spicy/greasy foods, citrus, peppermint/chocolate), weight loss if applicable, head of bed elevation andconsuming  last meal of day within 3 hours of bedtime; pharmacologic options to include PPIs, H2RAs and OTC antacids. - Provide dietary handout with GERD dietary recommendations - Prescribe famotidine   40 mg every 12 hours as needed - Advise on lifestyle modifications including smaller meals, avoiding eating 3-4 hours before bed, and weight management - Discuss potential need for upper endoscopy if swallowing issues recur  Hemorrhoids Intermittent bleeding from hemorrhoids with occasional bright red blood. No significant discomfort reported. Aware of potential treatment options, including banding, but not considering intervention currently. - Monitor symptoms and consider intervention if symptoms worsen     CHEK2 mutation/increased risk colon cancer - Repeat colonoscopy June 2028  Dallin Mccorkel E. Cherryl Corona, MD [facility]  Darnelle Elders, PA-C

## 2023-12-12 ENCOUNTER — Encounter: Payer: Self-pay | Admitting: Physical Therapy

## 2023-12-12 ENCOUNTER — Ambulatory Visit: Attending: Obstetrics and Gynecology | Admitting: Physical Therapy

## 2023-12-12 DIAGNOSIS — M542 Cervicalgia: Secondary | ICD-10-CM | POA: Insufficient documentation

## 2023-12-12 DIAGNOSIS — R279 Unspecified lack of coordination: Secondary | ICD-10-CM | POA: Diagnosis present

## 2023-12-12 DIAGNOSIS — R252 Cramp and spasm: Secondary | ICD-10-CM | POA: Diagnosis present

## 2023-12-12 DIAGNOSIS — M62838 Other muscle spasm: Secondary | ICD-10-CM | POA: Diagnosis present

## 2023-12-12 DIAGNOSIS — M546 Pain in thoracic spine: Secondary | ICD-10-CM | POA: Insufficient documentation

## 2023-12-12 DIAGNOSIS — M6281 Muscle weakness (generalized): Secondary | ICD-10-CM | POA: Diagnosis present

## 2023-12-12 NOTE — Therapy (Addendum)
 OUTPATIENT PHYSICAL THERAPY FEMALE PELVIC TREATMENT/ reeval   Patient Name: Adrienne Williams MRN: [id number] DOB:Dec 26, 1980, 43 y.o., female Today's Date: 12/12/2023  END OF SESSION:  PT End of Session - 12/12/23 0913     Visit Number 30    Date for PT Re-Evaluation 01/23/24    Authorization Type [employer]    PT Start Time 0845    PT Stop Time 0925    PT Time Calculation (min) 40 min    Activity Tolerance Patient tolerated treatment well    Behavior During Therapy [facility] for tasks assessed/performed                      Past Medical History:  Diagnosis Date   Anxiety    Asthma    Depression    Family history of breast cancer    Samuel Crock breech presentation 05/29/2018   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   Hemorrhoids    Hx of varicella    PONV (postoperative nausea and vomiting)    "little nauseated"   Postpartum care following cesarean delivery (6/14) 01/18/2016   Past Surgical History:  Procedure Laterality Date   BREAST BIOPSY Right 02/25/2023   CESAREAN SECTION N/A 01/18/2016   Procedure: Primary CESAREAN SECTION;  Surgeon: Percy Bracken, MD;  Location: [facility];  Service: Obstetrics;  Laterality: N/A;  EDD: 01/25/16   CESAREAN SECTION N/A 05/29/2018   Procedure: Repeat CESAREAN SECTION;  Surgeon: Terri Fester, MD;  Location: [facility]TH[facility] SU[facility];  Service: Obstetrics;  Laterality: N/A;  EDD: 06/10/18   COLONOSCOPY     CYSTECTOMY  2002   pilonidal region   DIAGNOSTIC LAPAROSCOPY WITH REMOVAL OF ECTOPIC PREGNANCY Right 05/29/2020   Procedure: DIAGNOSTIC LAPAROSCOPY WITH REMOVAL OF ECTOPIC PREGNANCY;  Surgeon: Terri Fester, MD;  Location: [facility] OR;  Service: Gynecology;  Laterality: Right;   MOLE REMOVAL     UNILATERAL SALPINGECTOMY Right 05/29/2020   Procedure: UNILATERAL SALPINGECTOMY;  Surgeon: Terri Fester, MD;  Location: [facility] OR;  Service: Gynecology;  Laterality: Right;   WISDOM TOOTH EXTRACTION     Patient Active  Problem List   Diagnosis Date Noted   Drug-induced constipation 11/11/2023   Genetic testing 09/02/2023   Poor sleep 05/22/2023   Neck pain, chronic 06/27/2022   Fibromyalgia 06/27/2022   Myofascial pain 06/27/2022   Internal hemorrhoids 06/13/2022   Rectal bleeding 06/13/2022   Family history of breast cancer 01/18/2022   Monoallelic mutation of CHEK2 gene in female patient 01/18/2022   Anxiety and depression 05/31/2018   Rh negative, maternal 05/31/2018   Samuel Crock breech presentation 05/29/2018   Status post repeat low transverse cesarean section 05/29/2018   Postpartum care following cesarean delivery (10/24) 05/29/2018    PCP: Darnelle Elders, PA-C PCP - General  REFERRING PROVIDER: Zora Hires, MD Ref Provider  REFERRING DIAG: N39.3 (ICD-10-CM) - Stress incontinence (female) (female)  THERAPY DIAG:  Muscle weakness (generalized)  Pain in thoracic spine  Cervicalgia  Unspecified lack of coordination  Other muscle spasm  Cramp and spasm  Rationale for Evaluation and Treatment: Rehabilitation  ONSET DATE: 2019  SUBJECTIVE:  Pt reports that her asthma is bothering her, she has not been bothering her, she has not been running as much, so leaking has been better.  Pt reports that she was dead after side stepping with black theraband for 2 days and after that she felt good for weeks Reports that she has had to cancel a few  appts d/t cost                                                                                                                    SUBJECTIVE STATEMENT:  Fluid intake: Yes: mostly water, seltzer, liquid IV   PAIN:  Are you having pain? Yes NPRS scale: 3/10 Pain location: External  Pain type: throbbing, tight, and tingling Pain description: constant, stabbing if super tight  Aggravating factors: walking or running, being on her feet, sitting, standing Relieving factors: diaphragmatic breathing, lidocaine  patches PRECAUTIONS:  None  RED FLAGS: None   WEIGHT BEARING RESTRICTIONS: No  FALLS:  Has patient fallen in last 6 months? No  LIVING ENVIRONMENT: Lives with: lives with their family Lives in: House/apartment Stairs: No Has following equipment at home: None  OCCUPATION: pharmacist- retail, very difficult right now  PLOF: Independent  PATIENT GOALS: to be able to relieve pain without having to spend a ton of time on it, has 2 young kids and a job. Pain is a priority. Rare that the leaking happens.   PERTINENT HISTORY:   Sexual abuse: No  BOWEL MOVEMENT: Pain with bowel movement: Yes when stuff is super tight Type of bowel movement:Strain Yes sometimes Fully empty rectum: Yes: when she goes Leakage: No only when the hemorrhoid is really bad, it was crazy, it' better now Pads: No Fiber supplement: No  URINATION: Pain with urination: No Fully empty bladder: No sometimes feels like she struggles Stream: Weak Urgency: No, used to Frequency: feels like she constantly pees  Leakage: Coughing, Sneezing, and Exercise, running Pads: Yes: and period underwear  INTERCOURSE: Pain with intercourse: no pain Ability to have vaginal penetration:  Yes: has not in a while Climax: yes Marinoff Scale: 2/3  PREGNANCY: Vaginal deliveries 0 Tearing No C-section deliveries 2 Currently pregnant No  PROLAPSE: Check with internal   OBJECTIVE:  Note: Objective measures were completed at Evaluation unless otherwise noted.   COGNITION: Overall cognitive status: Within functional limits for tasks assessed     SENSATION: Light touch: Appears intact Proprioception: Appears intact  MUSCLE LENGTH: Hamstrings: tight bilateral Thomas test: Right tight ; Left tight    POSTURE: rounded shoulders and forward head  PELVIC ALIGNMENT: even  LUMBARAROM/PROM:  A/PROM A/PROM  eval  Flexion limited  Extension limited  Right lateral flexion limited  Left lateral flexion   Right rotation   Left  rotation    (Blank rows = not tested)  LOWER EXTREMITY ROM:  Passive ROM Right eval Left eval  Hip flexion limited limited  Hip extension    Hip abduction limited limited  Hip adduction    Hip internal rotation    Hip external rotation    Knee flexion    Knee extension    Ankle dorsiflexion    Ankle plantarflexion    Ankle inversion    Ankle eversion     (Blank rows = not tested)  LOWER EXTREMITY MMT: 4/5 grossly overall  PALPATION:   General  upper chest  breathing and restrictions throughout abdomen,  Trigger points throughout glutes and abdomen- more on right,  Tight umbilical scars                External Perineal Exam wfl                             Internal Pelvic Floor decreased strength, high tone, tight and tender throughout Patient confirms identification and approves PT to assess internal pelvic floor and treatment Yes  PELVIC MMT:   MMT eval  Vaginal 2/5  Internal Anal Sphincter 5/5  External Anal Sphincter 5/5  Puborectalis   Diastasis Recti Yes- 1 finger above umbilicus  (Blank rows = not tested)        TONE: high  PROLAPSE: no TODAY'S TREATMENT:                                                                                                                              DATE: 12/12/2023   There ex- single leg leg press 70 lbs with transverse abdominis breath, single leg squat ( wobbles) 20 reps, education on lat press with transverse abdominis breath with minisquat to feel pelvic floor in the gym Manual- dry needling and STM bilateral rectus abdominis and C section and umbilical scar    Trigger Point Dry Needling  Subsequent Treatment: Instructions provided previously at initial dry needling treatment.   Patient Verbal Consent Given: Yes Education Handout Provided: Yes Muscles Treated: bilateral rectus abdominis and abdominal scar Electrical Stimulation Performed: no Treatment Response/Outcome: less spasm, pain   PATIENT EDUCATION:   Education details/ manual performed: scar massage and cupping Person educated: Patient Education method: Programmer, multimedia, Demonstration, and Handouts Education comprehension: verbalized understanding, returned demonstration, verbal cues required, tactile cues required, and needs further education    HOME EXERCISE PROGRAM:  Access Code: [email or web address] URL: [email or web address] Date: 10/31/2023 Prepared by: Jameson Mcburney Rudi Knippenberg  Exercises - Abdominal Press into [place]  - 1 x daily - 7 x weekly - 3 sets - 10 reps - Supine Bridge with Pelvic Floor Contraction  - 1 x daily - 7 x weekly - 3 sets - 10 reps - Quadruped Pelvic Floor Contraction with Opposite Arm and Leg Lift  - 1 x daily - 7 x weekly - 3 sets - 10 reps - Single Leg Squat with Counter Support  - 1 x daily - 7 x weekly - 3 sets - 10 reps - Side Stepping with Resistance at Ankles  - 1 x daily - 7 x weekly - 3 sets - 10 reps ASSESSMENT:  CLINICAL IMPRESSION: Pt with tightness and pain C section scar and abdominals. Did well with dry needling, manual release and exercises. She is stronger, goes to the gym, does 20 push ups easily, swims, rides horses, goes to massages, has recently gained some weight d/t impatient psych program where they sat for 6 hours a day and ate snacks. Less  leaking but not running as much. Knee valgus with single leg squat bilateral present. Will continue to benefit from PT.   OBJECTIVE IMPAIRMENTS: decreased activity tolerance, decreased coordination, decreased endurance, decreased mobility, decreased ROM, decreased strength, increased fascial restrictions, increased muscle spasms, impaired flexibility, and pain.   ACTIVITY LIMITATIONS: bending  PARTICIPATION LIMITATIONS: interpersonal relationship, occupation, and yard work  PERSONAL FACTORS: Time since onset of injury/illness/exacerbation are also affecting patient's functional outcome.   REHAB POTENTIAL: Good  CLINICAL DECISION MAKING:  Stable/uncomplicated  EVALUATION COMPLEXITY: Low   GOALS: updated 07/22/2023 Goals reviewed with patient? Yes  SHORT TERM GOALS: Target date: 06/10/2023 updated 09/16/2023 updated 11/28/2023      Pt will be independent with HEP.   Baseline: not  Goal status: met  2.  Pt will be able to run/workout for 30 minutes without leakage or discomfort  Baseline: leaking   Goal status: leakage is worse because she is not as tense  3.  Pt will be independent with diaphragmatic breathing and down training activities in order to improve pelvic floor relaxation.  Baseline: no Goal status: progressing  LONG TERM GOALS: Target date: 08/05/2023 - updated 09/16/2023 updated on 10/31/2023 for 01/23/2024        Pt will be independent with advanced HEP.   Baseline:  Goal status: met  2.  Pt will report decreased pelvic pain to max 3/10  Baseline:  Goal status: progressing  3.  Pt will report reduced pain abdominal scars to max 3/10 Baseline:  Goal status: met  4.  Pt will soak 0 pads/ day Baseline:  Goal status: progressing   PLAN:  PT FREQUENCY: 1-2x/week  PT DURATION: 8 weeks  PLANNED INTERVENTIONS: Therapeutic exercises, Therapeutic activity, Neuromuscular re-education, Balance training, Gait training, Patient/Family education, Self Care, Joint mobilization, Dry Needling, Electrical stimulation, Spinal manipulation, Spinal mobilization, scar mobilization, Biofeedback, and Manual therapy  PLAN FOR NEXT SESSION: core strengthening for SUI with running Chalisa Kobler, PT 12/12/23 9:16 AM

## 2023-12-18 ENCOUNTER — Encounter: Admitting: Physical Therapy

## 2023-12-19 ENCOUNTER — Other Ambulatory Visit: Payer: Self-pay | Admitting: General Surgery

## 2023-12-19 DIAGNOSIS — Z1501 Genetic susceptibility to malignant neoplasm of breast: Secondary | ICD-10-CM

## 2023-12-19 DIAGNOSIS — N6314 Unspecified lump in the right breast, lower inner quadrant: Secondary | ICD-10-CM

## 2024-01-02 ENCOUNTER — Encounter: Admitting: Physical Therapy

## 2024-01-06 ENCOUNTER — Ambulatory Visit: Admitting: Physical Therapy

## 2024-01-10 ENCOUNTER — Encounter: Payer: Self-pay | Admitting: Physical Therapy

## 2024-01-10 ENCOUNTER — Ambulatory Visit: Attending: Obstetrics and Gynecology | Admitting: Physical Therapy

## 2024-01-10 DIAGNOSIS — M546 Pain in thoracic spine: Secondary | ICD-10-CM | POA: Insufficient documentation

## 2024-01-10 DIAGNOSIS — R279 Unspecified lack of coordination: Secondary | ICD-10-CM | POA: Diagnosis present

## 2024-01-10 DIAGNOSIS — R252 Cramp and spasm: Secondary | ICD-10-CM | POA: Insufficient documentation

## 2024-01-10 DIAGNOSIS — M542 Cervicalgia: Secondary | ICD-10-CM | POA: Diagnosis present

## 2024-01-10 DIAGNOSIS — M62838 Other muscle spasm: Secondary | ICD-10-CM | POA: Insufficient documentation

## 2024-01-10 DIAGNOSIS — M6281 Muscle weakness (generalized): Secondary | ICD-10-CM | POA: Insufficient documentation

## 2024-01-10 NOTE — Therapy (Addendum)
 OUTPATIENT PHYSICAL THERAPY FEMALE PELVIC TREATMENT/ reeval/ later date discharge   Patient Name: Adrienne Williams MRN: [id number] DOB:22-May-1981, 43 y.o., female Today's Date: 01/10/2024  END OF SESSION:  PT End of Session - 01/10/24 0937     Visit Number 31    Date for PT Re-Evaluation 01/23/24    Authorization Type [employer]    PT Start Time 0845    PT Stop Time 0937    PT Time Calculation (min) 52 min    Activity Tolerance Patient tolerated treatment well    Behavior During Therapy [facility] for tasks assessed/performed                       Past Medical History:  Diagnosis Date   Anxiety    Asthma    Depression    Family history of breast cancer    Dempsey breech presentation 05/29/2018   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   Hemorrhoids    Hx of varicella    PONV (postoperative nausea and vomiting)    little nauseated   Postpartum care following cesarean delivery (6/14) 01/18/2016   Past Surgical History:  Procedure Laterality Date   BREAST BIOPSY Right 02/25/2023   CESAREAN SECTION N/A 01/18/2016   Procedure: Primary CESAREAN SECTION;  Surgeon: Percilla Burly, MD;  Location: [facility];  Service: Obstetrics;  Laterality: N/A;  EDD: 01/25/16   CESAREAN SECTION N/A 05/29/2018   Procedure: Repeat CESAREAN SECTION;  Surgeon: Barbette Knock, MD;  Location: [facility] SU[facility];  Service: Obstetrics;  Laterality: N/A;  EDD: 06/10/18   COLONOSCOPY     CYSTECTOMY  2002   pilonidal region   DIAGNOSTIC LAPAROSCOPY WITH REMOVAL OF ECTOPIC PREGNANCY Right 05/29/2020   Procedure: DIAGNOSTIC LAPAROSCOPY WITH REMOVAL OF ECTOPIC PREGNANCY;  Surgeon: Barbette Knock, MD;  Location: [facility] OR;  Service: Gynecology;  Laterality: Right;   MOLE REMOVAL     UNILATERAL SALPINGECTOMY Right 05/29/2020   Procedure: UNILATERAL SALPINGECTOMY;  Surgeon: Barbette Knock, MD;  Location: [facility] OR;  Service: Gynecology;  Laterality: Right;   WISDOM TOOTH EXTRACTION      Patient Active Problem List   Diagnosis Date Noted   Drug-induced constipation 11/11/2023   Genetic testing 09/02/2023   Poor sleep 05/22/2023   Neck pain, chronic 06/27/2022   Fibromyalgia 06/27/2022   Myofascial pain 06/27/2022   Internal hemorrhoids 06/13/2022   Rectal bleeding 06/13/2022   Family history of breast cancer 01/18/2022   Monoallelic mutation of CHEK2 gene in female patient 01/18/2022   Anxiety and depression 05/31/2018   Rh negative, maternal 05/31/2018   Dempsey breech presentation 05/29/2018   Status post repeat low transverse cesarean section 05/29/2018   Postpartum care following cesarean delivery (10/24) 05/29/2018    PCP: Katina Pfeiffer, PA-C PCP - General  REFERRING PROVIDER: Linnell Devere BRAVO, MD Ref Provider  REFERRING DIAG: N39.3 (ICD-10-CM) - Stress incontinence (female) (female)  THERAPY DIAG:  Pain in thoracic spine  Muscle weakness (generalized)  Cervicalgia  Unspecified lack of coordination  Cramp and spasm  Other muscle spasm  Rationale for Evaluation and Treatment: Rehabilitation  ONSET DATE: 2019  SUBJECTIVE:  Pt reports that she is trying to run, leaking is worse, she does not understand how. She would like dry needling today- dry needling in her abdomen helps with everything Allergies and works has been kicking her butt. Had a horseback ride recently that was just insane. Killed all this area, had to squat all  this time.  Pt reports that she had no pain with intercourse last time, but has been going through dry spell.                                                                                                                SUBJECTIVE STATEMENT:  Fluid intake: Yes: mostly water, seltzer, liquid IV   PAIN:  Are you having pain? Yes NPRS scale: 3/10 Pain location: External  Pain type: throbbing, tight, and tingling Pain description: constant, stabbing if super tight  Aggravating factors: walking or running, being  on her feet, sitting, standing Relieving factors: diaphragmatic breathing, lidocaine  patches PRECAUTIONS: None  RED FLAGS: None   WEIGHT BEARING RESTRICTIONS: No  FALLS:  Has patient fallen in last 6 months? No  LIVING ENVIRONMENT: Lives with: lives with their family Lives in: House/apartment Stairs: No Has following equipment at home: None  OCCUPATION: pharmacist- retail, very difficult right now  PLOF: Independent  PATIENT GOALS: to be able to relieve pain without having to spend a ton of time on it, has 2 young kids and a job. Pain is a priority. Rare that the leaking happens.   PERTINENT HISTORY:   Sexual abuse: No  BOWEL MOVEMENT: Pain with bowel movement: Yes when stuff is super tight Type of bowel movement:Strain Yes sometimes Fully empty rectum: Yes: when she goes Leakage: No only when the hemorrhoid is really bad, it was crazy, it' better now Pads: No Fiber supplement: No  URINATION: Pain with urination: No Fully empty bladder: No sometimes feels like she struggles Stream: Weak Urgency: No, used to Frequency: feels like she constantly pees  Leakage: Coughing, Sneezing, and Exercise, running Pads: Yes: and period underwear  INTERCOURSE: Pain with intercourse: no pain Ability to have vaginal penetration:  Yes: has not in a while Climax: yes Marinoff Scale: 2/3  PREGNANCY: Vaginal deliveries 0 Tearing No C-section deliveries 2 Currently pregnant No  PROLAPSE: No complaints  OBJECTIVE:  Note: Objective measures were completed at Evaluation unless otherwise noted.   COGNITION: Overall cognitive status: Within functional limits for tasks assessed     SENSATION: Light touch: Appears intact Proprioception: Appears intact  MUSCLE LENGTH: Hamstrings: tight bilateral Thomas test: Right tight ; Left tight    POSTURE: rounded shoulders and forward head  PELVIC ALIGNMENT: even  LUMBARAROM/PROM:  A/PROM A/PROM  eval  Flexion limited   Extension limited  Right lateral flexion limited  Left lateral flexion   Right rotation   Left rotation    (Blank rows = not tested)  LOWER EXTREMITY ROM:  Passive ROM Right eval Left eval  Hip flexion limited limited  Hip extension    Hip abduction limited limited  Hip adduction    Hip internal rotation    Hip external rotation    Knee flexion    Knee extension    Ankle dorsiflexion    Ankle plantarflexion    Ankle inversion    Ankle eversion     (Blank rows = not tested)  LOWER EXTREMITY  MMT: 4/5 grossly overall  PALPATION:   General  upper chest breathing and restrictions throughout abdomen,  Trigger points throughout glutes and abdomen- more on right,  Tight umbilical scars                External Perineal Exam wfl                             Internal Pelvic Floor decreased strength, high tone, tight and tender throughout Patient confirms identification and approves PT to assess internal pelvic floor and treatment Yes  PELVIC MMT:   MMT eval  Vaginal 2/5  Internal Anal Sphincter 5/5  External Anal Sphincter 5/5  Puborectalis   Diastasis Recti Yes- 1 finger above umbilicus  (Blank rows = not tested)        TONE: high  PROLAPSE: no TODAY'S TREATMENT:                                                                                                                              DATE: 01/10/2024 There ex- side stepping with green theraband and                   Single leg squat     Manual- dry needling and STM bilateral rectus abdominis, lumbar paraspinals and left UT and infraspinaus    Trigger Point Dry Needling  Subsequent Treatment: Instructions provided previously at initial dry needling treatment.   Patient Verbal Consent Given: Yes Education Handout Provided: Yes Muscles Treated: bilateral rectus abdominis and abdominal scar Electrical Stimulation Performed: no Treatment Response/Outcome: less spasm, pain   PATIENT EDUCATION:  Education  details/ manual performed: scar massage and cupping Person educated: Patient Education method: Programmer, multimedia, Demonstration, and Handouts Education comprehension: verbalized understanding, returned demonstration, verbal cues required, tactile cues required, and needs further education    HOME EXERCISE PROGRAM:  Access Code: [email or web address] URL: [email or web address] Date: 10/31/2023 Prepared by: Cori Jericho Alcorn  Exercises - Abdominal Press into [place]  - 1 x daily - 7 x weekly - 3 sets - 10 reps - Supine Bridge with Pelvic Floor Contraction  - 1 x daily - 7 x weekly - 3 sets - 10 reps - Quadruped Pelvic Floor Contraction with Opposite Arm and Leg Lift  - 1 x daily - 7 x weekly - 3 sets - 10 reps - Single Leg Squat with Counter Support  - 1 x daily - 7 x weekly - 3 sets - 10 reps - Side Stepping with Resistance at Ankles  - 1 x daily - 7 x weekly - 3 sets - 10 reps ASSESSMENT:  CLINICAL IMPRESSION: Pt with tightness and pain C section scar and trigger point in abdominals, lumbar paraspinals and left upper trap and infraspinatus. Did well with dry needling, manual release and education. Recently sick and could not do anything. Stayed in bed for 5 days She is overall stronger, rides horses, goes  to massages, has recently gained some weight d/t impatient psych program where they sat for 6 hours a day and ate snacks. Still leaking with running, reported that it is worse. Possibly to recent weight gain. Discussed adding single leg squats to HEP and returning to PT with focus on running. Knee valgus with single leg squat bilateral present. Will continue to benefit from PT. Extending POC.   OBJECTIVE IMPAIRMENTS: decreased activity tolerance, decreased coordination, decreased endurance, decreased mobility, decreased ROM, decreased strength, increased fascial restrictions, increased muscle spasms, impaired flexibility, and pain.   ACTIVITY LIMITATIONS: bending  PARTICIPATION LIMITATIONS:  interpersonal relationship, occupation, and yard work  PERSONAL FACTORS: Time since onset of injury/illness/exacerbation are also affecting patient's functional outcome.   REHAB POTENTIAL: Good  CLINICAL DECISION MAKING: Stable/uncomplicated  EVALUATION COMPLEXITY: Low   GOALS: updated 07/22/2023 Goals reviewed with patient? Yes  SHORT TERM GOALS: Target date: 06/10/2023 updated 09/16/2023 updated 11/28/2023      Pt will be independent with HEP.   Baseline: not  Goal status: met  2.  Pt will be able to run/workout for 30 minutes without leakage or discomfort  Baseline: leaking   Goal status: leakage is worse because she is not as tense  3.  Pt will be independent with diaphragmatic breathing and down training activities in order to improve pelvic floor relaxation.  Baseline: no Goal status: progressing  LONG TERM GOALS: Target date: 08/05/2023 - updated 09/16/2023 updated on 10/31/2023 for 01/23/2024- updated 07/11/2024        Pt will be independent with advanced HEP.   Baseline:  Goal status: met  2.  Pt will report decreased pelvic pain to max 3/10  Baseline:  Goal status: progressing  3.  Pt will report reduced pain abdominal scars to max 3/10 Baseline:  Goal status: met  4.  Pt will soak 0 pads/ day Baseline:  Goal status: progressing   PLAN:  PT FREQUENCY: 1-2x/week  PT DURATION: 6 months  PLANNED INTERVENTIONS: Therapeutic exercises, Therapeutic activity, Neuromuscular re-education, Balance training, Gait training, Patient/Family education, Self Care, Joint mobilization, Dry Needling, Electrical stimulation, Spinal manipulation, Spinal mobilization, scar mobilization, Biofeedback, and Manual therapy  PLAN FOR NEXT SESSION: core strengthening for SUI with running [place], PT 01/10/24 9:40 AM    PHYSICAL THERAPY DISCHARGE SUMMARY  Visits from Start of Care: 31   Patient agrees to discharge. Patient goals were partially met. Patient is being  discharged due to financial reasons.   Cori Florida, PT, DPT  [facility] [address] [place], [place] [address] Phone # [phone] Fax [phone]

## 2024-01-11 ENCOUNTER — Inpatient Hospital Stay: Admission: RE | Admit: 2024-01-11 | Source: Ambulatory Visit

## 2024-01-16 ENCOUNTER — Encounter: Admitting: Physical Therapy

## 2024-01-26 ENCOUNTER — Ambulatory Visit
Admission: RE | Admit: 2024-01-26 | Discharge: 2024-01-26 | Disposition: A | Discharge status: Home / Self Care | Source: Ambulatory Visit | Attending: General Surgery | Admitting: General Surgery

## 2024-01-26 DIAGNOSIS — Z1501 Genetic susceptibility to malignant neoplasm of breast: Secondary | ICD-10-CM

## 2024-01-26 DIAGNOSIS — N6314 Unspecified lump in the right breast, lower inner quadrant: Secondary | ICD-10-CM

## 2024-01-26 MED ORDER — GADOPICLENOL 0.5 MMOL/ML IV SOLN
7.0000 mL | Freq: Once | INTRAVENOUS | Status: AC | PRN
  Administered 2024-01-26: 7 mL via INTRAVENOUS

## 2024-02-25 ENCOUNTER — Ambulatory Visit: Admitting: Physical Therapy

## 2024-03-16 ENCOUNTER — Ambulatory Visit: Admitting: Physical Therapy

## 2024-04-11 ENCOUNTER — Other Ambulatory Visit: Payer: Self-pay | Admitting: Physical Medicine and Rehabilitation

## 2024-05-11 ENCOUNTER — Ambulatory Visit: Admitting: Physical Medicine and Rehabilitation

## 2024-06-03 ENCOUNTER — Encounter: Admitting: Physical Medicine and Rehabilitation

## 2024-06-26 ENCOUNTER — Encounter: Payer: Self-pay | Admitting: Physical Medicine and Rehabilitation

## 2024-07-09 ENCOUNTER — Encounter: Payer: Self-pay | Admitting: Physical Medicine and Rehabilitation

## 2024-07-09 MED ORDER — DULOXETINE HCL 60 MG PO CPEP: 60.0000 mg | ORAL_CAPSULE | Freq: Two times a day (BID) | ORAL | 3 refills | Status: AC

## 2024-07-21 NOTE — Progress Notes (Unsigned)
 Subjective:    Patient ID: Adrienne Williams, female    DOB: [birth year], 43 y.o.   MRN: [id number]  HPI   Pain Inventory Average Pain {NUMBERS; 0-10:5044} Pain Right Now {NUMBERS; 0-10:5044} My pain is {PAIN DESCRIPTION:21022940}  In the last 24 hours, has pain interfered with the following? General activity {NUMBERS; 0-10:5044} Relation with others {NUMBERS; 0-10:5044} Enjoyment of life {NUMBERS; 0-10:5044} What TIME of day is your pain at its worst? {time of day:24191} Sleep (in general) {BHH GOOD/FAIR/POOR:22877}  Pain is worse with: {ACTIVITIES:21022942} Pain improves with: {PAIN IMPROVES TPUY:78977056} Relief from Meds: {NUMBERS; 0-10:5044}  Family History  Problem Relation Age of Onset   ADD / ADHD Brother    Breast cancer Maternal Grandmother 31   Cancer Maternal Grandmother        stomach, possible met from breast cancer   Hypothyroidism Paternal Grandmother    Atrial fibrillation Paternal Grandmother    Angina Paternal Grandfather    Hypertension Paternal Grandfather    Hyperlipidemia Paternal Grandfather    Colon polyps Paternal Grandfather    Colon cancer Neg Hx    Esophageal cancer Neg Hx    Social History   Socioeconomic History   Marital status: Married    Spouse name: Not on file   Number of children: 2   Years of education: Not on file   Highest education level: Not on file  Occupational History   Occupation: Pharmacist  Tobacco Use   Smoking status: Never   Smokeless tobacco: Never  Vaping Use   Vaping status: Never Used  Substance and Sexual Activity   Alcohol use: No   Drug use: No   Sexual activity: Yes  Other Topics Concern   Not on file  Social History Narrative   Not on file   Social Drivers of Health   Tobacco Use: Low Risk  (05/18/2024)   Received from [facility]   Patient History    Smoking Tobacco Use: Never    Smokeless Tobacco Use: Never    Passive Exposure: Not on file  Financial Resource Strain:  Not on file  Food Insecurity: Low Risk (05/31/2023)   Received from [facility]   Epic    Within the past 12 months, you worried that your food would run out before you got money to buy more: Never true    Within the past 12 months, the food you bought just didn't last and you didn't have money to get more. : Never true  Transportation Needs: No Transportation Needs (05/31/2023)   Received from [facility]    In the past 12 months, has lack of reliable transportation kept you from medical appointments, meetings, work or from getting things needed for daily living? : No  Physical Activity: Not on file  Stress: Not on file  Social Connections: Not on file  Depression (PHQ2-9): Low Risk (11/11/2023)   Depression (PHQ2-9)    PHQ-2 Score: 0  Alcohol Screen: Not on file  Housing: Unknown (05/18/2024)   Received from [facility]   Epic    Unable to Pay for Housing in the Last Year: Not on file    Number of Times Moved in the Last Year: Not on file    At any time in the past 12 months, were you homeless or living in a shelter (including now)?: No  Utilities: Low Risk (05/31/2023)   Received from [facility]   Utilities    In the past 12  months has the electric, gas, oil, or water company threatened to shut off services in your home? : No  Health Literacy: Not on file   Past Surgical History:  Procedure Laterality Date   BREAST BIOPSY Right 02/25/2023   CESAREAN SECTION N/A 01/18/2016   Procedure: Primary CESAREAN SECTION;  Surgeon: Marie-Lyne Lavoie, MD;  Location: [facility];  Service: Obstetrics;  Laterality: N/A;  EDD: 01/25/16   CESAREAN SECTION N/A 05/29/2018   Procedure: Repeat CESAREAN SECTION;  Surgeon: Barbette Knock, MD;  Location: [facility];  Service: Obstetrics;  Laterality: N/A;  EDD: 06/10/18   COLONOSCOPY     CYSTECTOMY  2002   pilonidal region   DIAGNOSTIC LAPAROSCOPY WITH REMOVAL OF ECTOPIC PREGNANCY Right  05/29/2020   Procedure: DIAGNOSTIC LAPAROSCOPY WITH REMOVAL OF ECTOPIC PREGNANCY;  Surgeon: Barbette Knock, MD;  Location: [facility] OR;  Service: Gynecology;  Laterality: Right;   MOLE REMOVAL     UNILATERAL SALPINGECTOMY Right 05/29/2020   Procedure: UNILATERAL SALPINGECTOMY;  Surgeon: Barbette Knock, MD;  Location: [facility] OR;  Service: Gynecology;  Laterality: Right;   WISDOM TOOTH EXTRACTION     Past Surgical History:  Procedure Laterality Date   BREAST BIOPSY Right 02/25/2023   CESAREAN SECTION N/A 01/18/2016   Procedure: Primary CESAREAN SECTION;  Surgeon: Percilla Burly, MD;  Location: [facility];  Service: Obstetrics;  Laterality: N/A;  EDD: 01/25/16   CESAREAN SECTION N/A 05/29/2018   Procedure: Repeat CESAREAN SECTION;  Surgeon: Barbette Knock, MD;  Location: [facility];  Service: Obstetrics;  Laterality: N/A;  EDD: 06/10/18   COLONOSCOPY     CYSTECTOMY  2002   pilonidal region   DIAGNOSTIC LAPAROSCOPY WITH REMOVAL OF ECTOPIC PREGNANCY Right 05/29/2020   Procedure: DIAGNOSTIC LAPAROSCOPY WITH REMOVAL OF ECTOPIC PREGNANCY;  Surgeon: Barbette Knock, MD;  Location: [facility] OR;  Service: Gynecology;  Laterality: Right;   MOLE REMOVAL     UNILATERAL SALPINGECTOMY Right 05/29/2020   Procedure: UNILATERAL SALPINGECTOMY;  Surgeon: Barbette Knock, MD;  Location: [facility] OR;  Service: Gynecology;  Laterality: Right;   WISDOM TOOTH EXTRACTION     Past Medical History:  Diagnosis Date   Anxiety    Asthma    Depression    Family history of breast cancer    Frank breech presentation 05/29/2018   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   Hemorrhoids    Hx of varicella    PONV (postoperative nausea and vomiting)    little nauseated   Postpartum care following cesarean delivery (6/14) 01/18/2016   There were no vitals taken for this visit.  Opioid Risk Score:   Fall Risk Score:  `1  Depression screen PHQ 2/9     11/11/2023    1:32 PM 07/08/2023    2:11 PM 05/22/2023     9:24 AM 09/26/2022    9:37 AM 06/27/2022    9:20 AM  Depression screen PHQ 2/9  Decreased Interest 0 0 1 1 2   Down, Depressed, Hopeless 0 0 1 1 3   PHQ - 2 Score 0 0 2 2 5   Altered sleeping     2  Tired, decreased energy     1  Change in appetite     1  Feeling bad or failure about yourself      1  Trouble concentrating     1  Moving slowly or fidgety/restless     0  Suicidal thoughts     0  PHQ-9 Score  11   Difficult doing work/chores     Somewhat difficult     Data saved with a previous flowsheet row definition    Review of Systems     Objective:   Physical Exam        Assessment & Plan:

## 2024-07-22 ENCOUNTER — Encounter
Payer: PRIVATE HEALTH INSURANCE | Attending: Physical Medicine and Rehabilitation | Admitting: Physical Medicine and Rehabilitation

## 2024-07-22 ENCOUNTER — Encounter: Payer: Self-pay | Admitting: Physical Medicine and Rehabilitation

## 2024-07-22 VITALS — BP 108/73 | HR 75 | Ht 63.0 in | Wt 175.0 lb

## 2024-07-22 DIAGNOSIS — F419 Anxiety disorder, unspecified: Secondary | ICD-10-CM | POA: Insufficient documentation

## 2024-07-22 DIAGNOSIS — F32A Depression, unspecified: Secondary | ICD-10-CM | POA: Diagnosis not present

## 2024-07-22 DIAGNOSIS — M797 Fibromyalgia: Secondary | ICD-10-CM | POA: Diagnosis not present

## 2024-07-22 MED ORDER — LIDOCAINE 5 % EX PTCH: 3.0000 | MEDICATED_PATCH | CUTANEOUS | 6 refills | Status: AC

## 2024-07-22 MED ORDER — DULOXETINE HCL 60 MG PO CPEP: 60.0000 mg | ORAL_CAPSULE | Freq: Two times a day (BID) | ORAL | 5 refills | Status: AC

## 2024-07-22 NOTE — Patient Instructions (Addendum)
 Will send new LDN scripts to:  [email or web address] TEL: [phone] Standard Shipping: $3.95; Cost for 90 scored tablets: Any strength 0.5 mg - 4.5 mg is $46.95 5 mg is $48.95 6 mg is $59.95 Anything above 6 mg must be made into capsules; expect to pay closer to $80. Dosing up in 1.5 mg increments requires 3 mg tablets; 135 costs $67.50   Continue current medications   Follow up in 6 months

## 2025-02-03 ENCOUNTER — Encounter: Payer: PRIVATE HEALTH INSURANCE | Admitting: Physical Medicine and Rehabilitation
